# Patient Record
Sex: Female | Born: 1940 | Race: White | Hispanic: No | Marital: Married | Smoking: Never smoker
Health system: Southern US, Community
[De-identification: ages and names within clinical notes are randomized; demographics above are authoritative.]

## PROBLEM LIST (undated history)

## (undated) DIAGNOSIS — R519 Headache, unspecified: Secondary | ICD-10-CM

## (undated) DIAGNOSIS — R51 Headache: Secondary | ICD-10-CM

## (undated) DIAGNOSIS — Z801 Family history of malignant neoplasm of trachea, bronchus and lung: Secondary | ICD-10-CM

## (undated) DIAGNOSIS — F32A Depression, unspecified: Secondary | ICD-10-CM

## (undated) DIAGNOSIS — Z9221 Personal history of antineoplastic chemotherapy: Secondary | ICD-10-CM

## (undated) DIAGNOSIS — F329 Major depressive disorder, single episode, unspecified: Secondary | ICD-10-CM

## (undated) DIAGNOSIS — C50919 Malignant neoplasm of unspecified site of unspecified female breast: Secondary | ICD-10-CM

## (undated) DIAGNOSIS — I4891 Unspecified atrial fibrillation: Secondary | ICD-10-CM

## (undated) DIAGNOSIS — R32 Unspecified urinary incontinence: Secondary | ICD-10-CM

## (undated) DIAGNOSIS — I82409 Acute embolism and thrombosis of unspecified deep veins of unspecified lower extremity: Secondary | ICD-10-CM

## (undated) DIAGNOSIS — K219 Gastro-esophageal reflux disease without esophagitis: Secondary | ICD-10-CM

## (undated) DIAGNOSIS — K5790 Diverticulosis of intestine, part unspecified, without perforation or abscess without bleeding: Secondary | ICD-10-CM

## (undated) DIAGNOSIS — M5030 Other cervical disc degeneration, unspecified cervical region: Secondary | ICD-10-CM

## (undated) DIAGNOSIS — E079 Disorder of thyroid, unspecified: Secondary | ICD-10-CM

## (undated) DIAGNOSIS — F419 Anxiety disorder, unspecified: Secondary | ICD-10-CM

## (undated) DIAGNOSIS — Z800 Family history of malignant neoplasm of digestive organs: Secondary | ICD-10-CM

## (undated) DIAGNOSIS — E049 Nontoxic goiter, unspecified: Secondary | ICD-10-CM

## (undated) DIAGNOSIS — R0600 Dyspnea, unspecified: Secondary | ICD-10-CM

## (undated) DIAGNOSIS — M810 Age-related osteoporosis without current pathological fracture: Secondary | ICD-10-CM

## (undated) DIAGNOSIS — G4733 Obstructive sleep apnea (adult) (pediatric): Secondary | ICD-10-CM

## (undated) DIAGNOSIS — R42 Dizziness and giddiness: Secondary | ICD-10-CM

## (undated) DIAGNOSIS — N390 Urinary tract infection, site not specified: Secondary | ICD-10-CM

## (undated) DIAGNOSIS — I10 Essential (primary) hypertension: Secondary | ICD-10-CM

## (undated) DIAGNOSIS — C50911 Malignant neoplasm of unspecified site of right female breast: Secondary | ICD-10-CM

## (undated) DIAGNOSIS — G709 Myoneural disorder, unspecified: Secondary | ICD-10-CM

## (undated) HISTORY — DX: Dizziness and giddiness: R42

## (undated) HISTORY — DX: Age-related osteoporosis without current pathological fracture: M81.0

## (undated) HISTORY — PX: BUNIONECTOMY: SHX129

## (undated) HISTORY — PX: CHOLECYSTECTOMY: SHX55

## (undated) HISTORY — DX: Disorder of thyroid, unspecified: E07.9

## (undated) HISTORY — PX: THYROIDECTOMY: SHX17

## (undated) HISTORY — PX: HAND SURGERY: SHX662

## (undated) HISTORY — PX: COCHLEAR IMPLANT: SUR684

## (undated) HISTORY — DX: Diverticulosis of intestine, part unspecified, without perforation or abscess without bleeding: K57.90

## (undated) HISTORY — PX: PARTIAL COLECTOMY: SHX5273

## (undated) HISTORY — DX: Myoneural disorder, unspecified: G70.9

## (undated) HISTORY — DX: Family history of malignant neoplasm of digestive organs: Z80.0

## (undated) HISTORY — DX: Urinary tract infection, site not specified: N39.0

## (undated) HISTORY — DX: Gastro-esophageal reflux disease without esophagitis: K21.9

## (undated) HISTORY — PX: ABDOMINAL HYSTERECTOMY: SHX81

## (undated) HISTORY — DX: Nontoxic goiter, unspecified: E04.9

## (undated) HISTORY — DX: Unspecified atrial fibrillation: I48.91

## (undated) HISTORY — PX: EYE SURGERY: SHX253

## (undated) HISTORY — PX: FOOT SURGERY: SHX648

## (undated) HISTORY — DX: Family history of malignant neoplasm of trachea, bronchus and lung: Z80.1

## (undated) HISTORY — DX: Obstructive sleep apnea (adult) (pediatric): G47.33

## (undated) MED FILL — Zoledronic Acid IV Soln 5 MG/100ML: INTRAVENOUS | Qty: 100 | Status: AC

---
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
## 1997-03-13 DIAGNOSIS — Z9221 Personal history of antineoplastic chemotherapy: Secondary | ICD-10-CM

## 1997-03-13 DIAGNOSIS — C50911 Malignant neoplasm of unspecified site of right female breast: Secondary | ICD-10-CM

## 1997-03-13 DIAGNOSIS — C50919 Malignant neoplasm of unspecified site of unspecified female breast: Secondary | ICD-10-CM

## 1997-03-13 HISTORY — DX: Malignant neoplasm of unspecified site of unspecified female breast: C50.919

## 1997-03-13 HISTORY — PX: MASTECTOMY: SHX3

## 1997-03-13 HISTORY — DX: Personal history of antineoplastic chemotherapy: Z92.21

## 1997-03-13 HISTORY — DX: Malignant neoplasm of unspecified site of right female breast: C50.911

## 1998-03-13 DIAGNOSIS — Z9221 Personal history of antineoplastic chemotherapy: Secondary | ICD-10-CM

## 1998-03-13 HISTORY — DX: Personal history of antineoplastic chemotherapy: Z92.21

## 2003-12-12 ENCOUNTER — Ambulatory Visit: Payer: Self-pay | Admitting: Oncology

## 2004-03-13 HISTORY — PX: BREAST BIOPSY: SHX20

## 2004-04-10 ENCOUNTER — Emergency Department: Payer: Self-pay | Admitting: Unknown Physician Specialty

## 2004-04-22 ENCOUNTER — Ambulatory Visit: Payer: Self-pay | Admitting: General Surgery

## 2004-04-29 ENCOUNTER — Ambulatory Visit: Payer: Self-pay | Admitting: Psychiatry

## 2004-05-02 ENCOUNTER — Ambulatory Visit: Payer: Self-pay | Admitting: General Surgery

## 2004-07-08 ENCOUNTER — Ambulatory Visit: Payer: Self-pay | Admitting: Oncology

## 2004-07-11 ENCOUNTER — Ambulatory Visit: Payer: Self-pay | Admitting: Oncology

## 2004-12-05 ENCOUNTER — Ambulatory Visit: Payer: Self-pay | Admitting: General Surgery

## 2005-01-12 ENCOUNTER — Ambulatory Visit: Payer: Self-pay | Admitting: Oncology

## 2005-05-22 ENCOUNTER — Ambulatory Visit: Payer: Self-pay | Admitting: Internal Medicine

## 2005-06-12 ENCOUNTER — Ambulatory Visit: Payer: Self-pay | Admitting: General Surgery

## 2005-07-08 ENCOUNTER — Ambulatory Visit: Payer: Self-pay | Admitting: Internal Medicine

## 2005-08-18 ENCOUNTER — Ambulatory Visit: Payer: Self-pay | Admitting: Oncology

## 2006-02-16 ENCOUNTER — Ambulatory Visit: Payer: Self-pay | Admitting: Oncology

## 2006-04-16 ENCOUNTER — Inpatient Hospital Stay: Payer: Self-pay | Admitting: Internal Medicine

## 2006-06-13 ENCOUNTER — Ambulatory Visit: Payer: Self-pay | Admitting: General Surgery

## 2006-07-09 ENCOUNTER — Ambulatory Visit: Payer: Self-pay | Admitting: General Surgery

## 2006-07-25 ENCOUNTER — Encounter: Payer: Self-pay | Admitting: General Surgery

## 2007-03-14 ENCOUNTER — Ambulatory Visit: Payer: Self-pay | Admitting: Oncology

## 2007-03-28 ENCOUNTER — Ambulatory Visit: Payer: Self-pay | Admitting: Oncology

## 2007-04-14 ENCOUNTER — Ambulatory Visit: Payer: Self-pay | Admitting: Oncology

## 2007-06-14 ENCOUNTER — Ambulatory Visit: Payer: Self-pay | Admitting: Internal Medicine

## 2008-04-01 ENCOUNTER — Ambulatory Visit: Payer: Self-pay | Admitting: Ophthalmology

## 2008-04-14 ENCOUNTER — Ambulatory Visit: Payer: Self-pay | Admitting: Ophthalmology

## 2008-05-26 ENCOUNTER — Ambulatory Visit: Payer: Self-pay | Admitting: Ophthalmology

## 2008-06-18 ENCOUNTER — Ambulatory Visit: Payer: Self-pay | Admitting: Internal Medicine

## 2008-06-25 ENCOUNTER — Ambulatory Visit: Payer: Self-pay

## 2008-06-25 ENCOUNTER — Inpatient Hospital Stay: Payer: Self-pay | Admitting: Internal Medicine

## 2008-09-22 ENCOUNTER — Emergency Department: Payer: Self-pay | Admitting: Unknown Physician Specialty

## 2008-10-02 ENCOUNTER — Inpatient Hospital Stay: Payer: Self-pay | Admitting: Internal Medicine

## 2008-10-10 ENCOUNTER — Ambulatory Visit: Payer: Self-pay | Admitting: Unknown Physician Specialty

## 2008-10-31 ENCOUNTER — Emergency Department: Payer: Self-pay | Admitting: Emergency Medicine

## 2008-11-10 ENCOUNTER — Ambulatory Visit: Payer: Self-pay | Admitting: Internal Medicine

## 2009-01-22 ENCOUNTER — Ambulatory Visit: Payer: Self-pay | Admitting: Orthopedic Surgery

## 2009-01-25 ENCOUNTER — Ambulatory Visit: Payer: Self-pay | Admitting: Orthopedic Surgery

## 2009-06-08 ENCOUNTER — Ambulatory Visit: Payer: Self-pay | Admitting: Urology

## 2009-07-06 ENCOUNTER — Ambulatory Visit: Payer: Self-pay | Admitting: Internal Medicine

## 2009-09-27 ENCOUNTER — Inpatient Hospital Stay: Payer: Self-pay | Admitting: Internal Medicine

## 2009-09-29 ENCOUNTER — Inpatient Hospital Stay: Payer: Self-pay | Admitting: Psychiatry

## 2010-02-01 ENCOUNTER — Ambulatory Visit: Payer: Self-pay | Admitting: Urology

## 2010-05-22 ENCOUNTER — Emergency Department: Payer: Self-pay | Admitting: Emergency Medicine

## 2010-07-12 ENCOUNTER — Ambulatory Visit: Payer: Self-pay | Admitting: Internal Medicine

## 2010-09-12 ENCOUNTER — Emergency Department: Payer: Self-pay | Admitting: Emergency Medicine

## 2011-02-03 ENCOUNTER — Ambulatory Visit: Payer: Self-pay | Admitting: Internal Medicine

## 2011-03-23 ENCOUNTER — Ambulatory Visit: Payer: Self-pay | Admitting: Unknown Physician Specialty

## 2011-03-27 LAB — PATHOLOGY REPORT

## 2011-04-02 ENCOUNTER — Emergency Department: Payer: Self-pay | Admitting: Unknown Physician Specialty

## 2011-04-02 LAB — CBC WITH DIFFERENTIAL/PLATELET
Basophil #: 0 10*3/uL (ref 0.0–0.1)
Basophil %: 0.5 %
Eosinophil #: 0.1 10*3/uL (ref 0.0–0.7)
HCT: 39.4 % (ref 35.0–47.0)
HGB: 13.3 g/dL (ref 12.0–16.0)
Lymphocyte #: 2.3 10*3/uL (ref 1.0–3.6)
MCH: 28.6 pg (ref 26.0–34.0)
MCHC: 33.7 g/dL (ref 32.0–36.0)
Monocyte #: 0.7 10*3/uL (ref 0.0–0.7)
Neutrophil #: 4.4 10*3/uL (ref 1.4–6.5)

## 2011-04-02 LAB — COMPREHENSIVE METABOLIC PANEL
Anion Gap: 10 (ref 7–16)
BUN: 13 mg/dL (ref 7–18)
Bilirubin,Total: 0.4 mg/dL (ref 0.2–1.0)
Calcium, Total: 8.9 mg/dL (ref 8.5–10.1)
Chloride: 106 mmol/L (ref 98–107)
Co2: 24 mmol/L (ref 21–32)
Creatinine: 0.74 mg/dL (ref 0.60–1.30)
EGFR (African American): >60
Potassium: 4.3 mmol/L (ref 3.5–5.1)
SGOT(AST): 24 U/L (ref 15–37)
SGPT (ALT): 24 U/L
Sodium: 140 mmol/L (ref 136–145)
Total Protein: 7 g/dL (ref 6.4–8.2)

## 2011-04-02 LAB — URINALYSIS, COMPLETE
Bacteria: NONE SEEN
Blood: NEGATIVE
Glucose,UR: NEGATIVE mg/dL (ref 0–75)
Ketone: NEGATIVE
Protein: NEGATIVE
Specific Gravity: 1.016 (ref 1.003–1.030)
WBC UR: 1 /HPF (ref 0–5)

## 2011-04-02 LAB — MAGNESIUM: Magnesium: 1.8 mg/dL

## 2011-04-02 LAB — LIPASE, BLOOD: Lipase: 76 U/L (ref 73–393)

## 2011-07-13 ENCOUNTER — Ambulatory Visit: Payer: Self-pay | Admitting: Internal Medicine

## 2011-08-24 ENCOUNTER — Ambulatory Visit: Payer: Self-pay | Admitting: Physical Medicine and Rehabilitation

## 2011-11-16 DIAGNOSIS — R351 Nocturia: Secondary | ICD-10-CM | POA: Insufficient documentation

## 2011-11-16 DIAGNOSIS — M5430 Sciatica, unspecified side: Secondary | ICD-10-CM | POA: Insufficient documentation

## 2011-11-16 DIAGNOSIS — N8111 Cystocele, midline: Secondary | ICD-10-CM | POA: Insufficient documentation

## 2011-11-16 DIAGNOSIS — N393 Stress incontinence (female) (male): Secondary | ICD-10-CM | POA: Insufficient documentation

## 2011-11-16 DIAGNOSIS — N3642 Intrinsic sphincter deficiency (ISD): Secondary | ICD-10-CM | POA: Insufficient documentation

## 2011-11-16 DIAGNOSIS — R339 Retention of urine, unspecified: Secondary | ICD-10-CM | POA: Insufficient documentation

## 2011-11-16 DIAGNOSIS — N3941 Urge incontinence: Secondary | ICD-10-CM | POA: Insufficient documentation

## 2012-03-19 ENCOUNTER — Ambulatory Visit: Payer: Self-pay | Admitting: Internal Medicine

## 2012-07-15 ENCOUNTER — Ambulatory Visit: Payer: Self-pay | Admitting: Internal Medicine

## 2012-10-12 ENCOUNTER — Emergency Department: Payer: Self-pay | Admitting: Unknown Physician Specialty

## 2012-10-12 LAB — URINALYSIS, COMPLETE
Blood: NEGATIVE
Glucose,UR: NEGATIVE mg/dL (ref 0–75)
Ketone: NEGATIVE
Leukocyte Esterase: NEGATIVE
Ph: 9 (ref 4.5–8.0)
RBC,UR: <1 /HPF (ref 0–5)

## 2012-10-12 LAB — CBC
HCT: 43 % (ref 35.0–47.0)
MCH: 28.5 pg (ref 26.0–34.0)
MCHC: 34.7 g/dL (ref 32.0–36.0)
Platelet: 263 10*3/uL (ref 150–440)
RDW: 13.3 % (ref 11.5–14.5)

## 2012-10-12 LAB — COMPREHENSIVE METABOLIC PANEL
Albumin: 3.9 g/dL (ref 3.4–5.0)
Alkaline Phosphatase: 89 U/L (ref 50–136)
Anion Gap: 8 (ref 7–16)
BUN: 9 mg/dL (ref 7–18)
Bilirubin,Total: 0.8 mg/dL (ref 0.2–1.0)
Calcium, Total: 9.2 mg/dL (ref 8.5–10.1)
Chloride: 108 mmol/L — ABNORMAL HIGH (ref 98–107)
Co2: 25 mmol/L (ref 21–32)
Creatinine: 0.79 mg/dL (ref 0.60–1.30)
EGFR (African American): >60
EGFR (Non-African Amer.): >60
Glucose: 111 mg/dL — ABNORMAL HIGH (ref 65–99)
Osmolality: 281 (ref 275–301)
Sodium: 141 mmol/L (ref 136–145)

## 2012-10-12 LAB — MAGNESIUM: Magnesium: 1.8 mg/dL

## 2012-11-09 ENCOUNTER — Observation Stay: Payer: Self-pay | Admitting: Internal Medicine

## 2012-11-09 LAB — URINALYSIS, COMPLETE
Bilirubin,UR: NEGATIVE
Blood: NEGATIVE
Glucose,UR: NEGATIVE mg/dL (ref 0–75)
Ketone: NEGATIVE
Leukocyte Esterase: NEGATIVE
Protein: NEGATIVE
RBC,UR: NONE SEEN /HPF (ref 0–5)
Specific Gravity: 1.012 (ref 1.003–1.030)
Squamous Epithelial: NONE SEEN
WBC UR: <1 /HPF (ref 0–5)

## 2012-11-09 LAB — HEPATIC FUNCTION PANEL A (ARMC)
Albumin: 3.4 g/dL (ref 3.4–5.0)
Alkaline Phosphatase: 78 U/L (ref 50–136)
Bilirubin, Direct: <0.1 mg/dL (ref 0.00–0.20)
SGOT(AST): 27 U/L (ref 15–37)
SGPT (ALT): 21 U/L (ref 12–78)
Total Protein: 6.9 g/dL (ref 6.4–8.2)

## 2012-11-09 LAB — BASIC METABOLIC PANEL
BUN: 13 mg/dL (ref 7–18)
Co2: 24 mmol/L (ref 21–32)
EGFR (Non-African Amer.): >60
Glucose: 108 mg/dL — ABNORMAL HIGH (ref 65–99)
Sodium: 140 mmol/L (ref 136–145)

## 2012-11-09 LAB — TSH: Thyroid Stimulating Horm: 0.275 u[IU]/mL — ABNORMAL LOW

## 2012-11-09 LAB — DIFFERENTIAL
Basophil #: 0.1 10*3/uL (ref 0.0–0.1)
Basophil %: 0.9 %
Eosinophil %: 1.9 %
Lymphocyte #: 2.8 10*3/uL (ref 1.0–3.6)
Monocyte #: 0.7 x10 3/mm (ref 0.2–0.9)
Neutrophil #: 4.1 10*3/uL (ref 1.4–6.5)
Neutrophil %: 51.8 %

## 2012-11-09 LAB — CBC
HCT: 38.4 % (ref 35.0–47.0)
HGB: 13.3 g/dL (ref 12.0–16.0)
MCHC: 34.6 g/dL (ref 32.0–36.0)
MCV: 82 fL (ref 80–100)
Platelet: 247 10*3/uL (ref 150–440)
RDW: 13.5 % (ref 11.5–14.5)
WBC: 7.8 10*3/uL (ref 3.6–11.0)

## 2012-11-10 ENCOUNTER — Ambulatory Visit: Payer: Self-pay | Admitting: Neurology

## 2012-11-12 LAB — BASIC METABOLIC PANEL
Chloride: 106 mmol/L (ref 98–107)
Creatinine: 0.78 mg/dL (ref 0.60–1.30)
Glucose: 106 mg/dL — ABNORMAL HIGH (ref 65–99)
Potassium: 3.4 mmol/L — ABNORMAL LOW (ref 3.5–5.1)
Sodium: 140 mmol/L (ref 136–145)

## 2012-11-12 LAB — PLATELET COUNT: Platelet: 233 10*3/uL (ref 150–440)

## 2012-11-14 LAB — STOOL CULTURE

## 2012-11-16 LAB — PLATELET COUNT: Platelet: 238 10*3/uL (ref 150–440)

## 2013-08-04 ENCOUNTER — Emergency Department: Payer: Self-pay | Admitting: Emergency Medicine

## 2013-08-04 LAB — URINALYSIS, COMPLETE
BACTERIA: NONE SEEN
Bilirubin,UR: NEGATIVE
Blood: NEGATIVE
GLUCOSE, UR: NEGATIVE mg/dL (ref 0–75)
Ketone: NEGATIVE
LEUKOCYTE ESTERASE: NEGATIVE
Nitrite: NEGATIVE
PH: 6 (ref 4.5–8.0)
Protein: NEGATIVE
Specific Gravity: 1.005 (ref 1.003–1.030)
Squamous Epithelial: <1
WBC UR: 2 /HPF (ref 0–5)

## 2013-08-04 LAB — COMPREHENSIVE METABOLIC PANEL
ALBUMIN: 3.6 g/dL (ref 3.4–5.0)
ALT: 21 U/L (ref 12–78)
ANION GAP: 13 (ref 7–16)
Alkaline Phosphatase: 76 U/L
BILIRUBIN TOTAL: 0.6 mg/dL (ref 0.2–1.0)
BUN: 13 mg/dL (ref 7–18)
CALCIUM: 8.8 mg/dL (ref 8.5–10.1)
CREATININE: 0.84 mg/dL (ref 0.60–1.30)
Chloride: 108 mmol/L — ABNORMAL HIGH (ref 98–107)
Co2: 19 mmol/L — ABNORMAL LOW (ref 21–32)
EGFR (Non-African Amer.): >60
Glucose: 131 mg/dL — ABNORMAL HIGH (ref 65–99)
Osmolality: 281 (ref 275–301)
Potassium: 3.7 mmol/L (ref 3.5–5.1)
SGOT(AST): 23 U/L (ref 15–37)
Sodium: 140 mmol/L (ref 136–145)
Total Protein: 7.1 g/dL (ref 6.4–8.2)

## 2013-08-04 LAB — CBC
HCT: 42.3 % (ref 35.0–47.0)
HGB: 14 g/dL (ref 12.0–16.0)
MCH: 29 pg (ref 26.0–34.0)
MCHC: 33.2 g/dL (ref 32.0–36.0)
MCV: 87 fL (ref 80–100)
Platelet: 233 10*3/uL (ref 150–440)
RBC: 4.84 10*6/uL (ref 3.80–5.20)
RDW: 13.5 % (ref 11.5–14.5)
WBC: 8.3 10*3/uL (ref 3.6–11.0)

## 2013-08-04 LAB — TROPONIN I: Troponin-I: <0.02 ng/mL

## 2013-08-04 LAB — PRO B NATRIURETIC PEPTIDE: B-Type Natriuretic Peptide: 53 pg/mL (ref 0–125)

## 2013-09-23 ENCOUNTER — Ambulatory Visit: Payer: Self-pay | Admitting: Internal Medicine

## 2013-10-06 DIAGNOSIS — M179 Osteoarthritis of knee, unspecified: Secondary | ICD-10-CM | POA: Insufficient documentation

## 2013-10-06 DIAGNOSIS — M5416 Radiculopathy, lumbar region: Secondary | ICD-10-CM | POA: Insufficient documentation

## 2013-10-06 DIAGNOSIS — M5136 Other intervertebral disc degeneration, lumbar region: Secondary | ICD-10-CM | POA: Insufficient documentation

## 2013-10-06 DIAGNOSIS — M5116 Intervertebral disc disorders with radiculopathy, lumbar region: Secondary | ICD-10-CM | POA: Insufficient documentation

## 2013-10-06 DIAGNOSIS — M1710 Unilateral primary osteoarthritis, unspecified knee: Secondary | ICD-10-CM | POA: Insufficient documentation

## 2014-04-16 DIAGNOSIS — R42 Dizziness and giddiness: Secondary | ICD-10-CM | POA: Insufficient documentation

## 2014-04-22 ENCOUNTER — Ambulatory Visit: Payer: Self-pay | Admitting: Internal Medicine

## 2014-06-22 DIAGNOSIS — N390 Urinary tract infection, site not specified: Secondary | ICD-10-CM | POA: Insufficient documentation

## 2014-06-22 DIAGNOSIS — R42 Dizziness and giddiness: Secondary | ICD-10-CM | POA: Insufficient documentation

## 2014-06-22 DIAGNOSIS — F32A Depression, unspecified: Secondary | ICD-10-CM | POA: Insufficient documentation

## 2014-06-22 DIAGNOSIS — F329 Major depressive disorder, single episode, unspecified: Secondary | ICD-10-CM | POA: Insufficient documentation

## 2014-06-22 DIAGNOSIS — C50919 Malignant neoplasm of unspecified site of unspecified female breast: Secondary | ICD-10-CM | POA: Insufficient documentation

## 2014-06-22 DIAGNOSIS — I10 Essential (primary) hypertension: Secondary | ICD-10-CM | POA: Insufficient documentation

## 2014-06-22 DIAGNOSIS — G4730 Sleep apnea, unspecified: Secondary | ICD-10-CM | POA: Insufficient documentation

## 2014-06-22 DIAGNOSIS — K219 Gastro-esophageal reflux disease without esophagitis: Secondary | ICD-10-CM | POA: Insufficient documentation

## 2014-07-03 NOTE — Consult Note (Signed)
PATIENT NAME:  Breanna Zhang, HAUBNER MR#:  [id number] DATE OF BIRTH:  1940-08-19  DATE OF CONSULTATION:  11/10/2012  CONSULTING PHYSICIAN:  Leotis Pain, MD  REASON FOR NEUROLOGICAL CONSULTATION: Status post syncopal episode and tremors, rule out seizure activity.   HISTORY OF PRESENT ILLNESS: Obtained from patient's chart, patient herself, and patient's husband, who is at bedside. This is a 74 year old female with past medical history of diverticulitis by history of colonic resection, recurrent episodes of dizziness, occasional chest pain, suspected to be anxiety-provoking, came in with what is suspected to be syncopal episode. For the past 4 days or so, patient has been having increased amount of liquidy stools. Yesterday morning after a bowel movement, patient stood up and, as per husband, her legs gave out and  patient was on the floor. Had difficulty getting up. It was unclear if there was any loss of consciousness, but patient's husband does state that he thinks she was awake, but just was too weak to stand up. There was some minimal jerking motion of both of her hands and legs, but it was not consistent. There was no postictal state. No tongue biting. No urinary incontinence. No further suspicion of seizure activity. Upon admission to the Emergency Department, patient received IV hydration and she appears to be currently back to her baseline.   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   HOME MEDICATIONS: Include alprazolam, amlodipine, atorvastatin, Synthroid.   PAST SURGICAL HISTORY: Resection of the colonic mass, cochlear implants, pubovaginal sling, history of bladder cancer, thyroidectomy, cholecystectomy.   ALLERGIES:  Include PENICILLIN.  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  FAMILY HISTORY:  Contributive for cardiac disease, colon cancer, lung cancer and diabetes.   SOCIAL HISTORY: She is married, lives with her husband.  REVIEW OF SYSTEMS: Generalized weakness. Positive for diffuse pain. No history of glaucoma, cataracts. No shortness of breath. No chest pain. Mild diffuse abdominal pain. No weakness on one side of the body compared to the other. Positive history for anxiety and depression. Positive history for panic attacks. No heat or cold intolerance   LABS:  The patient's laboratory workup includes a UA that was negative for urinary tract infection. Her LFTs are unremarkable. White blood cell count is 7.8, hemoglobin 13.3, hematocrit 38.4, platelet count is 247. Glucose 108, BUN 13, creatinine is 0.84, sodium is 140, potassium is 3.8, chloride 109. Troponin is 0.2.   PHYSICAL EXAMINATION: VITAL SIGNS: Temperature 98.7, pulse 79, respirations 18, blood pressure 109/68, pulse ox  is 96%.   NEUROLOGIC: The patient was able to tell me the place, time, and her husband's name. Cranial nerve examination: Extraocular movements intact. Pupils 3 to 2, reactive bilaterally. Facial sensation intact, even though there were some anxiety-provoking facial movements similar to tardive dyskinesia. Tongue is midline. Uvula elevates symmetrically. Shoulder shrug intact. Motor appears to be 5 out of 5 bilateral upper and lower extremities. Sensation is intact to light touch and temperature. Reflexes intact. Coordination: Finger-to-nose intact. Gait not assessed.   IMPRESSION:  A 74 year old female with chronic history of anxiety, depression, panic attacks, presenting with a 4-day history of increased liquidy bowel movements, status post what appears to be a syncopal event and some tremor. As per husband, patient has frequent tremor of her upper extremities, specifically  when she appears to be more anxious and  around the times of her panic attacks. There is no focal neurological weakness.   PLAN:  Continue hydration, p.r.n. meclizine if needed for dizziness. Her diarrhea is improving. Continue hydration. Do not believe this is seizure activity. Would not do an EEG. No further neurological testing. No antiepileptic drugs at this time. I believe the tremor that patient sustains on and off is anxiety-related, related to her panic attacks. She should follow up with Psychiatry as an outpatient.   Thank you, it was a pleasure seeing this patient.    ____________________________ Leotis Pain, MD yz:mr D: 11/10/2012 12:43:38 ET T: 11/10/2012 19:38:21 ET JOB#: [id number]  cc: Leotis Pain, MD, <Dictator> Leotis Pain MD ELECTRONICALLY SIGNED 11/25/2012 12:12

## 2014-07-03 NOTE — H&P (Signed)
PATIENT NAME:  Breanna Zhang, Breanna Zhang MR#:  [id number] DATE OF BIRTH:  [birth year]  DATE OF ADMISSION:  11/09/2012  The patient is being admitted under observation status.   CHIEF COMPLAINT: Dizziness with syncope and diarrhea.   HISTORY OF PRESENT ILLNESS: Ms. Teters is a 74 year old white female with a history of diverticulitis and prior colonic resection, recurrent episodes of dizziness and chest pain with prior work-up, who presented today with a several week history of vague left upper quadrant pain and increased bowel movements of an explosive liquid nature. The patient states that for the last 3 to 4 days, she has been having 4 to 5 liquid stools per day, which have left her very weak. She states her last stool was this morning and afterwards, she felt too weak to walk without assistance. She had her husband assist her in coming back into the house after being outside for a few minutes and had a witnessed syncopal event. The patient's husband is present and stated her legs went out from under her and she fell to the carpet and was unresponsive for about 10 minutes, until EMS arrived and put some oxygen on her and then she woke up very readily and was not confused. He did see some jerking motion of both her hands and legs but nothing that suggested seizure activity, specifically, no loss of bowel or bladder function. The patient was given a liter of IV fluids in the ED and found to be too weak to sit up without assistance. This was attempted during an attempted trial of orthostatics but again, the patient was too weak to sit up without maximal assistance.   PRIMARY CARE PHYSICIAN: Dr. Caryl Comes.   PAST MEDICAL HISTORY: 1.  History of recurrent chest pain and dizziness, with prior cardiology work-up negative.  2.  History of DVT secondary to lower extremity injury.  3.  History of GERD.  4.  Hypothyroidism.  5.  History of generalized anxiety and depression, with admission to behavioral health in 2011 for  suicidal ideation.  6.  Hearing loss, status post cochlear implant.   CURRENT MEDICATIONS: Alprazolam 0.25 mg 1 to 2 tablets once a day, amlodipine 5 mg daily, atorvastatin 40 mg in the evening, B12 at 500 mcg sublingual daily, duloxetine 60 mg daily, pantoprazole 40 mg twice daily, Synthroid 100 mcg once daily.   PAST SURGICAL HISTORY: Includes: 1.  Resection of a colonic mass, which turned out to be a diverticulum.  2.  Cochlear implant.  3.  Pubovaginal sling for stress urinary incontinence; this was in 2011.  4.  History of breast cancer, status post right mastectomy in 1999.  5.  Thyroidectomy.  6.  Cholecystectomy.   ALLERGIES: PENICILLIN WHICH CAUSES A RASH, LATEX AND TAPE WHICH CAUSE BLISTERS AND RASH, AND IMIPRAMINE WHICH CAUSES SORES IN HER MOUTH AND TINNITUS.   LAST HOSPITALIZATION: July 2011 for behavioral health transfer for suicidal ideation with psychosis.   FAMILY HISTORY: She has a brother who died early of heart disease. There is a family history of colon cancer as well lung cancer and diabetes.   SOCIAL HISTORY: She is married. She has no history of alcohol or tobacco abuse.   REVIEW OF SYSTEMS: Positive for pain, fatigue and weakness lasting several weeks. No visual changes. No history of glaucoma or cataracts. No history of seasonal rhinitis, snoring, postnasal drip or sinus pain. No history of cough, wheezing, hemoptysis or dyspnea. No painful respirations. She reports palpable substernal chest pain and left upper  quadrant pain. No dyspnea on exertion. No palpitations. Positive syncope per HPI. She has had diarrhea and abdominal pain without nausea or vomiting. She has had a change in bowel habits per HPI. No dysuria, hematuria or incontinence. She is status post pubovaginal sling for incontinence. She does have a history of thyroid problems with prior thyroidectomy. She has no heat or cold intolerance. She has no chronic neck, back or knee pain, but does report left hip  pain which limits her ability to flex hip. She has a history of vertigo and headache along with weakness. She has a history of anxiety and insomnia. Recent events may have been triggered by the recent hospitalization of her daughter.   PHYSICAL EXAMINATION:  GENERAL: This is a well-nourished elderly female in no apparent distress.  VITAL SIGNS: Initial blood pressure 163/77, repeat 123/68. Pulse is 70 and regular. Respirations are 20. She is satting 100% on room air and temperature is 98.8.  HEENT: Pupils are equal, round and reactive to light. Sclerae are anicteric. Oropharynx is benign.  NECK: Supple without lymphadenopathy, JVD, thyromegaly or carotid bruits.  LUNGS: Clear to auscultation bilaterally with no wheezing or rhonchi.  CARDIOVASCULAR: Regular rate and rhythm. She is tender in her chest wall with no radiation. Pedal pulses are palpable.  ABDOMEN: Soft. She is tender in both lower quadrants with minimal palpation. There is some guarding but no rebound. Bowel sounds are normal.  MUSCULOSKELETAL: She has 4+/5 strength in the left lower leg flexors due to hip pain. She has 4+/5 strength in the left ankle with regard to dorsiflexion. She has limited range of motion in that leg.  SKIN: Skin is warm and dry without rashes or lesions.  LYMPH: No cervical, axillary, inguinal or supraclavicular lymphadenopathy.  NEUROLOGICAL: She has intact sensation, intact deep tendon reflexes. There is no dysarthria or aphasia.  PSYCHIATRIC: She is alert and oriented to person, place and time. She is cooperative but does seem anxious.   ADMISSION LABORATORY AND RADIOLOGICAL DATA: Sodium is 140, potassium 3.8, chloride 109, bicarbonate 24, BUN 13, creatinine 0.84, glucose 108. Troponin I is less than 0.02. Liver function tests are normal. TSH is suppressed at 0.275. CT of the head shows metallic beam hardening artifact from her cochlear implant over the right ear. Ventricles are normal in size. No evidence of  acute intracranial hemorrhage. PA and lateral chest x-ray shows no evidence of pneumonia or CHF.   ASSESSMENT AND PLAN: 1.  Syncope: In the setting of increased volume of loose stools, I suspect mild dehydration is the cause. Secondary to diverticulitis. Will admit for continued IV fluids and evaluation of abdominal pain with CT of the abdomen and pelvis with contrast.  2.  Diarrhea: Presumed secondary to diverticulitis. She has not been on antibiotics recently. Stool studies have been ordered. Empiric antibiotics are held at this point pending evaluation with CT scan.  3.  Suppressed TSH: We will reduce her Synthroid from 100 mcg to 88 mcg. She will need a repeat TSH in 6 weeks. The patient is being again admitted to Dr. Olin Pia practice under observation status.   CODE STATUS: She is full code.   ESTIMATED TIME OF CARE: 60 minutes.   ____________________________ Deborra Medina, MD tt:jm D: 11/09/2012 15:51:34 ET T: 11/09/2012 16:40:15 ET JOB#: [id number]  cc: Deborra Medina, MD, <Dictator> Deborra Medina MD ELECTRONICALLY SIGNED 11/12/2012 9:02

## 2014-07-03 NOTE — Discharge Summary (Signed)
PATIENT NAME:  Breanna Zhang, Breanna Zhang MR#:  [id number] DATE OF BIRTH:  05-21-1940  DATE OF ADMISSION:  11/09/2012 DATE OF DISCHARGE: 11/15/2012   FINAL DIAGNOSES:  1.  Dizziness and syncope secondary to orthostatic hypotension from dehydration and medication.  2.  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  3.  Severe anxiety and depression with prior Behavioral Medicine hospitalization.  4.  History of remote deep vein thrombosis secondary to lower extremity injury.  5.  Gastroesophageal reflux disease.  6  Hypothyroidism.  7.  Hearing loss with cochlear implant.  8.  History of resection of colonic mass.  9.  Pubovaginal sling for urinary incontinence in 2011.  10. History of breast cancer with right mastectomy in 1999.  11. Status post cholecystectomy.  12. Status post thyroidectomy.   HISTORY AND PHYSICAL: Please see dictated admission history and physical.    [place]: The patient was admitted with episodes of dizziness and syncope versus near syncope. Evaluation of syncope was unremarkable other than finding orthostasis and some mild dehydration that was corrected with IV fluids and holding her amlodipine.   She was seen by neurology and psychiatry. She also was exhibiting extreme tremor when she was sat forward or when she tried to stand up. Neurology felt that the tremor was likely psychogenic in nature. Psychiatry made some recommendations for medication changes and these were performed, including adding Ambien for sleep, continuing alprazolam for anxiety and increasing Cymbalta. With this regimen, there was improvement.   Physical therapy ambulated the patient. Initially, she would only go about 8 feet with significant balance issues. She did improve in her ambulation, but the distance was well less than 50 feet with multiple episodes of stumbling and it became clear that she would be unsafe to return home given her current ambulation  difficulties. After discussion with the patient, she was willing to go to short-term rehabilitation and a bed search began. A bed became available and the patient transferred to that facility in stable condition with physical activity to be up as tolerated with assistance. Her diet will be regular. She should follow up with the nursing home physician. Occupational therapy and physical therapy should evaluate and treat the patient. Orthostatic blood pressure should be checked each morning before starting exercise.  DISCHARGE MEDICATIONS:  1.  Synthroid 0.1 mg p.o. daily.  2.  Atorvastatin 40 mg p.o. at bedtime.  3.  Pantoprazole 40 mg p.o. b.i.d.  4.  Vitamin B12 500 mcg sublingual once a day.  5.  Duloxetine 60 mg p.o. daily.  6.  Meclizine 12.5 mg p.o. q.6 hours as needed for vertigo.  7.  Xanax 0.25 mg p.o. q.6 hours as needed for anxiety.  8.  Ambien 5 mg p.o. at bedtime as needed for insomnia.  9.  Enteric-coated aspirin 81 mg p.o. daily.  ____________________________ Adin Hector, MD bjk:aw D: 11/15/2012 08:07:37 ET T: 11/15/2012 08:30:23 ET JOB#: [id number]  cc: Tama High III, MD, <Dictator> Ramonita Lab MD ELECTRONICALLY SIGNED 11/19/2012 13:04

## 2014-07-03 NOTE — Consult Note (Signed)
PATIENT NAME:  Breanna Zhang, Breanna Zhang MR#:  [id number] DATE OF BIRTH:  Nov 27, 1940  AGE:  74 years  SEX:  Female  RACE:  White  DATE OF DICTATION:  11/10/2012  PLACE OF DICTATION:  [place], room # 107, [place], [place]:  11/09/2012  DATE OF CONSULTATION:  11/10/2012  CONSULTING PHYSICIAN:  Jenell Dobransky K. Oliver Heitzenrater, MD  SUBJECTIVE:  The patient was seen in consultation in room #107. The patient is a 74 year old white female who is retired after working in a [employer]. The patient is married for the third time for 22 years, and lives with her husband. The patient has multiple physical problems, as stated above, and came for weakness and syncope, and evaluation of the same. The patient was asked to be seen for depression and anxiety. Past psychiatric history:  No previous history of inpatient psychiatry. No history of suicide attempts. Is on Cymbalta 60 mg a day for depression, which seems to be helping, according to the nursing staff.  OBJECTIVE:  The patient is seen lying comfortably in bed. Alert and oriented, competent and cooperative. Affect is appropriate with her mood, which is low and down and depressed and anxious because of stressors of life, which include her being physically sick and needs help, and her daughter is physically sick and needs help with surgery, and son-in-law has terminal illness and needs help. All the stressors have added up for the patient to be overwhelmed and feeling low and down, and medications are helping. No psychosis. Memory is intact. Cognition is intact. The patient does report appetite and sleep disturbance because of being stressed out. Denies suicidal or homicidal plans. Insight and judgment fair and adequate.   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  RECOMMEND: Continue Cymbalta 60 mg p.o. daily for her depression. Can consider increasing it to 90 mg p.o. daily slowly. Consider adding Ambien 5 mg p.o. at bedtime p.r.n.  for sleep and rest. Counseling on outpatient basis to get help with coping skills and dealing with stressors of life.   ____________________________ Wallace Cullens. Franchot Mimes, MD skc:mr D: 11/10/2012 18:50:00 ET T: 11/10/2012 21:45:32 ET JOB#: [id number]  cc: Arlyn Leak K. Franchot Mimes, MD, <Dictator> Dewain Penning MD ELECTRONICALLY SIGNED 11/12/2012 7:51

## 2014-07-20 DIAGNOSIS — H905 Unspecified sensorineural hearing loss: Secondary | ICD-10-CM | POA: Insufficient documentation

## 2014-09-07 ENCOUNTER — Other Ambulatory Visit: Payer: Self-pay | Admitting: Internal Medicine

## 2014-09-07 DIAGNOSIS — R739 Hyperglycemia, unspecified: Secondary | ICD-10-CM | POA: Insufficient documentation

## 2014-09-07 DIAGNOSIS — Z1231 Encounter for screening mammogram for malignant neoplasm of breast: Secondary | ICD-10-CM

## 2014-09-07 DIAGNOSIS — R7303 Prediabetes: Secondary | ICD-10-CM | POA: Insufficient documentation

## 2014-09-25 ENCOUNTER — Ambulatory Visit
Admission: RE | Admit: 2014-09-25 | Discharge: 2014-09-25 | Disposition: A | Discharge status: Home / Self Care | Payer: PPO | Source: Ambulatory Visit | Attending: Internal Medicine | Admitting: Internal Medicine

## 2014-09-25 DIAGNOSIS — Z1231 Encounter for screening mammogram for malignant neoplasm of breast: Secondary | ICD-10-CM | POA: Diagnosis present

## 2014-09-25 HISTORY — DX: Personal history of antineoplastic chemotherapy: Z92.21

## 2014-10-01 ENCOUNTER — Other Ambulatory Visit: Payer: Self-pay | Admitting: Physician Assistant

## 2014-10-01 ENCOUNTER — Ambulatory Visit
Admission: RE | Admit: 2014-10-01 | Discharge: 2014-10-01 | Disposition: A | Discharge status: Home / Self Care | Payer: PPO | Source: Ambulatory Visit | Attending: Physician Assistant | Admitting: Physician Assistant

## 2014-10-01 DIAGNOSIS — M7989 Other specified soft tissue disorders: Secondary | ICD-10-CM | POA: Diagnosis present

## 2014-10-01 DIAGNOSIS — M79605 Pain in left leg: Secondary | ICD-10-CM

## 2014-10-01 DIAGNOSIS — R600 Localized edema: Secondary | ICD-10-CM

## 2015-02-25 ENCOUNTER — Ambulatory Visit: Payer: PPO | Admitting: Psychiatry

## 2015-03-18 DIAGNOSIS — G4733 Obstructive sleep apnea (adult) (pediatric): Secondary | ICD-10-CM | POA: Diagnosis not present

## 2015-04-06 ENCOUNTER — Encounter: Payer: Self-pay | Admitting: Psychiatry

## 2015-04-06 ENCOUNTER — Ambulatory Visit (INDEPENDENT_AMBULATORY_CARE_PROVIDER_SITE_OTHER): Payer: PPO | Admitting: Psychiatry

## 2015-04-06 VITALS — BP 120/78 | HR 76 | Temp 97.9°F | Ht 65.5 in | Wt 160.2 lb

## 2015-04-06 DIAGNOSIS — F331 Major depressive disorder, recurrent, moderate: Secondary | ICD-10-CM | POA: Diagnosis not present

## 2015-04-06 DIAGNOSIS — E039 Hypothyroidism, unspecified: Secondary | ICD-10-CM | POA: Insufficient documentation

## 2015-04-06 MED ORDER — ALPRAZOLAM ER 0.5 MG PO TB24: 0.5000 mg | ORAL_TABLET | Freq: Two times a day (BID) | ORAL | Status: DC

## 2015-04-06 NOTE — Progress Notes (Signed)
Psychiatric Initial Adult Assessment   Patient Identification: Breanna Zhang MRN:  [id number] Date of Evaluation:  04/06/2015 Referral Source: Dr Bridgett Larsson  Chief Complaint:   Chief Complaint    Establish Care; Depression     Visit Diagnosis:    ICD-9-CM ICD-10-CM   1. MDD (major depressive disorder), recurrent episode, moderate (HCC) 296.32 F33.1    Diagnosis:   Patient Active Problem List   Diagnosis Date Noted  . Adult hypothyroidism [E03.9] 04/06/2015  . Blood glucose elevated [R73.9] 09/07/2014  . 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 Clinical depression [F32.9] 06/22/2014  . Acid reflux [K21.9] 06/22/2014  . BP (high blood pressure) [I10] 06/22/2014  . Malignant neoplasm of breast ([place]) [C50.919] 06/22/2014  . Frequent UTI [N39.0] 06/22/2014  . Apnea, sleep [G47.30] 06/22/2014  . Head revolving around [R42] 06/22/2014  . Dizziness [R42] 04/16/2014  . 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 Neuritis or radiculitis due to rupture of lumbar intervertebral disc [M51.16] 10/06/2013  . Arthritis of knee, degenerative [M17.9] 10/06/2013  . Cystocele, midline [N81.11] 11/16/2011  . Female genuine stress incontinence [N39.3] 11/16/2011  . Incomplete bladder emptying [R33.9] 11/16/2011  . 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 Excessive urination at night [R35.1] 11/16/2011  . Neuralgia neuritis, sciatic nerve [M54.30] 11/16/2011  . Urge incontinence of urine [N39.41] 11/16/2011   History of Present Illness:  Patient is a 75 year old married female who presented for initial assessment. She is a patient of Dr. Bridgett Larsson who has recently closed his practice after retiring. Patient reported that she has been following Dr. Bridgett Larsson for many years. She is hard of hearing and is pleasant and cooperative during the interview. She reported that she has long history of depression.  She stated that she keeps herself busy during the daytime as  she takes care of her 3 grandchildren ages 12 and 43 and 8 years old. She is concerned about her drinking habits of her husband as he drinks steadily on a daily basis. She reported that is the only stress in her life at this time. 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 Patient reported that she is currently prescribed  combination of Cymbalta and alprazolam. She reported that she is recovering from the effects of alprazolam this morning. She feels that her memory is impaired due to the effects of alprazolam. She feels that it takes her many hours to recover from the effects of alprazolam  She is interested in having her medications adjusted. She currently denied having any perceptual disturbances. She denied having any suicidal ideations or plans.   Elements:  Severity:  moderate. Associated Signs/Symptoms: Depression Symptoms:  depressed mood, psychomotor retardation, fatigue, difficulty concentrating, loss of energy/fatigue, (Hypo) Manic Symptoms:  none Anxiety Symptoms:  Excessive Worry, Psychotic Symptoms:  none PTSD Symptoms: Negative NA  Past Medical History:  Past Medical History  Diagnosis Date  . Cancer ([place]) right    1999  . Status post chemotherapy     2000 right breast cancer  . Thyroid disease   . GERD (gastroesophageal reflux disease)   . Atrial fibrillation ([place])   . Goiter   . Diverticulosis   . UTI (urinary tract infection)   . Osteoporosis   . OSA (obstructive sleep apnea)   . Vertigo     Past Surgical History  Procedure Laterality Date  . Breast biopsy Left     negative 2006  . Mastectomy Right  1999  . Thyroidectomy    . Cochlear implant Right   . Abdominal hysterectomy    . Foot surgery Bilateral   . Hand surgery Right   . Eye surgery    . Cholecystectomy    . Partial colectomy     Family History:  Family History  Problem Relation Age of Onset  . Dementia Mother   . Dementia Sister   . Diabetes  Sister   . Heart attack Brother   . COPD Brother   . Colon cancer Brother   . Liver cancer Brother   . COPD Brother    Social History:   Social History   Social History  . Marital Status: Married    Spouse Name: N/A  . Number of Children: N/A  . Years of Education: N/A   Social History Main Topics  . Smoking status: Never Smoker   . Smokeless tobacco: Never Used  . Alcohol Use: No  . Drug Use: No  . Sexual Activity: No   Other Topics Concern  . None   Social History Narrative   Additional Social History: Married x 4.  Husband is alcoholic. She is concerned about is behavoir. 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  Has 4 children.   Musculoskeletal: Strength & Muscle Tone: within normal limits Gait & Station: normal Patient leans: N/A  Psychiatric Specialty Exam: HPI  ROS  Blood pressure 120/78, pulse 76, temperature 97.9 F (36.6 C), temperature source Tympanic, height 5' 5.5" (1.664 m), weight 160 lb 3.2 oz (72.666 kg), SpO2 98 %.Body mass index is 26.24 kg/(m^2).  General Appearance: Casual  Eye Contact:  Fair  Speech:  Clear and Coherent  Volume:  Decreased  Mood:  Depressed  Affect:  Congruent  Thought Process:  Coherent and Goal Directed  Orientation:  Full (Time, Place, and Person)  Thought Content:  WDL  Suicidal Thoughts:  No  Homicidal Thoughts:  No  Memory:  Immediate;   Fair  Judgement:  Fair  Insight:  Fair  Psychomotor Activity:  Normal  Concentration:  Fair  Recall:  [employer] [place]  Language: Fair  Akathisia:  No  Handed:  Right  AIMS (if indicated):    Assets:  Communication Skills Desire for Improvement Physical Health Social Support Transportation  ADL's:  Intact  Cognition: WNL  Sleep:  7-8   Is the patient at risk to self?  No. Has the patient been a risk to self in the past 6 months?  No. Has the patient been a risk to self within the distant past?  No. Is the patient a risk to  others?  No. Has the patient been a risk to others in the past 6 months?  No. Has the patient been a risk to others within the distant past?  No.  Allergies:   Allergies  Allergen Reactions  . Imipramine Tinitus  . Latex Rash  . Penicillins Rash  . Tape Rash   Current Medications: Current Outpatient Prescriptions  Medication Sig Dispense Refill  . ALPRAZolam (ALPRAZOLAM XR) 1 MG 24 hr tablet Take 1 mg by mouth at bedtime.    . ALPRAZolam (XANAX XR) 0.5 MG 24 hr tablet Take 0.5 mg by mouth every morning.  2  . atorvastatin (LIPITOR) 40 MG tablet Take 40 mg by mouth at bedtime.    . cyanocobalamin (V-R VITAMIN B-12) 500 MCG tablet Take 500 mg by mouth daily.    . DULoxetine (CYMBALTA)  60 MG capsule Take 60 mg by mouth every morning.  2  . meclizine (ANTIVERT) 25 MG tablet Take 25 mg by mouth.    . pantoprazole (PROTONIX) 40 MG tablet Take 40 mg by mouth daily.  11  . SYNTHROID 88 MCG tablet Take 88 mcg by mouth daily.  11   No current facility-administered medications for this visit.    Previous Psychotropic Medications: as reported  Substance Abuse History in the last 12 months:  No.  Consequences of Substance Abuse: Negative NA  Medical Decision Making:  Review of Psycho-Social Stressors (1) and Review and summation of old records (2)  Treatment Plan Summary: Medication management   Discussed with patient about the medications treatment risk benefits and alternatives. Also reviewed her records from Dr. Lucious Groves office. I will start her on alprazolam XR 0.5 mg by mouth twice a day  She will continue on Cymbalta 60 mg in the morning Discussed with patient about the medications in detail and she was given written instructions about the medication and she demonstrated understanding Follow-up in 1 month   Rainey Pines, MD   1/24/20179:16 AM

## 2015-04-09 DIAGNOSIS — H903 Sensorineural hearing loss, bilateral: Secondary | ICD-10-CM | POA: Diagnosis not present

## 2015-04-19 DIAGNOSIS — H353222 Exudative age-related macular degeneration, left eye, with inactive choroidal neovascularization: Secondary | ICD-10-CM | POA: Diagnosis not present

## 2015-04-22 DIAGNOSIS — H905 Unspecified sensorineural hearing loss: Secondary | ICD-10-CM | POA: Diagnosis not present

## 2015-05-04 ENCOUNTER — Encounter: Payer: Self-pay | Admitting: Psychiatry

## 2015-05-04 ENCOUNTER — Ambulatory Visit (INDEPENDENT_AMBULATORY_CARE_PROVIDER_SITE_OTHER): Payer: PPO | Admitting: Psychiatry

## 2015-05-04 VITALS — BP 138/80 | HR 82 | Temp 97.7°F | Ht 65.5 in | Wt 158.6 lb

## 2015-05-04 DIAGNOSIS — F331 Major depressive disorder, recurrent, moderate: Secondary | ICD-10-CM

## 2015-05-04 MED ORDER — ALPRAZOLAM ER 0.5 MG PO TB24: 0.5000 mg | ORAL_TABLET | Freq: Two times a day (BID) | ORAL | Status: DC

## 2015-05-04 MED ORDER — DULOXETINE HCL 60 MG PO CPEP: 60.0000 mg | ORAL_CAPSULE | Freq: Every morning | ORAL | Status: DC
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
## 2015-06-08 DIAGNOSIS — M1712 Unilateral primary osteoarthritis, left knee: Secondary | ICD-10-CM | POA: Diagnosis not present

## 2015-06-08 DIAGNOSIS — M5136 Other intervertebral disc degeneration, lumbar region: Secondary | ICD-10-CM | POA: Diagnosis not present

## 2015-06-08 DIAGNOSIS — M5416 Radiculopathy, lumbar region: Secondary | ICD-10-CM | POA: Diagnosis not present

## 2015-06-29 ENCOUNTER — Ambulatory Visit (INDEPENDENT_AMBULATORY_CARE_PROVIDER_SITE_OTHER): Payer: PPO | Admitting: Psychiatry

## 2015-06-29 ENCOUNTER — Encounter: Payer: Self-pay | Admitting: Psychiatry

## 2015-06-29 VITALS — BP 138/76 | HR 81 | Temp 97.7°F | Ht 65.5 in | Wt 151.8 lb

## 2015-06-29 DIAGNOSIS — F331 Major depressive disorder, recurrent, moderate: Secondary | ICD-10-CM

## 2015-06-29 MED ORDER — DULOXETINE HCL 60 MG PO CPEP: 60.0000 mg | ORAL_CAPSULE | Freq: Every morning | ORAL | Status: DC

## 2015-06-29 MED ORDER — ALPRAZOLAM 0.25 MG PO TABS: 0.2500 mg | ORAL_TABLET | Freq: Two times a day (BID) | ORAL | Status: DC | PRN

## 2015-06-29 NOTE — Progress Notes (Signed)
Psychiatric Follow Up MD  Note  Patient Identification: Breanna Zhang MRN:  [id number] Date of Evaluation:  06/29/2015 Referral Source: Dr Bridgett Larsson  Chief Complaint:   Chief Complaint    Follow-up; Medication Refill     Visit Diagnosis:    ICD-9-CM ICD-10-CM   1. MDD (major depressive disorder), recurrent episode, moderate (HCC) 296.32 F33.1    Diagnosis:   Patient Active Problem List   Diagnosis Date Noted  . Adult hypothyroidism [E03.9] 04/06/2015  . Blood glucose elevated [R73.9] 09/07/2014  . 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 Clinical depression [F32.9] 06/22/2014  . Acid reflux [K21.9] 06/22/2014  . BP (high blood pressure) [I10] 06/22/2014  . Malignant neoplasm of breast ([place]) [C50.919] 06/22/2014  . Frequent UTI [N39.0] 06/22/2014  . Apnea, sleep [G47.30] 06/22/2014  . Head revolving around [R42] 06/22/2014  . Dizziness [R42] 04/16/2014  . 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 Neuritis or radiculitis due to rupture of lumbar intervertebral disc [M51.16] 10/06/2013  . Arthritis of knee, degenerative [M17.9] 10/06/2013  . Cystocele, midline [N81.11] 11/16/2011  . Female genuine stress incontinence [N39.3] 11/16/2011  . Incomplete bladder emptying [R33.9] 11/16/2011  . 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 Excessive urination at night [R35.1] 11/16/2011  . Neuralgia neuritis, sciatic nerve [M54.30] 11/16/2011  . Urge incontinence of urine [N39.41] 11/16/2011   History of Present Illness:  Patient is a 75 year old married female who presented for follow-up. She reported that she has started improving since the medications were adjusted at her last appointment. She reported That she has been experiencing back problems as well as the issues. She has recently received injection in her knee and is stabilizing. She reported that she went to her primary care physician who is planning to give her steroid injection in  her back as well. 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 She also cleans her house on a weekly basis. She does a lot of turning interesting. She was not aware that it is causing a toll on her back. Patient reported that she is feeling much better since she has decreased the dose of her alprazolam and is willing to stop it completely. She wants to decrease the dose especially at night. She reported that she is not taking the alprazolam in the morning and is trying to relax on her own. She is very excited that she is feeling much better and her memory is improving. She currently denied having any suicidal ideations or plans. Her mood is improved.  Patient appeared calm and alert during the interview.She reported that her husband is also very happy with the changes in her medications. She currently denied use of drugs or alcohol. She reported that she is eating and sleeping well. She denied having any suicidal homicidal ideations or plans. She denied having any perceptual disturbances at this time.  She is interested in having her medications adjusted. She currently denied having any perceptual disturbances. She denied having any suicidal ideations or plans.   Elements:  Severity:  moderate. Associated Signs/Symptoms: Depression Symptoms:  depressed mood, psychomotor retardation, fatigue, difficulty concentrating, loss of energy/fatigue, (Hypo) Manic Symptoms:  none Anxiety Symptoms:  Excessive Worry, Psychotic Symptoms:  none PTSD Symptoms: Negative NA  Past Medical History:  Past Medical History  Diagnosis Date  . Cancer ([place]) right    1999  . Status post chemotherapy     2000 right breast cancer  .  Thyroid disease   . GERD (gastroesophageal reflux disease)   . Atrial fibrillation ([place])   . Goiter   . Diverticulosis   . UTI (urinary tract infection)   . Osteoporosis   . OSA (obstructive sleep apnea)   . Vertigo      Past Surgical History  Procedure Laterality Date  . Breast biopsy Left     negative 2006  . Mastectomy Right     1999  . Thyroidectomy    . Cochlear implant Right   . Abdominal hysterectomy    . Foot surgery Bilateral   . Hand surgery Right   . Eye surgery    . Cholecystectomy    . Partial colectomy     Family History:  Family History  Problem Relation Age of Onset  . Dementia Mother   . Dementia Sister   . Diabetes Sister   . Heart attack Brother   . COPD Brother   . Colon cancer Brother   . Liver cancer Brother   . COPD Brother    Social History:   Social History   Social History  . Marital Status: Married    Spouse Name: N/A  . Number of Children: N/A  . Years of Education: N/A   Social History Main Topics  . Smoking status: Never Smoker   . Smokeless tobacco: Never Used  . Alcohol Use: No  . Drug Use: No  . Sexual Activity: No   Other Topics Concern  . None   Social History Narrative   Additional Social History: Married x 4.  Husband is alcoholic. She is concerned about is behavoir. 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  Has 4 children.   Musculoskeletal: Strength & Muscle Tone: within normal limits Gait & Station: normal Patient leans: N/A  Psychiatric Specialty Exam: HPI  ROS  Blood pressure 138/76, pulse 81, temperature 97.7 F (36.5 C), temperature source Tympanic, height 5' 5.5" (1.664 m), weight 151 lb 12.8 oz (68.856 kg), SpO2 99 %.Body mass index is 24.87 kg/(m^2).  General Appearance: Casual  Eye Contact:  Fair  Speech:  Clear and Coherent  Volume:  Normal  Mood:  Anxious  Affect:  Congruent  Thought Process:  Coherent and Goal Directed  Orientation:  Full (Time, Place, and Person)  Thought Content:  WDL  Suicidal Thoughts:  No  Homicidal Thoughts:  No  Memory:  Immediate;   Fair  Judgement:  Fair  Insight:  Fair  Psychomotor Activity:  Normal  Concentration:  Fair  Recall:  [employer]  [place]  Language: Fair  Akathisia:  No  Handed:  Right  AIMS (if indicated):    Assets:  Communication Skills Desire for Improvement Physical Health Social Support Transportation  ADL's:  Intact  Cognition: WNL  Sleep:  7-8   Is the patient at risk to self?  No. Has the patient been a risk to self in the past 6 months?  No. Has the patient been a risk to self within the distant past?  No. Is the patient a risk to others?  No. Has the patient been a risk to others in the past 6 months?  No. Has the patient been a risk to others within the distant past?  No.  Allergies:   Allergies  Allergen Reactions  . Imipramine Tinitus  . Latex Rash  . Penicillins Rash  . Tape Rash   Current Medications: Current Outpatient Prescriptions  Medication Sig Dispense Refill  .  ALPRAZolam (XANAX XR) 0.5 MG 24 hr tablet Take 1 tablet (0.5 mg total) by mouth 2 (two) times daily. 60 tablet 2  . atorvastatin (LIPITOR) 40 MG tablet Take 40 mg by mouth at bedtime.    . cyanocobalamin (V-R VITAMIN B-12) 500 MCG tablet Take 500 mg by mouth daily.    . DULoxetine (CYMBALTA) 60 MG capsule Take 1 capsule (60 mg total) by mouth every morning. 30 capsule 2  . HYDROcodone-acetaminophen (NORCO) 7.5-325 MG tablet Take by mouth.    . pantoprazole (PROTONIX) 40 MG tablet Take 40 mg by mouth daily.  11  . SYNTHROID 88 MCG tablet Take 88 mcg by mouth daily.  11   No current facility-administered medications for this visit.    Previous Psychotropic Medications: as reported  Substance Abuse History in the last 12 months:  No.  Consequences of Substance Abuse: Negative NA  Medical Decision Making:  Review of Psycho-Social Stressors (1) and Review and summation of old records (2)  Treatment Plan Summary: Medication management   Discussed with patient about the medications treatment risk benefits and alternatives. I will start her on alprazolam  0.25 mg by mouth twice a day when necessary for  anxiety. She will continue on Cymbalta 60 mg in the morning. Medication refill for the next 2  months Discussed with patient about the medications in detail and she was given written instructions about the medication and she demonstrated understanding Follow-up in 1 months   Rainey Pines, MD   4/18/201710:21 AM

## 2015-07-02 DIAGNOSIS — M5416 Radiculopathy, lumbar region: Secondary | ICD-10-CM | POA: Diagnosis not present

## 2015-07-02 DIAGNOSIS — M5136 Other intervertebral disc degeneration, lumbar region: Secondary | ICD-10-CM | POA: Diagnosis not present

## 2015-07-19 DIAGNOSIS — H35372 Puckering of macula, left eye: Secondary | ICD-10-CM | POA: Diagnosis not present

## 2015-07-19 DIAGNOSIS — H353221 Exudative age-related macular degeneration, left eye, with active choroidal neovascularization: Secondary | ICD-10-CM | POA: Diagnosis not present

## 2015-07-29 ENCOUNTER — Encounter: Payer: Self-pay | Admitting: Psychiatry

## 2015-07-29 ENCOUNTER — Ambulatory Visit (INDEPENDENT_AMBULATORY_CARE_PROVIDER_SITE_OTHER): Payer: PPO | Admitting: Psychiatry

## 2015-07-29 VITALS — BP 110/82 | HR 82 | Temp 97.0°F | Ht 65.5 in | Wt 150.8 lb

## 2015-07-29 DIAGNOSIS — F331 Major depressive disorder, recurrent, moderate: Secondary | ICD-10-CM

## 2015-07-29 MED ORDER — ALPRAZOLAM 0.25 MG PO TABS: 0.2500 mg | ORAL_TABLET | Freq: Every evening | ORAL | Status: DC | PRN

## 2015-07-29 MED ORDER — DULOXETINE HCL 60 MG PO CPEP: 60.0000 mg | ORAL_CAPSULE | Freq: Every morning | ORAL | Status: DC

## 2015-07-29 NOTE — Progress Notes (Signed)
Psychiatric Follow Up MD  Note  Patient Identification: Breanna Zhang MRN:  [id number] Date of Evaluation:  07/29/2015 Referral Source: Dr Bridgett Larsson  Chief Complaint:   Chief Complaint    Follow-up; Medication Refill     Visit Diagnosis:    ICD-9-CM ICD-10-CM   1. MDD (major depressive disorder), recurrent episode, moderate (HCC) 296.32 F33.1    Diagnosis:   Patient Active Problem List   Diagnosis Date Noted  . Adult hypothyroidism [E03.9] 04/06/2015  . Blood glucose elevated [R73.9] 09/07/2014  . 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 Clinical depression [F32.9] 06/22/2014  . Acid reflux [K21.9] 06/22/2014  . BP (high blood pressure) [I10] 06/22/2014  . Malignant neoplasm of breast ([place]) [C50.919] 06/22/2014  . Frequent UTI [N39.0] 06/22/2014  . Apnea, sleep [G47.30] 06/22/2014  . Head revolving around [R42] 06/22/2014  . Dizziness [R42] 04/16/2014  . 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 Neuritis or radiculitis due to rupture of lumbar intervertebral disc [M51.16] 10/06/2013  . Arthritis of knee, degenerative [M17.9] 10/06/2013  . Cystocele, midline [N81.11] 11/16/2011  . Female genuine stress incontinence [N39.3] 11/16/2011  . Incomplete bladder emptying [R33.9] 11/16/2011  . 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 Excessive urination at night [R35.1] 11/16/2011  . Neuralgia neuritis, sciatic nerve [M54.30] 11/16/2011  . Urge incontinence of urine [N39.41] 11/16/2011   History of Present Illness:  Patient is a 75 year old married female who presented for follow-up. She reported that she has started improving since the medications were adjusted at her last appointment. She reportedThat she is not having any withdrawal symptoms since she has started cutting down on her alprazolam. Patient reported that she is only taking 0.25 mg at bedtime. She feels well and her memory is improving. She is very excited as she wants to  stop the alprazolam completely. She reported that she was going to the hospital on a weekly basis when she was taking the higher dose of the medication. She is looking forward to stop the medication. Patient reported that she is more alert and her memory is coming back. She has good relationship with her husband and she is able to cook for her self and her family. He appeared calm and alert during the interview. She currently denied having any suicidal ideations or plans. 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 She denied having any perceptual disturbances she denied having any suicidal homicidal ideations or plans.  She is interested in having her medications adjusted. She currently denied having any perceptual disturbances.    Elements:  Severity:  moderate. Associated Signs/Symptoms: Depression Symptoms:  difficulty concentrating, loss of energy/fatigue, (Hypo) Manic Symptoms:  none Anxiety Symptoms:  Excessive Worry, Psychotic Symptoms:  none PTSD Symptoms: Negative NA  Past Medical History:  Past Medical History  Diagnosis Date  . Cancer ([place]) right    1999  . Status post chemotherapy     2000 right breast cancer  . Thyroid disease   . GERD (gastroesophageal reflux disease)   . Atrial fibrillation ([place])   . Goiter   . Diverticulosis   . UTI (urinary tract infection)   . Osteoporosis   . OSA (obstructive sleep apnea)   . Vertigo     Past Surgical History  Procedure Laterality Date  . Breast biopsy Left     negative 2006  . Mastectomy Right     1999  . Thyroidectomy    . Cochlear implant Right   .  Abdominal hysterectomy    . Foot surgery Bilateral   . Hand surgery Right   . Eye surgery    . Cholecystectomy    . Partial colectomy     Family History:  Family History  Problem Relation Age of Onset  . Dementia Mother   . Dementia Sister   . Diabetes Sister   . Heart attack Brother   . COPD Brother   . Colon cancer Brother   .  Liver cancer Brother   . COPD Brother    Social History:   Social History   Social History  . Marital Status: Married    Spouse Name: N/A  . Number of Children: N/A  . Years of Education: N/A   Social History Main Topics  . Smoking status: Never Smoker   . Smokeless tobacco: Never Used  . Alcohol Use: No  . Drug Use: No  . Sexual Activity: No   Other Topics Concern  . None   Social History Narrative   Additional Social History: Married x 4.  Husband is alcoholic. She is concerned about is behavoir. 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  Has 4 children.   Musculoskeletal: Strength & Muscle Tone: within normal limits Gait & Station: normal Patient leans: N/A  Psychiatric Specialty Exam: HPI  ROS  Blood pressure 110/82, pulse 82, temperature 97 F (36.1 C), temperature source Tympanic, height 5' 5.5" (1.664 m), weight 150 lb 12.8 oz (68.402 kg), SpO2 99 %.Body mass index is 24.7 kg/(m^2).  General Appearance: Casual  Eye Contact:  Fair  Speech:  Clear and Coherent  Volume:  Normal  Mood:  Anxious  Affect:  Congruent  Thought Process:  Coherent and Goal Directed  Orientation:  Full (Time, Place, and Person)  Thought Content:  WDL  Suicidal Thoughts:  No  Homicidal Thoughts:  No  Memory:  Immediate;   Fair  Judgement:  Fair  Insight:  Fair  Psychomotor Activity:  Normal  Concentration:  Fair  Recall:  [employer] [place]  Language: Fair  Akathisia:  No  Handed:  Right  AIMS (if indicated):    Assets:  Communication Skills Desire for Improvement Physical Health Social Support Transportation  ADL's:  Intact  Cognition: WNL  Sleep:  7-8   Is the patient at risk to self?  No. Has the patient been a risk to self in the past 6 months?  No. Has the patient been a risk to self within the distant past?  No. Is the patient a risk to others?  No. Has the patient been a risk to others in the past 6 months?  No. Has the  patient been a risk to others within the distant past?  No.  Allergies:   Allergies  Allergen Reactions  . Imipramine Tinitus  . Latex Rash  . Penicillins Rash  . Tape Rash   Current Medications: Current Outpatient Prescriptions  Medication Sig Dispense Refill  . ALPRAZolam (XANAX) 0.25 MG tablet Take 1 tablet (0.25 mg total) by mouth 2 (two) times daily as needed for anxiety. 60 tablet 0  . atorvastatin (LIPITOR) 40 MG tablet Take 40 mg by mouth at bedtime.    . cholecalciferol (VITAMIN D) 1000 units tablet Take 1,000 Units by mouth daily.    . ciprofloxacin (CIPRO) 500 MG tablet Take by mouth.    . cyanocobalamin (V-R VITAMIN B-12) 500 MCG tablet Take 500 mg by mouth daily.    Marland Kitchen  DULoxetine (CYMBALTA) 60 MG capsule Take 1 capsule (60 mg total) by mouth every morning. 30 capsule 1  . pantoprazole (PROTONIX) 40 MG tablet Take 40 mg by mouth daily.  11  . SYNTHROID 88 MCG tablet Take 88 mcg by mouth daily.  11   No current facility-administered medications for this visit.    Previous Psychotropic Medications: as reported  Substance Abuse History in the last 12 months:  No.  Consequences of Substance Abuse: Negative NA  Medical Decision Making:  Review of Psycho-Social Stressors (1) and Review and summation of old records (2)  Treatment Plan Summary: Medication management   Decrease alprazolam 0.25 mg half pill at bedtime and then stopped when she is done with her medication Continue  Cymbalta 60 mg daily She will follow-up in 2 months or earlier depending on her symptoms   More than 50% of the time spent in psychoeducation, counseling and coordination of care.    This note was generated in part or whole with voice recognition software. Voice regonition is usually quite accurate but there are transcription errors that can and very often do occur. I apologize for any typographical errors that were not detected and corrected.   Rainey Pines, MD   5/18/201710:45 AM

## 2015-08-02 DIAGNOSIS — M5416 Radiculopathy, lumbar region: Secondary | ICD-10-CM | POA: Diagnosis not present

## 2015-08-02 DIAGNOSIS — M5136 Other intervertebral disc degeneration, lumbar region: Secondary | ICD-10-CM | POA: Diagnosis not present

## 2015-08-12 DIAGNOSIS — H903 Sensorineural hearing loss, bilateral: Secondary | ICD-10-CM | POA: Diagnosis not present

## 2015-08-24 ENCOUNTER — Other Ambulatory Visit: Payer: Self-pay | Admitting: Internal Medicine

## 2015-08-24 DIAGNOSIS — Z1231 Encounter for screening mammogram for malignant neoplasm of breast: Secondary | ICD-10-CM

## 2015-08-25 DIAGNOSIS — H353221 Exudative age-related macular degeneration, left eye, with active choroidal neovascularization: Secondary | ICD-10-CM | POA: Diagnosis not present

## 2015-09-02 DIAGNOSIS — R739 Hyperglycemia, unspecified: Secondary | ICD-10-CM | POA: Diagnosis not present

## 2015-09-02 DIAGNOSIS — M5416 Radiculopathy, lumbar region: Secondary | ICD-10-CM | POA: Diagnosis not present

## 2015-09-02 DIAGNOSIS — I10 Essential (primary) hypertension: Secondary | ICD-10-CM | POA: Diagnosis not present

## 2015-09-02 DIAGNOSIS — G4733 Obstructive sleep apnea (adult) (pediatric): Secondary | ICD-10-CM | POA: Diagnosis not present

## 2015-09-07 DIAGNOSIS — H903 Sensorineural hearing loss, bilateral: Secondary | ICD-10-CM | POA: Diagnosis not present

## 2015-09-08 DIAGNOSIS — R42 Dizziness and giddiness: Secondary | ICD-10-CM | POA: Diagnosis not present

## 2015-09-08 DIAGNOSIS — N8111 Cystocele, midline: Secondary | ICD-10-CM | POA: Diagnosis not present

## 2015-09-08 DIAGNOSIS — M5136 Other intervertebral disc degeneration, lumbar region: Secondary | ICD-10-CM | POA: Diagnosis not present

## 2015-09-08 DIAGNOSIS — I10 Essential (primary) hypertension: Secondary | ICD-10-CM | POA: Diagnosis not present

## 2015-09-08 DIAGNOSIS — R739 Hyperglycemia, unspecified: Secondary | ICD-10-CM | POA: Diagnosis not present

## 2015-09-08 DIAGNOSIS — G4733 Obstructive sleep apnea (adult) (pediatric): Secondary | ICD-10-CM | POA: Diagnosis not present

## 2015-09-08 DIAGNOSIS — D72828 Other elevated white blood cell count: Secondary | ICD-10-CM | POA: Diagnosis not present

## 2015-09-08 DIAGNOSIS — K219 Gastro-esophageal reflux disease without esophagitis: Secondary | ICD-10-CM | POA: Diagnosis not present

## 2015-09-08 DIAGNOSIS — Z853 Personal history of malignant neoplasm of breast: Secondary | ICD-10-CM | POA: Diagnosis not present

## 2015-09-08 DIAGNOSIS — N390 Urinary tract infection, site not specified: Secondary | ICD-10-CM | POA: Diagnosis not present

## 2015-09-08 DIAGNOSIS — F3342 Major depressive disorder, recurrent, in full remission: Secondary | ICD-10-CM | POA: Diagnosis not present

## 2015-09-08 DIAGNOSIS — E034 Atrophy of thyroid (acquired): Secondary | ICD-10-CM | POA: Diagnosis not present

## 2015-09-27 ENCOUNTER — Ambulatory Visit
Admission: RE | Admit: 2015-09-27 | Discharge: 2015-09-27 | Disposition: A | Discharge status: Home / Self Care | Payer: PPO | Source: Ambulatory Visit | Attending: Internal Medicine | Admitting: Internal Medicine

## 2015-09-27 ENCOUNTER — Other Ambulatory Visit: Payer: Self-pay | Admitting: Internal Medicine

## 2015-09-27 DIAGNOSIS — H353221 Exudative age-related macular degeneration, left eye, with active choroidal neovascularization: Secondary | ICD-10-CM | POA: Diagnosis not present

## 2015-09-27 DIAGNOSIS — Z1231 Encounter for screening mammogram for malignant neoplasm of breast: Secondary | ICD-10-CM | POA: Diagnosis not present

## 2015-09-27 HISTORY — DX: Personal history of antineoplastic chemotherapy: Z92.21

## 2015-09-27 HISTORY — DX: Malignant neoplasm of unspecified site of unspecified female breast: C50.919

## 2015-09-28 ENCOUNTER — Ambulatory Visit (INDEPENDENT_AMBULATORY_CARE_PROVIDER_SITE_OTHER): Payer: PPO | Admitting: Psychiatry

## 2015-09-28 ENCOUNTER — Encounter: Payer: Self-pay | Admitting: Psychiatry

## 2015-09-28 VITALS — BP 128/72 | HR 84 | Temp 97.7°F | Ht 65.5 in | Wt 149.2 lb

## 2015-09-28 DIAGNOSIS — F331 Major depressive disorder, recurrent, moderate: Secondary | ICD-10-CM

## 2015-09-28 MED ORDER — DULOXETINE HCL 60 MG PO CPEP: 60.0000 mg | ORAL_CAPSULE | Freq: Every morning | ORAL | Status: DC

## 2015-09-28 NOTE — Progress Notes (Signed)
Psychiatric Follow Up MD  Note  Patient Identification: Breanna Zhang MRN:  [id number] Date of Evaluation:  09/28/2015 Referral Source: Dr Bridgett Larsson  Chief Complaint:   Chief Complaint    Follow-up; Medication Refill     Visit Diagnosis:    ICD-9-CM ICD-10-CM   1. MDD (major depressive disorder), recurrent episode, moderate (HCC) 296.32 F33.1    Diagnosis:   Patient Active Problem List   Diagnosis Date Noted  . Personal history of malignant neoplasm of breast [Z85.3] 09/08/2015  . Adult hypothyroidism [E03.9] 04/06/2015  . Blood glucose elevated [R73.9] 09/07/2014  . 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 Clinical depression [F32.9] 06/22/2014  . Acid reflux [K21.9] 06/22/2014  . BP (high blood pressure) [I10] 06/22/2014  . Malignant neoplasm of breast ([place]) [C50.919] 06/22/2014  . Frequent UTI [N39.0] 06/22/2014  . Apnea, sleep [G47.30] 06/22/2014  . Head revolving around [R42] 06/22/2014  . Dizziness [R42] 04/16/2014  . 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 Neuritis or radiculitis due to rupture of lumbar intervertebral disc [M51.16] 10/06/2013  . Arthritis of knee, degenerative [M17.9] 10/06/2013  . Cystocele, midline [N81.11] 11/16/2011  . Female genuine stress incontinence [N39.3] 11/16/2011  . Incomplete bladder emptying [R33.9] 11/16/2011  . 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 Excessive urination at night [R35.1] 11/16/2011  . Neuralgia neuritis, sciatic nerve [M54.30] 11/16/2011  . Urge incontinence of urine [N39.41] 11/16/2011   History of Present Illness:  Patient is a 75 year old married female who presented for follow-up. She reported that she has Successfully tapered herself off of the alprazolam. She reported that she has doing well and does not have any withdrawal symptoms. She reported that she met with her primary care physician and discuss with him about the Cymbalta. She wants to taper herself off  of the Cymbalta but he also advised her that she should continue taking the medication. She is currently stressed out about her daughter who has been diagnosed with cirrhosis and liver cancer. We discussed about her medications as well. She is agreeable to take the Cymbalta at this time due to the recent stressors in her life. She stated that she controls her mood symptoms and reading Bible and goes to the church on a regular basis. She currently denied having any mood symptoms anxiety or paranoia. She denied having any suicidal ideations or plans. She appeared calm and alert during the interview.      Elements:  Severity:  mild. Associated Signs/Symptoms: Depression Symptoms:  difficulty concentrating, loss of energy/fatigue, (Hypo) Manic Symptoms:  none Anxiety Symptoms:  Excessive Worry, Psychotic Symptoms:  none PTSD Symptoms: Negative NA  Past Medical History:  Past Medical History  Diagnosis Date  . Status post chemotherapy     2000 right breast cancer  . Thyroid disease   . GERD (gastroesophageal reflux disease)   . Atrial fibrillation ([place])   . Goiter   . Diverticulosis   . UTI (urinary tract infection)   . Osteoporosis   . OSA (obstructive sleep apnea)   . Vertigo   . Cancer ([place]) right    1999  . Breast cancer ([place]) 1999    RT MASTECTOMY  . History of chemotherapy 2000    BREAST CA    Past Surgical History  Procedure Laterality Date  . Thyroidectomy    . Cochlear implant Right   . Abdominal hysterectomy    . Foot surgery Bilateral   . Hand surgery Right   . Eye surgery    .  Cholecystectomy    . Partial colectomy    . Breast biopsy Left 2006    negative  . Mastectomy Right 1999    BREAST CA   Family History:  Family History  Problem Relation Age of Onset  . Dementia Mother   . Dementia Sister   . Diabetes Sister   . Heart attack Brother   . COPD Brother   . Colon cancer Brother   . Liver cancer Brother   . COPD Brother   . Breast cancer Neg Hx     Social History:   Social History   Social History  . Marital Status: Married    Spouse Name: N/A  . Number of Children: N/A  . Years of Education: N/A   Social History Main Topics  . Smoking status: Never Smoker   . Smokeless tobacco: Never Used  . Alcohol Use: No  . Drug Use: No  . Sexual Activity: No   Other Topics Concern  . None   Social History Narrative   Additional Social History: Married x 4.  Husband is alcoholic. She is concerned about is behavoir. 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  Has 4 children.   Musculoskeletal: Strength & Muscle Tone: within normal limits Gait & Station: normal Patient leans: N/A  Psychiatric Specialty Exam: HPI  ROS  Blood pressure 128/72, pulse 84, temperature 97.7 F (36.5 C), temperature source Tympanic, height 5' 5.5" (1.664 m), weight 149 lb 3.2 oz (67.677 kg), SpO2 99 %.Body mass index is 24.44 kg/(m^2).  General Appearance: Casual  Eye Contact:  Fair  Speech:  Clear and Coherent  Volume:  Normal  Mood:  Anxious  Affect:  Congruent  Thought Process:  Coherent and Goal Directed  Orientation:  Full (Time, Place, and Person)  Thought Content:  WDL  Suicidal Thoughts:  No  Homicidal Thoughts:  No  Memory:  Immediate;   Fair  Judgement:  Fair  Insight:  Fair  Psychomotor Activity:  Normal  Concentration:  Fair  Recall:  [employer] [place]  Language: Fair  Akathisia:  No  Handed:  Right  AIMS (if indicated):    Assets:  Communication Skills Desire for Improvement Physical Health Social Support Transportation  ADL's:  Intact  Cognition: WNL  Sleep:  7-8   Is the patient at risk to self?  No. Has the patient been a risk to self in the past 6 months?  No. Has the patient been a risk to self within the distant past?  No. Is the patient a risk to others?  No. Has the patient been a risk to others in the past 6 months?  No. Has the patient been a risk to others within the  distant past?  No.  Allergies:   Allergies  Allergen Reactions  . Imipramine Tinitus  . Latex Rash  . Penicillins Rash  . Tape Rash   Current Medications: Current Outpatient Prescriptions  Medication Sig Dispense Refill  . ALPRAZolam (XANAX) 0.25 MG tablet Take 1 tablet (0.25 mg total) by mouth at bedtime as needed for anxiety. 1/2 pill at bed and then stop- pt has supply 15 tablet 0  . atorvastatin (LIPITOR) 40 MG tablet Take 40 mg by mouth at bedtime.    Marland Kitchen azelastine (ASTELIN) 0.1 % nasal spray PLACE 1 SPRAY INTO BOTH NOSTRILS 2 (TWO) TIMES DAILY FOR 7 DAYS. THEN TWICE A DAY AS NEEDED  5  . cholecalciferol (VITAMIN D) 1000 units  tablet Take 1,000 Units by mouth daily.    . cyanocobalamin (V-R VITAMIN B-12) 500 MCG tablet Take 500 mg by mouth daily.    . DULoxetine (CYMBALTA) 60 MG capsule Take 1 capsule (60 mg total) by mouth every morning. 30 capsule 1  . HYDROcodone-acetaminophen (NORCO) 7.5-325 MG tablet Take 1 tablet by mouth 2 (two) times daily as needed.  0  . pantoprazole (PROTONIX) 40 MG tablet Take 40 mg by mouth daily.  11  . SYNTHROID 88 MCG tablet Take 88 mcg by mouth daily.  11   No current facility-administered medications for this visit.    Previous Psychotropic Medications: as reported  Substance Abuse History in the last 12 months:  No.  Consequences of Substance Abuse: Negative NA  Medical Decision Making:  Review of Psycho-Social Stressors (1) and Review and summation of old records (2)  Treatment Plan Summary: Medication management   D/c  Xanax  Continue  Cymbalta 60 mg daily She will follow-up in 2 months or earlier depending on her symptoms   More than 50% of the time spent in psychoeducation, counseling and coordination of care.    This note was generated in part or whole with voice recognition software. Voice regonition is usually quite accurate but there are transcription errors that can and very often do occur. I apologize for any  typographical errors that were not detected and corrected.   Rainey Pines, MD   7/18/201710:54 AM

## 2015-11-01 DIAGNOSIS — H903 Sensorineural hearing loss, bilateral: Secondary | ICD-10-CM | POA: Diagnosis not present

## 2015-11-08 DIAGNOSIS — H353221 Exudative age-related macular degeneration, left eye, with active choroidal neovascularization: Secondary | ICD-10-CM | POA: Diagnosis not present

## 2015-11-12 DIAGNOSIS — H353211 Exudative age-related macular degeneration, right eye, with active choroidal neovascularization: Secondary | ICD-10-CM | POA: Diagnosis not present

## 2015-11-24 ENCOUNTER — Ambulatory Visit: Payer: Self-pay | Admitting: Psychiatry

## 2015-11-24 ENCOUNTER — Other Ambulatory Visit: Payer: Self-pay

## 2015-11-24 MED ORDER — DULOXETINE HCL 60 MG PO CPEP: 60.0000 mg | ORAL_CAPSULE | Freq: Every morning | ORAL | 0 refills | Status: DC

## 2015-11-24 NOTE — Telephone Encounter (Signed)
received a fax requesting a 90 day supply of duloxetine hcl dr. '60mg'$  cap take 1 capsule by mouth every morning.  pt last seen on  09-28-15 next appt  12-08-15 pt will not have enough medication to last until appt.

## 2015-12-08 ENCOUNTER — Ambulatory Visit (INDEPENDENT_AMBULATORY_CARE_PROVIDER_SITE_OTHER): Payer: PPO | Admitting: Psychiatry

## 2015-12-08 ENCOUNTER — Encounter: Payer: Self-pay | Admitting: Psychiatry

## 2015-12-08 VITALS — BP 135/72 | HR 91 | Temp 98.9°F | Ht 65.5 in | Wt 146.4 lb

## 2015-12-08 DIAGNOSIS — F330 Major depressive disorder, recurrent, mild: Secondary | ICD-10-CM | POA: Diagnosis not present

## 2015-12-08 MED ORDER — DULOXETINE HCL 60 MG PO CPEP: 60.0000 mg | ORAL_CAPSULE | Freq: Every morning | ORAL | 2 refills | Status: DC

## 2015-12-08 NOTE — Progress Notes (Signed)
Psychiatric Follow Up MD  Note  Patient Identification: Breanna Zhang MRN:  [id number] Date of Evaluation:  12/08/2015 Referral Source: Dr Bridgett Larsson  Chief Complaint:   Chief Complaint    Follow-up; Medication Refill     Visit Diagnosis:    ICD-9-CM ICD-10-CM   1. MDD (major depressive disorder), recurrent episode, mild (HCC) 296.31 F33.0    Diagnosis:   Patient Active Problem List   Diagnosis Date Noted  . Personal history of malignant neoplasm of breast [Z85.3] 09/08/2015  . Adult hypothyroidism [E03.9] 04/06/2015  . Blood glucose elevated [R73.9] 09/07/2014  . 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 Clinical depression [F32.9] 06/22/2014  . Acid reflux [K21.9] 06/22/2014  . BP (high blood pressure) [I10] 06/22/2014  . Malignant neoplasm of breast ([place]) [C50.919] 06/22/2014  . Frequent UTI [N39.0] 06/22/2014  . Apnea, sleep [G47.30] 06/22/2014  . Head revolving around [R42] 06/22/2014  . Dizziness [R42] 04/16/2014  . 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 Neuritis or radiculitis due to rupture of lumbar intervertebral disc [M51.16] 10/06/2013  . Arthritis of knee, degenerative [M17.9] 10/06/2013  . Cystocele, midline [N81.11] 11/16/2011  . Female genuine stress incontinence [N39.3] 11/16/2011  . Incomplete bladder emptying [R33.9] 11/16/2011  . 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 Excessive urination at night [R35.1] 11/16/2011  . Neuralgia neuritis, sciatic nerve [M54.30] 11/16/2011  . Urge incontinence of urine [N39.41] 11/16/2011   History of Present Illness:  Patient is a 75 year old married female who presented for follow-up. She reported that she has Been doing very well and has been compliant with her medications. She reported that she keeps herself busy and happy. She is cleaning a  house every 3 weeks. She reported that she only takes Cymbalta at this time and has been helping with her symptoms. She does not  have any acute psychiatric symptoms. She takes care of herself and has been sleeping well at night. She reported that she has been following with her primary care physician on a regular basis and has already received a flu shot. She does not want to change  her medications at this time.  She appeared calm and alert during the interview. We discussed about the medications that patient reported that she does not want to have anything changed at this time. She denied having any suicidal homicidal ideations or plans. She denied having any perceptual disturbances. She appeared calm and alert during the interview.      Elements:  Severity:  mild. Associated Signs/Symptoms: Depression Symptoms:  loss of energy/fatigue, (Hypo) Manic Symptoms:  none Anxiety Symptoms:  Excessive Worry, Psychotic Symptoms:  none PTSD Symptoms: Negative NA  Past Medical History:  Past Medical History:  Diagnosis Date  . Atrial fibrillation ([place])   . Breast cancer ([place]) 1999   RT MASTECTOMY  . Cancer ([place]) right   1999  . Diverticulosis   . GERD (gastroesophageal reflux disease)   . Goiter   . History of chemotherapy 2000   BREAST CA  . OSA (obstructive sleep apnea)   . Osteoporosis   . Status post chemotherapy    2000 right breast cancer  . Thyroid disease   . UTI (urinary tract infection)   . Vertigo     Past Surgical History:  Procedure Laterality Date  . ABDOMINAL HYSTERECTOMY    . BREAST BIOPSY Left 2006   negative  . CHOLECYSTECTOMY    . COCHLEAR IMPLANT Right   . EYE SURGERY    . FOOT  SURGERY Bilateral   . HAND SURGERY Right   . MASTECTOMY Right 1999   BREAST CA  . PARTIAL COLECTOMY    . THYROIDECTOMY     Family History:  Family History  Problem Relation Age of Onset  . Dementia Mother   . Dementia Sister   . Diabetes Sister   . Heart attack Brother   . COPD Brother   . Colon cancer Brother   . Liver cancer Brother   . COPD Brother   . Breast cancer Neg Hx    Social History:    Social History   Social History  . Marital status: Married    Spouse name: N/A  . Number of children: N/A  . Years of education: N/A   Social History Main Topics  . Smoking status: Never Smoker  . Smokeless tobacco: Never Used  . Alcohol use No  . Drug use: No  . Sexual activity: No   Other Topics Concern  . None   Social History Narrative  . None   Additional Social History: Married x 4.  Husband is alcoholic. She is concerned about is behavoir. 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  Has 4 children.   Musculoskeletal: Strength & Muscle Tone: within normal limits Gait & Station: normal Patient leans: N/A  Psychiatric Specialty Exam: HPI  ROS  Blood pressure 135/72, pulse 91, temperature 98.9 F (37.2 C), temperature source Oral, height 5' 5.5" (1.664 m), weight 146 lb 6.4 oz (66.4 kg).Body mass index is 23.99 kg/m.  General Appearance: Casual  Eye Contact:  Fair  Speech:  Clear and Coherent  Volume:  Normal  Mood:  Anxious  Affect:  Congruent  Thought Process:  Coherent and Goal Directed  Orientation:  Full (Time, Place, and Person)  Thought Content:  WDL  Suicidal Thoughts:  No  Homicidal Thoughts:  No  Memory:  Immediate;   Fair  Judgement:  Fair  Insight:  Fair  Psychomotor Activity:  Normal  Concentration:  Fair  Recall:  [employer] [place]  Language: Fair  Akathisia:  No  Handed:  Right  AIMS (if indicated):    Assets:  Communication Skills Desire for Improvement Physical Health Social Support Transportation  ADL's:  Intact  Cognition: WNL  Sleep:  7-8   Is the patient at risk to self?  No. Has the patient been a risk to self in the past 6 months?  No. Has the patient been a risk to self within the distant past?  No. Is the patient a risk to others?  No. Has the patient been a risk to others in the past 6 months?  No. Has the patient been a risk to others within the distant past?  No.  Allergies:    Allergies  Allergen Reactions  . Imipramine Tinitus  . Latex Rash  . Penicillins Rash  . Tape Rash   Current Medications: Current Outpatient Prescriptions  Medication Sig Dispense Refill  . atorvastatin (LIPITOR) 40 MG tablet Take 40 mg by mouth at bedtime.    Marland Kitchen azelastine (ASTELIN) 0.1 % nasal spray PLACE 1 SPRAY INTO BOTH NOSTRILS 2 (TWO) TIMES DAILY FOR 7 DAYS. THEN TWICE A DAY AS NEEDED  5  . cholecalciferol (VITAMIN D) 1000 units tablet Take 1,000 Units by mouth daily.    . cyanocobalamin (V-R VITAMIN B-12) 500 MCG tablet Take 500 mg by mouth daily.    . DULoxetine (CYMBALTA) 60 MG capsule Take 1  capsule (60 mg total) by mouth every morning. 90 capsule 2  . HYDROcodone-acetaminophen (NORCO) 7.5-325 MG tablet Take 1 tablet by mouth 2 (two) times daily as needed.  0  . pantoprazole (PROTONIX) 40 MG tablet Take 40 mg by mouth daily.  11  . SYNTHROID 88 MCG tablet Take 88 mcg by mouth daily.  11   No current facility-administered medications for this visit.     Previous Psychotropic Medications: as reported  Substance Abuse History in the last 12 months:  No.  Consequences of Substance Abuse: Negative NA  Medical Decision Making:  Review of Psycho-Social Stressors (1) and Review and summation of old records (2)  Treatment Plan Summary: Medication management    Continue  Cymbalta 60 mg daily She will follow-up in 6 months or earlier depending on her symptoms   More than 50% of the time spent in psychoeducation, counseling and coordination of care.    This note was generated in part or whole with voice recognition software. Voice regonition is usually quite accurate but there are transcription errors that can and very often do occur. I apologize for any typographical errors that were not detected and corrected.   Rainey Pines, MD   9/27/20179:22 AM

## 2015-12-09 DIAGNOSIS — H353211 Exudative age-related macular degeneration, right eye, with active choroidal neovascularization: Secondary | ICD-10-CM | POA: Diagnosis not present

## 2015-12-20 DIAGNOSIS — H353221 Exudative age-related macular degeneration, left eye, with active choroidal neovascularization: Secondary | ICD-10-CM | POA: Diagnosis not present

## 2016-01-07 DIAGNOSIS — H353211 Exudative age-related macular degeneration, right eye, with active choroidal neovascularization: Secondary | ICD-10-CM | POA: Diagnosis not present

## 2016-01-24 DIAGNOSIS — H903 Sensorineural hearing loss, bilateral: Secondary | ICD-10-CM | POA: Diagnosis not present

## 2016-02-07 DIAGNOSIS — H353221 Exudative age-related macular degeneration, left eye, with active choroidal neovascularization: Secondary | ICD-10-CM | POA: Diagnosis not present

## 2016-02-07 DIAGNOSIS — H353211 Exudative age-related macular degeneration, right eye, with active choroidal neovascularization: Secondary | ICD-10-CM | POA: Diagnosis not present

## 2016-02-14 DIAGNOSIS — H353221 Exudative age-related macular degeneration, left eye, with active choroidal neovascularization: Secondary | ICD-10-CM | POA: Diagnosis not present

## 2016-02-23 DIAGNOSIS — D72828 Other elevated white blood cell count: Secondary | ICD-10-CM | POA: Diagnosis not present

## 2016-02-23 DIAGNOSIS — I10 Essential (primary) hypertension: Secondary | ICD-10-CM | POA: Diagnosis not present

## 2016-02-23 DIAGNOSIS — E034 Atrophy of thyroid (acquired): Secondary | ICD-10-CM | POA: Diagnosis not present

## 2016-02-24 DIAGNOSIS — H903 Sensorineural hearing loss, bilateral: Secondary | ICD-10-CM | POA: Diagnosis not present

## 2016-03-01 DIAGNOSIS — G4733 Obstructive sleep apnea (adult) (pediatric): Secondary | ICD-10-CM | POA: Diagnosis not present

## 2016-03-01 DIAGNOSIS — R040 Epistaxis: Secondary | ICD-10-CM | POA: Diagnosis not present

## 2016-03-01 DIAGNOSIS — N390 Urinary tract infection, site not specified: Secondary | ICD-10-CM | POA: Diagnosis not present

## 2016-03-01 DIAGNOSIS — E034 Atrophy of thyroid (acquired): Secondary | ICD-10-CM | POA: Diagnosis not present

## 2016-03-01 DIAGNOSIS — Z853 Personal history of malignant neoplasm of breast: Secondary | ICD-10-CM | POA: Diagnosis not present

## 2016-03-01 DIAGNOSIS — F3342 Major depressive disorder, recurrent, in full remission: Secondary | ICD-10-CM | POA: Diagnosis not present

## 2016-03-01 DIAGNOSIS — R739 Hyperglycemia, unspecified: Secondary | ICD-10-CM | POA: Diagnosis not present

## 2016-03-01 DIAGNOSIS — K219 Gastro-esophageal reflux disease without esophagitis: Secondary | ICD-10-CM | POA: Diagnosis not present

## 2016-03-01 DIAGNOSIS — M5136 Other intervertebral disc degeneration, lumbar region: Secondary | ICD-10-CM | POA: Diagnosis not present

## 2016-03-01 DIAGNOSIS — I10 Essential (primary) hypertension: Secondary | ICD-10-CM | POA: Diagnosis not present

## 2016-03-15 DIAGNOSIS — H353211 Exudative age-related macular degeneration, right eye, with active choroidal neovascularization: Secondary | ICD-10-CM | POA: Diagnosis not present

## 2016-03-23 DIAGNOSIS — R040 Epistaxis: Secondary | ICD-10-CM | POA: Diagnosis not present

## 2016-03-23 DIAGNOSIS — G4733 Obstructive sleep apnea (adult) (pediatric): Secondary | ICD-10-CM | POA: Diagnosis not present

## 2016-04-05 ENCOUNTER — Other Ambulatory Visit: Payer: Self-pay | Admitting: Student

## 2016-04-05 DIAGNOSIS — K219 Gastro-esophageal reflux disease without esophagitis: Secondary | ICD-10-CM | POA: Diagnosis not present

## 2016-04-05 DIAGNOSIS — Z800 Family history of malignant neoplasm of digestive organs: Secondary | ICD-10-CM | POA: Diagnosis not present

## 2016-04-05 DIAGNOSIS — Z86010 Personal history of colonic polyps: Secondary | ICD-10-CM | POA: Diagnosis not present

## 2016-04-05 DIAGNOSIS — R1310 Dysphagia, unspecified: Secondary | ICD-10-CM | POA: Diagnosis not present

## 2016-04-05 DIAGNOSIS — R143 Flatulence: Secondary | ICD-10-CM | POA: Diagnosis not present

## 2016-04-05 DIAGNOSIS — R109 Unspecified abdominal pain: Secondary | ICD-10-CM | POA: Diagnosis not present

## 2016-04-10 ENCOUNTER — Ambulatory Visit
Admission: RE | Admit: 2016-04-10 | Discharge: 2016-04-10 | Disposition: A | Discharge status: Home / Self Care | Payer: PPO | Source: Ambulatory Visit | Attending: Student | Admitting: Student

## 2016-04-10 DIAGNOSIS — K449 Diaphragmatic hernia without obstruction or gangrene: Secondary | ICD-10-CM | POA: Insufficient documentation

## 2016-04-10 DIAGNOSIS — R1310 Dysphagia, unspecified: Secondary | ICD-10-CM | POA: Diagnosis not present

## 2016-04-10 DIAGNOSIS — K228 Other specified diseases of esophagus: Secondary | ICD-10-CM | POA: Diagnosis not present

## 2016-04-11 ENCOUNTER — Encounter: Payer: Self-pay | Admitting: *Deleted

## 2016-04-12 ENCOUNTER — Ambulatory Visit: Payer: PPO | Admitting: Anesthesiology

## 2016-04-12 ENCOUNTER — Encounter
Admission: RE | Disposition: A | Discharge status: Home / Self Care | Payer: Self-pay | Source: Ambulatory Visit | Attending: Unknown Physician Specialty

## 2016-04-12 ENCOUNTER — Ambulatory Visit
Admission: RE | Admit: 2016-04-12 | Discharge: 2016-04-12 | Disposition: A | Discharge status: Home / Self Care | Payer: PPO | Source: Ambulatory Visit | Attending: Unknown Physician Specialty | Admitting: Unknown Physician Specialty

## 2016-04-12 DIAGNOSIS — R1310 Dysphagia, unspecified: Secondary | ICD-10-CM | POA: Insufficient documentation

## 2016-04-12 DIAGNOSIS — F419 Anxiety disorder, unspecified: Secondary | ICD-10-CM | POA: Diagnosis not present

## 2016-04-12 DIAGNOSIS — K5730 Diverticulosis of large intestine without perforation or abscess without bleeding: Secondary | ICD-10-CM | POA: Diagnosis not present

## 2016-04-12 DIAGNOSIS — E079 Disorder of thyroid, unspecified: Secondary | ICD-10-CM | POA: Insufficient documentation

## 2016-04-12 DIAGNOSIS — F329 Major depressive disorder, single episode, unspecified: Secondary | ICD-10-CM | POA: Insufficient documentation

## 2016-04-12 DIAGNOSIS — K222 Esophageal obstruction: Secondary | ICD-10-CM | POA: Diagnosis not present

## 2016-04-12 DIAGNOSIS — Z79899 Other long term (current) drug therapy: Secondary | ICD-10-CM | POA: Insufficient documentation

## 2016-04-12 DIAGNOSIS — Z86718 Personal history of other venous thrombosis and embolism: Secondary | ICD-10-CM | POA: Insufficient documentation

## 2016-04-12 DIAGNOSIS — I4891 Unspecified atrial fibrillation: Secondary | ICD-10-CM | POA: Insufficient documentation

## 2016-04-12 DIAGNOSIS — K219 Gastro-esophageal reflux disease without esophagitis: Secondary | ICD-10-CM | POA: Diagnosis not present

## 2016-04-12 DIAGNOSIS — G4733 Obstructive sleep apnea (adult) (pediatric): Secondary | ICD-10-CM | POA: Diagnosis not present

## 2016-04-12 DIAGNOSIS — M810 Age-related osteoporosis without current pathological fracture: Secondary | ICD-10-CM | POA: Insufficient documentation

## 2016-04-12 DIAGNOSIS — Z853 Personal history of malignant neoplasm of breast: Secondary | ICD-10-CM | POA: Insufficient documentation

## 2016-04-12 DIAGNOSIS — K449 Diaphragmatic hernia without obstruction or gangrene: Secondary | ICD-10-CM | POA: Insufficient documentation

## 2016-04-12 DIAGNOSIS — M5030 Other cervical disc degeneration, unspecified cervical region: Secondary | ICD-10-CM | POA: Diagnosis not present

## 2016-04-12 HISTORY — DX: Major depressive disorder, single episode, unspecified: F32.9

## 2016-04-12 HISTORY — DX: Anxiety disorder, unspecified: F41.9

## 2016-04-12 HISTORY — DX: Other cervical disc degeneration, unspecified cervical region: M50.30

## 2016-04-12 HISTORY — DX: Depression, unspecified: F32.A

## 2016-04-12 HISTORY — DX: Acute embolism and thrombosis of unspecified deep veins of unspecified lower extremity: I82.409

## 2016-04-12 HISTORY — PX: ESOPHAGOGASTRODUODENOSCOPY (EGD) WITH PROPOFOL: SHX5813

## 2016-04-12 SURGERY — ESOPHAGOGASTRODUODENOSCOPY (EGD) WITH PROPOFOL
Anesthesia: General

## 2016-04-12 MED ORDER — SODIUM CHLORIDE 0.9 % IV SOLN
INTRAVENOUS | Status: DC
  Administered 2016-04-12: 11:00:00 via INTRAVENOUS

## 2016-04-12 MED ORDER — PROPOFOL 10 MG/ML IV BOLUS
INTRAVENOUS | Status: DC | PRN
  Administered 2016-04-12: 20 mg via INTRAVENOUS
  Administered 2016-04-12: 30 mg via INTRAVENOUS

## 2016-04-12 MED ORDER — PROPOFOL 500 MG/50ML IV EMUL
INTRAVENOUS | Status: AC
Start: 2016-04-12 — End: 2016-04-12
  Filled 2016-04-12: qty 50

## 2016-04-12 MED ORDER — FENTANYL CITRATE (PF) 100 MCG/2ML IJ SOLN
INTRAMUSCULAR | Status: AC
  Filled 2016-04-12: qty 2

## 2016-04-12 MED ORDER — LIDOCAINE HCL (PF) 2 % IJ SOLN
INTRAMUSCULAR | Status: DC | PRN
Start: 2016-04-12 — End: 2016-04-12
  Administered 2016-04-12: 50 mg

## 2016-04-12 MED ORDER — FENTANYL CITRATE (PF) 100 MCG/2ML IJ SOLN
INTRAMUSCULAR | Status: DC | PRN
  Administered 2016-04-12: 50 ug via INTRAVENOUS

## 2016-04-12 MED ORDER — MIDAZOLAM HCL 2 MG/2ML IJ SOLN
INTRAMUSCULAR | Status: AC
  Filled 2016-04-12: qty 2

## 2016-04-12 MED ORDER — PROPOFOL 500 MG/50ML IV EMUL
INTRAVENOUS | Status: DC | PRN
  Administered 2016-04-12: 50 ug/kg/min via INTRAVENOUS

## 2016-04-12 MED ORDER — MIDAZOLAM HCL 5 MG/5ML IJ SOLN
INTRAMUSCULAR | Status: DC | PRN
  Administered 2016-04-12: 1 mg via INTRAVENOUS

## 2016-04-12 NOTE — Transfer of Care (Signed)
Immediate Anesthesia Transfer of Care Note  Patient: Breanna Zhang  Procedure(s) Performed: Procedure(s): ESOPHAGOGASTRODUODENOSCOPY (EGD) WITH PROPOFOL (N/A)  Patient Location: PACU  Anesthesia Type:General  Level of Consciousness: sedated  Airway & Oxygen Therapy: Patient Spontanous Breathing and Patient connected to nasal cannula oxygen  Post-op Assessment: Report given to RN and Post -op Vital signs reviewed and stable  Post vital signs: Reviewed and stable  Last Vitals:  Vitals:   04/12/16 1029  BP: 120/76  Pulse: 66  Resp: 17  Temp: (!) 35.7 C    Last Pain:  Vitals:   04/12/16 1029  TempSrc: Tympanic         Complications: No apparent anesthesia complications

## 2016-04-12 NOTE — H&P (Signed)
Primary Care Physician:  Adin Hector, MD Primary Gastroenterologist:  Dr. Vira Agar  Pre-Procedure History & Physical: HPI:  Breanna Zhang is a 76 y.o. female is here for an endoscopy.   Past Medical History:  Diagnosis Date  . Anxiety   . Atrial fibrillation ([place])   . Breast cancer ([place]) 1999   RT MASTECTOMY  . Cancer ([place]) right   1999  . DDD (degenerative disc disease), cervical   . Depression   . Diverticulosis   . Diverticulosis   . DVT of lower extremity (deep venous thrombosis) ([place])   . GERD (gastroesophageal reflux disease)   . Goiter   . History of chemotherapy 2000   BREAST CA  . OSA (obstructive sleep apnea)   . Osteoporosis   . Status post chemotherapy    2000 right breast cancer  . Thyroid disease   . UTI (urinary tract infection)   . Vertigo     Past Surgical History:  Procedure Laterality Date  . ABDOMINAL HYSTERECTOMY    . BREAST BIOPSY Left 2006   negative  . CHOLECYSTECTOMY    . COCHLEAR IMPLANT Right   . EYE SURGERY    . FOOT SURGERY Bilateral   . HAND SURGERY Right   . MASTECTOMY Right 1999   BREAST CA  . PARTIAL COLECTOMY    . THYROIDECTOMY      Prior to Admission medications   Medication Sig Start Date End Date Taking? Authorizing Provider  atorvastatin (LIPITOR) 40 MG tablet Take 40 mg by mouth at bedtime. 09/07/14   Historical Provider, MD  azelastine (ASTELIN) 0.1 % nasal spray PLACE 1 SPRAY INTO BOTH NOSTRILS 2 (TWO) TIMES DAILY FOR 7 DAYS. THEN TWICE A DAY AS NEEDED 09/01/15   Historical Provider, MD  cholecalciferol (VITAMIN D) 1000 units tablet Take 1,000 Units by mouth daily.    Historical Provider, MD  cyanocobalamin (V-R VITAMIN B-12) 500 MCG tablet Take 500 mg by mouth daily.    Historical Provider, MD  DULoxetine (CYMBALTA) 60 MG capsule Take 1 capsule (60 mg total) by mouth every morning. 12/08/15   Rainey Pines, MD  HYDROcodone-acetaminophen (NORCO) 7.5-325 MG tablet Take 1 tablet by mouth 2 (two) times daily as needed.  07/31/15   Historical Provider, MD  pantoprazole (PROTONIX) 40 MG tablet Take 40 mg by mouth daily. 03/04/15   Historical Provider, MD  SYNTHROID 88 MCG tablet Take 88 mcg by mouth daily. 03/04/15   Historical Provider, MD    Allergies as of 04/11/2016 - Review Complete 04/11/2016  Allergen Reaction Noted  . Imipramine Tinitus 04/06/2015  . Latex Rash 04/06/2015  . Penicillins Rash 04/06/2015  . Tape Rash 04/06/2015    Family History  Problem Relation Age of Onset  . Dementia Mother   . Dementia Sister   . Diabetes Sister   . Heart attack Brother   . COPD Brother   . Colon cancer Brother   . Liver cancer Brother   . COPD Brother   . Breast cancer Neg Hx     Social History   Social History  . Marital status: Married    Spouse name: N/A  . Number of children: N/A  . Years of education: N/A   Occupational History  . Not on file.   Social History Main Topics  . Smoking status: Never Smoker  . Smokeless tobacco: Never Used  . Alcohol use No  . Drug use: No  . Sexual activity: No   Other Topics Concern  .  Not on file   Social History Narrative  . No narrative on file    Review of Systems: See HPI, otherwise negative ROS  Physical Exam: BP 120/76   Pulse 66   Temp (!) 96.3 F (35.7 C) (Tympanic)   Resp 17   Ht '5\' 6"'$  (1.676 m)   Wt 64.4 kg (142 lb)   SpO2 100%   BMI 22.92 kg/m  General:   Alert,  pleasant and cooperative in NAD Head:  Normocephalic and atraumatic. Neck:  Supple; no masses or thyromegaly. Lungs:  Clear throughout to auscultation.    Heart:  Regular rate and rhythm. Abdomen:  Soft, nontender and nondistended. Normal bowel sounds, without guarding, and without rebound.   Neurologic:  Alert and  oriented x4;  grossly normal neurologically.  Impression/Plan: Breanna Zhang is here for an endoscopy to be performed for dysphagia and abnormal barium swallow  Risks, benefits, limitations, and alternatives regarding  endoscopy have been  reviewed with the patient.  Questions have been answered.  All parties agreeable.   Gaylyn Cheers, MD  04/12/2016, 11:19 AM

## 2016-04-12 NOTE — Op Note (Signed)
[facility] Gastroenterology Patient Name: Breanna Zhang Procedure Date: 04/12/2016 11:04 AM MRN: [id number] Account #: [id number] Date of Birth: 01-Aug-1940 Admit Type: Outpatient Age: 76 Room: [facility] ENDO ROOM 4 Gender: Female Note Status: Finalized Procedure:            Upper GI endoscopy Indications:          Dysphagia Providers:            Manya Silvas, MD Referring MD:         Ramonita Lab, MD (Referring MD) Medicines:            Propofol per Anesthesia Complications:        No immediate complications. Procedure:            Pre-Anesthesia Assessment:                       - After reviewing the risks and benefits, the patient                        was deemed in satisfactory condition to undergo the                        procedure.                       After obtaining informed consent, the endoscope was                        passed under direct vision. Throughout the procedure,                        the patient's blood pressure, pulse, and oxygen                        saturations were monitored continuously. The                        Colonoscope was introduced through the mouth, and                        advanced to the second part of duodenum. The upper GI                        endoscopy was accomplished without difficulty. The                        patient tolerated the procedure well. Findings:      A mild Schatzki ring (acquired) was found at the gastroesophageal       junction. At the end of the procedure A guidewire was placed and the       scope was withdrawn. Dilation was performed with a Savary dilator with       mild resistance at 16 mm.      A small hiatal hernia was present.      The examined duodenum was normal. Impression:           - Mild Schatzki ring. Dilated.                       - Small hiatal hernia.                       -  Normal examined duodenum.                       - No specimens collected. Recommendation:       - Soft  diet for 3 days.                       - soft food for 3 days, eat slowly, chew well, take                        small bites Manya Silvas, MD 04/12/2016 11:35:47 AM This report has been signed electronically. Number of Addenda: 0 Note Initiated On: 04/12/2016 11:04 AM      [facility]

## 2016-04-12 NOTE — Anesthesia Post-op Follow-up Note (Cosign Needed)
Anesthesia QCDR form completed.        

## 2016-04-12 NOTE — Anesthesia Postprocedure Evaluation (Signed)
Anesthesia Post Note  Patient: Breanna Zhang  Procedure(s) Performed: Procedure(s) (LRB): ESOPHAGOGASTRODUODENOSCOPY (EGD) WITH PROPOFOL (N/A)  Patient location during evaluation: Endoscopy Anesthesia Type: General Level of consciousness: awake and alert and oriented Pain management: pain level controlled Vital Signs Assessment: post-procedure vital signs reviewed and stable Respiratory status: spontaneous breathing, nonlabored ventilation and respiratory function stable Cardiovascular status: blood pressure returned to baseline and stable Postop Assessment: no signs of nausea or vomiting Anesthetic complications: no     Last Vitals:  Vitals:   04/12/16 1130 04/12/16 1150  BP: (!) 162/67 (!) 175/70  Pulse: 75 70  Resp: 19 (!) 21  Temp: 36.1 C     Last Pain:  Vitals:   04/12/16 1130  TempSrc: Tympanic                 Alejandra Hunt

## 2016-04-12 NOTE — Anesthesia Preprocedure Evaluation (Signed)
Anesthesia Evaluation  Patient identified by MRN, date of birth, ID band Patient awake    Reviewed: Allergy & Precautions, NPO status , Patient's Chart, lab work & pertinent test results  History of Anesthesia Complications Negative for: history of anesthetic complications  Airway Mallampati: II  TM Distance: >3 FB Neck ROM: Full    Dental  (+) Upper Dentures, Lower Dentures   Pulmonary sleep apnea , neg COPD,    breath sounds clear to auscultation- rhonchi (-) wheezing      Cardiovascular Exercise Tolerance: Good hypertension, Pt. on medications (-) CAD and (-) Past MI  Rhythm:Regular Rate:Normal - Systolic murmurs and - Diastolic murmurs    Neuro/Psych Anxiety Depression negative neurological ROS     GI/Hepatic Neg liver ROS, GERD  ,  Endo/Other  neg diabetesHypothyroidism   Renal/GU negative Renal ROS     Musculoskeletal  (+) Arthritis ,   Abdominal (+) - obese,   Peds  Hematology negative hematology ROS (+)   Anesthesia Other Findings Past Medical History: No date: Anxiety No date: Atrial fibrillation ([place]) 1999: Breast cancer ([place])     Comment: RT MASTECTOMY right: Cancer ([place])     Comment: 1999 No date: DDD (degenerative disc disease), cervical No date: Depression No date: Diverticulosis No date: Diverticulosis No date: DVT of lower extremity (deep venous thrombosis* No date: GERD (gastroesophageal reflux disease) No date: Goiter 2000: History of chemotherapy     Comment: BREAST CA No date: OSA (obstructive sleep apnea) No date: Osteoporosis No date: Status post chemotherapy     Comment: 2000 right breast cancer No date: Thyroid disease No date: UTI (urinary tract infection) No date: Vertigo   Reproductive/Obstetrics                             Anesthesia Physical Anesthesia Plan  ASA: III  Anesthesia Plan: General   Post-op Pain Management:    Induction:  Intravenous  Airway Management Planned: Natural Airway  Additional Equipment:   Intra-op Plan:   Post-operative Plan:   Informed Consent: I have reviewed the patients History and Physical, chart, labs and discussed the procedure including the risks, benefits and alternatives for the proposed anesthesia with the patient or authorized representative who has indicated his/her understanding and acceptance.   Dental advisory given  Plan Discussed with: CRNA and Anesthesiologist  Anesthesia Plan Comments:         Anesthesia Quick Evaluation

## 2016-04-13 ENCOUNTER — Encounter: Payer: Self-pay | Admitting: Unknown Physician Specialty

## 2016-04-19 DIAGNOSIS — H353211 Exudative age-related macular degeneration, right eye, with active choroidal neovascularization: Secondary | ICD-10-CM | POA: Diagnosis not present

## 2016-04-24 DIAGNOSIS — H353221 Exudative age-related macular degeneration, left eye, with active choroidal neovascularization: Secondary | ICD-10-CM | POA: Diagnosis not present

## 2016-05-23 ENCOUNTER — Emergency Department: Payer: PPO

## 2016-05-23 ENCOUNTER — Encounter: Payer: Self-pay | Admitting: Intensive Care

## 2016-05-23 ENCOUNTER — Emergency Department
Admission: EM | Admit: 2016-05-23 | Discharge: 2016-05-23 | Disposition: A | Discharge status: Home / Self Care | Payer: PPO | Attending: Emergency Medicine | Admitting: Emergency Medicine

## 2016-05-23 DIAGNOSIS — M25531 Pain in right wrist: Secondary | ICD-10-CM | POA: Diagnosis not present

## 2016-05-23 DIAGNOSIS — E039 Hypothyroidism, unspecified: Secondary | ICD-10-CM | POA: Diagnosis not present

## 2016-05-23 DIAGNOSIS — S51851A Open bite of right forearm, initial encounter: Secondary | ICD-10-CM | POA: Insufficient documentation

## 2016-05-23 DIAGNOSIS — Y999 Unspecified external cause status: Secondary | ICD-10-CM | POA: Diagnosis not present

## 2016-05-23 DIAGNOSIS — S61531A Puncture wound without foreign body of right wrist, initial encounter: Secondary | ICD-10-CM | POA: Insufficient documentation

## 2016-05-23 DIAGNOSIS — M79601 Pain in right arm: Secondary | ICD-10-CM | POA: Diagnosis not present

## 2016-05-23 DIAGNOSIS — Y92009 Unspecified place in unspecified non-institutional (private) residence as the place of occurrence of the external cause: Secondary | ICD-10-CM | POA: Diagnosis not present

## 2016-05-23 DIAGNOSIS — Z23 Encounter for immunization: Secondary | ICD-10-CM | POA: Diagnosis not present

## 2016-05-23 DIAGNOSIS — M79641 Pain in right hand: Secondary | ICD-10-CM | POA: Diagnosis not present

## 2016-05-23 DIAGNOSIS — Y939 Activity, unspecified: Secondary | ICD-10-CM | POA: Diagnosis not present

## 2016-05-23 DIAGNOSIS — S51811A Laceration without foreign body of right forearm, initial encounter: Secondary | ICD-10-CM | POA: Diagnosis not present

## 2016-05-23 DIAGNOSIS — S59911A Unspecified injury of right forearm, initial encounter: Secondary | ICD-10-CM | POA: Diagnosis not present

## 2016-05-23 DIAGNOSIS — Z853 Personal history of malignant neoplasm of breast: Secondary | ICD-10-CM | POA: Insufficient documentation

## 2016-05-23 DIAGNOSIS — W540XXA Bitten by dog, initial encounter: Secondary | ICD-10-CM

## 2016-05-23 DIAGNOSIS — M79661 Pain in right lower leg: Secondary | ICD-10-CM | POA: Diagnosis not present

## 2016-05-23 LAB — CBC
HEMATOCRIT: 39 % (ref 35.0–47.0)
Hemoglobin: 13.1 g/dL (ref 12.0–16.0)
MCH: 29.2 pg (ref 26.0–34.0)
MCHC: 33.7 g/dL (ref 32.0–36.0)
MCV: 86.5 fL (ref 80.0–100.0)
PLATELETS: 262 10*3/uL (ref 150–440)
RBC: 4.51 MIL/uL (ref 3.80–5.20)
RDW: 12.8 % (ref 11.5–14.5)
WBC: 14.1 10*3/uL — AB (ref 3.6–11.0)

## 2016-05-23 LAB — BASIC METABOLIC PANEL
ANION GAP: 7 (ref 5–15)
BUN: 13 mg/dL (ref 6–20)
CALCIUM: 8.7 mg/dL — AB (ref 8.9–10.3)
CO2: 28 mmol/L (ref 22–32)
CREATININE: 0.82 mg/dL (ref 0.44–1.00)
Chloride: 104 mmol/L (ref 101–111)
Glucose, Bld: 170 mg/dL — ABNORMAL HIGH (ref 65–99)
Potassium: 3.6 mmol/L (ref 3.5–5.1)
SODIUM: 139 mmol/L (ref 135–145)

## 2016-05-23 LAB — CK: CK TOTAL: 107 U/L (ref 38–234)

## 2016-05-23 MED ORDER — OXYCODONE-ACETAMINOPHEN 5-325 MG PO TABS: 1.0000 | ORAL_TABLET | Freq: Four times a day (QID) | ORAL | 0 refills | Status: DC | PRN

## 2016-05-23 MED ORDER — MORPHINE SULFATE (PF) 4 MG/ML IV SOLN
4.0000 mg | Freq: Once | INTRAVENOUS | Status: AC
  Administered 2016-05-23: 4 mg via INTRAVENOUS
  Filled 2016-05-23: qty 1

## 2016-05-23 MED ORDER — DOXYCYCLINE HYCLATE 100 MG PO TABS
100.0000 mg | ORAL_TABLET | Freq: Once | ORAL | Status: AC
  Administered 2016-05-23: 100 mg via ORAL
  Filled 2016-05-23: qty 1

## 2016-05-23 MED ORDER — DOXYCYCLINE HYCLATE 100 MG PO CAPS: 100.0000 mg | ORAL_CAPSULE | Freq: Two times a day (BID) | ORAL | 0 refills | Status: DC

## 2016-05-23 MED ORDER — TETANUS-DIPHTH-ACELL PERTUSSIS 5-2.5-18.5 LF-MCG/0.5 IM SUSP
0.5000 mL | Freq: Once | INTRAMUSCULAR | Status: AC
  Administered 2016-05-23: 0.5 mL via INTRAMUSCULAR
  Filled 2016-05-23: qty 0.5

## 2016-05-23 MED ORDER — ONDANSETRON HCL 4 MG/2ML IJ SOLN
4.0000 mg | Freq: Once | INTRAMUSCULAR | Status: AC
  Administered 2016-05-23: 4 mg via INTRAVENOUS
  Filled 2016-05-23: qty 2

## 2016-05-23 NOTE — ED Notes (Signed)
ED Provider at bedside. 

## 2016-05-23 NOTE — ED Provider Notes (Signed)
[facility] Emergency Department Provider Note  ____________________________________________   First MD Initiated Contact with Patient 05/23/16 1626     (approximate)  I have reviewed the triage vital signs and the nursing notes.   HISTORY  Chief Complaint Animal Bite   HPI Breanna Zhang is a 76 y.o. female with a history of anxiety and atrial fibrillation was present in the emergency department after dog bite. Per EMS, the dog was not familiar with the patient but is the patient's daughter's dog. Report is that the dog is fully immunized. The patient does not know the date of her last tetanus shot. The dog attacked the patient biting her right forearm and wrist as well as the right lower extremity. The patient rates her pain as a 10 of 10 at this time. Says she is able to move her right wrist and fingers but with pain.   Past Medical History:  Diagnosis Date  . Anxiety   . Atrial fibrillation ([place])   . Breast cancer ([place]) 1999   RT MASTECTOMY  . Cancer ([place]) right   1999  . DDD (degenerative disc disease), cervical   . Depression   . Diverticulosis   . Diverticulosis   . DVT of lower extremity (deep venous thrombosis) ([place])   . GERD (gastroesophageal reflux disease)   . Goiter   . History of chemotherapy 2000   BREAST CA  . OSA (obstructive sleep apnea)   . Osteoporosis   . Status post chemotherapy    2000 right breast cancer  . Thyroid disease   . UTI (urinary tract infection)   . Vertigo     Patient Active Problem List   Diagnosis Date Noted  . Personal history of malignant neoplasm of breast 09/08/2015  . Adult hypothyroidism 04/06/2015  . Blood glucose elevated 09/07/2014  . Deafness, sensorineural 07/20/2014  . Clinical depression 06/22/2014  . Acid reflux 06/22/2014  . BP (high blood pressure) 06/22/2014  . Malignant neoplasm of breast ([place]) 06/22/2014  . Frequent UTI 06/22/2014  . Apnea, sleep 06/22/2014  . Head revolving  around 06/22/2014  . Dizziness 04/16/2014  . Degeneration of intervertebral disc of lumbar region 10/06/2013  . Neuritis or radiculitis due to rupture of lumbar intervertebral disc 10/06/2013  . Arthritis of knee, degenerative 10/06/2013  . Cystocele, midline 11/16/2011  . Female genuine stress incontinence 11/16/2011  . Incomplete bladder emptying 11/16/2011  . 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 Excessive urination at night 11/16/2011  . Neuralgia neuritis, sciatic nerve 11/16/2011  . Urge incontinence of urine 11/16/2011    Past Surgical History:  Procedure Laterality Date  . ABDOMINAL HYSTERECTOMY    . BREAST BIOPSY Left 2006   negative  . CHOLECYSTECTOMY    . COCHLEAR IMPLANT Right   . ESOPHAGOGASTRODUODENOSCOPY (EGD) WITH PROPOFOL N/A 04/12/2016   Procedure: ESOPHAGOGASTRODUODENOSCOPY (EGD) WITH PROPOFOL;  Surgeon: Manya Silvas, MD;  Location: [facility] ENDOSCOPY;  Service: Endoscopy;  Laterality: N/A;  . EYE SURGERY    . FOOT SURGERY Bilateral   . HAND SURGERY Right   . MASTECTOMY Right 1999   BREAST CA  . PARTIAL COLECTOMY    . THYROIDECTOMY      Prior to Admission medications   Medication Sig Start Date End Date Taking? Authorizing Provider  atorvastatin (LIPITOR) 40 MG tablet Take 40 mg by mouth at bedtime. 09/07/14   Historical Provider, MD  azelastine (ASTELIN) 0.1 % nasal spray PLACE 1 SPRAY INTO BOTH NOSTRILS 2 (TWO) TIMES DAILY  FOR 7 DAYS. THEN TWICE A DAY AS NEEDED 09/01/15   Historical Provider, MD  cholecalciferol (VITAMIN D) 1000 units tablet Take 1,000 Units by mouth daily.    Historical Provider, MD  cyanocobalamin (V-R VITAMIN B-12) 500 MCG tablet Take 500 mg by mouth daily.    Historical Provider, MD  DULoxetine (CYMBALTA) 60 MG capsule Take 1 capsule (60 mg total) by mouth every morning. 12/08/15   Rainey Pines, MD  HYDROcodone-acetaminophen (NORCO) 7.5-325 MG tablet Take 1 tablet by mouth 2 (two) times daily as needed. 07/31/15   Historical Provider,  MD  pantoprazole (PROTONIX) 40 MG tablet Take 40 mg by mouth daily. 03/04/15   Historical Provider, MD  SYNTHROID 88 MCG tablet Take 88 mcg by mouth daily. 03/04/15   Historical Provider, MD    Allergies Imipramine; Latex; Penicillins; and Tape  Family History  Problem Relation Age of Onset  . Dementia Mother   . Dementia Sister   . Diabetes Sister   . Heart attack Brother   . COPD Brother   . Colon cancer Brother   . Liver cancer Brother   . COPD Brother   . Breast cancer Neg Hx     Social History Social History  Substance Use Topics  . Smoking status: Never Smoker  . Smokeless tobacco: Never Used  . Alcohol use No    Review of Systems Constitutional: No fever/chills Eyes: No visual changes. ENT: No sore throat. Cardiovascular: Denies chest pain. Respiratory: Denies shortness of breath. Gastrointestinal: No abdominal pain.  No nausea, no vomiting.  No diarrhea.  No constipation. Genitourinary: Negative for dysuria. Musculoskeletal: Negative for back pain. Skin: Negative for rash. Neurological: Negative for headaches, focal weakness or numbness.  10-point ROS otherwise negative.  ____________________________________________   PHYSICAL EXAM:  VITAL SIGNS: ED Triage Vitals  Enc Vitals Group     BP --      Pulse Rate 05/23/16 1620 85     Resp 05/23/16 1620 (!) 22     Temp 05/23/16 1620 97.8 F (36.6 C)     Temp Source 05/23/16 1620 Oral     SpO2 05/23/16 1620 100 %     Weight 05/23/16 1623 142 lb (64.4 kg)     Height 05/23/16 1623 '5\' 7"'$  (1.702 m)     Head Circumference --      Peak Flow --      Pain Score 05/23/16 1623 10     Pain Loc --      Pain Edu? --      Excl. in [place]? --     Constitutional: Alert and oriented. Patient in mild to moderate distress. Eyes: Conjunctivae are normal. PERRL. EOMI. Head: Atraumatic. Nose: No congestion/rhinnorhea. Mouth/Throat: Mucous membranes are moist.   Neck: No stridor.   Cardiovascular: Normal rate, regular  rhythm. Grossly normal heart sounds.  Good peripheral circulation with equal and intact radial pulses. Brisk capillary refill to the right nailbeds. Respiratory: Normal respiratory effort.  No retractions. Lungs CTAB. Gastrointestinal: Soft and nontender. No distention.  Musculoskeletal: Mild swelling to the dorsum of the right forearm at the midpoint of the right forearm. There are several puncture wounds without active bleeding at this time. On the volar surface there is a 3 cm skin tear/laceration without any active bleeding at this time. Also a small puncture wound at the right medial wrist. Patient able to range the wrist as well as the fingers and hand but is unable to do this with pain. Right ankle Neurologic: ,  proximally there is another puncture wound that is to the lateral aspect of the right ankle. No deformity. Patient is neurovascularly intact distal to the site of this puncture wound. Normal speech and language. No gross focal neurologic deficits are appreciated. No gait instability. Skin:  Skin is warm, dry and intact. No rash noted. Psychiatric: Speech and behavior are normal.  ____________________________________________   LABS (all labs ordered are listed, but only abnormal results are displayed)  Labs Reviewed  CBC - Abnormal; Notable for the following:       Result Value   WBC 14.1 (*)    All other components within normal limits  BASIC METABOLIC PANEL - Abnormal; Notable for the following:    Glucose, Bld 170 (*)    Calcium 8.7 (*)    All other components within normal limits  CK   ____________________________________________  EKG   ____________________________________________  RADIOLOGY      DG Wrist Complete Right (Final result)  Result time 05/23/16 17:10:02  Final result by Inez Catalina, MD (05/23/16 17:10:02)           Narrative:   CLINICAL DATA: Recent dog attack with wrist pain, initial encounter  EXAM: RIGHT WRIST - COMPLETE 3+  VIEW  COMPARISON: None.  FINDINGS: No acute fracture or dislocation is noted. Significant degenerative changes of the first [facility] joint are noted with remodeling of the trapezium. No acute fracture or dislocation is noted. No soft tissue changes are seen.  IMPRESSION: Chronic changes at the first [facility] joint. No acute abnormality noted.   Electronically Signed By: Inez Catalina M.D. On: 05/23/2016 17:10            DG Hand Complete Right (Final result)  Result time 05/23/16 17:10:36  Final result by Inez Catalina, MD (05/23/16 17:10:36)           Narrative:   CLINICAL DATA: Recent dog attack with right hand pain, initial encounter  EXAM: RIGHT HAND - COMPLETE 3+ VIEW  COMPARISON: None.  FINDINGS: Significant degenerative changes are again noted at the first [facility] joint with remodeling of the trapezium. No acute fracture or dislocation is noted. No gross soft tissue abnormality is seen.  IMPRESSION: No acute abnormality noted.   Electronically Signed By: Inez Catalina M.D. On: 05/23/2016 17:10            DG Tibia/Fibula Right (Final result)  Result time 05/23/16 17:11:08  Final result by Inez Catalina, MD (05/23/16 17:11:08)           Narrative:   CLINICAL DATA: Recent dog attack with right leg pain, initial encounter  EXAM: RIGHT TIBIA AND FIBULA - 2 VIEW  COMPARISON: None.  FINDINGS: There is no evidence of fracture or other focal bone lesions. Soft tissues are unremarkable.  IMPRESSION: No acute abnormality noted.   Electronically Signed By: Inez Catalina M.D. On: 05/23/2016 17:11          ____________________________________________   PROCEDURES  Procedure(s) performed:   Procedures  Critical Care performed:   ____________________________________________   INITIAL IMPRESSION / ASSESSMENT AND PLAN / ED COURSE  Pertinent labs & imaging results that were available during my care of the patient were reviewed  by me and considered in my medical decision making (see chart for details).  ----------------------------------------- 6:06 PM on 05/23/2016 -----------------------------------------  Patient without acute fracture. Wounds rinsed thoroughly and dressed. Will be discharged with doxycycline. Also with several Steri-Strips placed over the volar laceration of the right forearm. The patient as well as her family  the reason for not suturing this as there is a high risk of infection because of the patient having sustained the dog bite. The patient is understanding the plan and willing to comply. Will follow up with her primary care doctor. Continues to be neurovascularly intact with the bleeding controlled.      ____________________________________________   FINAL CLINICAL IMPRESSION(S) / ED DIAGNOSES  Animal bite. Laceration.    NEW MEDICATIONS STARTED DURING THIS VISIT:  New Prescriptions   No medications on file     Note:  This document was prepared using Dragon voice recognition software and may include unintentional dictation errors.    Orbie Pyo, MD 05/23/16 [id number]

## 2016-05-23 NOTE — ED Notes (Signed)
XR at bedside

## 2016-05-23 NOTE — ED Triage Notes (Addendum)
Patient arrived by EMS from her daughters home. PAtient went to her daughters house to meet the children getting off the bus and while she was folding clothes their sheppard dog came in through the doggy door and attacked the patient. A&O x4. Patient reports the dog did not knock her to the ground. Patient has multiple wounds/dog bites on R leg and multiple bites noted with swelling to R arm. Laceration noted to R arm also. HX R sided masectomy

## 2016-05-24 DIAGNOSIS — M25542 Pain in joints of left hand: Secondary | ICD-10-CM

## 2016-05-24 DIAGNOSIS — M25541 Pain in joints of right hand: Secondary | ICD-10-CM | POA: Insufficient documentation

## 2016-05-25 DIAGNOSIS — M25541 Pain in joints of right hand: Secondary | ICD-10-CM | POA: Diagnosis not present

## 2016-05-25 DIAGNOSIS — W540XXA Bitten by dog, initial encounter: Secondary | ICD-10-CM | POA: Diagnosis not present

## 2016-05-25 DIAGNOSIS — M25542 Pain in joints of left hand: Secondary | ICD-10-CM | POA: Diagnosis not present

## 2016-05-25 DIAGNOSIS — F3342 Major depressive disorder, recurrent, in full remission: Secondary | ICD-10-CM | POA: Diagnosis not present

## 2016-05-25 DIAGNOSIS — S41151A Open bite of right upper arm, initial encounter: Secondary | ICD-10-CM | POA: Diagnosis not present

## 2016-05-25 DIAGNOSIS — I10 Essential (primary) hypertension: Secondary | ICD-10-CM | POA: Diagnosis not present

## 2016-05-29 DIAGNOSIS — S71151A Open bite, right thigh, initial encounter: Secondary | ICD-10-CM | POA: Diagnosis not present

## 2016-05-29 DIAGNOSIS — S51859A Open bite of unspecified forearm, initial encounter: Secondary | ICD-10-CM | POA: Diagnosis not present

## 2016-05-29 DIAGNOSIS — W540XXA Bitten by dog, initial encounter: Secondary | ICD-10-CM | POA: Diagnosis not present

## 2016-06-02 DIAGNOSIS — H353211 Exudative age-related macular degeneration, right eye, with active choroidal neovascularization: Secondary | ICD-10-CM | POA: Diagnosis not present

## 2016-06-07 ENCOUNTER — Ambulatory Visit: Payer: PPO | Admitting: Psychiatry

## 2016-06-08 ENCOUNTER — Ambulatory Visit: Payer: PPO | Admitting: Psychiatry

## 2016-06-08 DIAGNOSIS — W540XXD Bitten by dog, subsequent encounter: Secondary | ICD-10-CM | POA: Diagnosis not present

## 2016-06-08 DIAGNOSIS — H903 Sensorineural hearing loss, bilateral: Secondary | ICD-10-CM | POA: Diagnosis not present

## 2016-06-08 DIAGNOSIS — S51851D Open bite of right forearm, subsequent encounter: Secondary | ICD-10-CM | POA: Diagnosis not present

## 2016-06-08 DIAGNOSIS — S71151D Open bite, right thigh, subsequent encounter: Secondary | ICD-10-CM | POA: Diagnosis not present

## 2016-06-14 ENCOUNTER — Ambulatory Visit: Admission: RE | Admit: 2016-06-14 | Payer: PPO | Source: Ambulatory Visit | Admitting: Unknown Physician Specialty

## 2016-06-14 ENCOUNTER — Encounter: Admission: RE | Payer: Self-pay | Source: Ambulatory Visit

## 2016-06-14 SURGERY — COLONOSCOPY WITH PROPOFOL
Anesthesia: General

## 2016-07-10 ENCOUNTER — Ambulatory Visit (INDEPENDENT_AMBULATORY_CARE_PROVIDER_SITE_OTHER): Payer: PPO | Admitting: Psychiatry

## 2016-07-10 ENCOUNTER — Encounter: Payer: Self-pay | Admitting: Psychiatry

## 2016-07-10 VITALS — BP 134/74 | HR 88 | Temp 98.5°F | Wt 148.2 lb

## 2016-07-10 DIAGNOSIS — F330 Major depressive disorder, recurrent, mild: Secondary | ICD-10-CM

## 2016-07-10 DIAGNOSIS — F411 Generalized anxiety disorder: Secondary | ICD-10-CM | POA: Diagnosis not present

## 2016-07-10 MED ORDER — DULOXETINE HCL 60 MG PO CPEP: 60.0000 mg | ORAL_CAPSULE | Freq: Every morning | ORAL | 1 refills | Status: DC

## 2016-07-10 NOTE — Progress Notes (Signed)
Psychiatric Follow Up MD  Note  Patient Identification: Breanna Zhang MRN:  [id number] Date of Evaluation:  07/10/2016 Referral Source: Dr Bridgett Larsson  Chief Complaint:   Chief Complaint    Follow-up; Medication Refill     Visit Diagnosis:    ICD-9-CM ICD-10-CM   1. MDD (major depressive disorder), recurrent episode, mild (HCC) 296.31 F33.0   2. Generalized anxiety disorder 300.02 F41.1    Diagnosis:   Patient Active Problem List   Diagnosis Date Noted  . Personal history of malignant neoplasm of breast [Z85.3] 09/08/2015  . Adult hypothyroidism [E03.9] 04/06/2015  . Blood glucose elevated [R73.9] 09/07/2014  . 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 Clinical depression [F32.9] 06/22/2014  . Acid reflux [K21.9] 06/22/2014  . BP (high blood pressure) [I10] 06/22/2014  . Malignant neoplasm of breast ([place]) [C50.919] 06/22/2014  . Frequent UTI [N39.0] 06/22/2014  . Apnea, sleep [G47.30] 06/22/2014  . Head revolving around [R42] 06/22/2014  . Dizziness [R42] 04/16/2014  . 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 Neuritis or radiculitis due to rupture of lumbar intervertebral disc [M51.16] 10/06/2013  . 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 Cystocele, midline [N81.11] 11/16/2011  . Female genuine stress incontinence [N39.3] 11/16/2011  . Incomplete bladder emptying [R33.9] 11/16/2011  . 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 Excessive urination at night [R35.1] 11/16/2011  . Neuralgia neuritis, sciatic nerve [M54.30] 11/16/2011  . Urge incontinence of urine [N39.41] 11/16/2011   History of Present Illness:  Patient is a 76 year old married female who presented for follow-up. She Was last seen in September. Patient reported that she was bit by her daughter's dog in March. Patient reported that she usually sits her grandchildren after they come back from school. She was at her daughter's house when the bull dog   charged at her and she was unable to protect her. She was bit on her right leg and her arm. She was finally able to run away from the house. Patient reported that she became very apprehensive and anxious after that. Patient reported that she is lucky that he was not able to bite on her face. Patient reported that now she is doing well and she did not call for any benzodiazepine and she continues to take her medications and has been able to get support from her family. Patient reported that she is sleeping well at this time. She denied having any perceptual disturbances.   She appeared calm and alert during the interview. We discussed about the medications that patient reported that she does not want to have anything changed at this time. She denied having any suicidal homicidal ideations or plans. She denied having any perceptual disturbances.     Elements:  Severity:  mild. Associated Signs/Symptoms: Depression Symptoms:  loss of energy/fatigue, (Hypo) Manic Symptoms:  none Anxiety Symptoms:  Excessive Worry, Psychotic Symptoms:  none PTSD Symptoms: Negative NA  Past Medical History:  Past Medical History:  Diagnosis Date  . Anxiety   . Atrial fibrillation ([place])   . Breast cancer ([place]) 1999   RT MASTECTOMY  . Cancer ([place]) right   1999  . DDD (degenerative disc disease), cervical   . Depression   . Diverticulosis   . Diverticulosis   . DVT of lower extremity (deep venous thrombosis) ([place])   . GERD (gastroesophageal reflux disease)   . Goiter   . History of chemotherapy 2000   BREAST CA  . OSA (obstructive sleep apnea)   . Osteoporosis   .  Status post chemotherapy    2000 right breast cancer  . Thyroid disease   . UTI (urinary tract infection)   . Vertigo     Past Surgical History:  Procedure Laterality Date  . ABDOMINAL HYSTERECTOMY    . BREAST BIOPSY Left 2006   negative  . CHOLECYSTECTOMY    . COCHLEAR IMPLANT Right   . ESOPHAGOGASTRODUODENOSCOPY (EGD) WITH PROPOFOL  N/A 04/12/2016   Procedure: ESOPHAGOGASTRODUODENOSCOPY (EGD) WITH PROPOFOL;  Surgeon: Manya Silvas, MD;  Location[facility] ENDOSCOPY;  Service: Endoscopy;  Laterality: N/A;  . EYE SURGERY    . FOOT SURGERY Bilateral   . HAND SURGERY Right   . MASTECTOMY Right 1999   BREAST CA  . PARTIAL COLECTOMY    . THYROIDECTOMY     Family History:  Family History  Problem Relation Age of Onset  . Dementia Mother   . Dementia Sister   . Diabetes Sister   . Heart attack Brother   . COPD Brother   . Colon cancer Brother   . Liver cancer Brother   . COPD Brother   . Breast cancer Neg Hx    Social History:   Social History   Social History  . Marital status: Married    Spouse name: N/A  . Number of children: N/A  . Years of education: N/A   Social History Main Topics  . Smoking status: Never Smoker  . Smokeless tobacco: Never Used  . Alcohol use No  . Drug use: No  . Sexual activity: No   Other Topics Concern  . None   Social History Narrative  . None   Additional Social History: Married x 4.  Husband is alcoholic. She is concerned about is behavoir. 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  Has 4 children.   Musculoskeletal: Strength & Muscle Tone: within normal limits Gait & Station: normal Patient leans: N/A  Psychiatric Specialty Exam: Medication Refill     ROS  Blood pressure 134/74, pulse 88, temperature 98.5 F (36.9 C), temperature source Oral, weight 148 lb 3.2 oz (67.2 kg).Body mass index is 23.21 kg/m.  General Appearance: Casual  Eye Contact:  Fair  Speech:  Clear and Coherent  Volume:  Normal  Mood:  Anxious  Affect:  Congruent  Thought Process:  Coherent and Goal Directed  Orientation:  Full (Time, Place, and Person)  Thought Content:  WDL  Suicidal Thoughts:  No  Homicidal Thoughts:  No  Memory:  Immediate;   Fair  Judgement:  Fair  Insight:  Fair  Psychomotor Activity:  Normal  Concentration:  Fair  Recall:  [employer] [place]  Language: Fair  Akathisia:  No  Handed:  Right  AIMS (if indicated):    Assets:  Communication Skills Desire for Improvement Physical Health Social Support Transportation  ADL's:  Intact  Cognition: WNL  Sleep:  7-8   Is the patient at risk to self?  No. Has the patient been a risk to self in the past 6 months?  No. Has the patient been a risk to self within the distant past?  No. Is the patient a risk to others?  No. Has the patient been a risk to others in the past 6 months?  No. Has the patient been a risk to others within the distant past?  No.  Allergies:   Allergies  Allergen Reactions  . Imipramine Tinitus  . Latex Rash  . Penicillins Rash  .  Tape Rash   Current Medications: Current Outpatient Prescriptions  Medication Sig Dispense Refill  . atorvastatin (LIPITOR) 40 MG tablet Take 40 mg by mouth at bedtime.    Marland Kitchen azelastine (ASTELIN) 0.1 % nasal spray PLACE 1 SPRAY INTO BOTH NOSTRILS 2 (TWO) TIMES DAILY FOR 7 DAYS. THEN TWICE A DAY AS NEEDED  5  . cholecalciferol (VITAMIN D) 1000 units tablet Take 1,000 Units by mouth daily.    . cyanocobalamin (V-R VITAMIN B-12) 500 MCG tablet Take 500 mg by mouth daily.    Marland Kitchen doxycycline (VIBRAMYCIN) 100 MG capsule Take 1 capsule (100 mg total) by mouth 2 (two) times daily. 20 capsule 0  . DULoxetine (CYMBALTA) 60 MG capsule Take 1 capsule (60 mg total) by mouth every morning. 90 capsule 1  . HYDROcodone-acetaminophen (NORCO) 7.5-325 MG tablet Take 1 tablet by mouth 2 (two) times daily as needed.  0  . oxyCODONE-acetaminophen (ROXICET) 5-325 MG tablet Take 1-2 tablets by mouth every 6 (six) hours as needed. 12 tablet 0  . pantoprazole (PROTONIX) 40 MG tablet Take 40 mg by mouth daily.  11  . SYNTHROID 88 MCG tablet Take 88 mcg by mouth daily.  11   No current facility-administered medications for this visit.     Previous Psychotropic Medications: as reported  Substance Abuse History in the last 12  months:  No.  Consequences of Substance Abuse: Negative NA  Medical Decision Making:  Review of Psycho-Social Stressors (1) and Review and summation of old records (2)  Treatment Plan Summary: Medication management    Continue  Cymbalta 60 mg daily She will follow-up in 3 months or earlier depending on her symptoms   More than 50% of the time spent in psychoeducation, counseling and coordination of care.    This note was generated in part or whole with voice recognition software. Voice regonition is usually quite accurate but there are transcription errors that can and very often do occur. I apologize for any typographical errors that were not detected and corrected.   Rainey Pines, MD   4/30/20182:39 PM

## 2016-07-12 DIAGNOSIS — W540XXD Bitten by dog, subsequent encounter: Secondary | ICD-10-CM | POA: Diagnosis not present

## 2016-07-12 DIAGNOSIS — S51851D Open bite of right forearm, subsequent encounter: Secondary | ICD-10-CM | POA: Diagnosis not present

## 2016-07-12 DIAGNOSIS — S71151D Open bite, right thigh, subsequent encounter: Secondary | ICD-10-CM | POA: Diagnosis not present

## 2016-07-19 DIAGNOSIS — M65311 Trigger thumb, right thumb: Secondary | ICD-10-CM | POA: Diagnosis not present

## 2016-07-19 DIAGNOSIS — M65312 Trigger thumb, left thumb: Secondary | ICD-10-CM | POA: Diagnosis not present

## 2016-07-19 DIAGNOSIS — M79641 Pain in right hand: Secondary | ICD-10-CM | POA: Diagnosis not present

## 2016-07-19 DIAGNOSIS — M79642 Pain in left hand: Secondary | ICD-10-CM | POA: Diagnosis not present

## 2016-07-21 DIAGNOSIS — H353221 Exudative age-related macular degeneration, left eye, with active choroidal neovascularization: Secondary | ICD-10-CM | POA: Diagnosis not present

## 2016-07-24 ENCOUNTER — Encounter: Payer: Self-pay | Admitting: *Deleted

## 2016-07-24 ENCOUNTER — Ambulatory Visit
Admission: RE | Admit: 2016-07-24 | Discharge: 2016-07-24 | Disposition: A | Discharge status: Home / Self Care | Payer: PPO | Source: Ambulatory Visit | Attending: Unknown Physician Specialty | Admitting: Unknown Physician Specialty

## 2016-07-24 ENCOUNTER — Ambulatory Visit: Payer: PPO | Admitting: Anesthesiology

## 2016-07-24 ENCOUNTER — Encounter
Admission: RE | Disposition: A | Discharge status: Home / Self Care | Payer: Self-pay | Source: Ambulatory Visit | Attending: Unknown Physician Specialty

## 2016-07-24 DIAGNOSIS — D123 Benign neoplasm of transverse colon: Secondary | ICD-10-CM | POA: Insufficient documentation

## 2016-07-24 DIAGNOSIS — Z91040 Latex allergy status: Secondary | ICD-10-CM | POA: Diagnosis not present

## 2016-07-24 DIAGNOSIS — Z79899 Other long term (current) drug therapy: Secondary | ICD-10-CM | POA: Insufficient documentation

## 2016-07-24 DIAGNOSIS — Z9049 Acquired absence of other specified parts of digestive tract: Secondary | ICD-10-CM | POA: Diagnosis not present

## 2016-07-24 DIAGNOSIS — Z880 Allergy status to penicillin: Secondary | ICD-10-CM | POA: Insufficient documentation

## 2016-07-24 DIAGNOSIS — I4891 Unspecified atrial fibrillation: Secondary | ICD-10-CM | POA: Diagnosis not present

## 2016-07-24 DIAGNOSIS — I739 Peripheral vascular disease, unspecified: Secondary | ICD-10-CM | POA: Insufficient documentation

## 2016-07-24 DIAGNOSIS — K5730 Diverticulosis of large intestine without perforation or abscess without bleeding: Secondary | ICD-10-CM | POA: Diagnosis not present

## 2016-07-24 DIAGNOSIS — Z9221 Personal history of antineoplastic chemotherapy: Secondary | ICD-10-CM | POA: Insufficient documentation

## 2016-07-24 DIAGNOSIS — Z800 Family history of malignant neoplasm of digestive organs: Secondary | ICD-10-CM | POA: Insufficient documentation

## 2016-07-24 DIAGNOSIS — Z1211 Encounter for screening for malignant neoplasm of colon: Secondary | ICD-10-CM | POA: Insufficient documentation

## 2016-07-24 DIAGNOSIS — K621 Rectal polyp: Secondary | ICD-10-CM | POA: Insufficient documentation

## 2016-07-24 DIAGNOSIS — R42 Dizziness and giddiness: Secondary | ICD-10-CM | POA: Insufficient documentation

## 2016-07-24 DIAGNOSIS — Z86010 Personal history of colonic polyps: Secondary | ICD-10-CM | POA: Insufficient documentation

## 2016-07-24 DIAGNOSIS — Z888 Allergy status to other drugs, medicaments and biological substances status: Secondary | ICD-10-CM | POA: Insufficient documentation

## 2016-07-24 DIAGNOSIS — Z90710 Acquired absence of both cervix and uterus: Secondary | ICD-10-CM | POA: Diagnosis not present

## 2016-07-24 DIAGNOSIS — K648 Other hemorrhoids: Secondary | ICD-10-CM | POA: Diagnosis not present

## 2016-07-24 DIAGNOSIS — Z820 Family history of epilepsy and other diseases of the nervous system: Secondary | ICD-10-CM | POA: Insufficient documentation

## 2016-07-24 DIAGNOSIS — E039 Hypothyroidism, unspecified: Secondary | ICD-10-CM | POA: Insufficient documentation

## 2016-07-24 DIAGNOSIS — Z825 Family history of asthma and other chronic lower respiratory diseases: Secondary | ICD-10-CM | POA: Insufficient documentation

## 2016-07-24 DIAGNOSIS — G4733 Obstructive sleep apnea (adult) (pediatric): Secondary | ICD-10-CM | POA: Insufficient documentation

## 2016-07-24 DIAGNOSIS — G4730 Sleep apnea, unspecified: Secondary | ICD-10-CM | POA: Insufficient documentation

## 2016-07-24 DIAGNOSIS — Z853 Personal history of malignant neoplasm of breast: Secondary | ICD-10-CM | POA: Insufficient documentation

## 2016-07-24 DIAGNOSIS — M5030 Other cervical disc degeneration, unspecified cervical region: Secondary | ICD-10-CM | POA: Insufficient documentation

## 2016-07-24 DIAGNOSIS — Z8249 Family history of ischemic heart disease and other diseases of the circulatory system: Secondary | ICD-10-CM | POA: Insufficient documentation

## 2016-07-24 DIAGNOSIS — F329 Major depressive disorder, single episode, unspecified: Secondary | ICD-10-CM | POA: Insufficient documentation

## 2016-07-24 DIAGNOSIS — Z9621 Cochlear implant status: Secondary | ICD-10-CM | POA: Insufficient documentation

## 2016-07-24 DIAGNOSIS — Z9011 Acquired absence of right breast and nipple: Secondary | ICD-10-CM | POA: Diagnosis not present

## 2016-07-24 DIAGNOSIS — K640 First degree hemorrhoids: Secondary | ICD-10-CM | POA: Insufficient documentation

## 2016-07-24 DIAGNOSIS — Z86718 Personal history of other venous thrombosis and embolism: Secondary | ICD-10-CM | POA: Insufficient documentation

## 2016-07-24 DIAGNOSIS — K635 Polyp of colon: Secondary | ICD-10-CM | POA: Diagnosis not present

## 2016-07-24 DIAGNOSIS — K5790 Diverticulosis of intestine, part unspecified, without perforation or abscess without bleeding: Secondary | ICD-10-CM | POA: Diagnosis not present

## 2016-07-24 DIAGNOSIS — K219 Gastro-esophageal reflux disease without esophagitis: Secondary | ICD-10-CM | POA: Diagnosis not present

## 2016-07-24 DIAGNOSIS — Z980 Intestinal bypass and anastomosis status: Secondary | ICD-10-CM | POA: Diagnosis not present

## 2016-07-24 DIAGNOSIS — F419 Anxiety disorder, unspecified: Secondary | ICD-10-CM | POA: Insufficient documentation

## 2016-07-24 HISTORY — PX: COLONOSCOPY WITH PROPOFOL: SHX5780

## 2016-07-24 SURGERY — COLONOSCOPY WITH PROPOFOL
Anesthesia: General

## 2016-07-24 MED ORDER — PROPOFOL 500 MG/50ML IV EMUL
INTRAVENOUS | Status: AC
  Filled 2016-07-24: qty 50

## 2016-07-24 MED ORDER — PROPOFOL 10 MG/ML IV BOLUS
INTRAVENOUS | Status: DC | PRN
  Administered 2016-07-24: 10 mg via INTRAVENOUS
  Administered 2016-07-24: 20 mg via INTRAVENOUS

## 2016-07-24 MED ORDER — SODIUM CHLORIDE 0.9 % IV SOLN
INTRAVENOUS | Status: DC
  Administered 2016-07-24 (×2): via INTRAVENOUS

## 2016-07-24 MED ORDER — SODIUM CHLORIDE 0.9 % IV SOLN: INTRAVENOUS | Status: DC

## 2016-07-24 MED ORDER — PROPOFOL 500 MG/50ML IV EMUL
INTRAVENOUS | Status: DC | PRN
  Administered 2016-07-24: 75 ug/kg/min via INTRAVENOUS

## 2016-07-24 NOTE — H&P (Signed)
Primary Care Physician:  Adin Hector, MD Primary Gastroenterologist:  Dr. Vira Agar  Pre-Procedure History & Physical: HPI:  Breanna Zhang is a 76 y.o. female is here for an colonoscopy.   Past Medical History:  Diagnosis Date  . Anxiety   . Atrial fibrillation ([place])   . Breast cancer ([place]) 1999   RT MASTECTOMY  . Cancer ([place]) right   1999  . DDD (degenerative disc disease), cervical   . Depression   . Diverticulosis   . Diverticulosis   . DVT of lower extremity (deep venous thrombosis) ([place])   . GERD (gastroesophageal reflux disease)   . Goiter   . History of chemotherapy 2000   BREAST CA  . OSA (obstructive sleep apnea)   . Osteoporosis   . Status post chemotherapy    2000 right breast cancer  . Thyroid disease   . UTI (urinary tract infection)   . Vertigo     Past Surgical History:  Procedure Laterality Date  . ABDOMINAL HYSTERECTOMY    . BREAST BIOPSY Left 2006   negative  . CHOLECYSTECTOMY    . COCHLEAR IMPLANT Right   . ESOPHAGOGASTRODUODENOSCOPY (EGD) WITH PROPOFOL N/A 04/12/2016   Procedure: ESOPHAGOGASTRODUODENOSCOPY (EGD) WITH PROPOFOL;  Surgeon: Manya Silvas, MD;  Location: [facility] ENDOSCOPY;  Service: Endoscopy;  Laterality: N/A;  . EYE SURGERY    . FOOT SURGERY Bilateral   . HAND SURGERY Right   . MASTECTOMY Right 1999   BREAST CA  . PARTIAL COLECTOMY    . THYROIDECTOMY      Prior to Admission medications   Medication Sig Start Date End Date Taking? Authorizing Provider  atorvastatin (LIPITOR) 40 MG tablet Take 40 mg by mouth at bedtime. 09/07/14  Yes [provider]  cyanocobalamin (V-R VITAMIN B-12) 500 MCG tablet Take 500 mg by mouth daily.   Yes [provider]  DULoxetine (CYMBALTA) 60 MG capsule Take 1 capsule (60 mg total) by mouth every morning. 07/10/16  Yes Rainey Pines, MD  pantoprazole (PROTONIX) 40 MG tablet Take 40 mg by mouth daily. 03/04/15  Yes [provider]  SYNTHROID 88 MCG tablet Take 88 mcg  by mouth daily. 03/04/15  Yes [provider]  azelastine (ASTELIN) 0.1 % nasal spray PLACE 1 SPRAY INTO BOTH NOSTRILS 2 (TWO) TIMES DAILY FOR 7 DAYS. THEN TWICE A DAY AS NEEDED 09/01/15   [provider]  cholecalciferol (VITAMIN D) 1000 units tablet Take 1,000 Units by mouth daily.    [provider]  doxycycline (VIBRAMYCIN) 100 MG capsule Take 1 capsule (100 mg total) by mouth 2 (two) times daily. Patient not taking: Reported on 07/24/2016 05/23/16   Orbie Pyo, MD  HYDROcodone-acetaminophen (NORCO) 7.5-325 MG tablet Take 1 tablet by mouth 2 (two) times daily as needed. 07/31/15   [provider]  oxyCODONE-acetaminophen (ROXICET) 5-325 MG tablet Take 1-2 tablets by mouth every 6 (six) hours as needed. 05/23/16   Orbie Pyo, MD    Allergies as of 07/14/2016 - Review Complete 07/10/2016  Allergen Reaction Noted  . Imipramine Tinitus 04/06/2015  . Latex Rash 04/06/2015  . Penicillins Rash 04/06/2015  . Tape Rash 04/06/2015    Family History  Problem Relation Age of Onset  . Dementia Mother   . Dementia Sister   . Diabetes Sister   . Heart attack Brother   . COPD Brother   . Colon cancer Brother   . Liver cancer Brother   . COPD Brother   .  Breast cancer Neg Hx     Social History   Social History  . Marital status: Married    Spouse name: N/A  . Number of children: N/A  . Years of education: N/A   Occupational History  . Not on file.   Social History Main Topics  . Smoking status: Never Smoker  . Smokeless tobacco: Never Used  . Alcohol use No  . Drug use: No  . Sexual activity: No   Other Topics Concern  . Not on file   Social History Narrative  . No narrative on file    Review of Systems: See HPI, otherwise negative ROS  Physical Exam: BP 137/63   Pulse 76   Temp (!) 96.9 F (36.1 C) (Tympanic)   Resp 18   Ht '5\' 6"'$  (1.676 m)   Wt 64.9 kg (143 lb)   SpO2 100%   BMI 23.08 kg/m  General:    Alert,  pleasant and cooperative in NAD Head:  Normocephalic and atraumatic. Neck:  Supple; no masses or thyromegaly. Lungs:  Clear throughout to auscultation.    Heart:  Regular rate and rhythm. Abdomen:  Soft, nontender and nondistended. Normal bowel sounds, without guarding, and without rebound.   Neurologic:  Alert and  oriented x4;  grossly normal neurologically.  Impression/Plan: Breanna Zhang is here for an colonoscopy to be performed for [facility] colon polyps  Risks, benefits, limitations, and alternatives regarding  colonoscopy have been reviewed with the patient.  Questions have been answered.  All parties agreeable.   Gaylyn Cheers, MD  07/24/2016, 7:30 AM

## 2016-07-24 NOTE — Transfer of Care (Signed)
Immediate Anesthesia Transfer of Care Note  Patient: Breanna Zhang  Procedure(s) Performed: Procedure(s): COLONOSCOPY WITH PROPOFOL (N/A)  Patient Location: PACU and Endoscopy Unit  Anesthesia Type:General  Level of Consciousness: awake, alert  and oriented  Airway & Oxygen Therapy: Patient Spontanous Breathing  Post-op Assessment: Report given to RN and Post -op Vital signs reviewed and stable  Post vital signs: Reviewed and stable  Last Vitals:  Vitals:   07/24/16 0716 07/24/16 0810  BP: 137/63 (!) 141/59  Pulse: 76 73  Resp: 18 16  Temp: (!) 36.1 C (!) 35.6 C    Last Pain:  Vitals:   07/24/16 0810  TempSrc: Tympanic         Complications: No apparent anesthesia complications

## 2016-07-24 NOTE — Op Note (Signed)
[facility] Gastroenterology Patient Name: Breanna Zhang Procedure Date: 07/24/2016 7:17 AM MRN: [id number] Account #: [id number] Date of Birth: 31-Aug-1940 Admit Type: Outpatient Age: 76 Room: [facility] ENDO ROOM 3 Gender: Female Note Status: Finalized Procedure:            Colonoscopy Indications:          High risk colon cancer surveillance: Personal history                        of colonic polyps Providers:            Manya Silvas, MD Referring MD:         Ramonita Lab, MD (Referring MD) Medicines:            Propofol per Anesthesia Complications:        No immediate complications. Procedure:            Pre-Anesthesia Assessment:                       - After reviewing the risks and benefits, the patient                        was deemed in satisfactory condition to undergo the                        procedure.                       After obtaining informed consent, the colonoscope was                        passed under direct vision. Throughout the procedure,                        the patient's blood pressure, pulse, and oxygen                        saturations were monitored continuously. The                        Colonoscope was introduced through the anus and                        advanced to the the ileocolonic anastomosis. The                        colonoscopy was performed without difficulty. The                        patient tolerated the procedure well. The quality of                        the bowel preparation was excellent. Findings:      The colon was rather long, even with previous surgery.      A diminutive polyp was found in the proximal transverse colon. The polyp       was sessile. The polyp was removed with a jumbo cold forceps. Resection       and retrieval were complete.      Multiple small-mouthed diverticula were found in the sigmoid colon,       descending colon and transverse  colon.      Internal hemorrhoids were found during  endoscopy. The hemorrhoids were       small and Grade I (internal hemorrhoids that do not prolapse).      A diminutive questionable polyp was found in the rectum. The polyp was       sessile. The polyp was removed with a jumbo cold forceps. Resection and       retrieval were complete. Impression:           - One diminutive polyp in the proximal transverse                        colon, removed with a jumbo cold forceps. Resected and                        retrieved.                       - Diverticulosis in the sigmoid colon, in the                        descending colon and in the transverse colon.                       - Internal hemorrhoids.                       - One diminutive polyp in the rectum, removed with a                        jumbo cold forceps. Resected and retrieved. Recommendation:       - Await pathology results. Manya Silvas, MD 07/24/2016 8:12:01 AM This report has been signed electronically. Number of Addenda: 0 Note Initiated On: 07/24/2016 7:17 AM Scope Withdrawal Time: 0 hours 12 minutes 31 seconds  Total Procedure Duration: 0 hours 26 minutes 31 seconds       [facility]

## 2016-07-24 NOTE — Anesthesia Preprocedure Evaluation (Signed)
Anesthesia Evaluation  Patient identified by MRN, date of birth, ID band Patient awake    Reviewed: Allergy & Precautions, NPO status , Patient's Chart, lab work & pertinent test results  History of Anesthesia Complications Negative for: history of anesthetic complications  Airway Mallampati: II       Dental   Pulmonary sleep apnea ,           Cardiovascular hypertension (not for > 20 years), + Peripheral Vascular Disease       Neuro/Psych Anxiety Depression negative neurological ROS     GI/Hepatic GERD  Medicated and Controlled,  Endo/Other  Hypothyroidism   Renal/GU      Musculoskeletal   Abdominal   Peds  Hematology   Anesthesia Other Findings   Reproductive/Obstetrics                             Anesthesia Physical Anesthesia Plan  ASA: II  Anesthesia Plan: General   Post-op Pain Management:    Induction: Intravenous  Airway Management Planned: Nasal Cannula  Additional Equipment:   Intra-op Plan:   Post-operative Plan:   Informed Consent: I have reviewed the patients History and Physical, chart, labs and discussed the procedure including the risks, benefits and alternatives for the proposed anesthesia with the patient or authorized representative who has indicated his/her understanding and acceptance.     Plan Discussed with:   Anesthesia Plan Comments:         Anesthesia Quick Evaluation

## 2016-07-24 NOTE — Anesthesia Post-op Follow-up Note (Cosign Needed)
Anesthesia QCDR form completed.        

## 2016-07-24 NOTE — Anesthesia Postprocedure Evaluation (Signed)
Anesthesia Post Note  Patient: Breanna Zhang  Procedure(s) Performed: Procedure(s) (LRB): COLONOSCOPY WITH PROPOFOL (N/A)  Patient location during evaluation: Endoscopy Anesthesia Type: General Level of consciousness: awake and alert Pain management: pain level controlled Vital Signs Assessment: post-procedure vital signs reviewed and stable Respiratory status: spontaneous breathing and respiratory function stable Cardiovascular status: stable Anesthetic complications: no     Last Vitals:  Vitals:   07/24/16 0810 07/24/16 0820  BP: (!) 141/59 (!) 142/78  Pulse: 73 66  Resp: 16 (!) 23  Temp: (!) 35.6 C     Last Pain:  Vitals:   07/24/16 0810  TempSrc: Tympanic                 KEPHART,WILLIAM K

## 2016-07-25 ENCOUNTER — Encounter: Payer: Self-pay | Admitting: Unknown Physician Specialty

## 2016-07-25 LAB — SURGICAL PATHOLOGY

## 2016-07-27 DIAGNOSIS — H353211 Exudative age-related macular degeneration, right eye, with active choroidal neovascularization: Secondary | ICD-10-CM | POA: Diagnosis not present

## 2016-08-17 DIAGNOSIS — H903 Sensorineural hearing loss, bilateral: Secondary | ICD-10-CM | POA: Diagnosis not present

## 2016-08-21 ENCOUNTER — Other Ambulatory Visit: Payer: Self-pay | Admitting: Internal Medicine

## 2016-08-21 DIAGNOSIS — Z1231 Encounter for screening mammogram for malignant neoplasm of breast: Secondary | ICD-10-CM

## 2016-08-29 ENCOUNTER — Other Ambulatory Visit: Payer: Self-pay | Admitting: Psychiatry

## 2016-09-04 DIAGNOSIS — M5136 Other intervertebral disc degeneration, lumbar region: Secondary | ICD-10-CM | POA: Diagnosis not present

## 2016-09-04 DIAGNOSIS — R739 Hyperglycemia, unspecified: Secondary | ICD-10-CM | POA: Diagnosis not present

## 2016-09-04 DIAGNOSIS — I10 Essential (primary) hypertension: Secondary | ICD-10-CM | POA: Diagnosis not present

## 2016-09-04 DIAGNOSIS — E034 Atrophy of thyroid (acquired): Secondary | ICD-10-CM | POA: Diagnosis not present

## 2016-09-04 DIAGNOSIS — K219 Gastro-esophageal reflux disease without esophagitis: Secondary | ICD-10-CM | POA: Diagnosis not present

## 2016-09-11 DIAGNOSIS — Z0000 Encounter for general adult medical examination without abnormal findings: Secondary | ICD-10-CM | POA: Diagnosis not present

## 2016-09-11 DIAGNOSIS — M65312 Trigger thumb, left thumb: Secondary | ICD-10-CM | POA: Diagnosis not present

## 2016-09-11 DIAGNOSIS — Z853 Personal history of malignant neoplasm of breast: Secondary | ICD-10-CM | POA: Diagnosis not present

## 2016-09-11 DIAGNOSIS — K219 Gastro-esophageal reflux disease without esophagitis: Secondary | ICD-10-CM | POA: Diagnosis not present

## 2016-09-11 DIAGNOSIS — F3342 Major depressive disorder, recurrent, in full remission: Secondary | ICD-10-CM | POA: Diagnosis not present

## 2016-09-11 DIAGNOSIS — R42 Dizziness and giddiness: Secondary | ICD-10-CM | POA: Diagnosis not present

## 2016-09-11 DIAGNOSIS — M5136 Other intervertebral disc degeneration, lumbar region: Secondary | ICD-10-CM | POA: Diagnosis not present

## 2016-09-11 DIAGNOSIS — R739 Hyperglycemia, unspecified: Secondary | ICD-10-CM | POA: Diagnosis not present

## 2016-09-11 DIAGNOSIS — E034 Atrophy of thyroid (acquired): Secondary | ICD-10-CM | POA: Diagnosis not present

## 2016-09-11 DIAGNOSIS — G4733 Obstructive sleep apnea (adult) (pediatric): Secondary | ICD-10-CM | POA: Diagnosis not present

## 2016-09-11 DIAGNOSIS — I10 Essential (primary) hypertension: Secondary | ICD-10-CM | POA: Diagnosis not present

## 2016-09-14 DIAGNOSIS — H903 Sensorineural hearing loss, bilateral: Secondary | ICD-10-CM | POA: Diagnosis not present

## 2016-09-14 DIAGNOSIS — Z9621 Cochlear implant status: Secondary | ICD-10-CM | POA: Diagnosis not present

## 2016-09-14 DIAGNOSIS — Y722 Prosthetic and other implants, materials and accessory otorhinolaryngological devices associated with adverse incidents: Secondary | ICD-10-CM | POA: Diagnosis not present

## 2016-09-14 DIAGNOSIS — T85698A Other mechanical complication of other specified internal prosthetic devices, implants and grafts, initial encounter: Secondary | ICD-10-CM | POA: Diagnosis not present

## 2016-09-14 DIAGNOSIS — Z09 Encounter for follow-up examination after completed treatment for conditions other than malignant neoplasm: Secondary | ICD-10-CM | POA: Diagnosis not present

## 2016-09-21 DIAGNOSIS — H353211 Exudative age-related macular degeneration, right eye, with active choroidal neovascularization: Secondary | ICD-10-CM | POA: Diagnosis not present

## 2016-09-25 DIAGNOSIS — M6530 Trigger finger, unspecified finger: Secondary | ICD-10-CM | POA: Diagnosis not present

## 2016-09-27 ENCOUNTER — Ambulatory Visit
Admission: RE | Admit: 2016-09-27 | Discharge: 2016-09-27 | Disposition: A | Discharge status: Home / Self Care | Payer: PPO | Source: Ambulatory Visit | Attending: Internal Medicine | Admitting: Internal Medicine

## 2016-09-27 DIAGNOSIS — Z1231 Encounter for screening mammogram for malignant neoplasm of breast: Secondary | ICD-10-CM | POA: Insufficient documentation

## 2016-09-27 HISTORY — DX: Personal history of antineoplastic chemotherapy: Z92.21

## 2016-09-29 DIAGNOSIS — H903 Sensorineural hearing loss, bilateral: Secondary | ICD-10-CM | POA: Diagnosis not present

## 2016-10-09 ENCOUNTER — Encounter: Payer: Self-pay | Admitting: Psychiatry

## 2016-10-09 ENCOUNTER — Ambulatory Visit (INDEPENDENT_AMBULATORY_CARE_PROVIDER_SITE_OTHER): Payer: PPO | Admitting: Psychiatry

## 2016-10-09 VITALS — BP 139/72 | HR 79 | Temp 98.7°F | Wt 148.2 lb

## 2016-10-09 DIAGNOSIS — F4310 Post-traumatic stress disorder, unspecified: Secondary | ICD-10-CM | POA: Diagnosis not present

## 2016-10-09 DIAGNOSIS — F330 Major depressive disorder, recurrent, mild: Secondary | ICD-10-CM

## 2016-10-09 MED ORDER — DULOXETINE HCL 60 MG PO CPEP: 60.0000 mg | ORAL_CAPSULE | Freq: Every morning | ORAL | 1 refills | Status: DC

## 2016-10-09 MED ORDER — TRAZODONE HCL 50 MG PO TABS
50.0000 mg | ORAL_TABLET | Freq: Every evening | ORAL | 2 refills | Status: DC | PRN
Start: 2016-10-09 — End: 2017-01-08

## 2016-10-09 NOTE — Progress Notes (Signed)
Psychiatric Follow Up MD  Note  Patient Identification: Breanna Zhang MRN:  [id number] Date of Evaluation:  10/09/2016 Referral Source: Dr Bridgett Larsson  Chief Complaint:   Chief Complaint    Follow-up; Medication Refill     Visit Diagnosis:    ICD-10-CM   1. MDD (major depressive disorder), recurrent episode, mild ([place]) F33.0   2. Post traumatic stress disorder (PTSD) F43.10    Diagnosis:   Patient Active Problem List   Diagnosis Date Noted  . Personal history of malignant neoplasm of breast [Z85.3] 09/08/2015  . Adult hypothyroidism [E03.9] 04/06/2015  . Blood glucose elevated [R73.9] 09/07/2014  . 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 Clinical depression [F32.9] 06/22/2014  . Acid reflux [K21.9] 06/22/2014  . BP (high blood pressure) [I10] 06/22/2014  . Malignant neoplasm of breast ([place]) [C50.919] 06/22/2014  . Frequent UTI [N39.0] 06/22/2014  . Apnea, sleep [G47.30] 06/22/2014  . Head revolving around [R42] 06/22/2014  . Dizziness [R42] 04/16/2014  . 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 Neuritis or radiculitis due to rupture of lumbar intervertebral disc [M51.16] 10/06/2013  . 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 Cystocele, midline [N81.11] 11/16/2011  . Female genuine stress incontinence [N39.3] 11/16/2011  . Incomplete bladder emptying [R33.9] 11/16/2011  . 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 Excessive urination at night [R35.1] 11/16/2011  . Neuralgia neuritis, sciatic nerve [M54.30] 11/16/2011  . Urge incontinence of urine [N39.41] 11/16/2011   History of Present Illness:  Patient is a 76 year old married female who presented for follow-up. She Reported that she has been doing well on her medications. She reported that she recently had her physical exam done and it went well. She does not have any acute issues. She reported that she continues to have some anxiety with her previous interaction  at her daughter's house. She reported that she has discussed with her daughter who has also been feeling guilty. She reported that she goes to her daughter house and has been playing with her grandchildren. She does not want to take any medications. She is also having some problems with sleep due to recent death at her church. She is interested in taking medications to help her sleep at night. She currently denied having any suicidal homicidal ideations or plans.  She has supportive family and they have been helping her. She reported that she will call if she notices worsening of her anxiety symptoms at this time.   She appeared calm and alert during the interview.    Elements:  Severity:  mild. Associated Signs/Symptoms: Depression Symptoms:  loss of energy/fatigue, (Hypo) Manic Symptoms:  none Anxiety Symptoms:  Excessive Worry, Psychotic Symptoms:  none PTSD Symptoms: Negative NA  Past Medical History:  Past Medical History:  Diagnosis Date  . Anxiety   . Atrial fibrillation ([place])   . Breast cancer ([place]) 1999   RT MASTECTOMY  . DDD (degenerative disc disease), cervical   . Depression   . Diverticulosis   . Diverticulosis   . DVT of lower extremity (deep venous thrombosis) ([place])   . GERD (gastroesophageal reflux disease)   . Goiter   . History of chemotherapy 2000   BREAST CA  . OSA (obstructive sleep apnea)   . Osteoporosis   . Personal history of chemotherapy 1999   BREAST CA  . Status post chemotherapy    2000 right breast cancer  . Thyroid disease   . UTI (urinary tract infection)   . Vertigo  Past Surgical History:  Procedure Laterality Date  . ABDOMINAL HYSTERECTOMY    . BREAST BIOPSY Left 2006   negative  . CHOLECYSTECTOMY    . COCHLEAR IMPLANT Right   . COLONOSCOPY WITH PROPOFOL N/A 07/24/2016   Procedure: COLONOSCOPY WITH PROPOFOL;  Surgeon: Manya Silvas, MD;  Location[facility] ENDOSCOPY;  Service: Endoscopy;  Laterality: N/A;  .  ESOPHAGOGASTRODUODENOSCOPY (EGD) WITH PROPOFOL N/A 04/12/2016   Procedure: ESOPHAGOGASTRODUODENOSCOPY (EGD) WITH PROPOFOL;  Surgeon: Manya Silvas, MD;  Location: [facility] ENDOSCOPY;  Service: Endoscopy;  Laterality: N/A;  . EYE SURGERY    . FOOT SURGERY Bilateral   . HAND SURGERY Right   . MASTECTOMY Right 1999   BREAST CA  . PARTIAL COLECTOMY    . THYROIDECTOMY     Family History:  Family History  Problem Relation Age of Onset  . Dementia Mother   . Dementia Sister   . Diabetes Sister   . Heart attack Brother   . COPD Brother   . Colon cancer Brother   . Liver cancer Brother   . COPD Brother   . Breast cancer Neg Hx    Social History:   Social History   Social History  . Marital status: Married    Spouse name: N/A  . Number of children: N/A  . Years of education: N/A   Social History Main Topics  . Smoking status: Never Smoker  . Smokeless tobacco: Never Used  . Alcohol use No  . Drug use: No  . Sexual activity: No   Other Topics Concern  . None   Social History Narrative  . None   Additional Social History: Married x 4.  Husband is alcoholic. She is concerned about is behavoir. 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  Has 4 children.   Musculoskeletal: Strength & Muscle Tone: within normal limits Gait & Station: normal Patient leans: N/A  Psychiatric Specialty Exam: Medication Refill     ROS  Blood pressure 139/72, pulse 79, temperature 98.7 F (37.1 C), temperature source Oral, weight 148 lb 3.2 oz (67.2 kg).Body mass index is 23.92 kg/m.  General Appearance: Casual  Eye Contact:  Fair  Speech:  Clear and Coherent  Volume:  Normal  Mood:  Anxious  Affect:  Congruent  Thought Process:  Coherent and Goal Directed  Orientation:  Full (Time, Place, and Person)  Thought Content:  WDL  Suicidal Thoughts:  No  Homicidal Thoughts:  No  Memory:  Immediate;   Fair  Judgement:  Fair  Insight:  Fair  Psychomotor Activity:   Normal  Concentration:  Fair  Recall:  [employer] [place]  Language: Fair  Akathisia:  No  Handed:  Right  AIMS (if indicated):    Assets:  Communication Skills Desire for Improvement Physical Health Social Support Transportation  ADL's:  Intact  Cognition: WNL  Sleep:  7-8   Is the patient at risk to self?  No. Has the patient been a risk to self in the past 6 months?  No. Has the patient been a risk to self within the distant past?  No. Is the patient a risk to others?  No. Has the patient been a risk to others in the past 6 months?  No. Has the patient been a risk to others within the distant past?  No.  Allergies:   Allergies  Allergen Reactions  . Imipramine Tinitus  . Latex Rash  . Penicillins Rash  .  Tape Rash   Current Medications: Current Outpatient Prescriptions  Medication Sig Dispense Refill  . atorvastatin (LIPITOR) 40 MG tablet Take 40 mg by mouth at bedtime.    Marland Kitchen azelastine (ASTELIN) 0.1 % nasal spray PLACE 1 SPRAY INTO BOTH NOSTRILS 2 (TWO) TIMES DAILY FOR 7 DAYS. THEN TWICE A DAY AS NEEDED  5  . cholecalciferol (VITAMIN D) 1000 units tablet Take 1,000 Units by mouth daily.    . cyanocobalamin (V-R VITAMIN B-12) 500 MCG tablet Take 500 mg by mouth daily.    . DULoxetine (CYMBALTA) 60 MG capsule Take 1 capsule (60 mg total) by mouth every morning. 90 capsule 1  . pantoprazole (PROTONIX) 40 MG tablet Take 40 mg by mouth daily.  11  . SYNTHROID 88 MCG tablet Take 88 mcg by mouth daily.  11  . traZODone (DESYREL) 50 MG tablet Take 1 tablet (50 mg total) by mouth at bedtime as needed for sleep. 30 tablet 2   No current facility-administered medications for this visit.     Previous Psychotropic Medications: as reported  Substance Abuse History in the last 12 months:  No.  Consequences of Substance Abuse: Negative NA  Medical Decision Making:  Review of Psycho-Social Stressors (1) and Review and summation of old records (2)  Treatment Plan  Summary: Medication management    Continue  Cymbalta 60 mg daily I will start her on trazodone 25-50 mg by mouth daily at bedtime when necessary for insomnia. She will follow-up in 3 months or earlier depending on her symptoms   More than 50% of the time spent in psychoeducation, counseling and coordination of care.    This note was generated in part or whole with voice recognition software. Voice regonition is usually quite accurate but there are transcription errors that can and very often do occur. I apologize for any typographical errors that were not detected and corrected.   Rainey Pines, MD   7/30/201811:13 AM

## 2016-10-13 DIAGNOSIS — H35373 Puckering of macula, bilateral: Secondary | ICD-10-CM | POA: Diagnosis not present

## 2016-10-13 DIAGNOSIS — H353221 Exudative age-related macular degeneration, left eye, with active choroidal neovascularization: Secondary | ICD-10-CM | POA: Diagnosis not present

## 2016-10-18 DIAGNOSIS — M6530 Trigger finger, unspecified finger: Secondary | ICD-10-CM | POA: Diagnosis not present

## 2016-11-20 DIAGNOSIS — H353211 Exudative age-related macular degeneration, right eye, with active choroidal neovascularization: Secondary | ICD-10-CM | POA: Diagnosis not present

## 2016-11-27 ENCOUNTER — Ambulatory Visit: Payer: PPO | Admitting: Psychiatry

## 2016-12-18 ENCOUNTER — Ambulatory Visit: Payer: PPO | Admitting: Psychiatry

## 2017-01-02 ENCOUNTER — Telehealth: Payer: Self-pay | Admitting: Psychiatry

## 2017-01-08 ENCOUNTER — Ambulatory Visit (INDEPENDENT_AMBULATORY_CARE_PROVIDER_SITE_OTHER): Payer: PPO | Admitting: Psychiatry

## 2017-01-08 ENCOUNTER — Encounter: Payer: Self-pay | Admitting: Psychiatry

## 2017-01-08 ENCOUNTER — Other Ambulatory Visit
Admission: RE | Admit: 2017-01-08 | Discharge: 2017-01-08 | Disposition: A | Discharge status: Home / Self Care | Payer: PPO | Source: Ambulatory Visit | Attending: Physician Assistant | Admitting: Physician Assistant

## 2017-01-08 VITALS — BP 177/79 | HR 88 | Temp 98.7°F | Wt 152.4 lb

## 2017-01-08 DIAGNOSIS — F330 Major depressive disorder, recurrent, mild: Secondary | ICD-10-CM

## 2017-01-08 DIAGNOSIS — R42 Dizziness and giddiness: Secondary | ICD-10-CM | POA: Diagnosis not present

## 2017-01-08 DIAGNOSIS — R0789 Other chest pain: Secondary | ICD-10-CM | POA: Diagnosis not present

## 2017-01-08 DIAGNOSIS — I10 Essential (primary) hypertension: Secondary | ICD-10-CM | POA: Diagnosis not present

## 2017-01-08 DIAGNOSIS — H903 Sensorineural hearing loss, bilateral: Secondary | ICD-10-CM | POA: Diagnosis not present

## 2017-01-08 DIAGNOSIS — F411 Generalized anxiety disorder: Secondary | ICD-10-CM

## 2017-01-08 LAB — TROPONIN I: Troponin I: <0.03 ng/mL (ref <0.03)

## 2017-01-08 MED ORDER — DULOXETINE HCL 60 MG PO CPEP: 60.0000 mg | ORAL_CAPSULE | Freq: Every morning | ORAL | 1 refills | Status: DC

## 2017-01-08 MED ORDER — TRAZODONE HCL 50 MG PO TABS: 50.0000 mg | ORAL_TABLET | Freq: Every evening | ORAL | 2 refills | Status: DC | PRN

## 2017-01-08 NOTE — Progress Notes (Signed)
Psychiatric Follow Up MD  Note  Patient Identification: Breanna Zhang MRN:  [id number] Date of Evaluation:  01/08/2017 Referral Source: Dr Bridgett Larsson  Chief Complaint:   Chief Complaint    Follow-up; Medication Refill     Visit Diagnosis:    ICD-10-CM   1. MDD (major depressive disorder), recurrent episode, mild ([place]) F33.0   2. Generalized anxiety disorder F41.1    Diagnosis:   Patient Active Problem List   Diagnosis Date Noted  . Personal history of malignant neoplasm of breast [Z85.3] 09/08/2015  . Adult hypothyroidism [E03.9] 04/06/2015  . Blood glucose elevated [R73.9] 09/07/2014  . 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 Clinical depression [F32.9] 06/22/2014  . Acid reflux [K21.9] 06/22/2014  . BP (high blood pressure) [I10] 06/22/2014  . Malignant neoplasm of breast ([place]) [C50.919] 06/22/2014  . Frequent UTI [N39.0] 06/22/2014  . Apnea, sleep [G47.30] 06/22/2014  . Head revolving around [R42] 06/22/2014  . Dizziness [R42] 04/16/2014  . 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 Neuritis or radiculitis due to rupture of lumbar intervertebral disc [M51.16] 10/06/2013  . 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 Cystocele, midline [N81.11] 11/16/2011  . Female genuine stress incontinence [N39.3] 11/16/2011  . Incomplete bladder emptying [R33.9] 11/16/2011  . 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 Excessive urination at night [R35.1] 11/16/2011  . Neuralgia neuritis, sciatic nerve [M54.30] 11/16/2011  . Urge incontinence of urine [N39.41] 11/16/2011   History of Present Illness:  Patient is a 76 year old married female who presented for follow-up. She appeared tired this morning. She reported that she was having a difficult time waking up as she was busy last evening attended the preacher dinner at the church. She reported that she was also noted to have elevated blood pressure. She reported that most of the  time her blood pressure is within normal limits. She is concerned about the same. She is planning to follow up with her primary care physician. Patient reported that she has been compliant with her medications. She does not take trazodone on a regular basis. She reported that she takes Cymbalta in the morning. She was talking in detail about her holidays as she has been trying to spend time with her family. She usually takes care of her best friend who has been sick and she visits her on a daily basis. Patient reported that she also takes care of her grandchildren in the afternoon. She reported that she has been trying to lose weight and has been watching her diet. She has gained a few pounds in the past few months as she has been cooking meals for her grandchildren. Patient currently denied having any suicidal homicidal ideations or plans. She denied having any perceptual disturbances.       Elements:  Severity:  mild. Associated Signs/Symptoms: Depression Symptoms:  loss of energy/fatigue, (Hypo) Manic Symptoms:  none Anxiety Symptoms:  Excessive Worry, Psychotic Symptoms:  none PTSD Symptoms: Negative NA  Past Medical History:  Past Medical History:  Diagnosis Date  . Anxiety   . Atrial fibrillation ([place])   . Breast cancer ([place]) 1999   RT MASTECTOMY  . DDD (degenerative disc disease), cervical   . Depression   . Diverticulosis   . Diverticulosis   . DVT of lower extremity (deep venous thrombosis) ([place])   . GERD (gastroesophageal reflux disease)   . Goiter   . History of chemotherapy 2000   BREAST CA  . OSA (obstructive sleep apnea)   .  Osteoporosis   . Personal history of chemotherapy 1999   BREAST CA  . Status post chemotherapy    2000 right breast cancer  . Thyroid disease   . UTI (urinary tract infection)   . Vertigo     Past Surgical History:  Procedure Laterality Date  . ABDOMINAL HYSTERECTOMY    . BREAST BIOPSY Left 2006   negative  . CHOLECYSTECTOMY    .  COCHLEAR IMPLANT Right   . COLONOSCOPY WITH PROPOFOL N/A 07/24/2016   Procedure: COLONOSCOPY WITH PROPOFOL;  Surgeon: Manya Silvas, MD;  Location[facility] ENDOSCOPY;  Service: Endoscopy;  Laterality: N/A;  . ESOPHAGOGASTRODUODENOSCOPY (EGD) WITH PROPOFOL N/A 04/12/2016   Procedure: ESOPHAGOGASTRODUODENOSCOPY (EGD) WITH PROPOFOL;  Surgeon: Manya Silvas, MD;  Location: [facility] ENDOSCOPY;  Service: Endoscopy;  Laterality: N/A;  . EYE SURGERY    . FOOT SURGERY Bilateral   . HAND SURGERY Right   . MASTECTOMY Right 1999   BREAST CA  . PARTIAL COLECTOMY    . THYROIDECTOMY     Family History:  Family History  Problem Relation Age of Onset  . Dementia Mother   . Dementia Sister   . Diabetes Sister   . Heart attack Brother   . COPD Brother   . Colon cancer Brother   . Liver cancer Brother   . COPD Brother   . Breast cancer Neg Hx    Social History:   Social History   Social History  . Marital status: Married    Spouse name: N/A  . Number of children: N/A  . Years of education: N/A   Social History Main Topics  . Smoking status: Never Smoker  . Smokeless tobacco: Never Used  . Alcohol use No  . Drug use: No  . Sexual activity: No   Other Topics Concern  . None   Social History Narrative  . None   Additional Social History: Married x 4.  Husband is alcoholic. She is concerned about is behavoir. 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  Has 4 children.   Musculoskeletal: Strength & Muscle Tone: within normal limits Gait & Station: normal Patient leans: N/A  Psychiatric Specialty Exam: Medication Refill     ROS  Blood pressure (!) 177/79, pulse 88, temperature 98.7 F (37.1 C), temperature source Oral, weight 152 lb 6.4 oz (69.1 kg).Body mass index is 24.6 kg/m.  General Appearance: Casual  Eye Contact:  Fair  Speech:  Clear and Coherent  Volume:  Normal  Mood:  Anxious  Affect:  Congruent  Thought Process:  Coherent and Goal  Directed  Orientation:  Full (Time, Place, and Person)  Thought Content:  WDL  Suicidal Thoughts:  No  Homicidal Thoughts:  No  Memory:  Immediate;   Fair  Judgement:  Fair  Insight:  Fair  Psychomotor Activity:  Normal  Concentration:  Fair  Recall:  [employer] [place]  Language: Fair  Akathisia:  No  Handed:  Right  AIMS (if indicated):    Assets:  Communication Skills Desire for Improvement Physical Health Social Support Transportation  ADL's:  Intact  Cognition: WNL  Sleep:  7-8   Is the patient at risk to self?  No. Has the patient been a risk to self in the past 6 months?  No. Has the patient been a risk to self within the distant past?  No. Is the patient a risk to others?  No. Has the patient been a risk  to others in the past 6 months?  No. Has the patient been a risk to others within the distant past?  No.  Allergies:   Allergies  Allergen Reactions  . Imipramine Tinitus  . Latex Rash  . Penicillins Rash  . Tape Rash   Current Medications: Current Outpatient Prescriptions  Medication Sig Dispense Refill  . atorvastatin (LIPITOR) 40 MG tablet Take 40 mg by mouth at bedtime.    Marland Kitchen azelastine (ASTELIN) 0.1 % nasal spray PLACE 1 SPRAY INTO BOTH NOSTRILS 2 (TWO) TIMES DAILY FOR 7 DAYS. THEN TWICE A DAY AS NEEDED  5  . cholecalciferol (VITAMIN D) 1000 units tablet Take 1,000 Units by mouth daily.    . cyanocobalamin (V-R VITAMIN B-12) 500 MCG tablet Take 500 mg by mouth daily.    . DULoxetine (CYMBALTA) 60 MG capsule Take 1 capsule (60 mg total) by mouth every morning. 90 capsule 1  . pantoprazole (PROTONIX) 40 MG tablet Take 40 mg by mouth daily.  11  . SYNTHROID 88 MCG tablet Take 88 mcg by mouth daily.  11  . traZODone (DESYREL) 50 MG tablet Take 1 tablet (50 mg total) by mouth at bedtime as needed for sleep. 30 tablet 2   No current facility-administered medications for this visit.     Previous Psychotropic Medications: as reported  Substance  Abuse History in the last 12 months:  No.  Consequences of Substance Abuse: Negative NA  Medical Decision Making:  Review of Psycho-Social Stressors (1) and Review and summation of old records (2)  Treatment Plan Summary: Medication management    Continue  Cymbalta 60 mg daily Continue  trazodone 25-50 mg by mouth daily at bedtime when necessary for insomnia. She will follow-up in 3 months or earlier depending on her symptoms   More than 50% of the time spent in psychoeducation, counseling and coordination of care.    This note was generated in part or whole with voice recognition software. Voice regonition is usually quite accurate but there are transcription errors that can and very often do occur. I apologize for any typographical errors that were not detected and corrected.   Rainey Pines, MD   10/29/20189:39 AM

## 2017-01-15 DIAGNOSIS — H353221 Exudative age-related macular degeneration, left eye, with active choroidal neovascularization: Secondary | ICD-10-CM | POA: Diagnosis not present

## 2017-01-29 DIAGNOSIS — H353211 Exudative age-related macular degeneration, right eye, with active choroidal neovascularization: Secondary | ICD-10-CM | POA: Diagnosis not present

## 2017-01-30 ENCOUNTER — Other Ambulatory Visit: Payer: Self-pay | Admitting: Psychiatry

## 2017-01-30 DIAGNOSIS — F5105 Insomnia due to other mental disorder: Secondary | ICD-10-CM

## 2017-01-30 MED ORDER — TRAZODONE HCL 50 MG PO TABS: 50.0000 mg | ORAL_TABLET | Freq: Every evening | ORAL | 1 refills | Status: DC | PRN

## 2017-01-30 NOTE — Telephone Encounter (Signed)
Received a fax requesting a 90 day supply of trazodone.   traZODone (DESYREL) 50 MG tablet 30 tablet 2 01/08/2017    Sig - Route: Take 1 tablet (50 mg total) by mouth at bedtime as needed for sleep. - Oral   Sent to pharmacy as: traZODone (DESYREL) 50 MG tablet   E-Prescribing Status: Receipt confirmed by pharmacy (01/08/2017 9:25 AM EDT)

## 2017-01-30 NOTE — Telephone Encounter (Signed)
Trazodone 50 mg sent to pharmacy. Reviewed notes in EHR.

## 2017-01-31 ENCOUNTER — Other Ambulatory Visit: Payer: Self-pay | Admitting: Psychiatry

## 2017-01-31 DIAGNOSIS — F5105 Insomnia due to other mental disorder: Secondary | ICD-10-CM

## 2017-01-31 MED ORDER — TRAZODONE HCL 50 MG PO TABS: 50.0000 mg | ORAL_TABLET | Freq: Every evening | ORAL | 1 refills | Status: DC | PRN

## 2017-01-31 NOTE — Telephone Encounter (Signed)
Sent 90 days.

## 2017-01-31 NOTE — Telephone Encounter (Signed)
received a fax requesting a 90 day supply of trazodone. pt was given a 30 day supply with 0ne refill      Disp Refills Start End   traZODone (DESYREL) 50 MG tablet 30 tablet 1 01/30/2017    Sig - Route: Take 1 tablet (50 mg total) by mouth at bedtime as needed for sleep. - Oral   Sent to pharmacy as: traZODone (DESYREL) 50 MG tablet   E-Prescribing Status: Receipt confirmed by pharmacy (01/30/2017 3:13 PM EST)

## 2017-01-31 NOTE — Telephone Encounter (Signed)
sent 

## 2017-02-07 DIAGNOSIS — H903 Sensorineural hearing loss, bilateral: Secondary | ICD-10-CM | POA: Diagnosis not present

## 2017-02-26 DIAGNOSIS — R519 Headache, unspecified: Secondary | ICD-10-CM | POA: Insufficient documentation

## 2017-02-26 DIAGNOSIS — R51 Headache: Secondary | ICD-10-CM

## 2017-02-27 ENCOUNTER — Other Ambulatory Visit: Payer: Self-pay | Admitting: Internal Medicine

## 2017-02-27 ENCOUNTER — Ambulatory Visit
Admission: RE | Admit: 2017-02-27 | Discharge: 2017-02-27 | Disposition: A | Discharge status: Home / Self Care | Payer: PPO | Source: Ambulatory Visit | Attending: Internal Medicine | Admitting: Internal Medicine

## 2017-02-27 DIAGNOSIS — I10 Essential (primary) hypertension: Secondary | ICD-10-CM | POA: Diagnosis not present

## 2017-02-27 DIAGNOSIS — R519 Headache, unspecified: Secondary | ICD-10-CM

## 2017-02-27 DIAGNOSIS — F3342 Major depressive disorder, recurrent, in full remission: Secondary | ICD-10-CM | POA: Diagnosis not present

## 2017-02-27 DIAGNOSIS — R51 Headache: Secondary | ICD-10-CM | POA: Diagnosis not present

## 2017-02-27 DIAGNOSIS — Z978 Presence of other specified devices: Secondary | ICD-10-CM | POA: Insufficient documentation

## 2017-03-12 DIAGNOSIS — H903 Sensorineural hearing loss, bilateral: Secondary | ICD-10-CM | POA: Diagnosis not present

## 2017-03-15 DIAGNOSIS — G4733 Obstructive sleep apnea (adult) (pediatric): Secondary | ICD-10-CM | POA: Diagnosis not present

## 2017-03-15 DIAGNOSIS — R739 Hyperglycemia, unspecified: Secondary | ICD-10-CM | POA: Diagnosis not present

## 2017-03-15 DIAGNOSIS — M5136 Other intervertebral disc degeneration, lumbar region: Secondary | ICD-10-CM | POA: Diagnosis not present

## 2017-03-15 DIAGNOSIS — I10 Essential (primary) hypertension: Secondary | ICD-10-CM | POA: Diagnosis not present

## 2017-03-15 DIAGNOSIS — E034 Atrophy of thyroid (acquired): Secondary | ICD-10-CM | POA: Diagnosis not present

## 2017-03-15 DIAGNOSIS — Z853 Personal history of malignant neoplasm of breast: Secondary | ICD-10-CM | POA: Diagnosis not present

## 2017-03-15 DIAGNOSIS — K219 Gastro-esophageal reflux disease without esophagitis: Secondary | ICD-10-CM | POA: Diagnosis not present

## 2017-03-19 DIAGNOSIS — F3342 Major depressive disorder, recurrent, in full remission: Secondary | ICD-10-CM | POA: Diagnosis not present

## 2017-03-19 DIAGNOSIS — I10 Essential (primary) hypertension: Secondary | ICD-10-CM | POA: Diagnosis not present

## 2017-03-19 DIAGNOSIS — R42 Dizziness and giddiness: Secondary | ICD-10-CM | POA: Diagnosis not present

## 2017-03-19 DIAGNOSIS — G4733 Obstructive sleep apnea (adult) (pediatric): Secondary | ICD-10-CM | POA: Diagnosis not present

## 2017-03-19 DIAGNOSIS — K219 Gastro-esophageal reflux disease without esophagitis: Secondary | ICD-10-CM | POA: Diagnosis not present

## 2017-03-19 DIAGNOSIS — R739 Hyperglycemia, unspecified: Secondary | ICD-10-CM | POA: Diagnosis not present

## 2017-03-19 DIAGNOSIS — E034 Atrophy of thyroid (acquired): Secondary | ICD-10-CM | POA: Diagnosis not present

## 2017-03-26 DIAGNOSIS — H353221 Exudative age-related macular degeneration, left eye, with active choroidal neovascularization: Secondary | ICD-10-CM | POA: Diagnosis not present

## 2017-03-29 DIAGNOSIS — G4733 Obstructive sleep apnea (adult) (pediatric): Secondary | ICD-10-CM | POA: Diagnosis not present

## 2017-04-02 DIAGNOSIS — H353211 Exudative age-related macular degeneration, right eye, with active choroidal neovascularization: Secondary | ICD-10-CM | POA: Diagnosis not present

## 2017-04-09 ENCOUNTER — Encounter: Payer: Self-pay | Admitting: Psychiatry

## 2017-04-09 ENCOUNTER — Other Ambulatory Visit: Payer: Self-pay

## 2017-04-09 ENCOUNTER — Ambulatory Visit: Payer: PPO | Admitting: Psychiatry

## 2017-04-09 VITALS — BP 146/78 | HR 82 | Temp 98.6°F | Wt 153.6 lb

## 2017-04-09 DIAGNOSIS — F331 Major depressive disorder, recurrent, moderate: Secondary | ICD-10-CM

## 2017-04-09 DIAGNOSIS — F5105 Insomnia due to other mental disorder: Secondary | ICD-10-CM

## 2017-04-09 DIAGNOSIS — Z7189 Other specified counseling: Secondary | ICD-10-CM

## 2017-04-09 MED ORDER — DULOXETINE HCL 60 MG PO CPEP: 60.0000 mg | ORAL_CAPSULE | Freq: Every morning | ORAL | 1 refills | Status: DC

## 2017-04-09 MED ORDER — TRAZODONE HCL 100 MG PO TABS: 100.0000 mg | ORAL_TABLET | Freq: Every evening | ORAL | 1 refills | Status: DC | PRN

## 2017-04-09 NOTE — Progress Notes (Signed)
Psychiatric Follow Up MD  Note  Patient Identification: Breanna Zhang MRN:  [id number] Date of Evaluation:  04/09/2017 Referral Source: Dr Bridgett Larsson  Chief Complaint:    Visit Diagnosis:    ICD-10-CM   1. MDD (major depressive disorder), recurrent episode, moderate (HCC) F33.1   2. Bereavement counseling Z71.89    Diagnosis:   Patient Active Problem List   Diagnosis Date Noted  . Personal history of malignant neoplasm of breast [Z85.3] 09/08/2015  . Adult hypothyroidism [E03.9] 04/06/2015  . Blood glucose elevated [R73.9] 09/07/2014  . 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 Clinical depression [F32.9] 06/22/2014  . Acid reflux [K21.9] 06/22/2014  . BP (high blood pressure) [I10] 06/22/2014  . Malignant neoplasm of breast ([place]) [C50.919] 06/22/2014  . Frequent UTI [N39.0] 06/22/2014  . Apnea, sleep [G47.30] 06/22/2014  . Head revolving around [R42] 06/22/2014  . Dizziness [R42] 04/16/2014  . 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 Neuritis or radiculitis due to rupture of lumbar intervertebral disc [M51.16] 10/06/2013  . 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 Cystocele, midline [N81.11] 11/16/2011  . Female genuine stress incontinence [N39.3] 11/16/2011  . Incomplete bladder emptying [R33.9] 11/16/2011  . 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 Excessive urination at night [R35.1] 11/16/2011  . Neuralgia neuritis, sciatic nerve [M54.30] 11/16/2011  . Urge incontinence of urine [N39.41] 11/16/2011   History of Present Illness:  Patient is a 77 year old married female who presented for follow-up. She reported that she has been becoming very anxious and depressed since October after her sister-in-law passed away. She reported that she barely made it through the Christmas as she has been having increased anxiety. She reported that several of her friends also died and one of her friend is in hospice. She has been  having extreme anxiety and she cannot even pick up the phone as she is worried about another bad news. Her husband is very supportive. She went to her primary care physician who has done the MRI and it came back negative. Patient reported that she feels apprehensive all the time and wants her medications to be adjusted. She has poor sleep. She reported that she feels numb and does not know what is going to happen next. We had a discussion about death and dying for a long time during the interview and she was receptive about the same. She reported that she is not afraid of dying but is anxious about all her friends have been going through death  recently    She is willing to talk to her family and her friends and her husband about support and remains receptive. She denied having any suicidal homicidal ideations or plans. She denied having any perceptual disturbances.     Elements:  Severity:  mild. Associated Signs/Symptoms: Depression Symptoms:  loss of energy/fatigue, (Hypo) Manic Symptoms:  none Anxiety Symptoms:  Excessive Worry, Psychotic Symptoms:  none PTSD Symptoms: Negative NA  Past Medical History:  Past Medical History:  Diagnosis Date  . Anxiety   . Atrial fibrillation ([place])   . Breast cancer ([place]) 1999   RT MASTECTOMY  . DDD (degenerative disc disease), cervical   . Depression   . Diverticulosis   . Diverticulosis   . DVT of lower extremity (deep venous thrombosis) ([place])   . GERD (gastroesophageal reflux disease)   . Goiter   . History of chemotherapy 2000   BREAST CA  . OSA (obstructive sleep apnea)   . Osteoporosis   .  Personal history of chemotherapy 1999   BREAST CA  . Status post chemotherapy    2000 right breast cancer  . Thyroid disease   . UTI (urinary tract infection)   . Vertigo     Past Surgical History:  Procedure Laterality Date  . ABDOMINAL HYSTERECTOMY    . BREAST BIOPSY Left 2006   negative  . CHOLECYSTECTOMY    . COCHLEAR IMPLANT Right    . COLONOSCOPY WITH PROPOFOL N/A 07/24/2016   Procedure: COLONOSCOPY WITH PROPOFOL;  Surgeon: Manya Silvas, MD;  Location: [facility] ENDOSCOPY;  Service: Endoscopy;  Laterality: N/A;  . ESOPHAGOGASTRODUODENOSCOPY (EGD) WITH PROPOFOL N/A 04/12/2016   Procedure: ESOPHAGOGASTRODUODENOSCOPY (EGD) WITH PROPOFOL;  Surgeon: Manya Silvas, MD;  Location: [facility] ENDOSCOPY;  Service: Endoscopy;  Laterality: N/A;  . EYE SURGERY    . FOOT SURGERY Bilateral   . HAND SURGERY Right   . MASTECTOMY Right 1999   BREAST CA  . PARTIAL COLECTOMY    . THYROIDECTOMY     Family History:  Family History  Problem Relation Age of Onset  . Dementia Mother   . Dementia Sister   . Diabetes Sister   . Heart attack Brother   . COPD Brother   . Colon cancer Brother   . Liver cancer Brother   . COPD Brother   . Breast cancer Neg Hx    Social History:   Social History   Socioeconomic History  . Marital status: Married    Spouse name: Not on file  . Number of children: Not on file  . Years of education: Not on file  . Highest education level: Not on file  Social Needs  . Financial resource strain: Not on file  . Food insecurity - worry: Not on file  . Food insecurity - inability: Not on file  . Transportation needs - medical: Not on file  . Transportation needs - non-medical: Not on file  Occupational History  . Not on file  Tobacco Use  . Smoking status: Never Smoker  . Smokeless tobacco: Never Used  Substance and Sexual Activity  . Alcohol use: No    Alcohol/week: 0.0 oz  . Drug use: No  . Sexual activity: No  Other Topics Concern  . Not on file  Social History Narrative  . Not on file   Additional Social History: Married x 4.  Husband is alcoholic. She is concerned about is behavoir. 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  Has 4 children.   Musculoskeletal: Strength & Muscle Tone: within normal limits Gait & Station: normal Patient leans: N/A  Psychiatric  Specialty Exam: Medication Refill     ROS  There were no vitals taken for this visit.There is no height or weight on file to calculate BMI.  General Appearance: Casual  Eye Contact:  Fair  Speech:  Clear and Coherent  Volume:  Normal  Mood:  Anxious  Affect:  Congruent  Thought Process:  Coherent and Goal Directed  Orientation:  Full (Time, Place, and Person)  Thought Content:  WDL  Suicidal Thoughts:  No  Homicidal Thoughts:  No  Memory:  Immediate;   Fair  Judgement:  Fair  Insight:  Fair  Psychomotor Activity:  Normal  Concentration:  Fair  Recall:  [employer] [place]  Language: Fair  Akathisia:  No  Handed:  Right  AIMS (if indicated):    Assets:  Communication Skills Desire for Improvement Physical Health Social Support  Transportation  ADL's:  Intact  Cognition: WNL  Sleep:  7-8   Is the patient at risk to self?  No. Has the patient been a risk to self in the past 6 months?  No. Has the patient been a risk to self within the distant past?  No. Is the patient a risk to others?  No. Has the patient been a risk to others in the past 6 months?  No. Has the patient been a risk to others within the distant past?  No.  Allergies:   Allergies  Allergen Reactions  . Imipramine Tinitus  . Latex Rash  . Penicillins Rash  . Tape Rash   Current Medications: Current Outpatient Medications  Medication Sig Dispense Refill  . atorvastatin (LIPITOR) 40 MG tablet Take 40 mg by mouth at bedtime.    Marland Kitchen azelastine (ASTELIN) 0.1 % nasal spray PLACE 1 SPRAY INTO BOTH NOSTRILS 2 (TWO) TIMES DAILY FOR 7 DAYS. THEN TWICE A DAY AS NEEDED  5  . cholecalciferol (VITAMIN D) 1000 units tablet Take 1,000 Units by mouth daily.    . cyanocobalamin (V-R VITAMIN B-12) 500 MCG tablet Take 500 mg by mouth daily.    . DULoxetine (CYMBALTA) 60 MG capsule Take 1 capsule (60 mg total) by mouth every morning. 90 capsule 1  . pantoprazole (PROTONIX) 40 MG tablet Take 40 mg by mouth  daily.  11  . SYNTHROID 88 MCG tablet Take 88 mcg by mouth daily.  11  . traZODone (DESYREL) 50 MG tablet Take 1 tablet (50 mg total) by mouth at bedtime as needed for sleep. 90 tablet 1   No current facility-administered medications for this visit.     Previous Psychotropic Medications: as reported  Substance Abuse History in the last 12 months:  No.  Consequences of Substance Abuse: Negative NA  Medical Decision Making:  Review of Psycho-Social Stressors (1) and Review and summation of old records (2)  Treatment Plan Summary: Medication management    Continue  Cymbalta 60 mg daily Continue  trazodone 100 mg by mouth daily at bedtime when necessary for insomnia. She will follow-up in 2 months or earlier depending on her symptoms   More than 50% of the time spent in psychoeducation, counseling and coordination of care.    This note was generated in part or whole with voice recognition software. Voice regonition is usually quite accurate but there are transcription errors that can and very often do occur. I apologize for any typographical errors that were not detected and corrected.   Rainey Pines, MD   1/28/20199:51 AM

## 2017-04-10 DIAGNOSIS — E034 Atrophy of thyroid (acquired): Secondary | ICD-10-CM | POA: Diagnosis not present

## 2017-04-10 DIAGNOSIS — I10 Essential (primary) hypertension: Secondary | ICD-10-CM | POA: Diagnosis not present

## 2017-04-10 DIAGNOSIS — G4733 Obstructive sleep apnea (adult) (pediatric): Secondary | ICD-10-CM | POA: Diagnosis not present

## 2017-04-10 DIAGNOSIS — R51 Headache: Secondary | ICD-10-CM | POA: Diagnosis not present

## 2017-04-10 DIAGNOSIS — F3342 Major depressive disorder, recurrent, in full remission: Secondary | ICD-10-CM | POA: Diagnosis not present

## 2017-04-10 DIAGNOSIS — M5136 Other intervertebral disc degeneration, lumbar region: Secondary | ICD-10-CM | POA: Diagnosis not present

## 2017-04-12 DIAGNOSIS — H903 Sensorineural hearing loss, bilateral: Secondary | ICD-10-CM | POA: Diagnosis not present

## 2017-04-19 ENCOUNTER — Encounter: Payer: Self-pay | Admitting: Emergency Medicine

## 2017-04-19 ENCOUNTER — Emergency Department
Admission: EM | Admit: 2017-04-19 | Discharge: 2017-04-19 | Disposition: A | Discharge status: Home / Self Care | Payer: PPO | Attending: Emergency Medicine | Admitting: Emergency Medicine

## 2017-04-19 ENCOUNTER — Emergency Department: Payer: PPO

## 2017-04-19 DIAGNOSIS — I4891 Unspecified atrial fibrillation: Secondary | ICD-10-CM | POA: Insufficient documentation

## 2017-04-19 DIAGNOSIS — E039 Hypothyroidism, unspecified: Secondary | ICD-10-CM | POA: Insufficient documentation

## 2017-04-19 DIAGNOSIS — M5481 Occipital neuralgia: Secondary | ICD-10-CM

## 2017-04-19 DIAGNOSIS — Z79899 Other long term (current) drug therapy: Secondary | ICD-10-CM | POA: Diagnosis not present

## 2017-04-19 DIAGNOSIS — R51 Headache: Secondary | ICD-10-CM | POA: Diagnosis not present

## 2017-04-19 DIAGNOSIS — Z9621 Cochlear implant status: Secondary | ICD-10-CM | POA: Diagnosis not present

## 2017-04-19 DIAGNOSIS — M542 Cervicalgia: Secondary | ICD-10-CM | POA: Diagnosis not present

## 2017-04-19 MED ORDER — GABAPENTIN 100 MG PO CAPS: 100.0000 mg | ORAL_CAPSULE | Freq: Three times a day (TID) | ORAL | 0 refills | Status: DC | PRN

## 2017-04-19 MED ORDER — GABAPENTIN 100 MG PO CAPS
100.0000 mg | ORAL_CAPSULE | Freq: Once | ORAL | Status: AC
  Administered 2017-04-19: 100 mg via ORAL
  Filled 2017-04-19 (×2): qty 1

## 2017-04-19 NOTE — ED Notes (Signed)
Informed RN that patient has been roomed and is ready for evaluation.  Patient in NAD at this time and call bell placed within reach.   

## 2017-04-19 NOTE — ED Provider Notes (Signed)
[facility] Emergency Department Provider Note  ____________________________________________   First MD Initiated Contact with Patient 04/19/17 1659     (approximate)  I have reviewed the triage vital signs and the nursing notes.   HISTORY  Chief Complaint Headache   HPI JONIAH BEDNARSKI is a 77 y.o. female with a history of atrial fibrillation who is presenting for left-sided neck pain and headache over the past 2 months.  She says the pain is a 5 out of 10 and starts in her left neck and then radiates up the back of her head and around to her temple.  She says the pain is a 5 out of 10 at this time.  Says that she has been nauseous but without vomiting.  The daughter is concerned because of intermittent slurred speech and right facial drooping as well.  None of these episodes today but they have been ongoing over the past several months.  The patient says that she has had a CAT scan which came back normal.  She also has cochlear implants and says the cochlear implants have been malfunctioning, with the sound intermittently cutting out.  Denies any weakness to the bilateral upper or lower extremities.  Says the pain sometimes feels like tingling and electric shocks.  Past Medical History:  Diagnosis Date  . Anxiety   . Atrial fibrillation ([place])   . Breast cancer ([place]) 1999   RT MASTECTOMY  . DDD (degenerative disc disease), cervical   . Depression   . Diverticulosis   . Diverticulosis   . DVT of lower extremity (deep venous thrombosis) ([place])   . GERD (gastroesophageal reflux disease)   . Goiter   . History of chemotherapy 2000   BREAST CA  . OSA (obstructive sleep apnea)   . Osteoporosis   . Personal history of chemotherapy 1999   BREAST CA  . Status post chemotherapy    2000 right breast cancer  . Thyroid disease   . UTI (urinary tract infection)   . Vertigo     Patient Active Problem List   Diagnosis Date Noted  . Personal history of malignant  neoplasm of breast 09/08/2015  . Adult hypothyroidism 04/06/2015  . Blood glucose elevated 09/07/2014  . Deafness, sensorineural 07/20/2014  . Clinical depression 06/22/2014  . Acid reflux 06/22/2014  . BP (high blood pressure) 06/22/2014  . Malignant neoplasm of breast ([place]) 06/22/2014  . Frequent UTI 06/22/2014  . Apnea, sleep 06/22/2014  . Head revolving around 06/22/2014  . Dizziness 04/16/2014  . Degeneration of intervertebral disc of lumbar region 10/06/2013  . Neuritis or radiculitis due to rupture of lumbar intervertebral disc 10/06/2013  . Arthritis of knee, degenerative 10/06/2013  . Cystocele, midline 11/16/2011  . Female genuine stress incontinence 11/16/2011  . Incomplete bladder emptying 11/16/2011  . 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 Excessive urination at night 11/16/2011  . Neuralgia neuritis, sciatic nerve 11/16/2011  . Urge incontinence of urine 11/16/2011    Past Surgical History:  Procedure Laterality Date  . ABDOMINAL HYSTERECTOMY    . BREAST BIOPSY Left 2006   negative  . CHOLECYSTECTOMY    . COCHLEAR IMPLANT Right   . COLONOSCOPY WITH PROPOFOL N/A 07/24/2016   Procedure: COLONOSCOPY WITH PROPOFOL;  Surgeon: Manya Silvas, MD;  Location[facility] ENDOSCOPY;  Service: Endoscopy;  Laterality: N/A;  . ESOPHAGOGASTRODUODENOSCOPY (EGD) WITH PROPOFOL N/A 04/12/2016   Procedure: ESOPHAGOGASTRODUODENOSCOPY (EGD) WITH PROPOFOL;  Surgeon: Manya Silvas, MD;  Location: [facility] ENDOSCOPY;  Service:  Endoscopy;  Laterality: N/A;  . EYE SURGERY    . FOOT SURGERY Bilateral   . HAND SURGERY Right   . MASTECTOMY Right 1999   BREAST CA  . PARTIAL COLECTOMY    . THYROIDECTOMY      Prior to Admission medications   Medication Sig Start Date End Date Taking? Authorizing Provider  atorvastatin (LIPITOR) 40 MG tablet Take 40 mg by mouth at bedtime. 09/07/14   [provider]  azelastine (ASTELIN) 0.1 % nasal spray PLACE 1 SPRAY INTO BOTH NOSTRILS 2 (TWO)  TIMES DAILY FOR 7 DAYS. THEN TWICE A DAY AS NEEDED 09/01/15   [provider]  cholecalciferol (VITAMIN D) 1000 units tablet Take 1,000 Units by mouth daily.    [provider]  cyanocobalamin (V-R VITAMIN B-12) 500 MCG tablet Take 500 mg by mouth daily.    [provider]  DULoxetine (CYMBALTA) 60 MG capsule Take 1 capsule (60 mg total) by mouth every morning. 04/09/17   Rainey Pines, MD  pantoprazole (PROTONIX) 40 MG tablet Take 40 mg by mouth daily. 03/04/15   [provider]  SYNTHROID 88 MCG tablet Take 88 mcg by mouth daily. 03/04/15   [provider]  traZODone (DESYREL) 100 MG tablet Take 1 tablet (100 mg total) by mouth at bedtime as needed for sleep. 04/09/17   Rainey Pines, MD    Allergies Imipramine; Latex; Penicillins; and Tape  Family History  Problem Relation Age of Onset  . Dementia Mother   . Dementia Sister   . Diabetes Sister   . Heart attack Brother   . COPD Brother   . Colon cancer Brother   . Liver cancer Brother   . COPD Brother   . Breast cancer Neg Hx     Social History Social History   Tobacco Use  . Smoking status: Never Smoker  . Smokeless tobacco: Never Used  Substance Use Topics  . Alcohol use: No    Alcohol/week: 0.0 oz  . Drug use: No    Review of Systems  Constitutional: No fever/chills Eyes: No visual changes. ENT: No sore throat. Cardiovascular: Denies chest pain. Respiratory: Denies shortness of breath. Gastrointestinal: No abdominal pain.  No nausea, no vomiting.  No diarrhea.  No constipation. Genitourinary: Negative for dysuria. Musculoskeletal: Negative for back pain. Skin: Negative for rash. Neurological: as above   ____________________________________________   PHYSICAL EXAM:  VITAL SIGNS: ED Triage Vitals  Enc Vitals Group     BP 04/19/17 1557 114/66     Pulse Rate 04/19/17 1557 82     Resp 04/19/17 1557 18     Temp 04/19/17 1557 98.6 F (37 C)     Temp Source 04/19/17  1557 Oral     SpO2 04/19/17 1557 100 %     Weight 04/19/17 1557 148 lb (67.1 kg)     Height 04/19/17 1557 5\' 6"  (1.676 m)     Head Circumference --      Peak Flow --      Pain Score 04/19/17 1611 5     Pain Loc --      Pain Edu? --      Excl. in [place]? --     Constitutional: Alert and oriented. Well appearing and in no acute distress. Eyes: Conjunctivae are normal.  Head: Atraumatic.  No tenderness to palpation or nodularity along the distribution of the temporal arteries. Nose: No congestion/rhinnorhea.  Cochlear implants in place and the sites of implantation without any erythema, swelling, pus  or tenderness. Mouth/Throat: Mucous membranes are moist.  Neck: No stridor.   Mild tenderness to palpation along the distribution of the left-sided trapezius muscle especially as it gets closer to the base of the skull. Cardiovascular: Normal rate, regular rhythm. Grossly normal heart sounds.   Respiratory: Normal respiratory effort.  No retractions. Lungs CTAB. Gastrointestinal: Soft and nontender. No distention.  Musculoskeletal: No lower extremity tenderness nor edema.  No joint effusions. Neurologic:  Normal speech and language. No gross focal neurologic deficits are appreciated. Skin:  Skin is warm, dry and intact. No rash noted. Psychiatric: Mood and affect are normal. Speech and behavior are normal.  ____________________________________________   LABS (all labs ordered are listed, but only abnormal results are displayed)  Labs Reviewed - No data to display ____________________________________________  EKG   ____________________________________________  RADIOLOGY  CT scan of the brain without intracranial pathology. ____________________________________________   PROCEDURES  Procedure(s) performed:   Procedures  Critical Care performed:   ____________________________________________   INITIAL IMPRESSION / ASSESSMENT AND PLAN / ED COURSE  Pertinent labs & imaging  results that were available during my care of the patient were reviewed by me and considered in my medical decision making (see chart for details).  Differential diagnosis includes, but is not limited to, intracranial hemorrhage, meningitis/encephalitis, previous head trauma, cavernous venous thrombosis, tension headache, temporal arteritis, migraine or migraine equivalent, idiopathic intracranial hypertension, and non-specific headache. As part of my medical decision making, I reviewed the following data within the electronic MEDICAL RECORD NUMBER Notes from prior ED visits  ----------------------------------------- 7:47 PM on 04/19/2017 -----------------------------------------  Patient says that she is feeling better with Neurontin.  No acute finding on the CAT scan.  Will DC home with Neurontin as needed.  The patient will follow up with neurology as planned.  She is understanding of the treatment plan willing to comply.  Pain is possible occipital neuralgia because of the location as well as the character.     ____________________________________________   FINAL CLINICAL IMPRESSION(S) / ED DIAGNOSES  Occipital neuralgia.    NEW MEDICATIONS STARTED DURING THIS VISIT:  New Prescriptions   No medications on file     Note:  This document was prepared using Dragon voice recognition software and may include unintentional dictation errors.     Orbie Pyo, MD 04/19/17 [phone]

## 2017-04-19 NOTE — ED Triage Notes (Signed)
Pt arrived with family with complaints of headache. Pt states the pain starts in the left side of her neck and radiates up her head. Pt states the pain is persistent and has been present since December. Pt followed up with her primary doctor who thought pain was from a pulled muscle after a clear CT and prescribed her muscle relaxants in January. Pt has had no relief from prescriptions and was given referral to a neurorlogist. However, pt states she cant "get in to see neurlolgist till 2/28."  Pt called primary doctor today to get stronger pain medication and doctor told her to come to ED to be evaluated.

## 2017-05-10 DIAGNOSIS — M5481 Occipital neuralgia: Secondary | ICD-10-CM | POA: Diagnosis not present

## 2017-05-15 DIAGNOSIS — M5481 Occipital neuralgia: Secondary | ICD-10-CM | POA: Diagnosis not present

## 2017-05-24 DIAGNOSIS — R2689 Other abnormalities of gait and mobility: Secondary | ICD-10-CM | POA: Diagnosis not present

## 2017-05-24 DIAGNOSIS — R413 Other amnesia: Secondary | ICD-10-CM | POA: Diagnosis not present

## 2017-05-24 DIAGNOSIS — R42 Dizziness and giddiness: Secondary | ICD-10-CM | POA: Diagnosis not present

## 2017-05-24 DIAGNOSIS — M5481 Occipital neuralgia: Secondary | ICD-10-CM | POA: Diagnosis not present

## 2017-05-25 ENCOUNTER — Other Ambulatory Visit: Payer: Self-pay | Admitting: Nurse Practitioner

## 2017-05-25 DIAGNOSIS — M5481 Occipital neuralgia: Secondary | ICD-10-CM

## 2017-05-25 DIAGNOSIS — R42 Dizziness and giddiness: Secondary | ICD-10-CM

## 2017-05-25 DIAGNOSIS — R2689 Other abnormalities of gait and mobility: Secondary | ICD-10-CM

## 2017-05-31 ENCOUNTER — Ambulatory Visit
Admission: RE | Admit: 2017-05-31 | Discharge: 2017-05-31 | Disposition: A | Discharge status: Home / Self Care | Payer: PPO | Source: Ambulatory Visit | Attending: Nurse Practitioner | Admitting: Nurse Practitioner

## 2017-05-31 DIAGNOSIS — M47892 Other spondylosis, cervical region: Secondary | ICD-10-CM | POA: Diagnosis not present

## 2017-05-31 DIAGNOSIS — M5481 Occipital neuralgia: Secondary | ICD-10-CM | POA: Insufficient documentation

## 2017-05-31 DIAGNOSIS — R2689 Other abnormalities of gait and mobility: Secondary | ICD-10-CM | POA: Diagnosis not present

## 2017-05-31 DIAGNOSIS — M47812 Spondylosis without myelopathy or radiculopathy, cervical region: Secondary | ICD-10-CM | POA: Diagnosis not present

## 2017-05-31 DIAGNOSIS — R42 Dizziness and giddiness: Secondary | ICD-10-CM | POA: Diagnosis not present

## 2017-05-31 DIAGNOSIS — M542 Cervicalgia: Secondary | ICD-10-CM | POA: Insufficient documentation

## 2017-06-04 ENCOUNTER — Ambulatory Visit: Payer: PPO | Admitting: Psychiatry

## 2017-06-07 ENCOUNTER — Emergency Department
Admission: EM | Admit: 2017-06-07 | Discharge: 2017-06-07 | Disposition: A | Discharge status: Home / Self Care | Payer: PPO | Attending: Emergency Medicine | Admitting: Emergency Medicine

## 2017-06-07 ENCOUNTER — Other Ambulatory Visit: Payer: Self-pay

## 2017-06-07 ENCOUNTER — Emergency Department: Payer: PPO

## 2017-06-07 DIAGNOSIS — M6281 Muscle weakness (generalized): Secondary | ICD-10-CM | POA: Diagnosis not present

## 2017-06-07 DIAGNOSIS — Z91040 Latex allergy status: Secondary | ICD-10-CM | POA: Insufficient documentation

## 2017-06-07 DIAGNOSIS — E039 Hypothyroidism, unspecified: Secondary | ICD-10-CM | POA: Insufficient documentation

## 2017-06-07 DIAGNOSIS — Z853 Personal history of malignant neoplasm of breast: Secondary | ICD-10-CM | POA: Insufficient documentation

## 2017-06-07 DIAGNOSIS — Z7982 Long term (current) use of aspirin: Secondary | ICD-10-CM | POA: Diagnosis not present

## 2017-06-07 DIAGNOSIS — Z79899 Other long term (current) drug therapy: Secondary | ICD-10-CM | POA: Diagnosis not present

## 2017-06-07 DIAGNOSIS — R531 Weakness: Secondary | ICD-10-CM | POA: Insufficient documentation

## 2017-06-07 LAB — COMPREHENSIVE METABOLIC PANEL
ALT: 16 U/L (ref 14–54)
ANION GAP: 9 (ref 5–15)
AST: 23 U/L (ref 15–41)
Albumin: 3.8 g/dL (ref 3.5–5.0)
Alkaline Phosphatase: 53 U/L (ref 38–126)
BUN: 14 mg/dL (ref 6–20)
CO2: 28 mmol/L (ref 22–32)
Calcium: 8.7 mg/dL — ABNORMAL LOW (ref 8.9–10.3)
Chloride: 102 mmol/L (ref 101–111)
Creatinine, Ser: 0.77 mg/dL (ref 0.44–1.00)
Glucose, Bld: 92 mg/dL (ref 65–99)
POTASSIUM: 3.8 mmol/L (ref 3.5–5.1)
Sodium: 139 mmol/L (ref 135–145)
Total Bilirubin: 0.8 mg/dL (ref 0.3–1.2)
Total Protein: 7.2 g/dL (ref 6.5–8.1)

## 2017-06-07 LAB — URINALYSIS, COMPLETE (UACMP) WITH MICROSCOPIC
BILIRUBIN URINE: NEGATIVE
Bacteria, UA: NONE SEEN
GLUCOSE, UA: NEGATIVE mg/dL
Hgb urine dipstick: NEGATIVE
KETONES UR: NEGATIVE mg/dL
Nitrite: NEGATIVE
PH: 7 (ref 5.0–8.0)
Protein, ur: NEGATIVE mg/dL
SPECIFIC GRAVITY, URINE: 1.003 — AB (ref 1.005–1.030)

## 2017-06-07 LAB — CBC
HCT: 38.9 % (ref 35.0–47.0)
Hemoglobin: 13.1 g/dL (ref 12.0–16.0)
MCH: 29.5 pg (ref 26.0–34.0)
MCHC: 33.7 g/dL (ref 32.0–36.0)
MCV: 87.6 fL (ref 80.0–100.0)
PLATELETS: 211 10*3/uL (ref 150–440)
RBC: 4.44 MIL/uL (ref 3.80–5.20)
RDW: 13.8 % (ref 11.5–14.5)
WBC: 7.3 10*3/uL (ref 3.6–11.0)

## 2017-06-07 LAB — TROPONIN I

## 2017-06-07 MED ORDER — TRAMADOL HCL 50 MG PO TABS: 50.0000 mg | ORAL_TABLET | Freq: Four times a day (QID) | ORAL | 0 refills | Status: DC | PRN

## 2017-06-07 NOTE — ED Provider Notes (Signed)
[facility] Emergency Department Provider Note   ____________________________________________    I have reviewed the triage vital signs and the nursing notes.   HISTORY  Chief Complaint Weakness     HPI Breanna Zhang is a 77 y.o. female who presents with complaints of weakness.  Patient reports over the last several months she has had gradually worsening weakness in her legs bilaterally.  When she gets up in the morning if she does not use her cane she feels as though her legs will give out on her.  She denies trauma.  Denies fevers chills.  No abdominal pain.  No dysuria although does report foul-smelling urine.  No cough or shortness of breath.  No rash.  Very gradual onset of leg weakness bilaterally.   Past Medical History:  Diagnosis Date  . Anxiety   . Atrial fibrillation ([place])   . Breast cancer ([place]) 1999   RT MASTECTOMY  . DDD (degenerative disc disease), cervical   . Depression   . Diverticulosis   . Diverticulosis   . DVT of lower extremity (deep venous thrombosis) ([place])   . GERD (gastroesophageal reflux disease)   . Goiter   . History of chemotherapy 2000   BREAST CA  . OSA (obstructive sleep apnea)   . Osteoporosis   . Personal history of chemotherapy 1999   BREAST CA  . Status post chemotherapy    2000 right breast cancer  . Thyroid disease   . UTI (urinary tract infection)   . Vertigo     Patient Active Problem List   Diagnosis Date Noted  . Personal history of malignant neoplasm of breast 09/08/2015  . Adult hypothyroidism 04/06/2015  . Blood glucose elevated 09/07/2014  . Deafness, sensorineural 07/20/2014  . Clinical depression 06/22/2014  . Acid reflux 06/22/2014  . BP (high blood pressure) 06/22/2014  . Malignant neoplasm of breast ([place]) 06/22/2014  . Frequent UTI 06/22/2014  . Apnea, sleep 06/22/2014  . Head revolving around 06/22/2014  . Dizziness 04/16/2014  . Degeneration of intervertebral disc of lumbar  region 10/06/2013  . Neuritis or radiculitis due to rupture of lumbar intervertebral disc 10/06/2013  . Arthritis of knee, degenerative 10/06/2013  . Cystocele, midline 11/16/2011  . Female genuine stress incontinence 11/16/2011  . Incomplete bladder emptying 11/16/2011  . 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 Excessive urination at night 11/16/2011  . Neuralgia neuritis, sciatic nerve 11/16/2011  . Urge incontinence of urine 11/16/2011    Past Surgical History:  Procedure Laterality Date  . ABDOMINAL HYSTERECTOMY    . BREAST BIOPSY Left 2006   negative  . CHOLECYSTECTOMY    . COCHLEAR IMPLANT Right   . COLONOSCOPY WITH PROPOFOL N/A 07/24/2016   Procedure: COLONOSCOPY WITH PROPOFOL;  Surgeon: Manya Silvas, MD;  Location: [facility] ENDOSCOPY;  Service: Endoscopy;  Laterality: N/A;  . ESOPHAGOGASTRODUODENOSCOPY (EGD) WITH PROPOFOL N/A 04/12/2016   Procedure: ESOPHAGOGASTRODUODENOSCOPY (EGD) WITH PROPOFOL;  Surgeon: Manya Silvas, MD;  Location: [facility] ENDOSCOPY;  Service: Endoscopy;  Laterality: N/A;  . EYE SURGERY    . FOOT SURGERY Bilateral   . HAND SURGERY Right   . MASTECTOMY Right 1999   BREAST CA  . PARTIAL COLECTOMY    . THYROIDECTOMY      Prior to Admission medications   Medication Sig Start Date End Date Taking? Authorizing Provider  aspirin EC 81 MG tablet Take 81 mg by mouth daily.   Yes [provider]  atorvastatin (LIPITOR) 40 MG tablet  Take 40 mg by mouth at bedtime. 09/07/14  Yes [provider]  cholecalciferol (VITAMIN D) 1000 units tablet Take 1,000 Units by mouth daily.   Yes [provider]  cyanocobalamin (V-R VITAMIN B-12) 500 MCG tablet Take 500 mg by mouth daily.   Yes [provider]  dicyclomine (BENTYL) 10 MG capsule Take 10 mg by mouth 4 (four) times daily as needed for spasms.   Yes [provider]  DULoxetine (CYMBALTA) 60 MG capsule Take 1 capsule (60 mg total) by mouth every morning. 04/09/17  Yes  Rainey Pines, MD  hydrochlorothiazide (HYDRODIURIL) 12.5 MG tablet Take 12.5 mg by mouth daily.   Yes [provider]  pantoprazole (PROTONIX) 40 MG tablet Take 40 mg by mouth 2 (two) times daily.    Yes [provider]  SYNTHROID 88 MCG tablet Take 88 mcg by mouth daily. 03/04/15  Yes [provider]  traZODone (DESYREL) 100 MG tablet Take 1 tablet (100 mg total) by mouth at bedtime as needed for sleep. 04/09/17  Yes Rainey Pines, MD  gabapentin (NEURONTIN) 100 MG capsule Take 1 capsule (100 mg total) by mouth 3 (three) times daily as needed (pain). Patient not taking: Reported on 06/07/2017 04/19/17 04/19/18  Schaevitz, Randall An, MD  traMADol (ULTRAM) 50 MG tablet Take 1 tablet (50 mg total) by mouth every 6 (six) hours as needed. 06/07/17 06/07/18  Lavonia Drafts, MD     Allergies Imipramine; Latex; Penicillins; and Tape  Family History  Problem Relation Age of Onset  . Dementia Mother   . Dementia Sister   . Diabetes Sister   . Heart attack Brother   . COPD Brother   . Colon cancer Brother   . Liver cancer Brother   . COPD Brother   . Breast cancer Neg Hx     Social History Social History   Tobacco Use  . Smoking status: Never Smoker  . Smokeless tobacco: Never Used  Substance Use Topics  . Alcohol use: No    Alcohol/week: 0.0 oz  . Drug use: No    Review of Systems  Constitutional: No fever/chills Eyes: No visual changes.  ENT: Chronic neck pain Cardiovascular: Denies chest pain. Respiratory: Denies shortness of breath. Gastrointestinal: No abdominal pain.  No nausea, no vomiting.   Genitourinary: Negative for dysuria.  Foul-smelling urine Musculoskeletal: Negative for back pain.  No joint swelling Skin: Negative for rash. Neurological: Negative for headaches or focal weakness   ____________________________________________   PHYSICAL EXAM:  VITAL SIGNS: ED Triage Vitals  Enc Vitals Group     BP 06/07/17 0834 (!) 144/73      Pulse Rate 06/07/17 0834 72     Resp 06/07/17 0834 16     Temp 06/07/17 0834 99.4 F (37.4 C)     Temp Source 06/07/17 0834 Oral     SpO2 06/07/17 0834 100 %     Weight 06/07/17 0834 65.8 kg (145 lb)     Height 06/07/17 0834 1.676 m (5\' 6" )     Head Circumference --      Peak Flow --      Pain Score 06/07/17 0902 6     Pain Loc --      Pain Edu? --      Excl. in [place]? --     Constitutional: Alert and oriented. No acute distress. Pleasant and interactive Eyes: Conjunctivae are normal.   Nose: No congestion/rhinnorhea. Mouth/Throat: Mucous membranes are moist.   Neck:  Painless ROM Cardiovascular:  Normal rate, regular rhythm . Good peripheral circulation. Respiratory: Normal respiratory effort.  No retractions. Lungs CTAB. Gastrointestinal: Soft and nontender. No distention.   Genitourinary: deferred Musculoskeletal: No lower extremity tenderness nor edema.  Warm and well perfused, able to lift both legs off of the bed individually for 10 seconds Neurologic:  Normal speech and language. No gross focal neurologic deficits are appreciated.  Cranial nerves II through XII appear normal Skin:  Skin is warm, dry and intact. No rash noted. Psychiatric: Mood and affect are normal. Speech and behavior are normal.  ____________________________________________   LABS (all labs ordered are listed, but only abnormal results are displayed)  Labs Reviewed  COMPREHENSIVE METABOLIC PANEL - Abnormal; Notable for the following components:      Result Value   Calcium 8.7 (*)    All other components within normal limits  URINALYSIS, COMPLETE (UACMP) WITH MICROSCOPIC - Abnormal; Notable for the following components:   Color, Urine STRAW (*)    APPearance CLEAR (*)    Specific Gravity, Urine 1.003 (*)    Leukocytes, UA TRACE (*)    Squamous Epithelial / LPF 0-5 (*)    All other components within normal limits  CBC  TROPONIN I   ____________________________________________  EKG  ED ECG  REPORT I, Lavonia Drafts, the attending physician, personally viewed and interpreted this ECG.  Date: 06/07/2017 EKG Time: 9:11 AM Rate: 69 Rhythm: Atrial paced rhythm QRS Axis: normal Intervals: normal ST/T Wave abnormalities: normal Narrative Interpretation: no evidence of acute ischemia  ____________________________________________  RADIOLOGY  Chest x-ray unremarkable ____________________________________________   PROCEDURES  Procedure(s) performed: No  Procedures   Critical Care performed: No ____________________________________________   INITIAL IMPRESSION / ASSESSMENT AND PLAN / ED COURSE  Pertinent labs & imaging results that were available during my care of the patient were reviewed by me and considered in my medical decision making (see chart for details).  Patient presents with weakness, primarily manifested by difficulty ambulating.  This appears to have been gradually worsening over many months.  We will check labs, chest x-ray, urine to evaluate for infection or electrolyte  Abnormalities  Patient's workup is unremarkable, had long discussion with patient and daughter it appears that stress and possibly depression has had an impact on the patient recently.  Apparently she has had difficulty walking in the past related to depression and stress.  This may be a somatic response to death of family member recently as well as a friend going into hospice  Patient is reassured that no lab abnormalities, recommended continued outpatient workup with close follow-up with psychiatry    ____________________________________________   FINAL CLINICAL IMPRESSION(S) / ED DIAGNOSES  Final diagnoses:  Generalized weakness        Note:  This document was prepared using Dragon voice recognition software and may include unintentional dictation errors.    Lavonia Drafts, MD 06/07/17 1309

## 2017-06-07 NOTE — ED Triage Notes (Signed)
Pt c/o increased weakness over the past week. States she is getting so weak she is scared she is going to fall.

## 2017-06-11 DIAGNOSIS — M4302 Spondylolysis, cervical region: Secondary | ICD-10-CM | POA: Diagnosis not present

## 2017-06-11 DIAGNOSIS — M5030 Other cervical disc degeneration, unspecified cervical region: Secondary | ICD-10-CM | POA: Diagnosis not present

## 2017-06-11 DIAGNOSIS — M5412 Radiculopathy, cervical region: Secondary | ICD-10-CM | POA: Diagnosis not present

## 2017-06-14 DIAGNOSIS — M5416 Radiculopathy, lumbar region: Secondary | ICD-10-CM | POA: Diagnosis not present

## 2017-06-14 DIAGNOSIS — Z853 Personal history of malignant neoplasm of breast: Secondary | ICD-10-CM | POA: Diagnosis not present

## 2017-06-14 DIAGNOSIS — R739 Hyperglycemia, unspecified: Secondary | ICD-10-CM | POA: Diagnosis not present

## 2017-06-14 DIAGNOSIS — R29898 Other symptoms and signs involving the musculoskeletal system: Secondary | ICD-10-CM | POA: Diagnosis not present

## 2017-06-14 DIAGNOSIS — N390 Urinary tract infection, site not specified: Secondary | ICD-10-CM | POA: Diagnosis not present

## 2017-06-14 DIAGNOSIS — K219 Gastro-esophageal reflux disease without esophagitis: Secondary | ICD-10-CM | POA: Diagnosis not present

## 2017-06-14 DIAGNOSIS — E034 Atrophy of thyroid (acquired): Secondary | ICD-10-CM | POA: Diagnosis not present

## 2017-06-14 DIAGNOSIS — G4733 Obstructive sleep apnea (adult) (pediatric): Secondary | ICD-10-CM | POA: Diagnosis not present

## 2017-06-14 DIAGNOSIS — I10 Essential (primary) hypertension: Secondary | ICD-10-CM | POA: Diagnosis not present

## 2017-06-14 DIAGNOSIS — M5136 Other intervertebral disc degeneration, lumbar region: Secondary | ICD-10-CM | POA: Diagnosis not present

## 2017-06-14 DIAGNOSIS — F3342 Major depressive disorder, recurrent, in full remission: Secondary | ICD-10-CM | POA: Diagnosis not present

## 2017-06-18 ENCOUNTER — Ambulatory Visit: Payer: PPO | Admitting: Psychiatry

## 2017-06-18 ENCOUNTER — Encounter: Payer: Self-pay | Admitting: Psychiatry

## 2017-06-18 DIAGNOSIS — F5105 Insomnia due to other mental disorder: Secondary | ICD-10-CM | POA: Diagnosis not present

## 2017-06-18 DIAGNOSIS — F4310 Post-traumatic stress disorder, unspecified: Secondary | ICD-10-CM | POA: Diagnosis not present

## 2017-06-18 DIAGNOSIS — F331 Major depressive disorder, recurrent, moderate: Secondary | ICD-10-CM | POA: Diagnosis not present

## 2017-06-18 MED ORDER — BUSPIRONE HCL 10 MG PO TABS: 10.0000 mg | ORAL_TABLET | Freq: Every day | ORAL | 2 refills | Status: DC

## 2017-06-18 MED ORDER — TRAZODONE HCL 100 MG PO TABS: 100.0000 mg | ORAL_TABLET | Freq: Every evening | ORAL | 1 refills | Status: DC | PRN

## 2017-06-18 MED ORDER — DULOXETINE HCL 60 MG PO CPEP: 60.0000 mg | ORAL_CAPSULE | Freq: Every morning | ORAL | 1 refills | Status: DC

## 2017-06-18 NOTE — Progress Notes (Signed)
Psychiatric Follow Up MD  Note  Patient Identification: Breanna Zhang MRN:  [id number] Date of Evaluation:  06/18/2017 Referral Source: Dr Bridgett Larsson  Chief Complaint:    Visit Diagnosis:    ICD-10-CM   1. MDD (major depressive disorder), recurrent episode, moderate (HCC) F33.1   2. Post traumatic stress disorder (PTSD) F43.10    Diagnosis:   Patient Active Problem List   Diagnosis Date Noted  . Personal history of malignant neoplasm of breast [Z85.3] 09/08/2015  . Adult hypothyroidism [E03.9] 04/06/2015  . Blood glucose elevated [R73.9] 09/07/2014  . 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 Clinical depression [F32.9] 06/22/2014  . Acid reflux [K21.9] 06/22/2014  . BP (high blood pressure) [I10] 06/22/2014  . Malignant neoplasm of breast ([place]) [C50.919] 06/22/2014  . Frequent UTI [N39.0] 06/22/2014  . Apnea, sleep [G47.30] 06/22/2014  . Head revolving around [R42] 06/22/2014  . Dizziness [R42] 04/16/2014  . 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 Neuritis or radiculitis due to rupture of lumbar intervertebral disc [M51.16] 10/06/2013  . 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 Cystocele, midline [N81.11] 11/16/2011  . Female genuine stress incontinence [N39.3] 11/16/2011  . Incomplete bladder emptying [R33.9] 11/16/2011  . 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 Excessive urination at night [R35.1] 11/16/2011  . Neuralgia neuritis, sciatic nerve [M54.30] 11/16/2011  . Urge incontinence of urine [N39.41] 11/16/2011   History of Present Illness:  Patient is a 77 year old married female who presented for follow-up accompanied by her daughter.  She was walking with a cane and had difficulty in walking.  Her daughter reported that she was having headaches and was seen by the pain management who was giving injections in her back.  Patient was also seen in the emergency room who cleared her medically.  Patient has been  having arthritis in her back  Patient reported that she was diagnosed with arthritis in her spine as well as numbness in her left leg.  She was sad and tearful during the interview.  Her daughter reported that she has been staying in the house and is becoming more depressed.  Her family is supporting her.  She has been taking her medications.  She is running out of the tramadol which was given to her at the emergency room.  They will call her primary care physician at all.    Patient reported that she feels that she has difficulty in walking and has to use cane.  We discussed about her medications.  She wants to take BuSpar to help with her anxiety at this time.  She appeared apprehensive.  She currently denied having any suicidal or homicidal ideations or plans.         Elements:  Severity:  mild. Associated Signs/Symptoms: Depression Symptoms:  loss of energy/fatigue, (Hypo) Manic Symptoms:  none Anxiety Symptoms:  Excessive Worry, Psychotic Symptoms:  none PTSD Symptoms: Negative NA  Past Medical History:  Past Medical History:  Diagnosis Date  . Anxiety   . Atrial fibrillation ([place])   . Breast cancer ([place]) 1999   RT MASTECTOMY  . DDD (degenerative disc disease), cervical   . Depression   . Diverticulosis   . Diverticulosis   . DVT of lower extremity (deep venous thrombosis) ([place])   . GERD (gastroesophageal reflux disease)   . Goiter   . History of chemotherapy 2000   BREAST CA  . OSA (obstructive sleep apnea)   . Osteoporosis   . Personal history of chemotherapy 1999  BREAST CA  . Status post chemotherapy    2000 right breast cancer  . Thyroid disease   . UTI (urinary tract infection)   . Vertigo     Past Surgical History:  Procedure Laterality Date  . ABDOMINAL HYSTERECTOMY    . BREAST BIOPSY Left 2006   negative  . CHOLECYSTECTOMY    . COCHLEAR IMPLANT Right   . COLONOSCOPY WITH PROPOFOL N/A 07/24/2016   Procedure: COLONOSCOPY WITH PROPOFOL;  Surgeon:  Manya Silvas, MD;  Location[facility] ENDOSCOPY;  Service: Endoscopy;  Laterality: N/A;  . ESOPHAGOGASTRODUODENOSCOPY (EGD) WITH PROPOFOL N/A 04/12/2016   Procedure: ESOPHAGOGASTRODUODENOSCOPY (EGD) WITH PROPOFOL;  Surgeon: Manya Silvas, MD;  Location: [facility] ENDOSCOPY;  Service: Endoscopy;  Laterality: N/A;  . EYE SURGERY    . FOOT SURGERY Bilateral   . HAND SURGERY Right   . MASTECTOMY Right 1999   BREAST CA  . PARTIAL COLECTOMY    . THYROIDECTOMY     Family History:  Family History  Problem Relation Age of Onset  . Dementia Mother   . Dementia Sister   . Diabetes Sister   . Heart attack Brother   . COPD Brother   . Colon cancer Brother   . Liver cancer Brother   . COPD Brother   . Breast cancer Neg Hx    Social History:   Social History   Socioeconomic History  . Marital status: Married    Spouse name: Not on file  . Number of children: Not on file  . Years of education: Not on file  . Highest education level: Not on file  Occupational History  . Not on file  Social Needs  . Financial resource strain: Not on file  . Food insecurity:    Worry: Not on file    Inability: Not on file  . Transportation needs:    Medical: Not on file    Non-medical: Not on file  Tobacco Use  . Smoking status: Never Smoker  . Smokeless tobacco: Never Used  Substance and Sexual Activity  . Alcohol use: No    Alcohol/week: 0.0 oz  . Drug use: No  . Sexual activity: Never  Lifestyle  . Physical activity:    Days per week: Not on file    Minutes per session: Not on file  . Stress: Not on file  Relationships  . Social connections:    Talks on phone: Not on file    Gets together: Not on file    Attends religious service: Not on file    Active member of club or organization: Not on file    Attends meetings of clubs or organizations: Not on file    Relationship status: Not on file  Other Topics Concern  . Not on file  Social History Narrative  . Not on file   Additional  Social History: Married x 4.  Husband is alcoholic. She is concerned about is behavoir. 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  Has 4 children.   Musculoskeletal: Strength & Muscle Tone: within normal limits Gait & Station: normal Patient leans: N/A  Psychiatric Specialty Exam: Medication Refill     ROS  There were no vitals taken for this visit.There is no height or weight on file to calculate BMI.  General Appearance: Casual  Eye Contact:  Fair  Speech:  Clear and Coherent  Volume:  Normal  Mood:  Anxious  Affect:  Congruent  Thought Process:  Coherent  and Goal Directed  Orientation:  Full (Time, Place, and Person)  Thought Content:  WDL  Suicidal Thoughts:  No  Homicidal Thoughts:  No  Memory:  Immediate;   Fair  Judgement:  Fair  Insight:  Fair  Psychomotor Activity:  Normal  Concentration:  Fair  Recall:  [employer] [place]  Language: Fair  Akathisia:  No  Handed:  Right  AIMS (if indicated):    Assets:  Communication Skills Desire for Improvement Physical Health Social Support Transportation  ADL's:  Intact  Cognition: WNL  Sleep:  7-8   Is the patient at risk to self?  No. Has the patient been a risk to self in the past 6 months?  No. Has the patient been a risk to self within the distant past?  No. Is the patient a risk to others?  No. Has the patient been a risk to others in the past 6 months?  No. Has the patient been a risk to others within the distant past?  No.  Allergies:   Allergies  Allergen Reactions  . Imipramine Tinitus  . Latex Rash  . Penicillins Rash  . Tape Rash   Current Medications: Current Outpatient Medications  Medication Sig Dispense Refill  . aspirin EC 81 MG tablet Take 81 mg by mouth daily.    Marland Kitchen atorvastatin (LIPITOR) 40 MG tablet Take 40 mg by mouth at bedtime.    . cholecalciferol (VITAMIN D) 1000 units tablet Take 1,000 Units by mouth daily.    . cyanocobalamin (V-R VITAMIN  B-12) 500 MCG tablet Take 500 mg by mouth daily.    Marland Kitchen dicyclomine (BENTYL) 10 MG capsule Take 10 mg by mouth 4 (four) times daily as needed for spasms.    . DULoxetine (CYMBALTA) 60 MG capsule Take 1 capsule (60 mg total) by mouth every morning. 90 capsule 1  . gabapentin (NEURONTIN) 100 MG capsule Take 1 capsule (100 mg total) by mouth 3 (three) times daily as needed (pain). (Patient not taking: Reported on 06/07/2017) 30 capsule 0  . hydrochlorothiazide (HYDRODIURIL) 12.5 MG tablet Take 12.5 mg by mouth daily.    . pantoprazole (PROTONIX) 40 MG tablet Take 40 mg by mouth 2 (two) times daily.   11  . SYNTHROID 88 MCG tablet Take 88 mcg by mouth daily.  11  . traMADol (ULTRAM) 50 MG tablet Take 1 tablet (50 mg total) by mouth every 6 (six) hours as needed. 20 tablet 0  . traZODone (DESYREL) 100 MG tablet Take 1 tablet (100 mg total) by mouth at bedtime as needed for sleep. 90 tablet 1   No current facility-administered medications for this visit.     Previous Psychotropic Medications: as reported  Substance Abuse History in the last 12 months:  No.  Consequences of Substance Abuse: Negative NA  Medical Decision Making:  Review of Psycho-Social Stressors (1) and Review and summation of old records (2)  Treatment Plan Summary: Medication management    Continue  Cymbalta 60 mg daily I will start her on BuSpar 10 mg in the morning.   Continue  trazodone 100 mg by mouth daily at bedtime when necessary for insomnia. She will follow-up in 2 months or earlier depending on her symptoms   More than 50% of the time spent in psychoeducation, counseling and coordination of care.    This note was generated in part or whole with voice recognition software. Voice regonition is usually quite accurate but there are transcription errors that can and  very often do occur. I apologize for any typographical errors that were not detected and corrected.   Rainey Pines, MD   4/8/20191:16 PM

## 2017-06-22 DIAGNOSIS — H353221 Exudative age-related macular degeneration, left eye, with active choroidal neovascularization: Secondary | ICD-10-CM | POA: Diagnosis not present

## 2017-06-25 DIAGNOSIS — H353211 Exudative age-related macular degeneration, right eye, with active choroidal neovascularization: Secondary | ICD-10-CM | POA: Diagnosis not present

## 2017-07-03 DIAGNOSIS — M5136 Other intervertebral disc degeneration, lumbar region: Secondary | ICD-10-CM | POA: Diagnosis not present

## 2017-07-04 ENCOUNTER — Ambulatory Visit
Admission: RE | Admit: 2017-07-04 | Discharge: 2017-07-04 | Disposition: A | Discharge status: Home / Self Care | Payer: PPO | Source: Ambulatory Visit | Attending: Physician Assistant | Admitting: Physician Assistant

## 2017-07-04 ENCOUNTER — Other Ambulatory Visit: Payer: Self-pay | Admitting: Physician Assistant

## 2017-07-04 DIAGNOSIS — M5090 Cervical disc disorder, unspecified, unspecified cervical region: Secondary | ICD-10-CM | POA: Diagnosis not present

## 2017-07-04 DIAGNOSIS — R51 Headache: Secondary | ICD-10-CM | POA: Diagnosis not present

## 2017-07-04 DIAGNOSIS — M5416 Radiculopathy, lumbar region: Secondary | ICD-10-CM | POA: Diagnosis not present

## 2017-07-04 DIAGNOSIS — M79605 Pain in left leg: Secondary | ICD-10-CM

## 2017-07-05 DIAGNOSIS — H903 Sensorineural hearing loss, bilateral: Secondary | ICD-10-CM | POA: Diagnosis not present

## 2017-07-10 DIAGNOSIS — M4302 Spondylolysis, cervical region: Secondary | ICD-10-CM | POA: Diagnosis not present

## 2017-07-11 ENCOUNTER — Other Ambulatory Visit: Payer: Self-pay | Admitting: Physician Assistant

## 2017-07-11 ENCOUNTER — Other Ambulatory Visit: Payer: Self-pay | Admitting: Psychiatry

## 2017-07-11 ENCOUNTER — Ambulatory Visit
Admission: RE | Admit: 2017-07-11 | Discharge: 2017-07-11 | Disposition: A | Discharge status: Home / Self Care | Payer: PPO | Source: Ambulatory Visit | Attending: Physician Assistant | Admitting: Physician Assistant

## 2017-07-11 DIAGNOSIS — R51 Headache: Secondary | ICD-10-CM | POA: Diagnosis not present

## 2017-07-11 DIAGNOSIS — R29810 Facial weakness: Secondary | ICD-10-CM | POA: Diagnosis not present

## 2017-07-11 DIAGNOSIS — R9082 White matter disease, unspecified: Secondary | ICD-10-CM | POA: Insufficient documentation

## 2017-07-11 DIAGNOSIS — R200 Anesthesia of skin: Secondary | ICD-10-CM | POA: Diagnosis not present

## 2017-07-11 DIAGNOSIS — R310 Gross hematuria: Secondary | ICD-10-CM | POA: Diagnosis not present

## 2017-07-19 NOTE — Telephone Encounter (Signed)
Received a fax requesting a 90 day supply . Pt last seen on  06-18-17 next appt 08-13-17   busPIRone (BUSPAR) 10 MG tablet 30 tablet 2 06/18/2017    Sig - Route: Take 1 tablet (10 mg total) by mouth daily. - Oral   Sent to pharmacy as: busPIRone (BUSPAR) 10 MG tablet   E-Prescribing Status: Receipt confirmed by pharmacy (06/18/2017 1:28 PM EDT)

## 2017-08-07 ENCOUNTER — Other Ambulatory Visit: Payer: Self-pay | Admitting: Physical Medicine and Rehabilitation

## 2017-08-07 DIAGNOSIS — M5442 Lumbago with sciatica, left side: Secondary | ICD-10-CM | POA: Diagnosis not present

## 2017-08-07 DIAGNOSIS — M5412 Radiculopathy, cervical region: Secondary | ICD-10-CM | POA: Diagnosis not present

## 2017-08-07 DIAGNOSIS — R739 Hyperglycemia, unspecified: Secondary | ICD-10-CM | POA: Diagnosis not present

## 2017-08-07 DIAGNOSIS — G8929 Other chronic pain: Secondary | ICD-10-CM | POA: Diagnosis not present

## 2017-08-07 DIAGNOSIS — M5416 Radiculopathy, lumbar region: Secondary | ICD-10-CM

## 2017-08-07 DIAGNOSIS — M5030 Other cervical disc degeneration, unspecified cervical region: Secondary | ICD-10-CM | POA: Diagnosis not present

## 2017-08-07 DIAGNOSIS — G4733 Obstructive sleep apnea (adult) (pediatric): Secondary | ICD-10-CM | POA: Diagnosis not present

## 2017-08-07 DIAGNOSIS — R300 Dysuria: Secondary | ICD-10-CM | POA: Diagnosis not present

## 2017-08-07 DIAGNOSIS — E034 Atrophy of thyroid (acquired): Secondary | ICD-10-CM | POA: Diagnosis not present

## 2017-08-07 DIAGNOSIS — F3342 Major depressive disorder, recurrent, in full remission: Secondary | ICD-10-CM | POA: Diagnosis not present

## 2017-08-07 DIAGNOSIS — M5136 Other intervertebral disc degeneration, lumbar region: Secondary | ICD-10-CM | POA: Diagnosis not present

## 2017-08-07 DIAGNOSIS — I10 Essential (primary) hypertension: Secondary | ICD-10-CM | POA: Diagnosis not present

## 2017-08-07 DIAGNOSIS — K219 Gastro-esophageal reflux disease without esophagitis: Secondary | ICD-10-CM | POA: Diagnosis not present

## 2017-08-07 DIAGNOSIS — M5441 Lumbago with sciatica, right side: Secondary | ICD-10-CM | POA: Diagnosis not present

## 2017-08-07 DIAGNOSIS — Z853 Personal history of malignant neoplasm of breast: Secondary | ICD-10-CM | POA: Diagnosis not present

## 2017-08-07 DIAGNOSIS — R42 Dizziness and giddiness: Secondary | ICD-10-CM | POA: Diagnosis not present

## 2017-08-10 ENCOUNTER — Other Ambulatory Visit: Payer: Self-pay | Admitting: Physical Medicine and Rehabilitation

## 2017-08-10 ENCOUNTER — Ambulatory Visit
Admission: RE | Admit: 2017-08-10 | Discharge: 2017-08-10 | Disposition: A | Discharge status: Home / Self Care | Payer: PPO | Source: Ambulatory Visit | Attending: Physical Medicine and Rehabilitation | Admitting: Physical Medicine and Rehabilitation

## 2017-08-10 DIAGNOSIS — M5116 Intervertebral disc disorders with radiculopathy, lumbar region: Secondary | ICD-10-CM | POA: Insufficient documentation

## 2017-08-10 DIAGNOSIS — M5416 Radiculopathy, lumbar region: Secondary | ICD-10-CM | POA: Diagnosis present

## 2017-08-10 DIAGNOSIS — J986 Disorders of diaphragm: Secondary | ICD-10-CM | POA: Insufficient documentation

## 2017-08-10 DIAGNOSIS — G8929 Other chronic pain: Secondary | ICD-10-CM | POA: Insufficient documentation

## 2017-08-10 DIAGNOSIS — M4807 Spinal stenosis, lumbosacral region: Secondary | ICD-10-CM | POA: Diagnosis not present

## 2017-08-10 DIAGNOSIS — R9389 Abnormal findings on diagnostic imaging of other specified body structures: Secondary | ICD-10-CM

## 2017-08-13 ENCOUNTER — Ambulatory Visit: Payer: PPO | Admitting: Psychiatry

## 2017-08-14 ENCOUNTER — Ambulatory Visit
Admission: RE | Admit: 2017-08-14 | Discharge: 2017-08-14 | Disposition: A | Discharge status: Home / Self Care | Payer: PPO | Source: Ambulatory Visit | Attending: Physical Medicine and Rehabilitation | Admitting: Physical Medicine and Rehabilitation

## 2017-08-14 ENCOUNTER — Other Ambulatory Visit
Admission: RE | Admit: 2017-08-14 | Discharge: 2017-08-14 | Disposition: A | Discharge status: Home / Self Care | Payer: PPO | Source: Ambulatory Visit | Attending: Physical Medicine and Rehabilitation | Admitting: Physical Medicine and Rehabilitation

## 2017-08-14 DIAGNOSIS — R9389 Abnormal findings on diagnostic imaging of other specified body structures: Secondary | ICD-10-CM | POA: Diagnosis present

## 2017-08-14 DIAGNOSIS — R079 Chest pain, unspecified: Secondary | ICD-10-CM | POA: Diagnosis not present

## 2017-08-14 DIAGNOSIS — K449 Diaphragmatic hernia without obstruction or gangrene: Secondary | ICD-10-CM | POA: Insufficient documentation

## 2017-08-14 DIAGNOSIS — J90 Pleural effusion, not elsewhere classified: Secondary | ICD-10-CM | POA: Diagnosis not present

## 2017-08-14 DIAGNOSIS — I700 Atherosclerosis of aorta: Secondary | ICD-10-CM | POA: Insufficient documentation

## 2017-08-14 HISTORY — DX: Essential (primary) hypertension: I10

## 2017-08-14 LAB — CREATININE, SERUM
CREATININE: 0.96 mg/dL (ref 0.44–1.00)
GFR, EST NON AFRICAN AMERICAN: 56 mL/min — AB (ref >60)

## 2017-08-14 MED ORDER — IOPAMIDOL (ISOVUE-300) INJECTION 61%
100.0000 mL | Freq: Once | INTRAVENOUS | Status: AC | PRN
  Administered 2017-08-14: 100 mL via INTRAVENOUS

## 2017-08-15 ENCOUNTER — Ambulatory Visit: Payer: PPO

## 2017-08-15 DIAGNOSIS — M5136 Other intervertebral disc degeneration, lumbar region: Secondary | ICD-10-CM | POA: Diagnosis not present

## 2017-08-15 DIAGNOSIS — M5416 Radiculopathy, lumbar region: Secondary | ICD-10-CM | POA: Diagnosis not present

## 2017-08-16 ENCOUNTER — Other Ambulatory Visit: Payer: Self-pay | Admitting: Internal Medicine

## 2017-08-16 DIAGNOSIS — R590 Localized enlarged lymph nodes: Secondary | ICD-10-CM

## 2017-08-16 DIAGNOSIS — Z853 Personal history of malignant neoplasm of breast: Secondary | ICD-10-CM

## 2017-08-22 ENCOUNTER — Ambulatory Visit
Admission: RE | Admit: 2017-08-22 | Discharge: 2017-08-22 | Disposition: A | Discharge status: Home / Self Care | Payer: PPO | Source: Ambulatory Visit | Attending: Internal Medicine | Admitting: Internal Medicine

## 2017-08-22 DIAGNOSIS — C786 Secondary malignant neoplasm of retroperitoneum and peritoneum: Secondary | ICD-10-CM | POA: Insufficient documentation

## 2017-08-22 DIAGNOSIS — R590 Localized enlarged lymph nodes: Secondary | ICD-10-CM

## 2017-08-22 DIAGNOSIS — Z853 Personal history of malignant neoplasm of breast: Secondary | ICD-10-CM | POA: Diagnosis not present

## 2017-08-22 DIAGNOSIS — R918 Other nonspecific abnormal finding of lung field: Secondary | ICD-10-CM | POA: Insufficient documentation

## 2017-08-22 LAB — GLUCOSE, CAPILLARY: Glucose-Capillary: 89 mg/dL (ref 65–99)

## 2017-08-22 MED ORDER — FLUDEOXYGLUCOSE F - 18 (FDG) INJECTION
7.7400 | Freq: Once | INTRAVENOUS | Status: AC | PRN
  Administered 2017-08-22: 7.74 via INTRAVENOUS

## 2017-08-28 ENCOUNTER — Other Ambulatory Visit: Payer: Self-pay | Admitting: *Deleted

## 2017-08-28 ENCOUNTER — Encounter: Payer: Self-pay | Admitting: Oncology

## 2017-08-28 ENCOUNTER — Inpatient Hospital Stay: Payer: PPO | Attending: Oncology | Admitting: Oncology

## 2017-08-28 VITALS — BP 128/67 | HR 81 | Temp 97.4°F | Resp 18 | Ht 66.0 in | Wt 160.1 lb

## 2017-08-28 DIAGNOSIS — R590 Localized enlarged lymph nodes: Secondary | ICD-10-CM | POA: Insufficient documentation

## 2017-08-28 DIAGNOSIS — R918 Other nonspecific abnormal finding of lung field: Secondary | ICD-10-CM | POA: Insufficient documentation

## 2017-08-28 DIAGNOSIS — K219 Gastro-esophageal reflux disease without esophagitis: Secondary | ICD-10-CM | POA: Diagnosis not present

## 2017-08-28 DIAGNOSIS — R948 Abnormal results of function studies of other organs and systems: Secondary | ICD-10-CM | POA: Insufficient documentation

## 2017-08-28 DIAGNOSIS — F329 Major depressive disorder, single episode, unspecified: Secondary | ICD-10-CM | POA: Insufficient documentation

## 2017-08-28 DIAGNOSIS — Z9221 Personal history of antineoplastic chemotherapy: Secondary | ICD-10-CM | POA: Diagnosis not present

## 2017-08-28 DIAGNOSIS — K449 Diaphragmatic hernia without obstruction or gangrene: Secondary | ICD-10-CM | POA: Diagnosis not present

## 2017-08-28 DIAGNOSIS — Z79899 Other long term (current) drug therapy: Secondary | ICD-10-CM | POA: Insufficient documentation

## 2017-08-28 DIAGNOSIS — E039 Hypothyroidism, unspecified: Secondary | ICD-10-CM | POA: Diagnosis not present

## 2017-08-28 DIAGNOSIS — I4891 Unspecified atrial fibrillation: Secondary | ICD-10-CM | POA: Diagnosis not present

## 2017-08-28 DIAGNOSIS — Z853 Personal history of malignant neoplasm of breast: Secondary | ICD-10-CM | POA: Diagnosis not present

## 2017-08-28 DIAGNOSIS — Z9011 Acquired absence of right breast and nipple: Secondary | ICD-10-CM | POA: Diagnosis not present

## 2017-08-28 DIAGNOSIS — G4733 Obstructive sleep apnea (adult) (pediatric): Secondary | ICD-10-CM | POA: Insufficient documentation

## 2017-08-28 DIAGNOSIS — I700 Atherosclerosis of aorta: Secondary | ICD-10-CM | POA: Insufficient documentation

## 2017-08-28 DIAGNOSIS — Z7982 Long term (current) use of aspirin: Secondary | ICD-10-CM | POA: Diagnosis not present

## 2017-08-28 DIAGNOSIS — F419 Anxiety disorder, unspecified: Secondary | ICD-10-CM | POA: Diagnosis not present

## 2017-08-28 DIAGNOSIS — I10 Essential (primary) hypertension: Secondary | ICD-10-CM | POA: Insufficient documentation

## 2017-08-28 DIAGNOSIS — R9389 Abnormal findings on diagnostic imaging of other specified body structures: Secondary | ICD-10-CM

## 2017-08-28 DIAGNOSIS — J90 Pleural effusion, not elsewhere classified: Secondary | ICD-10-CM

## 2017-08-28 NOTE — Progress Notes (Signed)
Hematology/Oncology Consult note [facility] [phone] [phone]  Patient Care Team: Adin Hector, MD as PCP - General (Internal Medicine)   Name of the patient: Breanna Zhang  [id number]  [birth year]    Reason for referral- h/o right breast cancer. Abnormal PET/CT scan   Referring physician- Dr. Caryl Comes  Date of visit: 08/28/17   History of presenting illness-patient is a 77 year old female with a past medical history significant for right breast cancer in 1995.  She underwent mastectomy followed by adjuvant chemotherapy and 5 years of tamoxifen.  Patient has been having some low back pain and underwent a CT lumbar spine without contrast on 08/10/2017 which was done by the pain clinic.  CT mainly showed age-related degenerative disease but also showed incidental nodular thickening of the posterior left diaphragm concerning for tumor and a CT chest was recommended.  CT chest on 08/14/2017 showed scattered left pleural nodularity with tiny left pleural effusion and prominent left internal mammary and juxtadiaphragmatic lymph nodes which are nonspecific but metastatic disease could not be ruled out.  This was followed by a PET/CT scan on 08/22/2017 which showed numerous small left-sided pleural nodules which were mildly hypermetabolic along with hypermetabolic mediastinal lymph nodes concerning for metastatic disease.  Small metastatic nodule in the left upper quadrant partly in the retrocrural space.  No findings of pulmonary metastatic or osseous metastatic disease.  Patient has been referred to [place] for further management.  She is anxious to know about the next steps following her PET scan.  Her appetite is good and she denies any unintentional weight loss.  Her main concern is her chronic back pain   ECOG PS- 1  Pain scale- 5   Review of systems- Review of Systems  Constitutional: Negative for chills, fever, malaise/fatigue and weight loss.   HENT: Negative for congestion, ear discharge and nosebleeds.   Eyes: Negative for blurred vision.  Respiratory: Negative for cough, hemoptysis, sputum production, shortness of breath and wheezing.   Cardiovascular: Negative for chest pain, palpitations, orthopnea and claudication.  Gastrointestinal: Positive for abdominal pain. Negative for blood in stool, constipation, diarrhea, heartburn, melena, nausea and vomiting.  Genitourinary: Negative for dysuria, flank pain, frequency, hematuria and urgency.  Musculoskeletal: Positive for back pain. Negative for joint pain and myalgias.  Skin: Negative for rash.  Neurological: Negative for dizziness, tingling, focal weakness, seizures, weakness and headaches.  Endo/Heme/Allergies: Does not bruise/bleed easily.  Psychiatric/Behavioral: Negative for depression and suicidal ideas. The patient does not have insomnia.     Allergies  Allergen Reactions  . Imipramine Tinitus  . Latex Rash  . Penicillins Rash  . Tape Rash    Patient Active Problem List   Diagnosis Date Noted  . Personal history of malignant neoplasm of breast 09/08/2015  . Adult hypothyroidism 04/06/2015  . Blood glucose elevated 09/07/2014  . Deafness, sensorineural 07/20/2014  . Clinical depression 06/22/2014  . Acid reflux 06/22/2014  . BP (high blood pressure) 06/22/2014  . Malignant neoplasm of breast ([place]) 06/22/2014  . Frequent UTI 06/22/2014  . Apnea, sleep 06/22/2014  . Head revolving around 06/22/2014  . Dizziness 04/16/2014  . Degeneration of intervertebral disc of lumbar region 10/06/2013  . Neuritis or radiculitis due to rupture of lumbar intervertebral disc 10/06/2013  . Arthritis of knee, degenerative 10/06/2013  . Cystocele, midline 11/16/2011  . Female genuine stress incontinence 11/16/2011  . Incomplete bladder emptying 11/16/2011  . 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 Excessive urination at night 11/16/2011  .  Neuralgia neuritis, sciatic  nerve 11/16/2011  . Urge incontinence of urine 11/16/2011     Past Medical History:  Diagnosis Date  . Anxiety   . Atrial fibrillation ([place])   . Breast cancer ([place]) 1999   RT MASTECTOMY  . DDD (degenerative disc disease), cervical   . Depression   . Diverticulosis   . Diverticulosis   . DVT of lower extremity (deep venous thrombosis) ([place])   . GERD (gastroesophageal reflux disease)   . Goiter   . History of chemotherapy 2000   BREAST CA  . Hypertension   . OSA (obstructive sleep apnea)   . Osteoporosis   . Personal history of chemotherapy 1999   BREAST CA  . Status post chemotherapy    2000 right breast cancer  . Thyroid disease   . UTI (urinary tract infection)   . Vertigo      Past Surgical History:  Procedure Laterality Date  . ABDOMINAL HYSTERECTOMY    . BREAST BIOPSY Left 2006   negative  . CHOLECYSTECTOMY    . COCHLEAR IMPLANT Right   . COLONOSCOPY WITH PROPOFOL N/A 07/24/2016   Procedure: COLONOSCOPY WITH PROPOFOL;  Surgeon: Manya Silvas, MD;  Location: [facility] ENDOSCOPY;  Service: Endoscopy;  Laterality: N/A;  . ESOPHAGOGASTRODUODENOSCOPY (EGD) WITH PROPOFOL N/A 04/12/2016   Procedure: ESOPHAGOGASTRODUODENOSCOPY (EGD) WITH PROPOFOL;  Surgeon: Manya Silvas, MD;  Location: [facility] ENDOSCOPY;  Service: Endoscopy;  Laterality: N/A;  . EYE SURGERY    . FOOT SURGERY Bilateral   . HAND SURGERY Right   . MASTECTOMY Right 1999   BREAST CA  . PARTIAL COLECTOMY    . THYROIDECTOMY      Social History   Socioeconomic History  . Marital status: Married    Spouse name: Not on file  . Number of children: Not on file  . Years of education: Not on file  . Highest education level: Not on file  Occupational History  . Not on file  Social Needs  . Financial resource strain: Not on file  . Food insecurity:    Worry: Not on file    Inability: Not on file  . Transportation needs:    Medical: Not on file    Non-medical: Not on file  Tobacco Use  . Smoking  status: Never Smoker  . Smokeless tobacco: Never Used  Substance and Sexual Activity  . Alcohol use: No    Alcohol/week: 0.0 oz  . Drug use: No  . Sexual activity: Never  Lifestyle  . Physical activity:    Days per week: Not on file    Minutes per session: Not on file  . Stress: Not on file  Relationships  . Social connections:    Talks on phone: Not on file    Gets together: Not on file    Attends religious service: Not on file    Active member of club or organization: Not on file    Attends meetings of clubs or organizations: Not on file    Relationship status: Not on file  . Intimate partner violence:    Fear of current or ex partner: Not on file    Emotionally abused: Not on file    Physically abused: Not on file    Forced sexual activity: Not on file  Other Topics Concern  . Not on file  Social History Narrative  . Not on file     Family History  Problem Relation Age of Onset  . Dementia Mother   .  Dementia Sister   . Diabetes Sister   . Heart attack Brother   . COPD Brother   . Colon cancer Brother   . Liver cancer Brother   . COPD Brother   . Breast cancer Neg Hx      Current Outpatient Medications:  .  aspirin EC 81 MG tablet, Take 81 mg by mouth daily., Disp: , Rfl:  .  atorvastatin (LIPITOR) 40 MG tablet, Take 40 mg by mouth at bedtime., Disp: , Rfl:  .  cholecalciferol (VITAMIN D) 1000 units tablet, Take 1,000 Units by mouth daily., Disp: , Rfl:  .  cyanocobalamin (V-R VITAMIN B-12) 500 MCG tablet, Take 500 mg by mouth daily., Disp: , Rfl:  .  DULoxetine (CYMBALTA) 60 MG capsule, Take 1 capsule (60 mg total) by mouth every morning., Disp: 90 capsule, Rfl: 1 .  hydrochlorothiazide (HYDRODIURIL) 12.5 MG tablet, Take 12.5 mg by mouth daily., Disp: , Rfl:  .  pantoprazole (PROTONIX) 40 MG tablet, Take 40 mg by mouth 2 (two) times daily. , Disp: , Rfl: 11 .  SYNTHROID 88 MCG tablet, Take 88 mcg by mouth daily., Disp: , Rfl: 11 .  busPIRone (BUSPAR) 10 MG  tablet, Take 1 tablet (10 mg total) by mouth daily. (Patient not taking: Reported on 08/28/2017), Disp: 30 tablet, Rfl: 2 .  dicyclomine (BENTYL) 10 MG capsule, Take 10 mg by mouth 4 (four) times daily as needed for spasms., Disp: , Rfl:  .  traMADol (ULTRAM) 50 MG tablet, Take 1 tablet (50 mg total) by mouth every 6 (six) hours as needed. (Patient not taking: Reported on 08/28/2017), Disp: 20 tablet, Rfl: 0 .  traZODone (DESYREL) 100 MG tablet, Take 1 tablet (100 mg total) by mouth at bedtime as needed for sleep. (Patient not taking: Reported on 08/28/2017), Disp: 90 tablet, Rfl: 1   Physical exam:  Vitals:   08/28/17 1122  BP: 128/67  Pulse: 81  Resp: 18  Temp: (!) 97.4 F (36.3 C)  TempSrc: Tympanic  SpO2: 98%  Weight: 160 lb 1.6 oz (72.6 kg)  Height: 5\' 6"  (1.676 m)   Physical Exam  Constitutional: She is oriented to person, place, and time.  Elderly female who has a hearing aid in place.  She does not appear to be in any acute distress  HENT:  Head: Normocephalic and atraumatic.  Eyes: Pupils are equal, round, and reactive to light. EOM are normal.  Neck: Normal range of motion.  Cardiovascular: Normal rate, regular rhythm and normal heart sounds.  Pulmonary/Chest: Effort normal and breath sounds normal.  Abdominal: Soft. Bowel sounds are normal. She exhibits no distension. There is no tenderness.  Neurological: She is alert and oriented to person, place, and time.  Skin: Skin is warm and dry.  Patient is status post right mastectomy without reconstruction.  No evidence of chest wall recurrence.  No evidence of palpable masses in the left breast.  No palpable bilateral axillary adenopathy.    CMP Latest Ref Rng & Units 08/14/2017  Glucose 65 - 99 mg/dL -  BUN 6 - 20 mg/dL -  Creatinine 0.44 - 1.00 mg/dL 0.96  Sodium 135 - 145 mmol/L -  Potassium 3.5 - 5.1 mmol/L -  Chloride 101 - 111 mmol/L -  CO2 22 - 32 mmol/L -  Calcium 8.9 - 10.3 mg/dL -  Total Protein 6.5 - 8.1 g/dL -   Total Bilirubin 0.3 - 1.2 mg/dL -  Alkaline Phos 38 - 126 U/L -  AST 15 -  41 U/L -  ALT 14 - 54 U/L -   CBC Latest Ref Rng & Units 06/07/2017  WBC 3.6 - 11.0 K/uL 7.3  Hemoglobin 12.0 - 16.0 g/dL 13.1  Hematocrit 35.0 - 47.0 % 38.9  Platelets 150 - 440 K/uL 211    No images are attached to the encounter.  Ct Chest W Contrast  Result Date: 08/14/2017 CLINICAL DATA:  77 year old female with chest discomfort. LEFT diaphragm thickening identified on recent lumbar spine CT. History of remote RIGHT breast cancer, chemotherapy and mastectomy. EXAM: CT CHEST WITH CONTRAST TECHNIQUE: Multidetector CT imaging of the chest was performed during intravenous contrast administration. CONTRAST:  121mL ISOVUE-300 IOPAMIDOL (ISOVUE-300) INJECTION 61% COMPARISON:  08/10/2017 lumbar spine CT, 09/27/2009 chest CT and other studies. FINDINGS: Cardiovascular: UPPER limits normal heart size noted. Aortic atherosclerotic calcifications noted without aneurysm. Stenosis of the proximal LEFT subclavian artery noted. No pericardial effusion. Mediastinum/Nodes: A new 1 cm LEFT juxta diaphragmatic lymph node (series 2: Image 125) is identified. 4 mm LEFT internal mammary lymph nodes (2:69 and 2:57 are identified.) No other enlarged lymph nodes identified. A small hiatal hernia is noted. Lungs/Pleura: There is scattered areas of mild LEFT pleural nodularity identified including along the anterior mediastinum (2:72, 2:81, 2:87) and along the LOWER posteromedial pleura (2:91, 2:103, 2:113 and 2: 125). A tiny LEFT pleural effusion is noted. No pulmonary airspace disease, consolidation, mass or pneumothorax. Upper Abdomen: No acute abnormality. Musculoskeletal: No acute or suspicious bony abnormalities. IMPRESSION: 1. Scattered mild LEFT pleural nodularity with tiny LEFT pleural effusion and prominent LEFT internal mammary and LEFT juxta diaphragmatic lymph nodes, nonspecific but may represent malignancy/metastatic disease. Consider  tissue sampling and/or PET-CT. 2. Small hiatal hernia 3.  Aortic Atherosclerosis (ICD10-I70.0). Electronically Signed   By: Margarette Canada M.D.   On: 08/14/2017 13:55   Ct Lumbar Spine Wo Contrast  Result Date: 08/10/2017 CLINICAL DATA:  Lumbar radiculopathy.  Chronic low back pain. EXAM: CT LUMBAR SPINE WITHOUT CONTRAST TECHNIQUE: Multidetector CT imaging of the lumbar spine was performed without intravenous contrast administration. Multiplanar CT image reconstructions were also generated. COMPARISON:  08/24/2011 FINDINGS: Segmentation: 5 lumbar type vertebral bodies Alignment: Normal Vertebrae: No acute fracture or focal pathologic process. Paraspinal and other soft tissues: Lobulated diaphragmatic and pleural thickening at the left posterior costophrenic sulcus. This appearance is new from 2014 abdominal CT and not typical of atelectasis or inflammation. Disc levels: T12- L1: Unremarkable. L1-L2: Mild facet spurring and disc narrowing.  No impingement L2-L3: Mild facet spurring and disc narrowing. No visible impingement L3-L4: Mild facet spurring.  No impingement L4-L5: Mild disc bulging.  No impingement L5-S1:Narrowing of the disc with mild left far-lateral endplate ridging. Mild facet spurring. No evidence of impingement. These results will be called to the ordering clinician or representative by the Radiologist Assistant, and communication documented in the PACS or zVision Dashboard. IMPRESSION: 1. Mild for age degenerative disease as described. The canal and foramina appear diffusely patent. No noted progression when compared to 2013. 2. Nodular thickening of the posterior left diaphragm primarily concerning for tumor. Recommend chest CT with contrast. Electronically Signed   By: Monte Fantasia M.D.   On: 08/10/2017 13:02   Nm Pet Image Initial (pi) Skull Base To Thigh  Result Date: 08/22/2017 CLINICAL DATA:  Initial treatment strategy for left pleural nodules. EXAM: NUCLEAR MEDICINE PET SKULL BASE TO  THIGH TECHNIQUE: 7.74 mCi F-18 FDG was injected intravenously. Full-ring PET imaging was performed from the skull base to thigh after the radiotracer.  CT data was obtained and used for attenuation correction and anatomic localization. Fasting blood glucose: 89 mg/dl COMPARISON:  CT chest 08/14/2017 FINDINGS: Mediastinal blood pool activity: SUV max 1.75 NECK: No hypermetabolic lymph nodes in the neck. Incidental CT findings: none CHEST: 9 mm precarinal lymph node on image number 84 is mildly hypermetabolic with SUV max of 7.49. 13 mm subcarinal lymph node is hypermetabolic with SUV max of 4.49 Small left internal mammary lymph node on image number 84 is weakly hypermetabolic with SUV max of 2.2. Pleural nodularity at the left lung base is hypermetabolic with SUV max of 6.75. Small pleural nodules along the left anterior mediastinum are weakly hypermetabolic. 8 mm epicardial lymph node on the left on image number 127 is weakly hypermetabolic with SUV max of 9.16. No enlarged or hypermetabolic supraclavicular or axillary nodes. Incidental CT findings: No worrisome pulmonary nodules. Moderate-sized hiatal hernia. ABDOMEN/PELVIS: In the left upper quadrant there is a small soft tissue lesion which may be partly in the retrocrural space. It measures 15 mm on image number 144 and demonstrates hypermetabolism with SUV max of 4.08. No mesenteric or retroperitoneal lymphadenopathy. No abnormalities involving the solid abdominal organs. No pelvic adenopathy or mass. Incidental CT findings: Moderate diffuse bladder wall thickening and bladder calculi noted. Small cystocele. SKELETON: No focal hypermetabolic activity to suggest skeletal metastasis. Incidental CT findings: none IMPRESSION: 1. Numerous small left-sided pleural nodules and small but hypermetabolic mediastinal lymph nodes, worrisome for metastatic breast cancer. 2. Small metastatic nodule in the left upper quadrant partly in the retrocrural space. 3. No findings  for pulmonary metastatic disease or osseous metastatic disease. Electronically Signed   By: Marijo Sanes M.D.   On: 08/22/2017 11:02    Assessment and plan- Patient is a 77 y.o. female found to have pleural nodularity along with mediastinal adenopathy on recent scans  I have reviewed CT chest and PET/CT images independently and I discussed the findings with the patient and her daughters.  Patient has been found to have left pleural nodularity along with small subcentimeter hypermetabolic lymph nodes in the mediastinum as well as a lymph node seen in the retrocrural left upper quadrant.  This is concerning for malignancy.  However we do need tissue diagnosis at this time.  I will review her scans at our tumor board on 08/30/2017.  Possible sites for biopsy would be EVBU guided biopsy by pulmonary or left pleural biopsy by Dr. Genevive Bi.  I will also try to get prior chemotherapy notes related to her breast cancer treatment  I will call the patient after the tumor board after consensus is reached.  I will tentatively see her in 3 weeks time to discuss the results of the pathology and further management  She is experiencing some pain in her left upper quadrant which could be due to pleural nodularity/retroperitoneal lymph nodes seen around the diaphragm in that region.  Thank you for this kind referral and the opportunity to participate in the care of this patient   Visit Diagnosis 1. Abnormal CT of the chest   2. Abnormal PET scan, mediastinum     Dr. Randa Evens, MD, MPH [facility] [phone] 08/28/2017 3:45 PM

## 2017-08-28 NOTE — Progress Notes (Unsigned)
MDT

## 2017-08-30 ENCOUNTER — Telehealth: Payer: Self-pay | Admitting: *Deleted

## 2017-08-30 DIAGNOSIS — R948 Abnormal results of function studies of other organs and systems: Secondary | ICD-10-CM

## 2017-08-30 DIAGNOSIS — R9389 Abnormal findings on diagnostic imaging of other specified body structures: Secondary | ICD-10-CM

## 2017-08-30 NOTE — Telephone Encounter (Signed)
Dr. Janese Banks discussed case in tumor conference.  The decision was #1 that she would like to do is have IR due a retrocrural lymph node bx. Least invasive method.  The bronch is next thing but the md that does bronch it is not a good idea because it is not easily reached with bronch. The 3 rd option is having Dr. Genevive Bi do a pleural bx.  They  like to try the least invasive method which is bx of lymph node but if it is negative and do not get dx they would like to do the pleural bx which is more invasive.  They are ok with first bx. And would like it as soon as possible. I will sent paper over to get it scheduled and will call daughter tom with update.

## 2017-09-02 ENCOUNTER — Telehealth: Payer: Self-pay | Admitting: *Deleted

## 2017-09-02 NOTE — Telephone Encounter (Signed)
Called daughter to let her know about the bx time and date and it was 6/25 arrive at 103 am.  The daughter states that she was appreciable of my call but the IR dept nurse called and went over the instructions and where to go next week. I did tell her that we will keep 7/8 appt but if the bx comes back with results we will call and see pt sooner and if it comes back negative then we will need to schedule the other option of Dr. Genevive Bi doing a peural bx. She understands.

## 2017-09-03 ENCOUNTER — Other Ambulatory Visit: Payer: Self-pay | Admitting: Radiology

## 2017-09-04 ENCOUNTER — Ambulatory Visit
Admission: RE | Admit: 2017-09-04 | Discharge: 2017-09-04 | Disposition: A | Discharge status: Home / Self Care | Payer: PPO | Source: Ambulatory Visit | Attending: Oncology | Admitting: Oncology

## 2017-09-04 DIAGNOSIS — R591 Generalized enlarged lymph nodes: Secondary | ICD-10-CM | POA: Diagnosis not present

## 2017-09-04 DIAGNOSIS — Z7982 Long term (current) use of aspirin: Secondary | ICD-10-CM | POA: Diagnosis not present

## 2017-09-04 DIAGNOSIS — I10 Essential (primary) hypertension: Secondary | ICD-10-CM | POA: Diagnosis not present

## 2017-09-04 DIAGNOSIS — Z880 Allergy status to penicillin: Secondary | ICD-10-CM | POA: Insufficient documentation

## 2017-09-04 DIAGNOSIS — F329 Major depressive disorder, single episode, unspecified: Secondary | ICD-10-CM | POA: Insufficient documentation

## 2017-09-04 DIAGNOSIS — Z9011 Acquired absence of right breast and nipple: Secondary | ICD-10-CM | POA: Insufficient documentation

## 2017-09-04 DIAGNOSIS — R9389 Abnormal findings on diagnostic imaging of other specified body structures: Secondary | ICD-10-CM | POA: Diagnosis present

## 2017-09-04 DIAGNOSIS — R948 Abnormal results of function studies of other organs and systems: Secondary | ICD-10-CM

## 2017-09-04 DIAGNOSIS — R590 Localized enlarged lymph nodes: Secondary | ICD-10-CM | POA: Diagnosis not present

## 2017-09-04 DIAGNOSIS — Z9221 Personal history of antineoplastic chemotherapy: Secondary | ICD-10-CM | POA: Diagnosis not present

## 2017-09-04 DIAGNOSIS — E079 Disorder of thyroid, unspecified: Secondary | ICD-10-CM | POA: Insufficient documentation

## 2017-09-04 DIAGNOSIS — K219 Gastro-esophageal reflux disease without esophagitis: Secondary | ICD-10-CM | POA: Insufficient documentation

## 2017-09-04 DIAGNOSIS — Z79899 Other long term (current) drug therapy: Secondary | ICD-10-CM | POA: Insufficient documentation

## 2017-09-04 DIAGNOSIS — G4733 Obstructive sleep apnea (adult) (pediatric): Secondary | ICD-10-CM | POA: Diagnosis not present

## 2017-09-04 DIAGNOSIS — I4891 Unspecified atrial fibrillation: Secondary | ICD-10-CM | POA: Diagnosis not present

## 2017-09-04 DIAGNOSIS — Z86718 Personal history of other venous thrombosis and embolism: Secondary | ICD-10-CM | POA: Diagnosis not present

## 2017-09-04 DIAGNOSIS — Z853 Personal history of malignant neoplasm of breast: Secondary | ICD-10-CM | POA: Diagnosis not present

## 2017-09-04 DIAGNOSIS — Z888 Allergy status to other drugs, medicaments and biological substances status: Secondary | ICD-10-CM | POA: Diagnosis not present

## 2017-09-04 DIAGNOSIS — C786 Secondary malignant neoplasm of retroperitoneum and peritoneum: Secondary | ICD-10-CM | POA: Diagnosis not present

## 2017-09-04 DIAGNOSIS — F419 Anxiety disorder, unspecified: Secondary | ICD-10-CM | POA: Insufficient documentation

## 2017-09-04 LAB — CBC
HCT: 37.6 % (ref 35.0–47.0)
HEMOGLOBIN: 12.8 g/dL (ref 12.0–16.0)
MCH: 29.4 pg (ref 26.0–34.0)
MCHC: 34 g/dL (ref 32.0–36.0)
MCV: 86.6 fL (ref 80.0–100.0)
Platelets: 267 10*3/uL (ref 150–440)
RBC: 4.34 MIL/uL (ref 3.80–5.20)
RDW: 13.4 % (ref 11.5–14.5)
WBC: 5.6 10*3/uL (ref 3.6–11.0)

## 2017-09-04 LAB — CREATININE, SERUM
Creatinine, Ser: 0.88 mg/dL (ref 0.44–1.00)
GFR calc non Af Amer: >60 mL/min (ref >60)

## 2017-09-04 LAB — PROTIME-INR
INR: 1.25
PROTHROMBIN TIME: 15.6 s — AB (ref 11.4–15.2)

## 2017-09-04 MED ORDER — IOPAMIDOL (ISOVUE-300) INJECTION 61%
75.0000 mL | Freq: Once | INTRAVENOUS | Status: AC | PRN
  Administered 2017-09-04: 75 mL via INTRAVENOUS

## 2017-09-04 MED ORDER — HYDROCODONE-ACETAMINOPHEN 5-325 MG PO TABS
ORAL_TABLET | ORAL | Status: AC
  Administered 2017-09-04: 1 via ORAL
  Filled 2017-09-04: qty 1

## 2017-09-04 MED ORDER — FENTANYL CITRATE (PF) 100 MCG/2ML IJ SOLN
INTRAMUSCULAR | Status: AC
  Filled 2017-09-04: qty 2

## 2017-09-04 MED ORDER — SODIUM CHLORIDE 0.9 % IV SOLN
INTRAVENOUS | Status: DC
  Administered 2017-09-04: 12:00:00 via INTRAVENOUS

## 2017-09-04 MED ORDER — FENTANYL CITRATE (PF) 100 MCG/2ML IJ SOLN
INTRAMUSCULAR | Status: AC | PRN
  Administered 2017-09-04 (×3): 50 ug via INTRAVENOUS

## 2017-09-04 MED ORDER — MIDAZOLAM HCL 5 MG/5ML IJ SOLN
INTRAMUSCULAR | Status: AC
  Filled 2017-09-04: qty 5

## 2017-09-04 MED ORDER — HYDROCODONE-ACETAMINOPHEN 5-325 MG PO TABS
ORAL_TABLET | ORAL | Status: AC
  Filled 2017-09-04: qty 1

## 2017-09-04 MED ORDER — HYDROCODONE-ACETAMINOPHEN 5-325 MG PO TABS
1.0000 | ORAL_TABLET | ORAL | Status: DC | PRN
  Administered 2017-09-04 (×2): 1 via ORAL

## 2017-09-04 MED ORDER — MIDAZOLAM HCL 2 MG/2ML IJ SOLN
INTRAMUSCULAR | Status: AC | PRN
  Administered 2017-09-04 (×3): 1 mg via INTRAVENOUS

## 2017-09-04 NOTE — H&P (Signed)
Chief Complaint: Patient was seen in consultation today for CT-guided lymph node biopsy at the request of Rao,Archana C  Referring Physician(s): Rao,Archana C  Patient Status: [facility] - Out-pt  History of Present Illness: Breanna Zhang is a 77 y.o. female with remote history of right breast cancer in 1995.  Patient has been having low back pain and had a CT lumbar spine that demonstrated nodular thickening along the posterior left hemidiaphragm.  Follow-up imaging has demonstrated mild left pleural nodularity and small lymph nodes.  Patient has a suspicious lymph node in the left retrocrural space and patient needs a tissue diagnosis.  Patient main complaint is her back and left leg symptoms.  She complains of some difficulty breathing at night but feels like this is related to abdominal distention.  No problems with her urinary tract or bowels.  Past Medical History:  Diagnosis Date  . Anxiety   . Atrial fibrillation ([place])   . Breast cancer ([place]) 1999   RT MASTECTOMY  . DDD (degenerative disc disease), cervical   . Depression   . Diverticulosis   . Diverticulosis   . DVT of lower extremity (deep venous thrombosis) ([place])   . GERD (gastroesophageal reflux disease)   . Goiter   . History of chemotherapy 2000   BREAST CA  . Hypertension   . OSA (obstructive sleep apnea)   . Osteoporosis   . Personal history of chemotherapy 1999   BREAST CA  . Status post chemotherapy    2000 right breast cancer  . Thyroid disease   . UTI (urinary tract infection)   . Vertigo     Past Surgical History:  Procedure Laterality Date  . ABDOMINAL HYSTERECTOMY    . BREAST BIOPSY Left 2006   negative  . CHOLECYSTECTOMY    . COCHLEAR IMPLANT Right   . COLONOSCOPY WITH PROPOFOL N/A 07/24/2016   Procedure: COLONOSCOPY WITH PROPOFOL;  Surgeon: Manya Silvas, MD;  Location: [facility] ENDOSCOPY;  Service: Endoscopy;  Laterality: N/A;  . ESOPHAGOGASTRODUODENOSCOPY (EGD) WITH PROPOFOL N/A 04/12/2016   Procedure: ESOPHAGOGASTRODUODENOSCOPY (EGD) WITH PROPOFOL;  Surgeon: Manya Silvas, MD;  Location: [facility] ENDOSCOPY;  Service: Endoscopy;  Laterality: N/A;  . EYE SURGERY    . FOOT SURGERY Bilateral   . HAND SURGERY Right   . MASTECTOMY Right 1999   BREAST CA  . PARTIAL COLECTOMY    . THYROIDECTOMY      Allergies: Imipramine; Latex; Penicillins; and Tape  Medications: Prior to Admission medications   Medication Sig Start Date End Date Taking? Authorizing Provider  aspirin EC 81 MG tablet Take 81 mg by mouth daily.   Yes [provider]  atorvastatin (LIPITOR) 40 MG tablet Take 40 mg by mouth at bedtime. 09/07/14  Yes [provider]  cholecalciferol (VITAMIN D) 1000 units tablet Take 1,000 Units by mouth daily.   Yes [provider]  cyanocobalamin (V-R VITAMIN B-12) 500 MCG tablet Take 500 mg by mouth daily.   Yes [provider]  dicyclomine (BENTYL) 10 MG capsule Take 10 mg by mouth 4 (four) times daily as needed for spasms.   Yes [provider]  DULoxetine (CYMBALTA) 60 MG capsule Take 1 capsule (60 mg total) by mouth every morning. 06/18/17  Yes Rainey Pines, MD  hydrochlorothiazide (HYDRODIURIL) 12.5 MG tablet Take 12.5 mg by mouth daily.   Yes [provider]  pantoprazole (PROTONIX) 40 MG tablet Take 40 mg by mouth 2 (two) times daily.    Yes [provider]  SYNTHROID 88 MCG tablet Take 88 mcg by mouth daily. 03/04/15  Yes [provider]  traMADol (ULTRAM) 50 MG tablet Take 1 tablet (50 mg total) by mouth every 6 (six) hours as needed. 06/07/17 06/07/18 Yes Lavonia Drafts, MD  busPIRone (BUSPAR) 10 MG tablet Take 1 tablet (10 mg total) by mouth daily. Patient not taking: Reported on 08/28/2017 06/18/17   Rainey Pines, MD  traZODone (DESYREL) 100 MG tablet Take 1 tablet (100 mg total) by mouth at bedtime as needed for sleep. Patient not taking: Reported on 08/28/2017 06/18/17   Rainey Pines, MD     Family History    Problem Relation Age of Onset  . Dementia Mother   . Dementia Sister   . Diabetes Sister   . Heart attack Brother   . COPD Brother   . Colon cancer Brother   . Liver cancer Brother   . COPD Brother   . Breast cancer Neg Hx     Social History   Socioeconomic History  . Marital status: Married    Spouse name: Not on file  . Number of children: Not on file  . Years of education: Not on file  . Highest education level: Not on file  Occupational History  . Not on file  Social Needs  . Financial resource strain: Not on file  . Food insecurity:    Worry: Not on file    Inability: Not on file  . Transportation needs:    Medical: Not on file    Non-medical: Not on file  Tobacco Use  . Smoking status: Never Smoker  . Smokeless tobacco: Never Used  Substance and Sexual Activity  . Alcohol use: No    Alcohol/week: 0.0 oz  . Drug use: No  . Sexual activity: Never  Lifestyle  . Physical activity:    Days per week: Not on file    Minutes per session: Not on file  . Stress: Not on file  Relationships  . Social connections:    Talks on phone: Not on file    Gets together: Not on file    Attends religious service: Not on file    Active member of club or organization: Not on file    Attends meetings of clubs or organizations: Not on file    Relationship status: Not on file  Other Topics Concern  . Not on file  Social History Narrative  . Not on file     Review of Systems: A 12 point ROS discussed and pertinent positives are indicated in the HPI above.  All other systems are negative.  Review of Systems  Constitutional: Negative.   Respiratory: Positive for shortness of breath.   Cardiovascular: Negative for chest pain.  Gastrointestinal: Negative.   Genitourinary: Negative.   Musculoskeletal: Positive for back pain.    Vital Signs: BP 138/65   Pulse 69   Temp 97.9 F (36.6 C) (Oral)   Resp 20   Ht 5\' 6"  (1.676 m)   Wt 160 lb (72.6 kg)   SpO2 100%   BMI  25.82 kg/m   Physical Exam  Constitutional: No distress.  HENT:  Mouth/Throat: Oropharynx is clear and moist.  Cardiovascular: Normal rate, regular rhythm and normal heart sounds.  Pulmonary/Chest: Effort normal and breath sounds normal.  Abdominal: Soft. Bowel sounds are normal. She exhibits no distension. There is tenderness.    Imaging: Ct Chest W Contrast  Result Date: 08/14/2017 CLINICAL DATA:  77 year old female with chest discomfort.  LEFT diaphragm thickening identified on recent lumbar spine CT. History of remote RIGHT breast cancer, chemotherapy and mastectomy. EXAM: CT CHEST WITH CONTRAST TECHNIQUE: Multidetector CT imaging of the chest was performed during intravenous contrast administration. CONTRAST:  129mL ISOVUE-300 IOPAMIDOL (ISOVUE-300) INJECTION 61% COMPARISON:  08/10/2017 lumbar spine CT, 09/27/2009 chest CT and other studies. FINDINGS: Cardiovascular: UPPER limits normal heart size noted. Aortic atherosclerotic calcifications noted without aneurysm. Stenosis of the proximal LEFT subclavian artery noted. No pericardial effusion. Mediastinum/Nodes: A new 1 cm LEFT juxta diaphragmatic lymph node (series 2: Image 125) is identified. 4 mm LEFT internal mammary lymph nodes (2:69 and 2:57 are identified.) No other enlarged lymph nodes identified. A small hiatal hernia is noted. Lungs/Pleura: There is scattered areas of mild LEFT pleural nodularity identified including along the anterior mediastinum (2:72, 2:81, 2:87) and along the LOWER posteromedial pleura (2:91, 2:103, 2:113 and 2: 125). A tiny LEFT pleural effusion is noted. No pulmonary airspace disease, consolidation, mass or pneumothorax. Upper Abdomen: No acute abnormality. Musculoskeletal: No acute or suspicious bony abnormalities. IMPRESSION: 1. Scattered mild LEFT pleural nodularity with tiny LEFT pleural effusion and prominent LEFT internal mammary and LEFT juxta diaphragmatic lymph nodes, nonspecific but may represent  malignancy/metastatic disease. Consider tissue sampling and/or PET-CT. 2. Small hiatal hernia 3.  Aortic Atherosclerosis (ICD10-I70.0). Electronically Signed   By: Margarette Canada M.D.   On: 08/14/2017 13:55   Ct Lumbar Spine Wo Contrast  Result Date: 08/10/2017 CLINICAL DATA:  Lumbar radiculopathy.  Chronic low back pain. EXAM: CT LUMBAR SPINE WITHOUT CONTRAST TECHNIQUE: Multidetector CT imaging of the lumbar spine was performed without intravenous contrast administration. Multiplanar CT image reconstructions were also generated. COMPARISON:  08/24/2011 FINDINGS: Segmentation: 5 lumbar type vertebral bodies Alignment: Normal Vertebrae: No acute fracture or focal pathologic process. Paraspinal and other soft tissues: Lobulated diaphragmatic and pleural thickening at the left posterior costophrenic sulcus. This appearance is new from 2014 abdominal CT and not typical of atelectasis or inflammation. Disc levels: T12- L1: Unremarkable. L1-L2: Mild facet spurring and disc narrowing.  No impingement L2-L3: Mild facet spurring and disc narrowing. No visible impingement L3-L4: Mild facet spurring.  No impingement L4-L5: Mild disc bulging.  No impingement L5-S1:Narrowing of the disc with mild left far-lateral endplate ridging. Mild facet spurring. No evidence of impingement. These results will be called to the ordering clinician or representative by the Radiologist Assistant, and communication documented in the PACS or zVision Dashboard. IMPRESSION: 1. Mild for age degenerative disease as described. The canal and foramina appear diffusely patent. No noted progression when compared to 2013. 2. Nodular thickening of the posterior left diaphragm primarily concerning for tumor. Recommend chest CT with contrast. Electronically Signed   By: Monte Fantasia M.D.   On: 08/10/2017 13:02   Nm Pet Image Initial (pi) Skull Base To Thigh  Result Date: 08/22/2017 CLINICAL DATA:  Initial treatment strategy for left pleural nodules.  EXAM: NUCLEAR MEDICINE PET SKULL BASE TO THIGH TECHNIQUE: 7.74 mCi F-18 FDG was injected intravenously. Full-ring PET imaging was performed from the skull base to thigh after the radiotracer. CT data was obtained and used for attenuation correction and anatomic localization. Fasting blood glucose: 89 mg/dl COMPARISON:  CT chest 08/14/2017 FINDINGS: Mediastinal blood pool activity: SUV max 1.75 NECK: No hypermetabolic lymph nodes in the neck. Incidental CT findings: none CHEST: 9 mm precarinal lymph node on image number 84 is mildly hypermetabolic with SUV max of 7.56. 13 mm subcarinal lymph node is hypermetabolic with SUV max of 4.33 Small left  internal mammary lymph node on image number 84 is weakly hypermetabolic with SUV max of 2.2. Pleural nodularity at the left lung base is hypermetabolic with SUV max of 1.61. Small pleural nodules along the left anterior mediastinum are weakly hypermetabolic. 8 mm epicardial lymph node on the left on image number 127 is weakly hypermetabolic with SUV max of 0.96. No enlarged or hypermetabolic supraclavicular or axillary nodes. Incidental CT findings: No worrisome pulmonary nodules. Moderate-sized hiatal hernia. ABDOMEN/PELVIS: In the left upper quadrant there is a small soft tissue lesion which may be partly in the retrocrural space. It measures 15 mm on image number 144 and demonstrates hypermetabolism with SUV max of 4.08. No mesenteric or retroperitoneal lymphadenopathy. No abnormalities involving the solid abdominal organs. No pelvic adenopathy or mass. Incidental CT findings: Moderate diffuse bladder wall thickening and bladder calculi noted. Small cystocele. SKELETON: No focal hypermetabolic activity to suggest skeletal metastasis. Incidental CT findings: none IMPRESSION: 1. Numerous small left-sided pleural nodules and small but hypermetabolic mediastinal lymph nodes, worrisome for metastatic breast cancer. 2. Small metastatic nodule in the left upper quadrant partly in  the retrocrural space. 3. No findings for pulmonary metastatic disease or osseous metastatic disease. Electronically Signed   By: Marijo Sanes M.D.   On: 08/22/2017 11:02    Labs:  CBC: Recent Labs    06/07/17 0921 09/04/17 1121  WBC 7.3 5.6  HGB 13.1 12.8  HCT 38.9 37.6  PLT 211 267    COAGS: Recent Labs    09/04/17 1121  INR 1.25    BMP: Recent Labs    06/07/17 0921 08/14/17 1043 09/04/17 1121  NA 139  --   --   K 3.8  --   --   CL 102  --   --   CO2 28  --   --   GLUCOSE 92  --   --   BUN 14  --   --   CALCIUM 8.7*  --   --   CREATININE 0.77 0.96 0.88  GFRNONAA >60 56* >60  GFRAA >60 >60 >60    LIVER FUNCTION TESTS: Recent Labs    06/07/17 0921  BILITOT 0.8  AST 23  ALT 16  ALKPHOS 53  PROT 7.2  ALBUMIN 3.8    TUMOR MARKERS: No results for input(s): AFPTM, CEA, CA199, CHROMGRNA in the last 8760 hours.  Assessment and Plan:  77 year old with left pleural nodularity and small nonspecific hypermetabolic lymph nodes in the chest and upper abdomen based on recent PET/CT.  Tissue diagnosis is needed.  We discussed CT-guided biopsy of the left retrocrural lymph node.  I explained to the patient that this area is very small and poorly seen on the previous PET/CT.  I explained that I would like to perform a diagnostic CT through this area with IV contrast to better delineate the anatomy.  We discussed the CT-guided procedure with moderate sedation.  We discussed the risks which include bleeding.  Patient and family have a very good understanding of the procedure risks and benefits.  I did explain that the biopsy could be nondiagnostic due to the small size of the lymph node.  Plan for CT-guided biopsy of left retrocrural lymph node with moderate sedation.   Thank you for this interesting consult.  I greatly enjoyed meeting Breanna Zhang and look forward to participating in their care.  A copy of this report was sent to the requesting provider on this  date.  Electronically Signed: Burman Riis,  MD 09/04/2017, 12:34 PM   I spent a total of  15 Minutes   in face to face in clinical consultation, greater than 50% of which was counseling/coordinating care for CT-guided lymph node biopsy.

## 2017-09-04 NOTE — Procedures (Signed)
CT guided aspiration of left retrocrural node.  4 FNAs performed.  Could not safely get core samples.  Minimal bleeding at biopsy site.  No immediate complication.

## 2017-09-04 NOTE — Progress Notes (Signed)
Pt with pain in mid back about 4 inches below incision site. MD Dr. Anselm Pancoast notified at 1445 and MD at bedside 1530 to assess. Per Dr. Anselm Pancoast, to get pt OOB prior to d/c and void. Pt assisted up to BR at 1550, pain improving 3/10. VS obtained once back in bed stable. MD notified and ok to d/c Patient home. Pt site intact, improving pain below site.

## 2017-09-05 LAB — CYTOLOGY - NON PAP

## 2017-09-07 ENCOUNTER — Encounter: Payer: Self-pay | Admitting: Cardiothoracic Surgery

## 2017-09-07 ENCOUNTER — Ambulatory Visit (INDEPENDENT_AMBULATORY_CARE_PROVIDER_SITE_OTHER): Payer: PPO | Admitting: Cardiothoracic Surgery

## 2017-09-07 ENCOUNTER — Other Ambulatory Visit: Payer: Self-pay

## 2017-09-07 ENCOUNTER — Encounter
Admission: RE | Admit: 2017-09-07 | Discharge: 2017-09-07 | Disposition: A | Discharge status: Home / Self Care | Payer: PPO | Source: Ambulatory Visit | Attending: Cardiothoracic Surgery | Admitting: Cardiothoracic Surgery

## 2017-09-07 VITALS — BP 127/78 | HR 89 | Temp 97.7°F | Ht 66.0 in | Wt 162.0 lb

## 2017-09-07 DIAGNOSIS — E876 Hypokalemia: Secondary | ICD-10-CM | POA: Diagnosis not present

## 2017-09-07 DIAGNOSIS — Z9011 Acquired absence of right breast and nipple: Secondary | ICD-10-CM | POA: Diagnosis not present

## 2017-09-07 DIAGNOSIS — Z90710 Acquired absence of both cervix and uterus: Secondary | ICD-10-CM | POA: Diagnosis not present

## 2017-09-07 DIAGNOSIS — I739 Peripheral vascular disease, unspecified: Secondary | ICD-10-CM | POA: Diagnosis not present

## 2017-09-07 DIAGNOSIS — C782 Secondary malignant neoplasm of pleura: Secondary | ICD-10-CM | POA: Diagnosis not present

## 2017-09-07 DIAGNOSIS — Z833 Family history of diabetes mellitus: Secondary | ICD-10-CM | POA: Diagnosis not present

## 2017-09-07 DIAGNOSIS — Z9049 Acquired absence of other specified parts of digestive tract: Secondary | ICD-10-CM | POA: Diagnosis not present

## 2017-09-07 DIAGNOSIS — R918 Other nonspecific abnormal finding of lung field: Secondary | ICD-10-CM | POA: Diagnosis present

## 2017-09-07 DIAGNOSIS — M810 Age-related osteoporosis without current pathological fracture: Secondary | ICD-10-CM | POA: Diagnosis present

## 2017-09-07 DIAGNOSIS — E890 Postprocedural hypothyroidism: Secondary | ICD-10-CM | POA: Diagnosis present

## 2017-09-07 DIAGNOSIS — Z800 Family history of malignant neoplasm of digestive organs: Secondary | ICD-10-CM | POA: Diagnosis not present

## 2017-09-07 DIAGNOSIS — G4733 Obstructive sleep apnea (adult) (pediatric): Secondary | ICD-10-CM | POA: Diagnosis present

## 2017-09-07 DIAGNOSIS — Z9221 Personal history of antineoplastic chemotherapy: Secondary | ICD-10-CM | POA: Diagnosis not present

## 2017-09-07 DIAGNOSIS — F329 Major depressive disorder, single episode, unspecified: Secondary | ICD-10-CM | POA: Diagnosis present

## 2017-09-07 DIAGNOSIS — I4891 Unspecified atrial fibrillation: Secondary | ICD-10-CM | POA: Diagnosis present

## 2017-09-07 DIAGNOSIS — Z01810 Encounter for preprocedural cardiovascular examination: Secondary | ICD-10-CM | POA: Diagnosis not present

## 2017-09-07 DIAGNOSIS — Z4682 Encounter for fitting and adjustment of non-vascular catheter: Secondary | ICD-10-CM | POA: Diagnosis not present

## 2017-09-07 DIAGNOSIS — K219 Gastro-esophageal reflux disease without esophagitis: Secondary | ICD-10-CM | POA: Diagnosis present

## 2017-09-07 DIAGNOSIS — C3492 Malignant neoplasm of unspecified part of left bronchus or lung: Secondary | ICD-10-CM | POA: Diagnosis not present

## 2017-09-07 DIAGNOSIS — R222 Localized swelling, mass and lump, trunk: Secondary | ICD-10-CM | POA: Diagnosis not present

## 2017-09-07 DIAGNOSIS — Z9621 Cochlear implant status: Secondary | ICD-10-CM | POA: Diagnosis present

## 2017-09-07 DIAGNOSIS — Z9089 Acquired absence of other organs: Secondary | ICD-10-CM | POA: Diagnosis not present

## 2017-09-07 DIAGNOSIS — E039 Hypothyroidism, unspecified: Secondary | ICD-10-CM | POA: Diagnosis not present

## 2017-09-07 DIAGNOSIS — Z853 Personal history of malignant neoplasm of breast: Secondary | ICD-10-CM | POA: Diagnosis not present

## 2017-09-07 DIAGNOSIS — F419 Anxiety disorder, unspecified: Secondary | ICD-10-CM | POA: Diagnosis present

## 2017-09-07 DIAGNOSIS — K449 Diaphragmatic hernia without obstruction or gangrene: Secondary | ICD-10-CM | POA: Diagnosis present

## 2017-09-07 DIAGNOSIS — Z8719 Personal history of other diseases of the digestive system: Secondary | ICD-10-CM | POA: Diagnosis not present

## 2017-09-07 DIAGNOSIS — Z8249 Family history of ischemic heart disease and other diseases of the circulatory system: Secondary | ICD-10-CM | POA: Diagnosis not present

## 2017-09-07 DIAGNOSIS — J9811 Atelectasis: Secondary | ICD-10-CM | POA: Diagnosis not present

## 2017-09-07 DIAGNOSIS — J939 Pneumothorax, unspecified: Secondary | ICD-10-CM | POA: Diagnosis not present

## 2017-09-07 DIAGNOSIS — Z86718 Personal history of other venous thrombosis and embolism: Secondary | ICD-10-CM | POA: Diagnosis not present

## 2017-09-07 DIAGNOSIS — Z825 Family history of asthma and other chronic lower respiratory diseases: Secondary | ICD-10-CM | POA: Diagnosis not present

## 2017-09-07 DIAGNOSIS — I10 Essential (primary) hypertension: Secondary | ICD-10-CM | POA: Diagnosis present

## 2017-09-07 DIAGNOSIS — C7802 Secondary malignant neoplasm of left lung: Secondary | ICD-10-CM | POA: Diagnosis present

## 2017-09-07 HISTORY — DX: Headache: R51

## 2017-09-07 HISTORY — DX: Dyspnea, unspecified: R06.00

## 2017-09-07 HISTORY — DX: Headache, unspecified: R51.9

## 2017-09-07 HISTORY — DX: Unspecified urinary incontinence: R32

## 2017-09-07 LAB — CBC WITH DIFFERENTIAL/PLATELET
Basophils Absolute: 0 10*3/uL (ref 0–0.1)
Basophils Relative: 1 %
Eosinophils Absolute: 0.1 10*3/uL (ref 0–0.7)
Eosinophils Relative: 2 %
HCT: 36.9 % (ref 35.0–47.0)
Hemoglobin: 12.7 g/dL (ref 12.0–16.0)
LYMPHS ABS: 2.4 10*3/uL (ref 1.0–3.6)
LYMPHS PCT: 39 %
MCH: 29.8 pg (ref 26.0–34.0)
MCHC: 34.3 g/dL (ref 32.0–36.0)
MCV: 87 fL (ref 80.0–100.0)
MONO ABS: 0.6 10*3/uL (ref 0.2–0.9)
Monocytes Relative: 9 %
Neutro Abs: 3.2 10*3/uL (ref 1.4–6.5)
Neutrophils Relative %: 49 %
Platelets: 256 10*3/uL (ref 150–440)
RBC: 4.24 MIL/uL (ref 3.80–5.20)
RDW: 13 % (ref 11.5–14.5)
WBC: 6.3 10*3/uL (ref 3.6–11.0)

## 2017-09-07 LAB — COMPREHENSIVE METABOLIC PANEL
ALBUMIN: 3.9 g/dL (ref 3.5–5.0)
ALT: 14 U/L (ref 0–44)
ANION GAP: 8 (ref 5–15)
AST: 27 U/L (ref 15–41)
Alkaline Phosphatase: 59 U/L (ref 38–126)
BILIRUBIN TOTAL: 0.7 mg/dL (ref 0.3–1.2)
BUN: 9 mg/dL (ref 8–23)
CO2: 28 mmol/L (ref 22–32)
Calcium: 8.3 mg/dL — ABNORMAL LOW (ref 8.9–10.3)
Chloride: 102 mmol/L (ref 98–111)
Creatinine, Ser: 0.79 mg/dL (ref 0.44–1.00)
GFR calc Af Amer: >60 mL/min (ref >60)
Glucose, Bld: 93 mg/dL (ref 70–99)
POTASSIUM: 3.3 mmol/L — AB (ref 3.5–5.1)
Sodium: 138 mmol/L (ref 135–145)
Total Protein: 7.4 g/dL (ref 6.5–8.1)

## 2017-09-07 LAB — APTT: APTT: 28 s (ref 24–36)

## 2017-09-07 LAB — SURGICAL PCR SCREEN
MRSA, PCR: NEGATIVE
Staphylococcus aureus: NEGATIVE

## 2017-09-07 LAB — PROTIME-INR
INR: 1.24
PROTHROMBIN TIME: 15.5 s — AB (ref 11.4–15.2)

## 2017-09-07 NOTE — H&P (View-Only) (Signed)
Patient ID: Breanna Zhang, female   DOB: [birth year], 77 y.o.   MRN: [id number]  No chief complaint on file.   Referred By Dr. Janese Banks Reason for Referral left pleural-based mass  HPI Location, Quality, Duration, Severity, Timing, Context, Modifying Factors, Associated Signs and Symptoms.  Breanna Zhang is a 77 y.o. female.  Her problems began several months ago when she began experiencing severe headaches as well as some neck pain.  A complete work-up for that failed to reveal any specific etiology but she did undergo injections into her spine which helped considerably.  Then she developed some pain in her back and down her left leg.  She was then seen where a CT scan was performed.  This was of the thoracic and lumbar spine area.  This revealed a left retrocrural lymph node and ultimately a biopsy of that was consistent with carcinoma but insufficient material was available for complete evaluation.  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  A PET scan showed some low-level uptake in these areas again consistent with metastatic disease.  I have been asked to see the patient for consideration of biopsy of 1 of these lesions.  The patient does relate to me that she has had some chest pain which she describes is on her side and over the last several weeks.  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  She gets short of breath with moderate exertion.  She also has a hiatal hernia.  She has had prior history of mastectomy many years ago.  She also has a history of diverticulitis having undergone colon resection for that.   Past Medical History:  Diagnosis Date  . Anxiety   . Atrial fibrillation ([place])   . Breast cancer ([place]) 1999   RT MASTECTOMY  . DDD (degenerative disc disease), cervical   . Depression   . Diverticulosis   . Diverticulosis   . DVT of lower extremity (deep venous thrombosis) ([place])   . GERD  (gastroesophageal reflux disease)   . Goiter   . History of chemotherapy 2000   BREAST CA  . Hypertension   . OSA (obstructive sleep apnea)   . Osteoporosis   . Personal history of chemotherapy 1999   BREAST CA  . Status post chemotherapy    2000 right breast cancer  . Thyroid disease   . UTI (urinary tract infection)   . Vertigo     Past Surgical History:  Procedure Laterality Date  . ABDOMINAL HYSTERECTOMY    . BREAST BIOPSY Left 2006   negative  . CHOLECYSTECTOMY    . COCHLEAR IMPLANT Right   . COLONOSCOPY WITH PROPOFOL N/A 07/24/2016   Procedure: COLONOSCOPY WITH PROPOFOL;  Surgeon: Manya Silvas, MD;  Location: [facility] ENDOSCOPY;  Service: Endoscopy;  Laterality: N/A;  . ESOPHAGOGASTRODUODENOSCOPY (EGD) WITH PROPOFOL N/A 04/12/2016   Procedure: ESOPHAGOGASTRODUODENOSCOPY (EGD) WITH PROPOFOL;  Surgeon: Manya Silvas, MD;  Location: [facility] ENDOSCOPY;  Service: Endoscopy;  Laterality: N/A;  . EYE SURGERY    . FOOT SURGERY Bilateral   . HAND SURGERY Right   . MASTECTOMY Right 1999   BREAST CA  . PARTIAL COLECTOMY    . THYROIDECTOMY      Family History  Problem Relation Age of Onset  . Dementia Mother   . Dementia Sister   . Diabetes Sister   . Heart attack Brother   . COPD Brother   . Colon cancer  Brother   . Liver cancer Brother   . COPD Brother   . Breast cancer Neg Hx     Social History Social History   Tobacco Use  . Smoking status: Never Smoker  . Smokeless tobacco: Never Used  Substance Use Topics  . Alcohol use: No    Alcohol/week: 0.0 oz  . Drug use: No    Allergies  Allergen Reactions  . Imipramine Tinitus  . Latex Rash  . Penicillins Rash  . Tape Rash    Current Outpatient Medications  Medication Sig Dispense Refill  . atorvastatin (LIPITOR) 40 MG tablet Take 40 mg by mouth at bedtime.    . busPIRone (BUSPAR) 10 MG tablet Take 1 tablet (10 mg total) by mouth daily. 30 tablet 2  . cholecalciferol (VITAMIN D) 1000 units tablet Take  1,000 Units by mouth daily.    . cyanocobalamin (V-R VITAMIN B-12) 500 MCG tablet Take 500 mcg by mouth daily.     . DULoxetine (CYMBALTA) 60 MG capsule Take 1 capsule (60 mg total) by mouth every morning. 90 capsule 1  . hydrochlorothiazide (HYDRODIURIL) 12.5 MG tablet Take 12.5 mg by mouth daily.    . pantoprazole (PROTONIX) 40 MG tablet Take 40 mg by mouth 2 (two) times daily.   11  . SYNTHROID 88 MCG tablet Take 88 mcg by mouth daily.  11  . traZODone (DESYREL) 100 MG tablet Take 1 tablet (100 mg total) by mouth at bedtime as needed for sleep. 90 tablet 1   No current facility-administered medications for this visit.       Review of Systems A complete review of systems was asked and was negative except for the following positive findings heartburn, joint pain, depression  Blood pressure 127/78, pulse 89, temperature 97.7 F (36.5 C), temperature source Oral, height 5\' 6"  (1.676 m), weight 162 lb (73.5 kg), SpO2 99 %.  Physical Exam CONSTITUTIONAL:  Pleasant, well-developed, well-nourished, and in no acute distress. EYES: Pupils equal and reactive to light, Sclera non-icteric EARS, NOSE, MOUTH AND THROAT:  The oropharynx was clear.  Dentition is good repair.  Oral mucosa pink and moist. LYMPH NODES:  Lymph nodes in the neck and axillae were normal RESPIRATORY:  Lungs were clear.  Normal respiratory effort without pathologic use of accessory muscles of respiration CARDIOVASCULAR: Heart was regular without murmurs.  There were no carotid bruits. GI: The abdomen was soft, nontender, and nondistended. There were no palpable masses. There was no hepatosplenomegaly. There were normal bowel sounds in all quadrants. GU:  Rectal deferred.   MUSCULOSKELETAL:  Normal muscle strength and tone.  No clubbing or cyanosis.   SKIN:  There were no pathologic skin lesions.  There were no nodules on palpation. NEUROLOGIC:  Sensation is normal.  Cranial nerves are grossly intact. PSYCH:  Oriented to  person, place and time.  Mood and affect are normal.  Data Reviewed CT scans  I have personally reviewed the patient's imaging, laboratory findings and medical records.    Assessment    I have independently reviewed the patient's history and her CT scans.  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  I explained to the patient and her daughter and granddaughter the indications and risks of thoracoscopy possible thoracotomy with biopsy.  They understand that this is only for diagnostic purposes only.     Plan    After extensive discussion with the patient and her family they would like  to proceed as soon as possible.  We will go ahead and obtain her preoperative studies today.  If possible we will try to get this scheduled as soon as we can.  The indications and risks of surgery were discussed.  They include bleeding, infection, air leak and death.  I also discussed with her the possibility of performing a thoracotomy if we are unable to obtain the diagnosis thoracoscopically.

## 2017-09-07 NOTE — Patient Instructions (Addendum)
Your procedure is scheduled on: Monday 09/10/17 Report to [place]. At   [phone]  Remember: Instructions that are not followed completely may result in serious medical risk, up to and including death, or upon the discretion of your surgeon and anesthesiologist your surgery may need to be rescheduled.     _X__ 1. Do not eat food after midnight the night before your procedure.                 No gum chewing or hard candies. You may drink clear liquids up to 2 hours                 before you are scheduled to arrive for your surgery- DO not drink clear                 liquids within 2 hours of the start of your surgery.                 Clear Liquids include:  water, apple juice without pulp, clear carbohydrate                 drink such as Clearfast or Gatorade, Black Coffee or Tea (Do not add                 anything to coffee or tea).  __X__2.  On the morning of surgery brush your teeth with toothpaste and water, you                 may rinse your mouth with mouthwash if you wish.  Do not swallow any              toothpaste of mouthwash.     _X__ 3.  No Alcohol for 24 hours before or after surgery.   _X__ 4.  Do Not Smoke or use e-cigarettes For 24 Hours Prior to Your Surgery.                 Do not use any chewable tobacco products for at least 6 hours prior to                 surgery.  ____  5.  Bring all medications with you on the day of surgery if instructed.   __X__  6.  Notify your doctor if there is any change in your medical condition      (cold, fever, infections).     Do not wear jewelry, make-up, hairpins, clips or nail polish. Do not wear lotions, powders, or perfumes.  Do not shave 48 hours prior to surgery. Men may shave face and neck. Do not bring valuables to the hospital.    [facility] is not responsible for any belongings or valuables.  Contacts, dentures/partials or body piercings may not be worn into  surgery. Bring a case for your contacts, glasses or hearing aids, a denture cup will be supplied. Leave your suitcase in the car. After surgery it may be brought to your room. For patients admitted to the hospital, discharge time is determined by your treatment team.   Patients discharged the day of surgery will not be allowed to drive home.   Please read over the following fact sheets that you were given:   MRSA Information  __X__ Take these medicines the morning of surgery with A SIP OF WATER:    1. DULOXETINE-CYMBALTA  2. PANTOPRAZOLE-PROTONIX  3. SYNTHROID  4. MAY TAKE  VALIUM IF NEEDED  5.  6.  ____ Fleet Enema (as directed)   __X__ Use CHG Soap/SAGE wipes as directed  ____ Use inhalers on the day of surgery  ____ Stop metformin/Janumet/Farxiga 2 days prior to surgery    ____ Take 1/2 of usual insulin dose the night before surgery. No insulin the morning          of surgery.   ____ Stop Blood Thinners Coumadin/Plavix/Xarelto/Pleta/Pradaxa/Eliquis/Effient/Aspirin  on   Or contact your Surgeon, Cardiologist or Medical Doctor regarding  ability to stop your blood thinners  __X__ Stop Anti-inflammatories 7 days before surgery such as Advil, Ibuprofen, Motrin,  BC or Goodies Powder, Naprosyn, Naproxen, Aleve, Aspirin OK TO TAKE TYLENOL   __X__ Stop all herbal supplements, fish oil or vitamin E until after surgery.    __X__ Bring C-Pap to the hospital.

## 2017-09-07 NOTE — Progress Notes (Signed)
Patient ID: Breanna Zhang, female   DOB: Jul 28, 1940, 77 y.o.   MRN: [id number]  No chief complaint on file.   Referred By Dr. Janese Banks Reason for Referral left pleural-based mass  HPI Location, Quality, Duration, Severity, Timing, Context, Modifying Factors, Associated Signs and Symptoms.  Breanna Zhang is a 77 y.o. female.  Her problems began several months ago when she began experiencing severe headaches as well as some neck pain.  A complete work-up for that failed to reveal any specific etiology but she did undergo injections into her spine which helped considerably.  Then she developed some pain in her back and down her left leg.  She was then seen where a CT scan was performed.  This was of the thoracic and lumbar spine area.  This revealed a left retrocrural lymph node and ultimately a biopsy of that was consistent with carcinoma but insufficient material was available for complete evaluation.  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  A PET scan showed some low-level uptake in these areas again consistent with metastatic disease.  I have been asked to see the patient for consideration of biopsy of 1 of these lesions.  The patient does relate to me that she has had some chest pain which she describes is on her side and over the last several weeks.  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  She gets short of breath with moderate exertion.  She also has a hiatal hernia.  She has had prior history of mastectomy many years ago.  She also has a history of diverticulitis having undergone colon resection for that.   Past Medical History:  Diagnosis Date  . Anxiety   . Atrial fibrillation ([place])   . Breast cancer ([place]) 1999   RT MASTECTOMY  . DDD (degenerative disc disease), cervical   . Depression   . Diverticulosis   . Diverticulosis   . DVT of lower extremity (deep venous thrombosis) ([place])   . GERD  (gastroesophageal reflux disease)   . Goiter   . History of chemotherapy 2000   BREAST CA  . Hypertension   . OSA (obstructive sleep apnea)   . Osteoporosis   . Personal history of chemotherapy 1999   BREAST CA  . Status post chemotherapy    2000 right breast cancer  . Thyroid disease   . UTI (urinary tract infection)   . Vertigo     Past Surgical History:  Procedure Laterality Date  . ABDOMINAL HYSTERECTOMY    . BREAST BIOPSY Left 2006   negative  . CHOLECYSTECTOMY    . COCHLEAR IMPLANT Right   . COLONOSCOPY WITH PROPOFOL N/A 07/24/2016   Procedure: COLONOSCOPY WITH PROPOFOL;  Surgeon: Manya Silvas, MD;  Location: [facility] ENDOSCOPY;  Service: Endoscopy;  Laterality: N/A;  . ESOPHAGOGASTRODUODENOSCOPY (EGD) WITH PROPOFOL N/A 04/12/2016   Procedure: ESOPHAGOGASTRODUODENOSCOPY (EGD) WITH PROPOFOL;  Surgeon: Manya Silvas, MD;  Location: [facility] ENDOSCOPY;  Service: Endoscopy;  Laterality: N/A;  . EYE SURGERY    . FOOT SURGERY Bilateral   . HAND SURGERY Right   . MASTECTOMY Right 1999   BREAST CA  . PARTIAL COLECTOMY    . THYROIDECTOMY      Family History  Problem Relation Age of Onset  . Dementia Mother   . Dementia Sister   . Diabetes Sister   . Heart attack Brother   . COPD Brother   . Colon cancer  Brother   . Liver cancer Brother   . COPD Brother   . Breast cancer Neg Hx     Social History Social History   Tobacco Use  . Smoking status: Never Smoker  . Smokeless tobacco: Never Used  Substance Use Topics  . Alcohol use: No    Alcohol/week: 0.0 oz  . Drug use: No    Allergies  Allergen Reactions  . Imipramine Tinitus  . Latex Rash  . Penicillins Rash  . Tape Rash    Current Outpatient Medications  Medication Sig Dispense Refill  . atorvastatin (LIPITOR) 40 MG tablet Take 40 mg by mouth at bedtime.    . busPIRone (BUSPAR) 10 MG tablet Take 1 tablet (10 mg total) by mouth daily. 30 tablet 2  . cholecalciferol (VITAMIN D) 1000 units tablet Take  1,000 Units by mouth daily.    . cyanocobalamin (V-R VITAMIN B-12) 500 MCG tablet Take 500 mcg by mouth daily.     . DULoxetine (CYMBALTA) 60 MG capsule Take 1 capsule (60 mg total) by mouth every morning. 90 capsule 1  . hydrochlorothiazide (HYDRODIURIL) 12.5 MG tablet Take 12.5 mg by mouth daily.    . pantoprazole (PROTONIX) 40 MG tablet Take 40 mg by mouth 2 (two) times daily.   11  . SYNTHROID 88 MCG tablet Take 88 mcg by mouth daily.  11  . traZODone (DESYREL) 100 MG tablet Take 1 tablet (100 mg total) by mouth at bedtime as needed for sleep. 90 tablet 1   No current facility-administered medications for this visit.       Review of Systems A complete review of systems was asked and was negative except for the following positive findings heartburn, joint pain, depression  Blood pressure 127/78, pulse 89, temperature 97.7 F (36.5 C), temperature source Oral, height 5\' 6"  (1.676 m), weight 162 lb (73.5 kg), SpO2 99 %.  Physical Exam CONSTITUTIONAL:  Pleasant, well-developed, well-nourished, and in no acute distress. EYES: Pupils equal and reactive to light, Sclera non-icteric EARS, NOSE, MOUTH AND THROAT:  The oropharynx was clear.  Dentition is good repair.  Oral mucosa pink and moist. LYMPH NODES:  Lymph nodes in the neck and axillae were normal RESPIRATORY:  Lungs were clear.  Normal respiratory effort without pathologic use of accessory muscles of respiration CARDIOVASCULAR: Heart was regular without murmurs.  There were no carotid bruits. GI: The abdomen was soft, nontender, and nondistended. There were no palpable masses. There was no hepatosplenomegaly. There were normal bowel sounds in all quadrants. GU:  Rectal deferred.   MUSCULOSKELETAL:  Normal muscle strength and tone.  No clubbing or cyanosis.   SKIN:  There were no pathologic skin lesions.  There were no nodules on palpation. NEUROLOGIC:  Sensation is normal.  Cranial nerves are grossly intact. PSYCH:  Oriented to  person, place and time.  Mood and affect are normal.  Data Reviewed CT scans  I have personally reviewed the patient's imaging, laboratory findings and medical records.    Assessment    I have independently reviewed the patient's history and her CT scans.  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  I explained to the patient and her daughter and granddaughter the indications and risks of thoracoscopy possible thoracotomy with biopsy.  They understand that this is only for diagnostic purposes only.     Plan    After extensive discussion with the patient and her family they would like  to proceed as soon as possible.  We will go ahead and obtain her preoperative studies today.  If possible we will try to get this scheduled as soon as we can.  The indications and risks of surgery were discussed.  They include bleeding, infection, air leak and death.  I also discussed with her the possibility of performing a thoracotomy if we are unable to obtain the diagnosis thoracoscopically.

## 2017-09-09 ENCOUNTER — Encounter: Payer: Self-pay | Admitting: Anesthesiology

## 2017-09-10 ENCOUNTER — Inpatient Hospital Stay
Admission: RE | Admit: 2017-09-10 | Discharge: 2017-09-12 | DRG: 168 | Disposition: A | Discharge status: Home / Self Care | Payer: PPO | Attending: Cardiothoracic Surgery | Admitting: Cardiothoracic Surgery

## 2017-09-10 ENCOUNTER — Inpatient Hospital Stay: Payer: PPO

## 2017-09-10 ENCOUNTER — Inpatient Hospital Stay: Payer: PPO | Admitting: Anesthesiology

## 2017-09-10 ENCOUNTER — Other Ambulatory Visit: Payer: Self-pay

## 2017-09-10 ENCOUNTER — Encounter: Payer: Self-pay | Admitting: *Deleted

## 2017-09-10 ENCOUNTER — Encounter
Admission: RE | Disposition: A | Discharge status: Home / Self Care | Payer: Self-pay | Source: Home / Self Care | Attending: Cardiothoracic Surgery

## 2017-09-10 DIAGNOSIS — I10 Essential (primary) hypertension: Secondary | ICD-10-CM | POA: Diagnosis present

## 2017-09-10 DIAGNOSIS — Z8249 Family history of ischemic heart disease and other diseases of the circulatory system: Secondary | ICD-10-CM

## 2017-09-10 DIAGNOSIS — Z9221 Personal history of antineoplastic chemotherapy: Secondary | ICD-10-CM | POA: Diagnosis not present

## 2017-09-10 DIAGNOSIS — Z90710 Acquired absence of both cervix and uterus: Secondary | ICD-10-CM

## 2017-09-10 DIAGNOSIS — Z825 Family history of asthma and other chronic lower respiratory diseases: Secondary | ICD-10-CM | POA: Diagnosis not present

## 2017-09-10 DIAGNOSIS — Z91048 Other nonmedicinal substance allergy status: Secondary | ICD-10-CM

## 2017-09-10 DIAGNOSIS — Z8719 Personal history of other diseases of the digestive system: Secondary | ICD-10-CM | POA: Diagnosis not present

## 2017-09-10 DIAGNOSIS — R918 Other nonspecific abnormal finding of lung field: Secondary | ICD-10-CM

## 2017-09-10 DIAGNOSIS — Z86718 Personal history of other venous thrombosis and embolism: Secondary | ICD-10-CM | POA: Diagnosis not present

## 2017-09-10 DIAGNOSIS — C7802 Secondary malignant neoplasm of left lung: Principal | ICD-10-CM | POA: Diagnosis present

## 2017-09-10 DIAGNOSIS — Z880 Allergy status to penicillin: Secondary | ICD-10-CM

## 2017-09-10 DIAGNOSIS — Z833 Family history of diabetes mellitus: Secondary | ICD-10-CM | POA: Diagnosis not present

## 2017-09-10 DIAGNOSIS — C3492 Malignant neoplasm of unspecified part of left bronchus or lung: Secondary | ICD-10-CM

## 2017-09-10 DIAGNOSIS — E876 Hypokalemia: Secondary | ICD-10-CM | POA: Diagnosis not present

## 2017-09-10 DIAGNOSIS — K449 Diaphragmatic hernia without obstruction or gangrene: Secondary | ICD-10-CM | POA: Diagnosis present

## 2017-09-10 DIAGNOSIS — Z9089 Acquired absence of other organs: Secondary | ICD-10-CM

## 2017-09-10 DIAGNOSIS — F329 Major depressive disorder, single episode, unspecified: Secondary | ICD-10-CM | POA: Diagnosis present

## 2017-09-10 DIAGNOSIS — E890 Postprocedural hypothyroidism: Secondary | ICD-10-CM | POA: Diagnosis present

## 2017-09-10 DIAGNOSIS — Z800 Family history of malignant neoplasm of digestive organs: Secondary | ICD-10-CM | POA: Diagnosis not present

## 2017-09-10 DIAGNOSIS — K219 Gastro-esophageal reflux disease without esophagitis: Secondary | ICD-10-CM | POA: Diagnosis present

## 2017-09-10 DIAGNOSIS — Z818 Family history of other mental and behavioral disorders: Secondary | ICD-10-CM

## 2017-09-10 DIAGNOSIS — I4891 Unspecified atrial fibrillation: Secondary | ICD-10-CM | POA: Diagnosis present

## 2017-09-10 DIAGNOSIS — Z79899 Other long term (current) drug therapy: Secondary | ICD-10-CM

## 2017-09-10 DIAGNOSIS — G4733 Obstructive sleep apnea (adult) (pediatric): Secondary | ICD-10-CM | POA: Diagnosis present

## 2017-09-10 DIAGNOSIS — Z853 Personal history of malignant neoplasm of breast: Secondary | ICD-10-CM

## 2017-09-10 DIAGNOSIS — Z9049 Acquired absence of other specified parts of digestive tract: Secondary | ICD-10-CM

## 2017-09-10 DIAGNOSIS — Z09 Encounter for follow-up examination after completed treatment for conditions other than malignant neoplasm: Secondary | ICD-10-CM

## 2017-09-10 DIAGNOSIS — Z9011 Acquired absence of right breast and nipple: Secondary | ICD-10-CM

## 2017-09-10 DIAGNOSIS — M810 Age-related osteoporosis without current pathological fracture: Secondary | ICD-10-CM | POA: Diagnosis present

## 2017-09-10 DIAGNOSIS — Z91040 Latex allergy status: Secondary | ICD-10-CM

## 2017-09-10 DIAGNOSIS — F419 Anxiety disorder, unspecified: Secondary | ICD-10-CM | POA: Diagnosis present

## 2017-09-10 DIAGNOSIS — Z9621 Cochlear implant status: Secondary | ICD-10-CM | POA: Diagnosis present

## 2017-09-10 DIAGNOSIS — Z79890 Hormone replacement therapy (postmenopausal): Secondary | ICD-10-CM

## 2017-09-10 DIAGNOSIS — Z888 Allergy status to other drugs, medicaments and biological substances status: Secondary | ICD-10-CM

## 2017-09-10 DIAGNOSIS — C3490 Malignant neoplasm of unspecified part of unspecified bronchus or lung: Secondary | ICD-10-CM | POA: Diagnosis present

## 2017-09-10 HISTORY — PX: VIDEO ASSISTED THORACOSCOPY: SHX5073

## 2017-09-10 HISTORY — PX: VIDEO BRONCHOSCOPY: SHX5072

## 2017-09-10 SURGERY — VIDEO ASSISTED THORACOSCOPY
Anesthesia: General | Laterality: Left | Wound class: Clean Contaminated

## 2017-09-10 MED ORDER — HYDROMORPHONE HCL 1 MG/ML IJ SOLN
INTRAMUSCULAR | Status: AC
  Administered 2017-09-10: 0.25 mg via INTRAVENOUS
  Filled 2017-09-10: qty 1

## 2017-09-10 MED ORDER — PROPOFOL 10 MG/ML IV BOLUS
INTRAVENOUS | Status: DC | PRN
  Administered 2017-09-10: 150 mg via INTRAVENOUS

## 2017-09-10 MED ORDER — ONDANSETRON HCL 4 MG/2ML IJ SOLN
INTRAMUSCULAR | Status: AC
  Filled 2017-09-10: qty 2

## 2017-09-10 MED ORDER — VANCOMYCIN HCL 500 MG IV SOLR
500.0000 mg | Freq: Two times a day (BID) | INTRAVENOUS | Status: AC
  Administered 2017-09-10: 500 mg via INTRAVENOUS
  Filled 2017-09-10: qty 500

## 2017-09-10 MED ORDER — ALBUTEROL SULFATE (2.5 MG/3ML) 0.083% IN NEBU
2.5000 mg | INHALATION_SOLUTION | RESPIRATORY_TRACT | Status: DC
  Administered 2017-09-10 – 2017-09-12 (×9): 2.5 mg via RESPIRATORY_TRACT
  Filled 2017-09-10 (×9): qty 3

## 2017-09-10 MED ORDER — BISACODYL 5 MG PO TBEC
10.0000 mg | DELAYED_RELEASE_TABLET | Freq: Every day | ORAL | Status: DC
  Administered 2017-09-11 – 2017-09-12 (×2): 10 mg via ORAL
  Filled 2017-09-10 (×2): qty 2

## 2017-09-10 MED ORDER — LIDOCAINE HCL (CARDIAC) PF 100 MG/5ML IV SOSY
PREFILLED_SYRINGE | INTRAVENOUS | Status: DC | PRN
  Administered 2017-09-10: 80 mg via INTRAVENOUS

## 2017-09-10 MED ORDER — ATORVASTATIN CALCIUM 20 MG PO TABS
40.0000 mg | ORAL_TABLET | Freq: Every day | ORAL | Status: DC
  Administered 2017-09-10 – 2017-09-11 (×2): 40 mg via ORAL
  Filled 2017-09-10 (×2): qty 2

## 2017-09-10 MED ORDER — SUCCINYLCHOLINE CHLORIDE 20 MG/ML IJ SOLN
INTRAMUSCULAR | Status: DC | PRN
  Administered 2017-09-10: 140 mg via INTRAVENOUS

## 2017-09-10 MED ORDER — HYDROMORPHONE HCL 1 MG/ML IJ SOLN
0.2500 mg | INTRAMUSCULAR | Status: DC | PRN
  Administered 2017-09-10 (×4): 0.25 mg via INTRAVENOUS

## 2017-09-10 MED ORDER — ROCURONIUM BROMIDE 50 MG/5ML IV SOLN
INTRAVENOUS | Status: AC
  Filled 2017-09-10: qty 1

## 2017-09-10 MED ORDER — TRAZODONE HCL 100 MG PO TABS
100.0000 mg | ORAL_TABLET | Freq: Every evening | ORAL | Status: DC | PRN
  Administered 2017-09-10 – 2017-09-11 (×2): 100 mg via ORAL
  Filled 2017-09-10 (×2): qty 1

## 2017-09-10 MED ORDER — ONDANSETRON HCL 4 MG/2ML IJ SOLN: 4.0000 mg | Freq: Once | INTRAMUSCULAR | Status: DC | PRN

## 2017-09-10 MED ORDER — PHENYLEPHRINE HCL 10 MG/ML IJ SOLN
INTRAMUSCULAR | Status: DC | PRN
  Administered 2017-09-10: 100 ug via INTRAVENOUS

## 2017-09-10 MED ORDER — VANCOMYCIN HCL IN DEXTROSE 1-5 GM/200ML-% IV SOLN
INTRAVENOUS | Status: AC
  Filled 2017-09-10: qty 200

## 2017-09-10 MED ORDER — LEVOTHYROXINE SODIUM 88 MCG PO TABS
88.0000 ug | ORAL_TABLET | ORAL | Status: DC
  Administered 2017-09-11 – 2017-09-12 (×2): 88 ug via ORAL
  Filled 2017-09-10 (×2): qty 1

## 2017-09-10 MED ORDER — BUPIVACAINE HCL 0.25 % IJ SOLN
INTRAMUSCULAR | Status: DC | PRN
  Administered 2017-09-10: 20 mL

## 2017-09-10 MED ORDER — FENTANYL CITRATE (PF) 100 MCG/2ML IJ SOLN
INTRAMUSCULAR | Status: DC | PRN
  Administered 2017-09-10 (×3): 50 ug via INTRAVENOUS

## 2017-09-10 MED ORDER — ROCURONIUM BROMIDE 100 MG/10ML IV SOLN
INTRAVENOUS | Status: DC | PRN
  Administered 2017-09-10: 5 mg via INTRAVENOUS
  Administered 2017-09-10: 20 mg via INTRAVENOUS
  Administered 2017-09-10: 25 mg via INTRAVENOUS

## 2017-09-10 MED ORDER — ACETAMINOPHEN 10 MG/ML IV SOLN
INTRAVENOUS | Status: AC
  Filled 2017-09-10: qty 100

## 2017-09-10 MED ORDER — VANCOMYCIN HCL IN DEXTROSE 1-5 GM/200ML-% IV SOLN
1000.0000 mg | INTRAVENOUS | Status: AC
  Administered 2017-09-10: 1000 mg via INTRAVENOUS

## 2017-09-10 MED ORDER — FENTANYL CITRATE (PF) 100 MCG/2ML IJ SOLN
INTRAMUSCULAR | Status: AC
  Administered 2017-09-10: 25 ug via INTRAVENOUS
  Filled 2017-09-10: qty 2

## 2017-09-10 MED ORDER — FENTANYL CITRATE (PF) 100 MCG/2ML IJ SOLN
INTRAMUSCULAR | Status: AC
  Filled 2017-09-10: qty 2

## 2017-09-10 MED ORDER — DULOXETINE HCL 30 MG PO CPEP
60.0000 mg | ORAL_CAPSULE | Freq: Every morning | ORAL | Status: DC
  Administered 2017-09-10 – 2017-09-12 (×3): 60 mg via ORAL
  Filled 2017-09-10 (×3): qty 2

## 2017-09-10 MED ORDER — CHLORHEXIDINE GLUCONATE CLOTH 2 % EX PADS
6.0000 | MEDICATED_PAD | Freq: Once | CUTANEOUS | Status: AC
  Administered 2017-09-10: 6 via TOPICAL

## 2017-09-10 MED ORDER — SUGAMMADEX SODIUM 200 MG/2ML IV SOLN
INTRAVENOUS | Status: DC | PRN
  Administered 2017-09-10: 500 mg via INTRAVENOUS

## 2017-09-10 MED ORDER — CHLORHEXIDINE GLUCONATE CLOTH 2 % EX PADS: 6.0000 | MEDICATED_PAD | Freq: Once | CUTANEOUS | Status: DC

## 2017-09-10 MED ORDER — FENTANYL CITRATE (PF) 100 MCG/2ML IJ SOLN
25.0000 ug | INTRAMUSCULAR | Status: DC | PRN
  Administered 2017-09-10 (×4): 25 ug via INTRAVENOUS

## 2017-09-10 MED ORDER — LACTATED RINGERS IV SOLN
INTRAVENOUS | Status: DC
  Administered 2017-09-10: 08:00:00 via INTRAVENOUS

## 2017-09-10 MED ORDER — ACETAMINOPHEN 10 MG/ML IV SOLN
INTRAVENOUS | Status: DC | PRN
  Administered 2017-09-10: 1000 mg via INTRAVENOUS

## 2017-09-10 MED ORDER — ONDANSETRON HCL 4 MG/2ML IJ SOLN
4.0000 mg | Freq: Four times a day (QID) | INTRAMUSCULAR | Status: DC | PRN
  Administered 2017-09-10 – 2017-09-12 (×2): 4 mg via INTRAVENOUS
  Filled 2017-09-10 (×2): qty 2

## 2017-09-10 MED ORDER — ONDANSETRON HCL 4 MG/2ML IJ SOLN
INTRAMUSCULAR | Status: DC | PRN
  Administered 2017-09-10: 4 mg via INTRAVENOUS

## 2017-09-10 MED ORDER — DEXAMETHASONE SODIUM PHOSPHATE 10 MG/ML IJ SOLN
INTRAMUSCULAR | Status: DC | PRN
  Administered 2017-09-10: 10 mg via INTRAVENOUS

## 2017-09-10 MED ORDER — BUPIVACAINE HCL (PF) 0.25 % IJ SOLN
INTRAMUSCULAR | Status: AC
  Filled 2017-09-10: qty 30

## 2017-09-10 MED ORDER — OXYCODONE-ACETAMINOPHEN 5-325 MG PO TABS
1.0000 | ORAL_TABLET | ORAL | Status: DC | PRN
  Administered 2017-09-10: 2 via ORAL
  Administered 2017-09-10: 1 via ORAL
  Administered 2017-09-10: 2 via ORAL
  Administered 2017-09-11: 1 via ORAL
  Administered 2017-09-11: 2 via ORAL
  Administered 2017-09-11: 1 via ORAL
  Administered 2017-09-11 – 2017-09-12 (×3): 2 via ORAL
  Filled 2017-09-10 (×2): qty 2
  Filled 2017-09-10: qty 1
  Filled 2017-09-10 (×5): qty 2
  Filled 2017-09-10: qty 1

## 2017-09-10 MED ORDER — PANTOPRAZOLE SODIUM 40 MG PO TBEC
40.0000 mg | DELAYED_RELEASE_TABLET | Freq: Two times a day (BID) | ORAL | Status: DC
  Administered 2017-09-10 – 2017-09-12 (×4): 40 mg via ORAL
  Filled 2017-09-10 (×4): qty 1

## 2017-09-10 MED ORDER — KCL IN DEXTROSE-NACL 20-5-0.45 MEQ/L-%-% IV SOLN
INTRAVENOUS | Status: DC
  Administered 2017-09-10 – 2017-09-11 (×2): via INTRAVENOUS
  Filled 2017-09-10 (×3): qty 1000

## 2017-09-10 MED ORDER — SUGAMMADEX SODIUM 500 MG/5ML IV SOLN
INTRAVENOUS | Status: AC
  Filled 2017-09-10: qty 5

## 2017-09-10 MED ORDER — DEXAMETHASONE SODIUM PHOSPHATE 10 MG/ML IJ SOLN
INTRAMUSCULAR | Status: AC
  Filled 2017-09-10: qty 1

## 2017-09-10 MED ORDER — HYDROCHLOROTHIAZIDE 25 MG PO TABS
12.5000 mg | ORAL_TABLET | Freq: Every day | ORAL | Status: DC
  Administered 2017-09-10: 12.5 mg via ORAL
  Filled 2017-09-10: qty 1

## 2017-09-10 SURGICAL SUPPLY — 71 items
BENZOIN TINCTURE PRP APPL 2/3 (GAUZE/BANDAGES/DRESSINGS) IMPLANT
BNDG COHESIVE 4X5 TAN STRL (GAUZE/BANDAGES/DRESSINGS) IMPLANT
BRONCHOSCOPE PED SLIM DISP (MISCELLANEOUS) ×3 IMPLANT
CANISTER SUCT 1200ML W/VALVE (MISCELLANEOUS) ×3 IMPLANT
CATH URET ROBINSON 16FR STRL (CATHETERS) IMPLANT
CHLORAPREP W/TINT 26ML (MISCELLANEOUS) ×6 IMPLANT
CNTNR SPEC 2.5X3XGRAD LEK (MISCELLANEOUS) ×2
CONT SPEC 4OZ STER OR WHT (MISCELLANEOUS) ×1
CONTAINER SPEC 2.5X3XGRAD LEK (MISCELLANEOUS) ×2 IMPLANT
CUTTER ECHEON FLEX ENDO 45 340 (ENDOMECHANICALS) IMPLANT
DEFOGGER SCOPE WARMER CLEARIFY (MISCELLANEOUS) ×3 IMPLANT
DRAIN CHEST DRY SUCT SGL (MISCELLANEOUS) ×3 IMPLANT
DRAPE C-SECTION (MISCELLANEOUS) ×3 IMPLANT
DRAPE MAG INST 16X20 L/F (DRAPES) IMPLANT
DRAPE POUCH INSTRU U-SHP 10X18 (DRAPES) IMPLANT
DRSG OPSITE POSTOP 3X4 (GAUZE/BANDAGES/DRESSINGS) IMPLANT
DRSG OPSITE POSTOP 4X6 (GAUZE/BANDAGES/DRESSINGS) IMPLANT
DRSG OPSITE POSTOP 4X8 (GAUZE/BANDAGES/DRESSINGS) IMPLANT
DRSG TELFA 3X8 NADH (GAUZE/BANDAGES/DRESSINGS) ×3 IMPLANT
ELECT BLADE 6 FLAT ULTRCLN (ELECTRODE) IMPLANT
ELECT BLADE 6.5 EXT (BLADE) IMPLANT
ELECT CAUTERY BLADE TIP 2.5 (TIP)
ELECT REM PT RETURN 9FT ADLT (ELECTROSURGICAL) ×3
ELECTRODE CAUTERY BLDE TIP 2.5 (TIP) IMPLANT
ELECTRODE REM PT RTRN 9FT ADLT (ELECTROSURGICAL) ×2 IMPLANT
GAUZE SPONGE 4X4 12PLY STRL (GAUZE/BANDAGES/DRESSINGS) ×3 IMPLANT
GLOVE SURG SYN 7.5  E (GLOVE) ×3
GLOVE SURG SYN 7.5 E (GLOVE) ×6 IMPLANT
GOWN STRL REUS W/ TWL LRG LVL3 (GOWN DISPOSABLE) ×6 IMPLANT
GOWN STRL REUS W/TWL LRG LVL3 (GOWN DISPOSABLE) ×3
KIT TURNOVER KIT A (KITS) ×3 IMPLANT
LABEL OR SOLS (LABEL) ×3 IMPLANT
LOOP RED MAXI  1X406MM (MISCELLANEOUS)
LOOP VESSEL MAXI 1X406 RED (MISCELLANEOUS) IMPLANT
MARKER SKIN DUAL TIP RULER LAB (MISCELLANEOUS) ×3 IMPLANT
NEEDLE HYPO 22GX1.5 SAFETY (NEEDLE) ×3 IMPLANT
NEEDLE SPNL 20GX3.5 QUINCKE YW (NEEDLE) IMPLANT
NS IRRIG 500ML POUR BTL (IV SOLUTION) ×3 IMPLANT
PACK BASIN MAJOR ARMC (MISCELLANEOUS) ×3 IMPLANT
RELOAD STAPLER LINE PROX 30 GR (STAPLE) IMPLANT
SCISSORS METZENBAUM CVD 33 (INSTRUMENTS) IMPLANT
SOL ANTI-FOG 6CC FOG-OUT (MISCELLANEOUS) ×2 IMPLANT
SOL FOG-OUT ANTI-FOG 6CC (MISCELLANEOUS) ×1
SPONGE KITTNER 5P (MISCELLANEOUS) ×3 IMPLANT
STAPLER RELOAD LINE PROX 30 GR (STAPLE)
STAPLER SKIN PROX 35W (STAPLE) IMPLANT
STAPLER VASCULAR ECHELON 35 (CUTTER) IMPLANT
STRIP CLOSURE SKIN 1/2X4 (GAUZE/BANDAGES/DRESSINGS) IMPLANT
SUT ETHILON 4-0 (SUTURE) ×2
SUT ETHILON 4-0 FS2 18XMFL BLK (SUTURE) ×4
SUT MNCRL AB 3-0 PS2 27 (SUTURE) IMPLANT
SUT PROLENE 5 0 RB 1 DA (SUTURE) IMPLANT
SUT SILK 0 (SUTURE)
SUT SILK 0 30XBRD TIE 6 (SUTURE) IMPLANT
SUT SILK 1 SH (SUTURE) ×6 IMPLANT
SUT VIC AB 0 CT1 36 (SUTURE) ×6 IMPLANT
SUT VIC AB 2-0 CT1 27 (SUTURE) ×2
SUT VIC AB 2-0 CT1 TAPERPNT 27 (SUTURE) ×4 IMPLANT
SUT VIC AB 2-0 CT2 27 (SUTURE) IMPLANT
SUT VICRYL 2 TP 1 (SUTURE) IMPLANT
SUTURE ETHLN 4-0 FS2 18XMF BLK (SUTURE) ×4 IMPLANT
SYR 10ML SLIP (SYRINGE) ×3 IMPLANT
SYR BULB IRRIG 60ML STRL (SYRINGE) ×3 IMPLANT
TAPE ADH 3 LX (MISCELLANEOUS) ×3 IMPLANT
TAPE TRANSPORE STRL 2 31045 (GAUZE/BANDAGES/DRESSINGS) IMPLANT
TRAY FOLEY MTR SLVR 16FR STAT (SET/KITS/TRAYS/PACK) IMPLANT
TROCAR FLEXIPATH 20X80 (ENDOMECHANICALS) ×3 IMPLANT
TROCAR FLEXIPATH THORACIC 15MM (ENDOMECHANICALS) IMPLANT
TUBING CONNECTING 10 (TUBING) IMPLANT
WATER STERILE IRR 1000ML POUR (IV SOLUTION) ×3 IMPLANT
YANKAUER SUCT BULB TIP FLEX NO (MISCELLANEOUS) ×3 IMPLANT

## 2017-09-10 NOTE — Anesthesia Postprocedure Evaluation (Signed)
Anesthesia Post Note  Patient: Breanna Zhang  Procedure(s) Performed: VIDEO ASSISTED THORACOSCOPY (Left ) VIDEO BRONCHOSCOPY  Patient location during evaluation: PACU Anesthesia Type: General Level of consciousness: awake and alert Pain management: pain level controlled Vital Signs Assessment: post-procedure vital signs reviewed and stable Respiratory status: spontaneous breathing, nonlabored ventilation, respiratory function stable and patient connected to nasal cannula oxygen Cardiovascular status: blood pressure returned to baseline and stable Postop Assessment: no apparent nausea or vomiting Anesthetic complications: no     Last Vitals:  Vitals:   09/10/17 1237 09/10/17 1255  BP: (!) 110/58 (!) 113/53  Pulse: 85 87  Resp: 18 (!) 24  Temp: 37 C 36.5 C  SpO2: 95% 95%    Last Pain:  Vitals:   09/10/17 1255  TempSrc: Oral  PainSc:                  Anila Bojarski S

## 2017-09-10 NOTE — Anesthesia Preprocedure Evaluation (Addendum)
Anesthesia Evaluation  Patient identified by MRN, date of birth, ID band Patient awake    Reviewed: Allergy & Precautions, NPO status , Patient's Chart, lab work & pertinent test results, reviewed documented beta blocker date and time   Airway Mallampati: II  TM Distance: >3 FB     Dental  (+) Upper Dentures, Lower Dentures   Pulmonary shortness of breath, sleep apnea ,           Cardiovascular hypertension, Pt. on medications + Peripheral Vascular Disease       Neuro/Psych  Headaches, PSYCHIATRIC DISORDERS Anxiety Depression  Neuromuscular disease    GI/Hepatic GERD  Controlled,  Endo/Other  Hypothyroidism   Renal/GU      Musculoskeletal  (+) Arthritis ,   Abdominal   Peds  Hematology   Anesthesia Other Findings K 3.3. EKG ok. Deaf. No cardiac complaints.  Reproductive/Obstetrics                           Anesthesia Physical Anesthesia Plan  ASA: III  Anesthesia Plan: General   Post-op Pain Management:    Induction: Intravenous  PONV Risk Score and Plan:   Airway Management Planned: Double Lumen EBT  Additional Equipment:   Intra-op Plan:   Post-operative Plan:   Informed Consent: I have reviewed the patients History and Physical, chart, labs and discussed the procedure including the risks, benefits and alternatives for the proposed anesthesia with the patient or authorized representative who has indicated his/her understanding and acceptance.     Plan Discussed with: CRNA  Anesthesia Plan Comments:         Anesthesia Quick Evaluation

## 2017-09-10 NOTE — Addendum Note (Signed)
Addendum  created 09/10/17 1324 by Gunnar Bulla, MD   Intraprocedure Event edited

## 2017-09-10 NOTE — Anesthesia Procedure Notes (Signed)
Procedure Name: Intubation Date/Time: 09/10/2017 9:09 AM Performed by: Loleta Books, RN Pre-anesthesia Checklist: Patient identified, Patient being monitored, Timeout performed, Emergency Drugs available and Suction available Patient Re-evaluated:Patient Re-evaluated prior to induction Oxygen Delivery Method: Circle system utilized Preoxygenation: Pre-oxygenation with 100% oxygen Induction Type: IV induction Ventilation: Mask ventilation without difficulty Laryngoscope Size: Mac and 3 Grade View: Grade I Tube type: Oral Endobronchial tube: Double lumen EBT and Left and 35 Fr Number of attempts: 1 Airway Equipment and Method: Stylet Placement Confirmation: ETT inserted through vocal cords under direct vision,  positive ETCO2 and breath sounds checked- equal and bilateral Secured at: 21 cm Tube secured with: Tape Dental Injury: Teeth and Oropharynx as per pre-operative assessment  Comments: Bronchoscopy performed post intubation for placement verification

## 2017-09-10 NOTE — Interval H&P Note (Signed)
History and Physical Interval Note:  09/10/2017 8:06 AM  Breanna Zhang  has presented today for surgery, with the diagnosis of lung mass  The various methods of treatment have been discussed with the patient and family. After consideration of risks, benefits and other options for treatment, the patient has consented to  Procedure(s): VIDEO ASSISTED THORACOSCOPY (Left) THORACOTOMY MAJOR WITH BIOPSY OF PLEURAL MASS (Left) as a surgical intervention .  The patient's history has been reviewed, patient examined, no change in status, stable for surgery.  I have reviewed the patient's chart and labs.  Questions were answered to the patient's satisfaction.     [name]

## 2017-09-10 NOTE — Progress Notes (Signed)
Patient tolerated clear liquid diet without pain, nausea or vomiting. Requested diet be advanced. Advanced diet to full liquid diet. Will continue to monitor.

## 2017-09-10 NOTE — Op Note (Signed)
  09/10/2017  11:13 AM  PATIENT:  Breanna Zhang  77 y.o. female  PRE-OPERATIVE DIAGNOSIS: Carcinoma left pleural space  POST-OPERATIVE DIAGNOSIS: Carcinoma left pleural space  PROCEDURE: Left thoracoscopy with biopsy of left pleural mass  SURGEON:  Surgeon(s) and Role:    [name], MD - Primary  ASSISTANTS: Eloy End PA S  ANESTHESIA: General  INDICATIONS FOR PROCEDURE this patient is a 77 year old woman who recently had a biopsy of a left retrocrural mass.  This was consistent with carcinoma.  Further work-up revealed multiple lesions within the left pleural space all of which were consistent with metastatic disease.  Unfortunately there is insufficient material for definitive diagnosis and she was offered the above named procedure for pleural biopsy and diagnosis of her pleural-based mass.  DICTATION: The patient is brought to the operating suite and placed in the supine position.  General endotracheal anesthesia was given.  Preoperative bronchoscopy was carried out and was performed to position the tube appropriately.  There were no gross airway obstructions.  Patient was then turned for left thoracoscopy.  All pressure points were carefully padded.  The patient was prepped and draped in usual sterile fashion.  We began by making a single 15 mm port at approximately the seventh intercostal space in the mid axillary line.  The incision was deepened down through the muscles of the chest until the pleural space was entered.  A 15 mm port was inserted.  Thoracoscopy was carried out.  There were extensive pleural implants.  These were particularly true along the diaphragm and posteriorly.  However there were implants all throughout the surface of the lung and throughout the entire pleural space.  The lung was adherent posteriorly just above the aorta in this area was left alone.  There were multiple nodules that were representative and these were biopsied using up cupped biopsy  forceps.  These were sent to pathology and frozen section returned carcinoma.  Sufficient material was deemed available for definitive diagnosis.  A 19 Blake drain was then placed in the pleural space and brought out through a separate stab wound inferiorly.  The wounds were then closed with multiple layers running absorbable sutures.  The skin was closed with nylon.  Sterile dressings were applied.  The patient was then rolled in the supine position where she was extubated and taken to the recovery room in stable condition.     [name], MD

## 2017-09-10 NOTE — Transfer of Care (Signed)
Immediate Anesthesia Transfer of Care Note  Patient: Breanna Zhang  Procedure(s) Performed: VIDEO ASSISTED THORACOSCOPY (Left ) VIDEO BRONCHOSCOPY  Patient Location: PACU  Anesthesia Type:General  Level of Consciousness: drowsy  Airway & Oxygen Therapy: Patient Spontanous Breathing and Patient connected to face mask oxygen  Post-op Assessment: Report given to RN and Post -op Vital signs reviewed and stable  Post vital signs: Reviewed and stable  Last Vitals:  Vitals Value Taken Time  BP 101/48 09/10/2017 11:07 AM  Temp 36.2 C 09/10/2017 11:06 AM  Pulse 63 09/10/2017 11:07 AM  Resp 10 09/10/2017 11:07 AM  SpO2 100 % 09/10/2017 11:07 AM  Vitals shown include unvalidated device data.  Last Pain:  Vitals:   09/10/17 1106  TempSrc: Temporal  PainSc:          Complications: No apparent anesthesia complications

## 2017-09-10 NOTE — Anesthesia Post-op Follow-up Note (Signed)
Anesthesia QCDR form completed.        

## 2017-09-11 ENCOUNTER — Inpatient Hospital Stay: Payer: PPO

## 2017-09-11 ENCOUNTER — Encounter: Payer: Self-pay | Admitting: Cardiothoracic Surgery

## 2017-09-11 ENCOUNTER — Telehealth: Payer: Self-pay

## 2017-09-11 MED ORDER — TRAMADOL HCL 50 MG PO TABS
50.0000 mg | ORAL_TABLET | Freq: Four times a day (QID) | ORAL | Status: DC | PRN
  Administered 2017-09-11 – 2017-09-12 (×2): 50 mg via ORAL
  Filled 2017-09-11 (×2): qty 1

## 2017-09-11 NOTE — Telephone Encounter (Signed)
Monica from [employer] is you in reference to patient's prior authorization. Please give her a call when you have a chance.

## 2017-09-11 NOTE — Progress Notes (Signed)
cpap checked out  And functioning properly ,will notify the biomedical enginerring

## 2017-09-11 NOTE — Progress Notes (Signed)
Patient ID: Breanna Zhang, female   DOB: 1940/09/30, 77 y.o.   MRN: [id number]  Sitting in chair eating breakfast. Has some pain but controllable.    No air leak  CT drained 210 cc since insertion  Wounds clean and dry  Will place chest tube to water seal DC IV fluids CXRay today at 1400 hours  [employer].

## 2017-09-12 LAB — BASIC METABOLIC PANEL
Anion gap: 7 (ref 5–15)
BUN: 9 mg/dL (ref 8–23)
CHLORIDE: 103 mmol/L (ref 98–111)
CO2: 27 mmol/L (ref 22–32)
Calcium: 7.6 mg/dL — ABNORMAL LOW (ref 8.9–10.3)
Creatinine, Ser: 0.67 mg/dL (ref 0.44–1.00)
GFR calc Af Amer: >60 mL/min (ref >60)
GLUCOSE: 159 mg/dL — AB (ref 70–99)
POTASSIUM: 3 mmol/L — AB (ref 3.5–5.1)
Sodium: 137 mmol/L (ref 135–145)

## 2017-09-12 LAB — CBC
HCT: 33.4 % — ABNORMAL LOW (ref 35.0–47.0)
HEMOGLOBIN: 11.5 g/dL — AB (ref 12.0–16.0)
MCH: 30.2 pg (ref 26.0–34.0)
MCHC: 34.5 g/dL (ref 32.0–36.0)
MCV: 87.5 fL (ref 80.0–100.0)
PLATELETS: 210 10*3/uL (ref 150–440)
RBC: 3.82 MIL/uL (ref 3.80–5.20)
RDW: 13.4 % (ref 11.5–14.5)
WBC: 9.2 10*3/uL (ref 3.6–11.0)

## 2017-09-12 LAB — URINALYSIS, COMPLETE (UACMP) WITH MICROSCOPIC
Bilirubin Urine: NEGATIVE
GLUCOSE, UA: NEGATIVE mg/dL
Hgb urine dipstick: NEGATIVE
KETONES UR: NEGATIVE mg/dL
LEUKOCYTES UA: NEGATIVE
Nitrite: NEGATIVE
PH: 6 (ref 5.0–8.0)
Protein, ur: NEGATIVE mg/dL
Specific Gravity, Urine: 1.008 (ref 1.005–1.030)

## 2017-09-12 MED ORDER — ALBUTEROL SULFATE (2.5 MG/3ML) 0.083% IN NEBU: 2.5000 mg | INHALATION_SOLUTION | Freq: Two times a day (BID) | RESPIRATORY_TRACT | Status: DC

## 2017-09-12 NOTE — Discharge Summary (Signed)
Physician Discharge Summary  Patient ID: Breanna Zhang MRN: [id number] DOB/AGE: June 24, 1940 77 y.o.  Admit date: 09/10/2017 Discharge date: 09/12/2017   Discharge Diagnoses:  Active Problems:   Lung cancer [facility])   Procedures: Left thoracoscopy with pleural biopsy  Hospital Course: Admitted to the hospital after thoracoscopic pleural biopsy.  Path returned metastatic breast cancer.  Chest tube removed and patient discharged.  Low grade fever but normal WBC.  Urinalysis pending.  Potassium low at 3.0 and patient given KCl 20 meq per day and also we held her hydrodiuril as blood pressure was slightly low.  Will repeat potassium next week when she follows up with Dr. Janese Banks.  I will see her in one week as well.  Prescription given for Percocet 1 to 2 tabs every 4 - 6 hours (20 tablets).    Disposition: Discharge disposition: 01-Home or Self Care       Discharge Instructions    Diet - low sodium heart healthy   Complete by:  As directed    Discharge instructions   Complete by:  As directed    Remove dressing in 48 hours and wash over wounds with soap and water.  Please have Dr. Janese Banks check potassium when you see her next week.  I will need to see you in one week for suture removal.  Please call the office for an appointment.   Increase activity slowly   Complete by:  As directed      Allergies as of 09/12/2017      Reactions   Imipramine Tinitus   Latex Rash   Penicillins Rash   Tape Rash      Medication List    STOP taking these medications   hydrochlorothiazide 12.5 MG tablet Commonly known as:  HYDRODIURIL     TAKE these medications   atorvastatin 40 MG tablet Commonly known as:  LIPITOR Take 40 mg by mouth at bedtime.   busPIRone 10 MG tablet Commonly known as:  BUSPAR Take 1 tablet (10 mg total) by mouth daily.   cholecalciferol 1000 units tablet Commonly known as:  VITAMIN D Take 1,000 Units by mouth daily.   DULoxetine 60 MG capsule Commonly known as:   CYMBALTA Take 1 capsule (60 mg total) by mouth every morning.   pantoprazole 40 MG tablet Commonly known as:  PROTONIX Take 40 mg by mouth 2 (two) times daily.   SYNTHROID 88 MCG tablet Generic drug:  levothyroxine Take 88 mcg by mouth daily.   traZODone 100 MG tablet Commonly known as:  DESYREL Take 1 tablet (100 mg total) by mouth at bedtime as needed for sleep.   V-R VITAMIN B-12 500 MCG tablet Generic drug:  vitamin B-12 Take 500 mcg by mouth daily.   VALIUM 2 MG tablet Generic drug:  diazepam Take 2 mg by mouth every 6 (six) hours as needed for anxiety.        [name], MD

## 2017-09-12 NOTE — Progress Notes (Signed)
  Patient ID: Breanna Zhang, female   DOB: [birth year], 77 y.o.   MRN: [id number]  HISTORY: Still having some pain but better.  No air leak seen.  Minimal chest tube drainage.     Vitals:   09/12/17 0544 09/12/17 0644  BP:    Pulse:    Resp:    Temp: 99.9 F (37.7 C) 99.3 F (37.4 C)  SpO2:       EXAM:    Resp: Lungs are clear bilaterally.  No respiratory distress, normal effort. Heart:  Regular without murmurs Abd:  Abdomen is soft, non distended and non tender. No masses are palpable.  There is no rebound and no guarding.  Neurological: Alert and oriented to person, place, and time. Coordination normal.  Skin: Skin is warm and dry. No rash noted. No diaphoretic. No erythema. No pallor.  Psychiatric: Normal mood and affect. Normal behavior. Judgment and thought content normal.   CXRay from yesterday shows very small left apical pneumothorax  ASSESSMENT: Received call from Dr. Reuel Derby - path consistent with metastatic breast cancer.  Chest tube removed without difficulty.  WBC normal.  Urinalysis still pending but asymptomatic.  Potassium 3.0 - will hold diuretics since BP is low normal and will give KCl at 20 meq per day.   PLAN:   Discharge to home.  Followup with Dr. Janese Banks next week.  Repeat labs next week with Dr. Janese Banks in [place].    Breanna Zhang, MDPatient ID: Breanna Zhang, female   DOB: 12/04/1940, 77 y.o.   MRN: [id number]

## 2017-09-12 NOTE — Progress Notes (Signed)
Discharge teaching given to patient, patient verbalized understanding and had no questions. Patient IV removed. Patient will be transported home by family. All patient belongings gathered prior to leaving.  

## 2017-09-13 LAB — URINE CULTURE: Culture: NO GROWTH

## 2017-09-17 ENCOUNTER — Inpatient Hospital Stay: Payer: PPO | Attending: Oncology | Admitting: Oncology

## 2017-09-17 ENCOUNTER — Telehealth: Payer: Self-pay | Admitting: Pharmacy Technician

## 2017-09-17 ENCOUNTER — Inpatient Hospital Stay: Payer: PPO

## 2017-09-17 ENCOUNTER — Telehealth: Payer: Self-pay | Admitting: Pharmacist

## 2017-09-17 ENCOUNTER — Encounter: Payer: Self-pay | Admitting: Oncology

## 2017-09-17 ENCOUNTER — Ambulatory Visit: Payer: PPO | Admitting: Psychiatry

## 2017-09-17 VITALS — BP 121/75 | HR 87 | Temp 96.6°F | Resp 18 | Ht 66.0 in | Wt 162.9 lb

## 2017-09-17 DIAGNOSIS — C50919 Malignant neoplasm of unspecified site of unspecified female breast: Secondary | ICD-10-CM | POA: Diagnosis not present

## 2017-09-17 DIAGNOSIS — Z9011 Acquired absence of right breast and nipple: Secondary | ICD-10-CM | POA: Insufficient documentation

## 2017-09-17 DIAGNOSIS — Z853 Personal history of malignant neoplasm of breast: Secondary | ICD-10-CM | POA: Diagnosis not present

## 2017-09-17 DIAGNOSIS — Z7189 Other specified counseling: Secondary | ICD-10-CM | POA: Diagnosis not present

## 2017-09-17 DIAGNOSIS — Z9221 Personal history of antineoplastic chemotherapy: Secondary | ICD-10-CM | POA: Insufficient documentation

## 2017-09-17 DIAGNOSIS — J90 Pleural effusion, not elsewhere classified: Secondary | ICD-10-CM | POA: Insufficient documentation

## 2017-09-17 DIAGNOSIS — Z9223 Personal history of estrogen therapy: Secondary | ICD-10-CM | POA: Insufficient documentation

## 2017-09-17 DIAGNOSIS — C782 Secondary malignant neoplasm of pleura: Secondary | ICD-10-CM | POA: Diagnosis not present

## 2017-09-17 LAB — BASIC METABOLIC PANEL
Anion gap: 13 (ref 5–15)
BUN: 7 mg/dL — AB (ref 8–23)
CHLORIDE: 100 mmol/L (ref 98–111)
CO2: 26 mmol/L (ref 22–32)
Calcium: 8.5 mg/dL — ABNORMAL LOW (ref 8.9–10.3)
Creatinine, Ser: 0.75 mg/dL (ref 0.44–1.00)
GFR calc Af Amer: >60 mL/min (ref >60)
GFR calc non Af Amer: >60 mL/min (ref >60)
Glucose, Bld: 109 mg/dL — ABNORMAL HIGH (ref 70–99)
POTASSIUM: 4 mmol/L (ref 3.5–5.1)
Sodium: 139 mmol/L (ref 135–145)

## 2017-09-17 LAB — CBC WITH DIFFERENTIAL/PLATELET
Basophils Absolute: 0.1 10*3/uL (ref 0–0.1)
Basophils Relative: 1 %
EOS PCT: 2 %
Eosinophils Absolute: 0.1 10*3/uL (ref 0–0.7)
HCT: 35.1 % (ref 35.0–47.0)
Hemoglobin: 11.8 g/dL — ABNORMAL LOW (ref 12.0–16.0)
LYMPHS ABS: 1.4 10*3/uL (ref 1.0–3.6)
LYMPHS PCT: 18 %
MCH: 29 pg (ref 26.0–34.0)
MCHC: 33.5 g/dL (ref 32.0–36.0)
MCV: 86.5 fL (ref 80.0–100.0)
MONO ABS: 0.7 10*3/uL (ref 0.2–0.9)
MONOS PCT: 9 %
Neutro Abs: 5.4 10*3/uL (ref 1.4–6.5)
Neutrophils Relative %: 70 %
PLATELETS: 325 10*3/uL (ref 150–440)
RBC: 4.06 MIL/uL (ref 3.80–5.20)
RDW: 13.1 % (ref 11.5–14.5)
WBC: 7.6 10*3/uL (ref 3.6–11.0)

## 2017-09-17 LAB — SURGICAL PATHOLOGY

## 2017-09-17 MED ORDER — LETROZOLE 2.5 MG PO TABS: 2.5000 mg | ORAL_TABLET | Freq: Every day | ORAL | 5 refills | Status: DC

## 2017-09-17 MED ORDER — OXYCODONE-ACETAMINOPHEN 5-325 MG PO TABS: 1.0000 | ORAL_TABLET | Freq: Four times a day (QID) | ORAL | 0 refills | Status: DC | PRN

## 2017-09-17 MED ORDER — PALBOCICLIB 100 MG PO CAPS: 100.0000 mg | ORAL_CAPSULE | Freq: Every day | ORAL | 5 refills | Status: AC

## 2017-09-17 MED ORDER — PALBOCICLIB 100 MG PO CAPS: 100.0000 mg | ORAL_CAPSULE | Freq: Every day | ORAL | 5 refills | Status: DC

## 2017-09-17 NOTE — Progress Notes (Signed)
START ON PATHWAY REGIMEN - Breast     A cycle is every 28 days:     Letrozole      Palbociclib   **Always confirm dose/schedule in your pharmacy ordering system**  Patient Characteristics: Distant Metastases or Locoregional Recurrent Disease - Unresected, HER2 Negative/Unknown/Equivocal, ER Positive, Endocrine Therapy, Postmenopausal, First Line, Prior Adjuvant Endocrine Therapy Completed > 12 Months Ago, Candidate for CDK4/6 Inhibitor Therapeutic Status: Distant Metastases BRCA Mutation Status: Did Not Order Test ER Status: Positive (+) HER2 Status: Unknown PR Status: Unknown Menopausal Status: Postmenopausal Line of Therapy: First Line Previous Therapy: Prior Adjuvant Endocrine Therapy Completed > 12 Months Ago Intent of Therapy: Non-Curative / Palliative Intent, Discussed with Patient

## 2017-09-17 NOTE — Telephone Encounter (Signed)
Oral Oncology Patient Advocate Encounter  Prior Authorization for Leslee Home has been approved.    Effective dates: 09/17/17 through 03/12/18.  Oral Oncology Clinic will continue to follow.  [place] Patient [place] Phone [phone] Fax [phone] 09/17/2017 4:15 PM

## 2017-09-17 NOTE — Telephone Encounter (Signed)
Oral Oncology Pharmacist Encounter  Received new prescription for Ibrance (palbociclib) for the treatment of metastatic ER+ breast cancer in conjunction with letrozole, planned duration until disease progression or unacceptable drug toxicity.  CBC from 09/17/17 and CMP 09/07/17 assessed, no relevant lab abnormalities. Prescription dose and frequency assessed.   Current medication list in Epic reviewed, a few DDIs with Ibrance identified: - Ibrance can increase the concentrations of atorvastatin, buspirone, diazapam, and oxycodone. These interaction should be monitored, no dosing adjustement needed at the time. Monitor patient for an increase in side effects related to higher concentrations of the previous listed medication.   Prescription has been e-scribed to the [facility] for benefits analysis and approval.  Oral Oncology Clinic will continue to follow for insurance authorization, copayment issues, initial counseling and start date.  Darl Pikes, PharmD, BCPS, [facility] Hematology/Oncology Clinical Pharmacist [facility] [place] [facility] [phone]  09/17/2017 1:44 PM

## 2017-09-17 NOTE — Telephone Encounter (Signed)
Oral Oncology Patient Advocate Encounter  Received notification from [employer] that prior authorization for Breanna Zhang is required.  PA submitted on CoverMyMeds Key [id number] Status is pending  Oral Oncology Clinic will continue to follow.  [place] Patient [place] Phone [phone] Fax [phone] 09/17/2017 2:47 PM

## 2017-09-17 NOTE — Progress Notes (Signed)
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  She underwent mastectomy followed by adjuvant chemotherapy and 5 years of tamoxifen.  Patient has been having some low back pain and underwent a CT lumbar spine without contrast on 08/10/2017 which was done by the pain clinic.  CT mainly showed age-related degenerative disease but also showed incidental nodular thickening of the posterior left diaphragm concerning for tumor and a CT chest was recommended.  CT chest on 08/14/2017 showed scattered left pleural nodularity with tiny left pleural effusion and prominent left internal mammary and juxtadiaphragmatic lymph nodes which are nonspecific but metastatic disease could not be ruled out.  This was followed by a PET/CT scan on 08/22/2017 which showed numerous small left-sided pleural nodules which were mildly hypermetabolic along with hypermetabolic mediastinal lymph nodes concerning for metastatic disease.  Small metastatic nodule in the left upper quadrant partly in the retrocrural space.  No findings of pulmonary metastatic or osseous metastatic disease.   Patient underwent FNA of retrocrural LN which was positive for carcinoma but primary could not be ascertained. She then underwent pleural biopsy which showed metastatic carcinoma consistent with breast primary. ER+  Interval history- reports some pain and discomfort at chest tube site. She is almost running out  of her percocet and is requesting refills.   ECOG PS- 1-2 Pain scale- 4 Opioid associated constipation- no  Review of systems- Review of Systems  Constitutional: Negative for chills, fever, malaise/fatigue and weight loss.  HENT: Negative for congestion, ear discharge and nosebleeds.   Eyes: Negative for blurred vision.  Respiratory: Negative for cough, hemoptysis, sputum production, shortness of breath and wheezing.   Cardiovascular: Negative for chest pain, palpitations, orthopnea and claudication.       Chest wall pain  Gastrointestinal: Negative for abdominal pain, blood in stool, constipation, diarrhea, heartburn, melena, nausea and vomiting.  Genitourinary: Negative for dysuria, flank pain, frequency, hematuria and urgency.  Musculoskeletal: Negative for back pain, joint pain and myalgias.  Skin: Negative for rash.  Neurological: Negative for dizziness, tingling, focal weakness, seizures, weakness and headaches.  Endo/Heme/Allergies: Does not bruise/bleed easily.  Psychiatric/Behavioral: Negative for depression and suicidal ideas. The patient does not have insomnia.      Allergies  Allergen Reactions  . Imipramine Tinitus  . Latex Rash  . Penicillins Rash  . Tape Rash     Past Medical History:  Diagnosis Date  . Anxiety   . Atrial fibrillation ([place])   . Breast cancer ([place]) 1999   RT MASTECTOMY  . DDD (degenerative disc disease), cervical   . Depression   . Diverticulosis   . Diverticulosis   . DVT of lower extremity (deep venous thrombosis) ([place])   . Dyspnea   . GERD (gastroesophageal reflux disease)   . Goiter   . Headache   . History of chemotherapy 2000   BREAST CA  . Hypertension   . Incontinence   . OSA (obstructive sleep apnea)   . Osteoporosis   . Personal history  of chemotherapy 1999   BREAST CA  . Status post chemotherapy    2000 right breast cancer  . Thyroid disease   . UTI (urinary tract infection)   . Vertigo      Past Surgical History:   Procedure Laterality Date  . ABDOMINAL HYSTERECTOMY    . BREAST BIOPSY Left 2006   negative  . CHOLECYSTECTOMY    . COCHLEAR IMPLANT Right   . COLONOSCOPY WITH PROPOFOL N/A 07/24/2016   Procedure: COLONOSCOPY WITH PROPOFOL;  Surgeon: Manya Silvas, MD;  Location: [facility] ENDOSCOPY;  Service: Endoscopy;  Laterality: N/A;  . ESOPHAGOGASTRODUODENOSCOPY (EGD) WITH PROPOFOL N/A 04/12/2016   Procedure: ESOPHAGOGASTRODUODENOSCOPY (EGD) WITH PROPOFOL;  Surgeon: Manya Silvas, MD;  Location: [facility] ENDOSCOPY;  Service: Endoscopy;  Laterality: N/A;  . EYE SURGERY Bilateral    cataract  . FOOT SURGERY Bilateral   . HAND SURGERY Right   . MASTECTOMY Right 1999   BREAST CA  . MASTECTOMY Right 1999  . PARTIAL COLECTOMY    . THYROIDECTOMY    . VIDEO ASSISTED THORACOSCOPY Left 09/10/2017   Procedure: VIDEO ASSISTED THORACOSCOPY;  Surgeon: [name], MD;  Location: [facility] ORS;  Service: Thoracic;  Laterality: Left;  with biopsies  . VIDEO BRONCHOSCOPY  09/10/2017   Procedure: VIDEO BRONCHOSCOPY;  Surgeon: [name], MD;  Location: [facility] ORS;  Service: Thoracic;;    Social History   Socioeconomic History  . Marital status: Married    Spouse name: Not on file  . Number of children: Not on file  . Years of education: Not on file  . Highest education level: Not on file  Occupational History  . Not on file  Social Needs  . Financial resource strain: Not on file  . Food insecurity:    Worry: Not on file    Inability: Not on file  . Transportation needs:    Medical: Not on file    Non-medical: Not on file  Tobacco Use  . Smoking status: Never Smoker  . Smokeless tobacco: Never Used  Substance and Sexual Activity  . Alcohol use: No    Alcohol/week: 0.0 oz  . Drug use: No  . Sexual activity: Never  Lifestyle  . Physical activity:    Days per week: Not on file    Minutes per session: Not on file  . Stress: Not on file  Relationships  . Social connections:    Talks on phone: Not on  file    Gets together: Not on file    Attends religious service: Not on file    Active member of club or organization: Not on file    Attends meetings of clubs or organizations: Not on file    Relationship status: Not on file  . Intimate partner violence:    Fear of current or ex partner: Not on file    Emotionally abused: Not on file    Physically abused: Not on file    Forced sexual activity: Not on file  Other Topics Concern  . Not on file  Social History Narrative  . Not on file    Family History  Problem Relation Age of Onset  . Dementia Mother   . Dementia Sister   . Diabetes Sister   . Heart attack Brother   . COPD Brother   . Colon cancer Brother   . Liver cancer Brother   . COPD Brother   . Breast cancer Neg Hx      Current Outpatient Medications:  .  atorvastatin (LIPITOR) 40 MG tablet, Take 40 mg by mouth at bedtime., Disp: , Rfl:  .  busPIRone (BUSPAR) 10 MG tablet, Take 1 tablet (10 mg total) by mouth daily. (Patient not taking: Reported on 09/10/2017), Disp: 30 tablet, Rfl: 2 .  cholecalciferol (VITAMIN D) 1000 units tablet, Take 1,000 Units by mouth daily., Disp: , Rfl:  .  cyanocobalamin (V-R VITAMIN B-12) 500 MCG tablet, Take 500 mcg by mouth daily. , Disp: , Rfl:  .  diazepam (VALIUM) 2 MG tablet, Take 2 mg by mouth every 6 (six) hours as needed for anxiety., Disp: , Rfl:  .  DULoxetine (CYMBALTA) 60 MG capsule, Take 1 capsule (60 mg total) by mouth every morning., Disp: 90 capsule, Rfl: 1 .  pantoprazole (PROTONIX) 40 MG tablet, Take 40 mg by mouth 2 (two) times daily. , Disp: , Rfl: 11 .  SYNTHROID 88 MCG tablet, Take 88 mcg by mouth daily., Disp: , Rfl: 11 .  traZODone (DESYREL) 100 MG tablet, Take 1 tablet (100 mg total) by mouth at bedtime as needed for sleep., Disp: 90 tablet, Rfl: 1  Physical exam:  Vitals:   09/17/17 1018  BP: 121/75  Pulse: 87  Resp: 18  Temp: (!) 96.6 F (35.9 C)  TempSrc: Tympanic  SpO2: 100%  Weight: 162 lb 14.7 oz (73.9  kg)  Height: 5\' 6"  (1.676 m)   Physical Exam  Constitutional: She is oriented to person, place, and time. She appears well-developed and well-nourished.  Elderly female in no acute distress  HENT:  Head: Normocephalic and atraumatic.  Eyes: Pupils are equal, round, and reactive to light. EOM are normal.  Neck: Normal range of motion.  Cardiovascular: Normal rate, regular rhythm and normal heart sounds.  Pulmonary/Chest: Effort normal and breath sounds normal.  Abdominal: Soft. Bowel sounds are normal.  Neurological: She is alert and oriented to person, place, and time.  Skin: Skin is warm and dry.     CMP Latest Ref Rng & Units 09/12/2017  Glucose 70 - 99 mg/dL 159(H)  BUN 8 - 23 mg/dL 9  Creatinine 0.44 - 1.00 mg/dL 0.67  Sodium 135 - 145 mmol/L 137  Potassium 3.5 - 5.1 mmol/L 3.0(L)  Chloride 98 - 111 mmol/L 103  CO2 22 - 32 mmol/L 27  Calcium 8.9 - 10.3 mg/dL 7.6(L)  Total Protein 6.5 - 8.1 g/dL -  Total Bilirubin 0.3 - 1.2 mg/dL -  Alkaline Phos 38 - 126 U/L -  AST 15 - 41 U/L -  ALT 0 - 44 U/L -   CBC Latest Ref Rng & Units 09/12/2017  WBC 3.6 - 11.0 K/uL 9.2  Hemoglobin 12.0 - 16.0 g/dL 11.5(L)  Hematocrit 35.0 - 47.0 % 33.4(L)  Platelets 150 - 440 K/uL 210    No images are attached to the encounter.  Dg Chest 2 View  Result Date: 09/11/2017 CLINICAL DATA:  Post operative check, chest tube, atrial fibrillation, hypertension EXAM: CHEST - 2 VIEW COMPARISON:  09/10/2017 FINDINGS: LEFT thoracostomy tube unchanged. Upper normal heart size. Mediastinal contours and pulmonary vascularity normal. Eventration LEFT diaphragm. Mild bibasilar atelectasis. Tiny LEFT apex pneumothorax. Remaining lungs clear. Bones demineralized. Lateral view degraded by respiratory motion artifacts. IMPRESSION: Stable LEFT thoracostomy tube with tiny LEFT apex pneumothorax and mild bibasilar atelectasis. Electronically Signed   By: Lavonia Dana M.D.   On: 09/11/2017 14:14   Nm Pet Image Initial (pi)  Skull Base To Thigh  Result Date: 08/22/2017 CLINICAL DATA:  Initial treatment strategy for  left pleural nodules. EXAM: NUCLEAR MEDICINE PET SKULL BASE TO THIGH TECHNIQUE: 7.74 mCi F-18 FDG was injected intravenously. Full-ring PET imaging was performed from the skull base to thigh after the radiotracer. CT data was obtained and used for attenuation correction and anatomic localization. Fasting blood glucose: 89 mg/dl COMPARISON:  CT chest 08/14/2017 FINDINGS: Mediastinal blood pool activity: SUV max 1.75 NECK: No hypermetabolic lymph nodes in the neck. Incidental CT findings: none CHEST: 9 mm precarinal lymph node on image number 84 is mildly hypermetabolic with SUV max of 7.34. 13 mm subcarinal lymph node is hypermetabolic with SUV max of 1.93 Small left internal mammary lymph node on image number 84 is weakly hypermetabolic with SUV max of 2.2. Pleural nodularity at the left lung base is hypermetabolic with SUV max of 7.90. Small pleural nodules along the left anterior mediastinum are weakly hypermetabolic. 8 mm epicardial lymph node on the left on image number 127 is weakly hypermetabolic with SUV max of 2.40. No enlarged or hypermetabolic supraclavicular or axillary nodes. Incidental CT findings: No worrisome pulmonary nodules. Moderate-sized hiatal hernia. ABDOMEN/PELVIS: In the left upper quadrant there is a small soft tissue lesion which may be partly in the retrocrural space. It measures 15 mm on image number 144 and demonstrates hypermetabolism with SUV max of 4.08. No mesenteric or retroperitoneal lymphadenopathy. No abnormalities involving the solid abdominal organs. No pelvic adenopathy or mass. Incidental CT findings: Moderate diffuse bladder wall thickening and bladder calculi noted. Small cystocele. SKELETON: No focal hypermetabolic activity to suggest skeletal metastasis. Incidental CT findings: none IMPRESSION: 1. Numerous small left-sided pleural nodules and small but hypermetabolic mediastinal  lymph nodes, worrisome for metastatic breast cancer. 2. Small metastatic nodule in the left upper quadrant partly in the retrocrural space. 3. No findings for pulmonary metastatic disease or osseous metastatic disease. Electronically Signed   By: Marijo Sanes M.D.   On: 08/22/2017 11:02   Ct Biopsy  Result Date: 09/04/2017 INDICATION: 77 year old with remote history of breast cancer. The patient has new left pleural nodularity and a few small suspicious lymph nodes. Plan for CT-guided biopsy of a left retrocrural lymph node. EXAM: CT-GUIDED BIOPSY OF LEFT RETROCRURAL/RETROPERITONEAL LYMPH NODE MEDICATIONS: None. ANESTHESIA/SEDATION: Moderate (conscious) sedation was employed during this procedure. A total of Versed 3 mg and Fentanyl 100 mcg was administered intravenously. Moderate Sedation Time: 42 minutes. The patient's level of consciousness and vital signs were monitored continuously by radiology nursing throughout the procedure under my direct supervision. FLUOROSCOPY TIME:  None COMPLICATIONS: None immediate. PROCEDURE: Informed written consent was obtained from the patient after a thorough discussion of the procedural risks, benefits and alternatives. All questions were addressed. A timeout was performed prior to the initiation of the procedure. Patient was placed prone on the CT scanner. CT images through the lower chest and abdomen were obtained following the administration of IV contrast. 75 mL Isovue 300 was given. The area of concern in the left retrocrural/upper retroperitoneal area was identified. The overlying skin was prepped and draped in sterile fashion. Skin was anesthetized with 1% lidocaine. 17 gauge coaxial needle was directed towards the abnormal soft tissue with CT guidance. Needle was positioned along the posterior aspect of the suspicious tissue. A 21 gauge Chiba needle was directed into the lymph node in order to assess the needle path. The needle was felt to be too close to the  tortuous left renal artery. Therefore, core biopsies were not obtained. However, a total of 4 fine-needle aspirations were obtained with three 21  gauge needles and one 22 gauge needle. There was a small amount of bleeding from the 17 gauge needle hub after the final fine-needle aspiration. Follow up CT images demonstrated a small amount of blood in the retroperitoneal space and around the left renal vessels. Patient remained hemodynamically stable following the procedure. The 17 gauge needle was removed and additional CT images were obtained. Bandage placed over the puncture site. FINDINGS: Poorly defined soft tissue in the left retrocrural/retroperitoneal region adjacent to the left kidney. The left renal artery has a tortuous course and located just anterior to the soft tissue. This soft tissue roughly measures 1.6 cm in the largest dimension. The fine-needle aspiration needle was confirmed within this soft tissue. Small amount of bleeding around the biopsied area and renal vessels at the end of the procedure. IMPRESSION: CT-guided fine-needle aspirations of the left retrocrural soft tissue. Core biopsies could not be safely performed due to the adjacent vascular structures. Electronically Signed   By: Markus Daft M.D.   On: 09/04/2017 15:01   Dg Chest Port 1 View  Result Date: 09/10/2017 CLINICAL DATA:  Status post VATS EXAM: PORTABLE CHEST 1 VIEW COMPARISON:  06/07/2017 FINDINGS: Cardiac shadow is stable. Left chest tube is now seen in satisfactory position. No pneumothorax is seen. Minimal bibasilar atelectasis is noted. No acute bony abnormality is seen. IMPRESSION: No pneumothorax.  Left chest tube in satisfactory position. Electronically Signed   By: Inez Catalina M.D.   On: 09/10/2017 12:23     Assessment and plan- Patient is a 77 y.o. 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 Low volume disease  I am expecting her 2 testing results to be back today and given the  low volume disease incidentally found on imaging, I suspect it will be negative. I would recommend ibrance + letrozole for low volume ER + metastatic breast cancer. Given her age Leslee Home dose I would start at 100 mg 3 weeks 1 and 1 week off every 28 days until progression or toxicity. If she has good response to treatment we could consider stopping ibrance after 6 months to a year based on her tolerance. Discussed risks and benefits of ibrance including all but not limited to fatigue, hair loss, diarrhea, cytopenias and risk of infections.  Discussed risks and benefits of letrozole (to be taken daily at 2.5 mg) including all but not limited to fatigue, mood swings, arthralgias, cardiovascular side effects and worsening bone health. Will obtain a baseline bone density scan at this time. She will also take calcium 1200 mg and vit D 800 IU along with letrozole  Treatment will be given with a palliative intent. Depending on response, will consider doing omniseq testing at progression.  Depending on when she starts ibrance, I will arrange her follow up accordingly.  pateint and her family had multiple insightful questions and all fo them were answered to their staisfaction  I will refill percocet for her for 2 weeks. She has hypokalemia at Dr. Soyla Dryer visit which is now normalized. Will check cbc, cmp, ca 27.29, ca 15.3 today   Total face to face encounter time for this patient visit was 45 min. >50% of the time was  spent in counseling and coordination of care.     Visit Diagnosis 1. Metastatic breast cancer ([place])   2. Goals of care, counseling/discussion      Dr. Randa Evens, MD, MPH [facility] at [facility] [phone] 09/17/2017 11:59 AM

## 2017-09-17 NOTE — Progress Notes (Signed)
Potassium is now normal

## 2017-09-18 ENCOUNTER — Telehealth: Payer: Self-pay | Admitting: Pharmacy Technician

## 2017-09-18 DIAGNOSIS — M5136 Other intervertebral disc degeneration, lumbar region: Secondary | ICD-10-CM | POA: Diagnosis not present

## 2017-09-18 DIAGNOSIS — M5416 Radiculopathy, lumbar region: Secondary | ICD-10-CM | POA: Diagnosis not present

## 2017-09-18 LAB — CANCER ANTIGEN 27.29: CA 27.29: 41.7 U/mL — ABNORMAL HIGH (ref 0.0–38.6)

## 2017-09-18 LAB — CANCER ANTIGEN 15-3: CAN 15 3: 45.4 U/mL — AB (ref 0.0–25.0)

## 2017-09-18 MED FILL — IBRANCE 100 MG CAPSULE: 100 | 28 days supply | Qty: 21 | Fill #0

## 2017-09-18 NOTE — Telephone Encounter (Signed)
Oral Oncology Patient Advocate Encounter  Patient approved for 2 grants to cover copay.  PANF Was successful in securing patient an $42 grant from Patient [employer] [facility]) to provide copayment coverage for her Ibrance.  This will keep the out of pocket expense at $0.    The billing information is as follows and has been shared with [place].   Member ID: [id number] Group ID: [id number] RxBin: [id number] Dates of Eligibility: 06/19/17 through 09/17/18   [facility] Was successful in securing patient a $6000 grant from [employer] to provide copayment coverage for [place].  This will keep the out of pocket expense at $0.    The billing information is as follows and has been shared with [place].   Member ID: [id number] Group ID: [id number] RxBin: [id number] PCN: [place] Patient [place] Phone [phone] Fax [phone] 09/18/2017 8:31 AM

## 2017-09-21 ENCOUNTER — Other Ambulatory Visit: Payer: Self-pay

## 2017-09-21 DIAGNOSIS — C3492 Malignant neoplasm of unspecified part of left bronchus or lung: Secondary | ICD-10-CM

## 2017-09-21 DIAGNOSIS — R918 Other nonspecific abnormal finding of lung field: Secondary | ICD-10-CM

## 2017-09-24 ENCOUNTER — Ambulatory Visit (INDEPENDENT_AMBULATORY_CARE_PROVIDER_SITE_OTHER): Payer: PPO | Admitting: Psychiatry

## 2017-09-24 ENCOUNTER — Encounter: Payer: Self-pay | Admitting: Psychiatry

## 2017-09-24 ENCOUNTER — Other Ambulatory Visit: Payer: Self-pay

## 2017-09-24 ENCOUNTER — Ambulatory Visit
Admission: RE | Admit: 2017-09-24 | Discharge: 2017-09-24 | Disposition: A | Discharge status: Home / Self Care | Payer: PPO | Source: Ambulatory Visit | Attending: Oncology | Admitting: Oncology

## 2017-09-24 VITALS — BP 158/82 | HR 84 | Wt 163.8 lb

## 2017-09-24 DIAGNOSIS — Z853 Personal history of malignant neoplasm of breast: Secondary | ICD-10-CM | POA: Insufficient documentation

## 2017-09-24 DIAGNOSIS — F331 Major depressive disorder, recurrent, moderate: Secondary | ICD-10-CM | POA: Diagnosis not present

## 2017-09-24 DIAGNOSIS — F5105 Insomnia due to other mental disorder: Secondary | ICD-10-CM

## 2017-09-24 DIAGNOSIS — Z13820 Encounter for screening for osteoporosis: Secondary | ICD-10-CM | POA: Diagnosis not present

## 2017-09-24 DIAGNOSIS — M8580 Other specified disorders of bone density and structure, unspecified site: Secondary | ICD-10-CM | POA: Insufficient documentation

## 2017-09-24 DIAGNOSIS — Z780 Asymptomatic menopausal state: Secondary | ICD-10-CM | POA: Insufficient documentation

## 2017-09-24 DIAGNOSIS — M8589 Other specified disorders of bone density and structure, multiple sites: Secondary | ICD-10-CM | POA: Diagnosis not present

## 2017-09-24 DIAGNOSIS — F4310 Post-traumatic stress disorder, unspecified: Secondary | ICD-10-CM

## 2017-09-24 DIAGNOSIS — C50919 Malignant neoplasm of unspecified site of unspecified female breast: Secondary | ICD-10-CM | POA: Insufficient documentation

## 2017-09-24 MED ORDER — BUSPIRONE HCL 10 MG PO TABS: 10.0000 mg | ORAL_TABLET | Freq: Every day | ORAL | 2 refills | Status: DC

## 2017-09-24 MED ORDER — TRAZODONE HCL 100 MG PO TABS: 100.0000 mg | ORAL_TABLET | Freq: Every evening | ORAL | 1 refills | Status: DC | PRN

## 2017-09-24 MED ORDER — DULOXETINE HCL 60 MG PO CPEP: 60.0000 mg | ORAL_CAPSULE | Freq: Every morning | ORAL | 1 refills | Status: DC

## 2017-09-24 NOTE — Progress Notes (Signed)
Psychiatric Follow Up MD  Note  Patient Identification: Breanna Zhang MRN:  [id number] Date of Evaluation:  09/24/2017 Referral Source: Dr Breanna Zhang  Chief Complaint:   Chief Complaint    Follow-up; Medication Refill     Visit Diagnosis:    ICD-10-CM   1. MDD (major depressive disorder), recurrent episode, moderate (HCC) F33.1   2. Post traumatic stress disorder (PTSD) F43.10    Diagnosis:   Patient Active Problem List   Diagnosis Date Noted  . Metastatic breast cancer ([place]) [C50.919] 09/17/2017  . Lung cancer ([place]) [C34.90] 09/10/2017  . Headache disorder [R51] 02/26/2017  . Arthralgia of both hands [M25.541, M25.542] 05/24/2016  . Personal history of malignant neoplasm of breast [Z85.3] 09/08/2015  . Adult hypothyroidism [E03.9] 04/06/2015  . Blood glucose elevated [R73.9] 09/07/2014  . 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 Clinical depression [F32.9] 06/22/2014  . Acid reflux [K21.9] 06/22/2014  . BP (high blood pressure) [I10] 06/22/2014  . Malignant neoplasm of breast ([place]) [C50.919] 06/22/2014  . Frequent UTI [N39.0] 06/22/2014  . Apnea, sleep [G47.30] 06/22/2014  . Head revolving around [R42] 06/22/2014  . Dizziness [R42] 04/16/2014  . 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 Neuritis or radiculitis due to rupture of lumbar intervertebral disc [M51.16] 10/06/2013  . 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 Lumbar radiculitis [M54.16] 10/06/2013  . Cystocele, midline [N81.11] 11/16/2011  . Female genuine stress incontinence [N39.3] 11/16/2011  . Incomplete bladder emptying [R33.9] 11/16/2011  . 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 Excessive urination at night [R35.1] 11/16/2011  . Neuralgia neuritis, sciatic nerve [M54.30] 11/16/2011  . Urge incontinence of urine [N39.41] 11/16/2011   History of Present Illness:  Patient is a 77 year old married female who presented for follow-up.she reported  that she was recently diagnosed with cancer in her left breast and she has been feeling depressed.  She reported that she was having pain underwent several testing.  She reported that she has been started on medications.  She has lung cancer as well as metastatic breast cancer.  Patient reported that her family is supportive and her daughter and her son are visiting her on the weekends and helping her with her appointments.  Patient reported that she was having severe pain in her back as well as constant headaches.  Patient reported that she has been having chronic pain in her back and she is getting pain medications through her primary care physician.  Discussed with patient about her medications and she reported that her current psychotropic medications are helping and she does not want to change anything at this time.  She appears anxious about her new diagnosis but remains optimistic about her treatment.  She reported that she survived the cancer 20 years ago and believes that the medications will help her.    Daughter Breanna Zhang is a very supportive and helps her with her ADLs .  Patient denied having any suicidal homicidal ideations or plans.  She denied having any perceptual disturbances.  She continues her treatment as prescribed.            Elements:  Severity:  mild. Associated Signs/Symptoms: Depression Symptoms:  loss of energy/fatigue, (Hypo) Manic Symptoms:  none Anxiety Symptoms:  Excessive Worry, Psychotic Symptoms:  none PTSD Symptoms: Negative NA  Past Medical History:  Past Medical History:  Diagnosis Date  . Anxiety   . Atrial fibrillation ([place])   . Breast cancer ([place]) 1999   RT MASTECTOMY  . DDD (degenerative  disc disease), cervical   . Depression   . Diverticulosis   . Diverticulosis   . DVT of lower extremity (deep venous thrombosis) ([place])   . Dyspnea   . GERD (gastroesophageal reflux disease)   . Goiter   . Headache   . History of chemotherapy 2000   BREAST  CA  . Hypertension   . Incontinence   . OSA (obstructive sleep apnea)   . Osteoporosis   . Personal history of chemotherapy 1999   BREAST CA  . Status post chemotherapy    2000 right breast cancer  . Thyroid disease   . UTI (urinary tract infection)   . Vertigo     Past Surgical History:  Procedure Laterality Date  . ABDOMINAL HYSTERECTOMY    . BREAST BIOPSY Left 2006   negative  . CHOLECYSTECTOMY    . COCHLEAR IMPLANT Right   . COLONOSCOPY WITH PROPOFOL N/A 07/24/2016   Procedure: COLONOSCOPY WITH PROPOFOL;  Surgeon: Manya Silvas, MD;  Location: [facility] ENDOSCOPY;  Service: Endoscopy;  Laterality: N/A;  . ESOPHAGOGASTRODUODENOSCOPY (EGD) WITH PROPOFOL N/A 04/12/2016   Procedure: ESOPHAGOGASTRODUODENOSCOPY (EGD) WITH PROPOFOL;  Surgeon: Manya Silvas, MD;  Location: [facility] ENDOSCOPY;  Service: Endoscopy;  Laterality: N/A;  . EYE SURGERY Bilateral    cataract  . FOOT SURGERY Bilateral   . HAND SURGERY Right   . MASTECTOMY Right 1999   BREAST CA  . MASTECTOMY Right 1999  . PARTIAL COLECTOMY    . THYROIDECTOMY    . VIDEO ASSISTED THORACOSCOPY Left 09/10/2017   Procedure: VIDEO ASSISTED THORACOSCOPY;  Surgeon: [name], MD;  Location: [facility] ORS;  Service: Thoracic;  Laterality: Left;  with biopsies  . VIDEO BRONCHOSCOPY  09/10/2017   Procedure: VIDEO BRONCHOSCOPY;  Surgeon: [name], MD;  Location: [facility] ORS;  Service: Thoracic;;   Family History:  Family History  Problem Relation Age of Onset  . Dementia Mother   . Dementia Sister   . Diabetes Sister   . Heart attack Brother   . COPD Brother   . Colon cancer Brother   . Liver cancer Brother   . COPD Brother   . Breast cancer Neg Hx    Social History:   Social History   Socioeconomic History  . Marital status: Married    Spouse name: evert  . Number of children: 4  . Years of education: Not on file  . Highest education level: 9th grade  Occupational History  . Not on file  Social Needs  . Financial  resource strain: Somewhat hard  . Food insecurity:    Worry: Often true    Inability: Often true  . Transportation needs:    Medical: No    Non-medical: No  Tobacco Use  . Smoking status: Never Smoker  . Smokeless tobacco: Never Used  Substance and Sexual Activity  . Alcohol use: No    Alcohol/week: 0.0 oz  . Drug use: No  . Sexual activity: Never  Lifestyle  . Physical activity:    Days per week: 0 days    Minutes per session: 0 min  . Stress: Not at all  Relationships  . Social connections:    Talks on phone: Not on file    Gets together: Not on file    Attends religious service: More than 4 times per year    Active member of club or organization: No    Attends meetings of clubs or organizations: Never    Relationship status: Married  Other Topics  Concern  . Not on file  Social History Narrative  . Not on file   Additional Social History: Married x 4.  Husband is alcoholic. She is concerned about is behavoir. 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  Has 4 children.   Musculoskeletal: Strength & Muscle Tone: within normal limits Gait & Station: normal Patient leans: N/A  Psychiatric Specialty Exam: Medication Refill     ROS  Blood pressure (!) 158/82, pulse 84, weight 163 lb 12.8 oz (74.3 kg).Body mass index is 26.44 kg/m.  General Appearance: Casual  Eye Contact:  Fair  Speech:  Clear and Coherent  Volume:  Normal  Mood:  Anxious  Affect:  Congruent  Thought Process:  Coherent and Goal Directed  Orientation:  Full (Time, Place, and Person)  Thought Content:  WDL  Suicidal Thoughts:  No  Homicidal Thoughts:  No  Memory:  Immediate;   Fair  Judgement:  Fair  Insight:  Fair  Psychomotor Activity:  Normal  Concentration:  Fair  Recall:  [employer] [place]  Language: Fair  Akathisia:  No  Handed:  Right  AIMS (if indicated):    Assets:  Communication Skills Desire for Improvement Physical Health Social  Support Transportation  ADL's:  Intact  Cognition: WNL  Sleep:  7-8   Is the patient at risk to self?  No. Has the patient been a risk to self in the past 6 months?  No. Has the patient been a risk to self within the distant past?  No. Is the patient a risk to others?  No. Has the patient been a risk to others in the past 6 months?  No. Has the patient been a risk to others within the distant past?  No.  Allergies:   Allergies  Allergen Reactions  . Imipramine Tinitus  . Latex Rash  . Penicillins Rash  . Tape Rash   Current Medications: Current Outpatient Medications  Medication Sig Dispense Refill  . atorvastatin (LIPITOR) 40 MG tablet Take 40 mg by mouth at bedtime.    . busPIRone (BUSPAR) 10 MG tablet Take 1 tablet (10 mg total) by mouth daily. 30 tablet 2  . cholecalciferol (VITAMIN D) 1000 units tablet Take 1,000 Units by mouth daily.    . cyanocobalamin (V-R VITAMIN B-12) 500 MCG tablet Take 500 mcg by mouth daily.     . diazepam (VALIUM) 2 MG tablet Take 2 mg by mouth every 6 (six) hours as needed for anxiety.    . DULoxetine (CYMBALTA) 60 MG capsule Take 1 capsule (60 mg total) by mouth every morning. 90 capsule 1  . letrozole (FEMARA) 2.5 MG tablet Take 1 tablet (2.5 mg total) by mouth daily. 30 tablet 5  . oxyCODONE-acetaminophen (PERCOCET/ROXICET) 5-325 MG tablet Take 1 tablet by mouth every 6 (six) hours as needed for severe pain. 30 tablet 0  . palbociclib (IBRANCE) 100 MG capsule Take 1 capsule (100 mg total) by mouth daily with breakfast. Take whole with food. Take for 21 days on, 7 days off, repeat Q28 days. 21 capsule 5  . pantoprazole (PROTONIX) 40 MG tablet Take 40 mg by mouth 2 (two) times daily.   11  . SYNTHROID 88 MCG tablet Take 88 mcg by mouth daily.  11  . traZODone (DESYREL) 100 MG tablet Take 1 tablet (100 mg total) by mouth at bedtime as needed for sleep. 90 tablet 1   No current facility-administered medications for this visit.  Previous  Psychotropic Medications: as reported  Substance Abuse History in the last 12 months:  No.  Consequences of Substance Abuse: Negative NA  Medical Decision Making:  Review of Psycho-Social Stressors (1) and Review and summation of old records (2)  Treatment Plan Summary: Medication management   Medication refills for 90-day supply. Continue  Cymbalta 60 mg daily I will start her on BuSpar 10 mg in the morning.   Continue  trazodone 100 mg by mouth daily at bedtime when necessary for insomnia. She will follow-up in 2 months or earlier depending on her symptoms   More than 50% of the time spent in psychoeducation, counseling and coordination of care.    This note was generated in part or whole with voice recognition software. Voice regonition is usually quite accurate but there are transcription errors that can and very often do occur. I apologize for any typographical errors that were not detected and corrected.   Rainey Pines, MD   7/15/20199:05 AM

## 2017-09-25 ENCOUNTER — Ambulatory Visit
Admission: RE | Admit: 2017-09-25 | Discharge: 2017-09-25 | Disposition: A | Discharge status: Home / Self Care | Payer: PPO | Source: Ambulatory Visit | Attending: Cardiothoracic Surgery | Admitting: Cardiothoracic Surgery

## 2017-09-25 ENCOUNTER — Ambulatory Visit (INDEPENDENT_AMBULATORY_CARE_PROVIDER_SITE_OTHER): Payer: PPO | Admitting: Cardiothoracic Surgery

## 2017-09-25 ENCOUNTER — Encounter: Payer: Self-pay | Admitting: Cardiothoracic Surgery

## 2017-09-25 VITALS — BP 145/80 | HR 86 | Temp 98.1°F | Ht 66.0 in | Wt 162.4 lb

## 2017-09-25 DIAGNOSIS — R918 Other nonspecific abnormal finding of lung field: Secondary | ICD-10-CM

## 2017-09-25 DIAGNOSIS — C3492 Malignant neoplasm of unspecified part of left bronchus or lung: Secondary | ICD-10-CM | POA: Diagnosis not present

## 2017-09-25 NOTE — Patient Instructions (Signed)
We will send the referral to [facility]. Someone from their office will call to schedule an appointment.  If you do not hear from anyone within 5 days please call our office and let us know so we can check on this for you.  Please call our office if you have questions or concerns.

## 2017-09-25 NOTE — Progress Notes (Signed)
  Patient ID: Breanna Zhang, female   DOB: 07/22/1940, 77 y.o.   MRN: [id number]  HISTORY: She returns today in follow-up.  Unfortunately she does continue to complain of shortness of breath.  She states that this is constant and even at night despite using her CPAP she gets short of breath and has to get up out of bed.  She denied any fevers or chills or cough.  The final pathology did reveal an estrogen sensitive breast carcinoma and she is begun on therapy.   Vitals:   09/25/17 1519  BP: (!) 145/80  Pulse: 86  Temp: 98.1 F (36.7 C)     EXAM:  We did get a chest x-ray today.  It shows some blunting at the left costophrenic angle but otherwise is clear.  There is no pleural effusion or pneumothorax we did remove all of her sutures.  We Steri-Stripped her wound.   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  I explained to her that I did not see anything on her chest x-ray to suggest a potential cause but I be happy to refer her to 1 of our pulmonologist for their opinion.  I did not make a return visit for her but would be happy to see her should the need arise    [name], MD

## 2017-09-28 ENCOUNTER — Encounter: Payer: Self-pay | Admitting: Internal Medicine

## 2017-09-28 ENCOUNTER — Ambulatory Visit: Payer: PPO | Admitting: Internal Medicine

## 2017-09-28 VITALS — BP 110/68 | HR 83 | Ht 66.0 in | Wt 161.0 lb

## 2017-09-28 DIAGNOSIS — R0602 Shortness of breath: Secondary | ICD-10-CM | POA: Diagnosis not present

## 2017-09-28 MED ORDER — TIOTROPIUM BROMIDE MONOHYDRATE 1.25 MCG/ACT IN AERS: 2.0000 | INHALATION_SPRAY | Freq: Every day | RESPIRATORY_TRACT | 5 refills | Status: DC

## 2017-09-28 MED ORDER — TIOTROPIUM BROMIDE MONOHYDRATE 1.25 MCG/ACT IN AERS: 2.0000 | INHALATION_SPRAY | Freq: Two times a day (BID) | RESPIRATORY_TRACT | 5 refills | Status: DC

## 2017-09-28 NOTE — Progress Notes (Signed)
Name: Breanna Zhang MRN: [id number] DOB: 11-13-1940     CONSULTATION DATE: 7.19.19 REFERRING MD : Genevive Bi  CHIEF COMPLAINT: SOB  STUDIES:     CT chest Independently reviewed by Me 08/23/2017 NO Acute opacifications no masses seen no effusions  09/25/2017 chest x-ray interpreted by me Left sided opacification minimal effusion No acute process    HISTORY OF PRESENT ILLNESS: 77 year old pleasant white female seen today for shortness of breath Progressively worsening over the last 3 months  patient has a history of breast cancer was recently diagnosed with metastatic breast cancer to the left pleural space status post VATS by Dr. Genevive Bi  Patient has no signs and symptoms of infection at this time No signs or symptoms of acute heart failure at this time  Patient is a non-smoker However has worked in a [employer] all her life + environmental exposure  She denies chest pain She denies palpitations She denies swelling No fevers no chills No abdominal symptoms Patient does have a hearing aid in place  CT of the chest and chest x-ray reviewed independently       PAST MEDICAL HISTORY :   has a past medical history of Anxiety, Atrial fibrillation ([place]), Breast cancer ([place]) (1999), DDD (degenerative disc disease), cervical, Depression, Diverticulosis, Diverticulosis, DVT of lower extremity (deep venous thrombosis) ([place]), Dyspnea, GERD (gastroesophageal reflux disease), Goiter, Headache, History of chemotherapy (2000), Hypertension, Incontinence, OSA (obstructive sleep apnea), Osteoporosis, Personal history of chemotherapy (1999), Status post chemotherapy, Thyroid disease, UTI (urinary tract infection), and Vertigo.  has a past surgical history that includes Thyroidectomy; Cochlear implant (Right); Abdominal hysterectomy; Foot surgery (Bilateral); Hand surgery (Right); Cholecystectomy; Partial colectomy; Breast biopsy (Left, 2006); Mastectomy (Right, 1999); Esophagogastroduodenoscopy  (egd) with propofol (N/A, 04/12/2016); Colonoscopy with propofol (N/A, 07/24/2016); Mastectomy (Right, 1999); Eye surgery (Bilateral); Video assisted thoracoscopy (Left, 09/10/2017); and Video bronchoscopy (09/10/2017). Prior to Admission medications   Medication Sig Start Date End Date Taking? Authorizing Provider  atorvastatin (LIPITOR) 40 MG tablet Take 40 mg by mouth at bedtime. 09/07/14   [provider]  busPIRone (BUSPAR) 10 MG tablet Take 1 tablet (10 mg total) by mouth daily. 09/24/17   Rainey Pines, MD  cholecalciferol (VITAMIN D) 1000 units tablet Take 1,000 Units by mouth daily.    [provider]  ciprofloxacin (CIPRO) 500 MG tablet Take by mouth. 09/24/17 09/29/17  [provider]  cyanocobalamin (V-R VITAMIN B-12) 500 MCG tablet Take 500 mcg by mouth daily.     [provider]  DULoxetine (CYMBALTA) 60 MG capsule Take 1 capsule (60 mg total) by mouth every morning. 09/24/17   Rainey Pines, MD  letrozole [facility]) 2.5 MG tablet Take 1 tablet (2.5 mg total) by mouth daily. 09/17/17   Sindy Guadeloupe, MD  oxyCODONE-acetaminophen (PERCOCET/ROXICET) 5-325 MG tablet Take 1 tablet by mouth every 6 (six) hours as needed for severe pain. 09/17/17   Sindy Guadeloupe, MD  palbociclib [facility]) 100 MG capsule Take 1 capsule (100 mg total) by mouth daily with breakfast. Take whole with food. Take for 21 days on, 7 days off, repeat Q28 days. 09/17/17 01/21/18  Sindy Guadeloupe, MD  pantoprazole (PROTONIX) 40 MG tablet Take 40 mg by mouth 2 (two) times daily.     [provider]  SYNTHROID 88 MCG tablet Take 88 mcg by mouth daily. 03/04/15   [provider]  traZODone (DESYREL) 100 MG tablet Take 1 tablet (100 mg total) by mouth at bedtime as needed for sleep. 09/24/17  Rainey Pines, MD   Allergies  Allergen Reactions  . Imipramine Tinitus  . Latex Rash  . Penicillins Rash  . Tape Rash    FAMILY HISTORY:  family history includes COPD in her brother and brother;  Colon cancer in her brother; Dementia in her mother and sister; Diabetes in her sister; Heart attack in her brother; Liver cancer in her brother. SOCIAL HISTORY:  reports that she has never smoked. She has never used smokeless tobacco. She reports that she does not drink alcohol or use drugs.  REVIEW OF SYSTEMS:   Constitutional: Negative for fever, chills, weight loss, malaise/fatigue and diaphoresis.  HENT: Negative for hearing loss, ear pain, nosebleeds, congestion, sore throat, neck pain, tinnitus and ear discharge.   Eyes: Negative for blurred vision, double vision, photophobia, pain, discharge and redness.  Respiratory: -cough, -hemoptysis, -sputum production, +shortness of breath, -wheezing and -stridor.   Cardiovascular: Negative for chest pain, palpitations, orthopnea, claudication, leg swelling and PND.  Gastrointestinal: Negative for heartburn, nausea, vomiting, abdominal pain, diarrhea, constipation, blood in stool and melena.  Genitourinary: Negative for dysuria, urgency, frequency, hematuria and flank pain.  Musculoskeletal: Negative for myalgias, back pain, joint pain and falls.  Skin: Negative for itching and rash.  Neurological: Negative for dizziness, tingling, tremors, sensory change, speech change, focal weakness, seizures, loss of consciousness, weakness and headaches.  Endo/Heme/Allergies: Negative for environmental allergies and polydipsia. Does not bruise/bleed easily.  ALL OTHER ROS ARE NEGATIVE   BP 110/68 (BP Location: Left Arm, Cuff Size: Normal)   Pulse 83   Ht 5\' 6"  (1.676 m)   Wt 161 lb (73 kg)   SpO2 99%   BMI 25.99 kg/m    Physical Examination:   GENERAL:NAD, no fevers, chills, no weakness no fatigue HEAD: Normocephalic, atraumatic.  EYES: Pupils equal, round, reactive to light. Extraocular muscles intact. No scleral icterus.  MOUTH: Moist mucosal membrane.   EAR, NOSE, THROAT: Clear without exudates. No external lesions.  NECK: Supple. No  thyromegaly. No nodules. No JVD.  PULMONARY:CTA B/L no wheezes, no crackles, no rhonchi CARDIOVASCULAR: S1 and S2. Regular rate and rhythm. No murmurs, rubs, or gallops. No edema.  GASTROINTESTINAL: Soft, nontender, nondistended. No masses. Positive bowel sounds.  MUSCULOSKELETAL: No swelling, clubbing, or edema. Range of motion full in all extremities.  NEUROLOGIC: Cranial nerves II through XII are intact. No gross focal neurological deficits.  SKIN: No ulceration, lesions, rashes, or cyanosis. Skin warm and dry. Turgor intact.  PSYCHIATRIC: Mood, affect within normal limits. The patient is awake, alert and oriented x 3. Insight, judgment intact.      ASSESSMENT / PLAN: 77 year old pleasant white female seen today for progressive shortness of breath of the last several months with a history of metastatic breast cancer to the left pleural space  At this time the etiology of her shortness of breath and dyspnea on exertion is uncertain at this time She will need further testing with 6-minute walk test,  overnight pulse oximetry, pulmonary function testing and echocardiogram.  After careful review of her history she states she has been in the [employer] for her whole life which puts her at a risk for environmental exposure may cause probable underlying COPD/emphysema.  At this time we will start Spiriva Respimat therapy to assess her respiratory status Follow-up in 4 weeks for reassessment    Patient  satisfied with Plan of action and management. All questions answered  Corrin Parker, M.D.  [facility] Pulmonary & Critical Care Medicine  Medical Director [facility]  [facility] Cardio-Pulmonary Department

## 2017-09-28 NOTE — Addendum Note (Signed)
Addended by: Stephanie Coup on: 09/28/2017 12:08 PM   Modules accepted: Orders

## 2017-09-28 NOTE — Patient Instructions (Signed)
Check PFT's Check ONO Check 6MWT Check ECHO CARDIOGRAM  START SPIRIVA RESPIMAT

## 2017-09-28 NOTE — Progress Notes (Signed)
Patient seen in the office today and instructed on use of Spiriva Respimat.  Patient expressed understanding and demonstrated technique.  

## 2017-10-02 ENCOUNTER — Telehealth: Payer: Self-pay | Admitting: *Deleted

## 2017-10-02 ENCOUNTER — Encounter: Payer: Self-pay | Admitting: Internal Medicine

## 2017-10-02 ENCOUNTER — Telehealth: Payer: Self-pay | Admitting: Internal Medicine

## 2017-10-02 MED ORDER — ONDANSETRON HCL 4 MG PO TABS: 4.0000 mg | ORAL_TABLET | Freq: Four times a day (QID) | ORAL | 1 refills | Status: DC | PRN

## 2017-10-02 NOTE — Telephone Encounter (Signed)
Daughter called reporting that patient is having nausea when getting up some mornings. Asking if we can order her some medicine for it, she uses [facility]

## 2017-10-02 NOTE — Telephone Encounter (Signed)
Lets try Incruse

## 2017-10-02 NOTE — Telephone Encounter (Signed)
Patient daughter calling to check on status Please call

## 2017-10-02 NOTE — Telephone Encounter (Signed)
Can you please prescribe zofran 4 mg q6 prn for her? Thanks, Astrid Divine

## 2017-10-02 NOTE — Telephone Encounter (Signed)
Prescription sent to pharmacy and daughter informed

## 2017-10-02 NOTE — Telephone Encounter (Signed)
Pt c/o medication issue:  1. Name of Medication: Spiriva  2. How are you currently taking this medication (dosage and times per day)? 1.25 mcg aers inh 2 puffs q d   3. Are you having a reaction (difficulty breathing--STAT)? Not sure   4. What is your medication issue? Patient is having anxiety and panic attacks everyday now since starting daughter has asked patient not to take today to see if this eliminates her anxiety.  Would Dr. Mortimer Fries like to try an alternate. Please call.

## 2017-10-03 ENCOUNTER — Inpatient Hospital Stay: Payer: PPO

## 2017-10-03 DIAGNOSIS — C782 Secondary malignant neoplasm of pleura: Secondary | ICD-10-CM | POA: Diagnosis not present

## 2017-10-03 DIAGNOSIS — C50919 Malignant neoplasm of unspecified site of unspecified female breast: Secondary | ICD-10-CM

## 2017-10-03 LAB — CBC WITH DIFFERENTIAL/PLATELET
BASOS ABS: 0 10*3/uL (ref 0–0.1)
BASOS PCT: 1 %
EOS ABS: 0 10*3/uL (ref 0–0.7)
Eosinophils Relative: 1 %
HCT: 31.9 % — ABNORMAL LOW (ref 35.0–47.0)
HEMOGLOBIN: 10.8 g/dL — AB (ref 12.0–16.0)
Lymphocytes Relative: 51 %
Lymphs Abs: 1.9 10*3/uL (ref 1.0–3.6)
MCH: 29.9 pg (ref 26.0–34.0)
MCHC: 33.9 g/dL (ref 32.0–36.0)
MCV: 88.2 fL (ref 80.0–100.0)
MONOS PCT: 5 %
Monocytes Absolute: 0.2 10*3/uL (ref 0.2–0.9)
Neutro Abs: 1.6 10*3/uL (ref 1.4–6.5)
Neutrophils Relative %: 42 %
Platelets: 222 10*3/uL (ref 150–440)
RBC: 3.62 MIL/uL — AB (ref 3.80–5.20)
RDW: 13.6 % (ref 11.5–14.5)
WBC: 3.8 10*3/uL (ref 3.6–11.0)

## 2017-10-03 LAB — COMPREHENSIVE METABOLIC PANEL
ALT: 13 U/L (ref 0–44)
AST: 21 U/L (ref 15–41)
Albumin: 3.6 g/dL (ref 3.5–5.0)
Alkaline Phosphatase: 63 U/L (ref 38–126)
Anion gap: 9 (ref 5–15)
BUN: 14 mg/dL (ref 8–23)
CHLORIDE: 105 mmol/L (ref 98–111)
CO2: 23 mmol/L (ref 22–32)
Calcium: 8.3 mg/dL — ABNORMAL LOW (ref 8.9–10.3)
Creatinine, Ser: 0.81 mg/dL (ref 0.44–1.00)
GFR calc Af Amer: >60 mL/min (ref >60)
GFR calc non Af Amer: >60 mL/min (ref >60)
GLUCOSE: 100 mg/dL — AB (ref 70–99)
Potassium: 3.7 mmol/L (ref 3.5–5.1)
SODIUM: 137 mmol/L (ref 135–145)
Total Bilirubin: 0.5 mg/dL (ref 0.3–1.2)
Total Protein: 6.7 g/dL (ref 6.5–8.1)

## 2017-10-03 NOTE — Telephone Encounter (Signed)
Returned call to patient's daughter. Apologized for delay in returning her call. She was told that calls were returned in the order of importance but the Dr. Had responded to her message. She called back again and was informed that the Dr. Had not repsonded to her message. She was not happy with what she was told by receptionist in which made them feel that that were not important to the physician. Explained that was not the case and apologized again.  She is aware that Incruse has been sent to the pharmacy. She states her mother is recovering from surgery and chemo therefore is anxious. She is scheduled for a SMW on Friday in which they may cancel. Explained the importance of SMW and what Dr. Mortimer Fries is checking for. Also, made aware if she decided not to proceed with inhaler to let the office know and Dr. Mortimer Fries may change to nebs.  Nothing further needed.

## 2017-10-04 ENCOUNTER — Telehealth: Payer: Self-pay

## 2017-10-04 DIAGNOSIS — R0602 Shortness of breath: Secondary | ICD-10-CM | POA: Diagnosis not present

## 2017-10-04 NOTE — Telephone Encounter (Signed)
Spoke with Ms. Gendron daugther ( linda ). Ms. [name] left a message stating her mother needed a ECG. She states she was referred from Dr.Oaks office to Pulmonology ( MD Flora Lipps) and the visit didn't go very well as planned. The patient reports all the appointments was can. by the pulmonology office and Ms [name] was not pleased and still request that Ms. Lovan get an ECG. Per Dr Janese Banks the patient is schedule for 10/18/17 and will follow up with her then and see if she need a EKG at that time. Ms. [name] was very understanding and agreed to keep schedule appointment.

## 2017-10-05 ENCOUNTER — Ambulatory Visit: Payer: PPO

## 2017-10-05 DIAGNOSIS — H353211 Exudative age-related macular degeneration, right eye, with active choroidal neovascularization: Secondary | ICD-10-CM | POA: Diagnosis not present

## 2017-10-10 DIAGNOSIS — H903 Sensorineural hearing loss, bilateral: Secondary | ICD-10-CM | POA: Diagnosis not present

## 2017-10-11 NOTE — Telephone Encounter (Signed)
Oral Chemotherapy Pharmacist Encounter  Patient Education I spoke with patient on 09/18/17 for overview of new oral chemotherapy medication: Ibrance (palbociclib) for the treatment of metastatic breast cancer, planned duration until disease progression or unacceptable drug toxicity.   Counseled patient on administration, dosing, side effects, monitoring, drug-food interactions, safe handling, storage, and disposal. Patient will take 1 capsule (100 mg total) by mouth daily with breakfast. Take whole with food. Take for 21 days on, 7 days off, repeat Q28 days.  Side effects include but not limited to: decreased wbc/hgb/plt, fatigue, N/V, alopecia.   Reviewed with patient importance of keeping a medication schedule and plan for any missed doses.  Pt voiced understanding and appreciation. All questions answered. Medication handout provided.  Provided patient with [facility] phone number. Patient knows to call the office with questions or concerns. Oral Chemotherapy Navigation Clinic will continue to follow.  Darl Pikes, PharmD, BCPS, BCOP Hematology/Oncology Clinical Pharmacist [facility] [place] [facility] [phone]

## 2017-10-12 DIAGNOSIS — H353221 Exudative age-related macular degeneration, left eye, with active choroidal neovascularization: Secondary | ICD-10-CM | POA: Diagnosis not present

## 2017-10-15 MED FILL — IBRANCE 100 MG CAPSULE: 100 | 28 days supply | Qty: 21 | Fill #1

## 2017-10-18 ENCOUNTER — Encounter: Payer: Self-pay | Admitting: Oncology

## 2017-10-18 ENCOUNTER — Inpatient Hospital Stay: Payer: PPO | Attending: Oncology | Admitting: Oncology

## 2017-10-18 ENCOUNTER — Inpatient Hospital Stay: Payer: PPO

## 2017-10-18 ENCOUNTER — Other Ambulatory Visit: Payer: Self-pay

## 2017-10-18 VITALS — BP 131/75 | HR 84 | Temp 97.3°F | Resp 18 | Ht 66.0 in | Wt 174.0 lb

## 2017-10-18 DIAGNOSIS — Z79899 Other long term (current) drug therapy: Secondary | ICD-10-CM | POA: Insufficient documentation

## 2017-10-18 DIAGNOSIS — Z9011 Acquired absence of right breast and nipple: Secondary | ICD-10-CM | POA: Insufficient documentation

## 2017-10-18 DIAGNOSIS — C782 Secondary malignant neoplasm of pleura: Secondary | ICD-10-CM | POA: Diagnosis not present

## 2017-10-18 DIAGNOSIS — F4310 Post-traumatic stress disorder, unspecified: Secondary | ICD-10-CM | POA: Diagnosis not present

## 2017-10-18 DIAGNOSIS — M545 Low back pain: Secondary | ICD-10-CM | POA: Insufficient documentation

## 2017-10-18 DIAGNOSIS — Z170 Estrogen receptor positive status [ER+]: Secondary | ICD-10-CM | POA: Insufficient documentation

## 2017-10-18 DIAGNOSIS — Z9223 Personal history of estrogen therapy: Secondary | ICD-10-CM

## 2017-10-18 DIAGNOSIS — Z9221 Personal history of antineoplastic chemotherapy: Secondary | ICD-10-CM | POA: Insufficient documentation

## 2017-10-18 DIAGNOSIS — Z853 Personal history of malignant neoplasm of breast: Secondary | ICD-10-CM | POA: Diagnosis not present

## 2017-10-18 DIAGNOSIS — C50912 Malignant neoplasm of unspecified site of left female breast: Secondary | ICD-10-CM

## 2017-10-18 DIAGNOSIS — J90 Pleural effusion, not elsewhere classified: Secondary | ICD-10-CM | POA: Diagnosis not present

## 2017-10-18 DIAGNOSIS — C50919 Malignant neoplasm of unspecified site of unspecified female breast: Secondary | ICD-10-CM

## 2017-10-18 LAB — CBC WITH DIFFERENTIAL/PLATELET
BASOS ABS: 0 10*3/uL (ref 0–0.1)
Basophils Relative: 1 %
EOS ABS: 0 10*3/uL (ref 0–0.7)
EOS PCT: 1 %
HCT: 33.5 % — ABNORMAL LOW (ref 35.0–47.0)
Hemoglobin: 11.4 g/dL — ABNORMAL LOW (ref 12.0–16.0)
Lymphocytes Relative: 49 %
Lymphs Abs: 1.9 10*3/uL (ref 1.0–3.6)
MCH: 30.8 pg (ref 26.0–34.0)
MCHC: 34 g/dL (ref 32.0–36.0)
MCV: 90.6 fL (ref 80.0–100.0)
Monocytes Absolute: 0.6 10*3/uL (ref 0.2–0.9)
Monocytes Relative: 15 %
Neutro Abs: 1.3 10*3/uL — ABNORMAL LOW (ref 1.4–6.5)
Neutrophils Relative %: 34 %
PLATELETS: 213 10*3/uL (ref 150–440)
RBC: 3.7 MIL/uL — AB (ref 3.80–5.20)
RDW: 17.7 % — ABNORMAL HIGH (ref 11.5–14.5)
WBC: 3.9 10*3/uL (ref 3.6–11.0)

## 2017-10-18 LAB — COMPREHENSIVE METABOLIC PANEL
ALK PHOS: 54 U/L (ref 38–126)
ALT: 13 U/L (ref 0–44)
AST: 22 U/L (ref 15–41)
Albumin: 3.9 g/dL (ref 3.5–5.0)
Anion gap: 9 (ref 5–15)
BUN: 13 mg/dL (ref 8–23)
CALCIUM: 8.9 mg/dL (ref 8.9–10.3)
CO2: 25 mmol/L (ref 22–32)
CREATININE: 0.82 mg/dL (ref 0.44–1.00)
Chloride: 105 mmol/L (ref 98–111)
GFR calc Af Amer: >60 mL/min (ref >60)
Glucose, Bld: 96 mg/dL (ref 70–99)
Potassium: 3.9 mmol/L (ref 3.5–5.1)
Sodium: 139 mmol/L (ref 135–145)
Total Bilirubin: 0.4 mg/dL (ref 0.3–1.2)
Total Protein: 7.1 g/dL (ref 6.5–8.1)

## 2017-10-19 NOTE — Progress Notes (Signed)
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 She underwent mastectomy followed by adjuvant chemotherapy and 5 years of tamoxifen. Patient has been having some low back pain and underwent a CT lumbar spine without contrast on 08/10/2017 which was done by the pain clinic. CT mainly showed age-related degenerative disease but also showed incidental nodular thickening of the posterior left diaphragm concerning for tumor and a CT chest was recommended.  CT chest on 08/14/2017 showed scattered left pleural nodularity with tiny left pleural effusion and prominent left internal mammary and juxtadiaphragmatic lymph nodes which are nonspecific but metastatic disease could not be ruled out.  This was followed by a PET/CT scan on 08/22/2017 which showed numerous small left-sided pleural nodules which were mildly hypermetabolic along with hypermetabolic mediastinal lymph nodes concerning for metastatic disease. Small metastatic nodule in the left upper quadrant partly in the retrocrural space. No findings of pulmonary metastatic or osseous metastatic disease.   Patient underwent FNA of retrocrural LN which was positive for carcinoma but primary could not be ascertained. She then underwent pleural biopsy which showed metastatic carcinoma consistent with breast primary. ER+ HER-2  Interval history- she felt fatigued while she was on ibrance but was still able to carry on with  her ADL's. Denies any pain. She also had mild self limited nausea which has resolved. She continues to have sob on exertion. She was started on steroid based inhaler by Dr. Mortimer Fries. She did not like the side effects of steroids and stopped the inhaler. She wishes to see an alternative provider for her pulmonary issues  ECOG PS- 2 Pain scale- 0 Opioid associated constipation- no  Review of systems- Review of Systems  Constitutional: Positive for malaise/fatigue. Negative for chills, fever and weight loss.  HENT: Negative for congestion, ear discharge and nosebleeds.   Eyes: Negative for blurred vision.  Respiratory: Negative for cough, hemoptysis, sputum production, shortness of breath and wheezing.   Cardiovascular: Negative for chest pain, palpitations, orthopnea and claudication.  Gastrointestinal: Negative for abdominal pain, blood in stool, constipation, diarrhea, heartburn, melena, nausea and vomiting.  Genitourinary: Negative for dysuria, flank pain, frequency, hematuria and urgency.  Musculoskeletal: Negative for back pain, joint pain and myalgias.  Skin: Negative for rash.  Neurological: Negative for dizziness, tingling, focal weakness, seizures, weakness and headaches.  Endo/Heme/Allergies: Does not bruise/bleed easily.  Psychiatric/Behavioral: Negative for depression and suicidal ideas. The patient does not have insomnia.       Allergies  Allergen Reactions  . Imipramine Tinitus  . Latex Rash  . Penicillins Rash  . Tape Rash     Past Medical History:  Diagnosis Date  . Anxiety   . Atrial fibrillation ([place])   . Breast cancer ([place]) 1999   RT MASTECTOMY  . DDD (degenerative disc disease), cervical   . Depression   . Diverticulosis   . Diverticulosis   . DVT of lower extremity (deep venous thrombosis) ([place])   . Dyspnea   . GERD (gastroesophageal reflux disease)   .  Goiter   . Headache   . History of chemotherapy 2000   BREAST CA  . Hypertension   . Incontinence     . OSA (obstructive sleep apnea)   . Osteoporosis   . Personal history of chemotherapy 1999   BREAST CA  . Status post chemotherapy    2000 right breast cancer  . Thyroid disease   . UTI (urinary tract infection)   . Vertigo      Past Surgical History:  Procedure Laterality Date  . ABDOMINAL HYSTERECTOMY    . BREAST BIOPSY Left 2006   negative  . CHOLECYSTECTOMY    . COCHLEAR IMPLANT Right   . COLONOSCOPY WITH PROPOFOL N/A 07/24/2016   Procedure: COLONOSCOPY WITH PROPOFOL;  Surgeon: Manya Silvas, MD;  [facility] END[facility];  Service: Endoscopy;  Laterality: N/A;  . ESOPHAGOGASTRODUODENOSCOPY (EGD) WITH PROPOFOL N/A 04/12/2016   Procedure: ESOPHAGOGASTRODUODENOSCOPY (EGD) WITH PROPOFOL;  Surgeon: Manya Silvas, MD;  Location: [facility] ENDOSCOPY;  Service: Endoscopy;  Laterality: N/A;  . EYE SURGERY Bilateral    cataract  . FOOT SURGERY Bilateral   . HAND SURGERY Right   . MASTECTOMY Right 1999   BREAST CA  . MASTECTOMY Right 1999  . PARTIAL COLECTOMY    . THYROIDECTOMY    . VIDEO ASSISTED THORACOSCOPY Left 09/10/2017   Procedure: VIDEO ASSISTED THORACOSCOPY;  Surgeon: [name], MD;  Location: [facility] ORS;  Service: Thoracic;  Laterality: Left;  with biopsies  . VIDEO BRONCHOSCOPY  09/10/2017   Procedure: VIDEO BRONCHOSCOPY;  Surgeon: [name], MD;  Location: [facility] ORS;  Service: Thoracic;;    Social History   Socioeconomic History  . Marital status: Married    Spouse name: evert  . Number of children: 4  . Years of education: Not on file  . Highest education level: 9th grade  Occupational History  . Not on file  Social Needs  . Financial resource strain: Somewhat hard  . Food insecurity:    Worry: Often true    Inability: Often true  . Transportation needs:    Medical: No    Non-medical: No  Tobacco Use  . Smoking status: Never Smoker  . Smokeless tobacco: Never Used  Substance and Sexual Activity  . Alcohol use: No    Alcohol/week: 0.0 standard  drinks  . Drug use: No  . Sexual activity: Never  Lifestyle  . Physical activity:    Days per week: 0 days    Minutes per session: 0 min  . Stress: Not at all  Relationships  . Social connections:    Talks on phone: Not on file    Gets together: Not on file    Attends religious service: More than 4 times per year    Active member of club or organization: No    Attends meetings of clubs or organizations: Never    Relationship status: Married  . Intimate partner violence:    Fear of current or ex partner: No    Emotionally abused: No    Physically abused: No    Forced sexual activity: No  Other Topics Concern  . Not on file  Social History Narrative  . Not on file    Family History  Problem Relation Age of Onset  . Dementia Mother   . Dementia Sister   . Diabetes Sister   . Heart attack Brother   . COPD Brother   . Colon cancer Brother   . Liver cancer Brother   . COPD Brother   .  Breast cancer Neg Hx      Current Outpatient Medications:  .  atorvastatin (LIPITOR) 40 MG tablet, Take 40 mg by mouth at bedtime., Disp: , Rfl:  .  cholecalciferol (VITAMIN D) 1000 units tablet, Take 1,000 Units by mouth daily., Disp: , Rfl:  .  cyanocobalamin (V-R VITAMIN B-12) 500 MCG tablet, Take 500 mcg by mouth daily. , Disp: , Rfl:  .  DULoxetine (CYMBALTA) 60 MG capsule, Take 1 capsule (60 mg total) by mouth every morning., Disp: 90 capsule, Rfl: 1 .  letrozole (FEMARA) 2.5 MG tablet, Take 1 tablet (2.5 mg total) by mouth daily., Disp: 30 tablet, Rfl: 5 .  palbociclib (IBRANCE) 100 MG capsule, Take 1 capsule (100 mg total) by mouth daily with breakfast. Take whole with food. Take for 21 days on, 7 days off, repeat Q28 days., Disp: 21 capsule, Rfl: 5 .  pantoprazole (PROTONIX) 40 MG tablet, Take 40 mg by mouth 2 (two) times daily. , Disp: , Rfl: 11 .  SYNTHROID 88 MCG tablet, Take 88 mcg by mouth daily., Disp: , Rfl: 11 .  ondansetron (ZOFRAN) 4 MG tablet, Take 1 tablet (4 mg total) by  mouth every 6 (six) hours as needed for nausea or vomiting. (Patient not taking: Reported on 10/18/2017), Disp: 30 tablet, Rfl: 1 .  oxyCODONE-acetaminophen (PERCOCET/ROXICET) 5-325 MG tablet, Take 1 tablet by mouth every 6 (six) hours as needed for severe pain. (Patient not taking: Reported on 10/18/2017), Disp: 30 tablet, Rfl: 0 .  Tiotropium Bromide Monohydrate (SPIRIVA RESPIMAT) 1.25 MCG/ACT AERS, Inhale 2 puffs into the lungs daily. (Patient not taking: Reported on 10/18/2017), Disp: 1 Inhaler, Rfl: 5 .  traZODone (DESYREL) 100 MG tablet, Take 1 tablet (100 mg total) by mouth at bedtime as needed for sleep. (Patient not taking: Reported on 10/18/2017), Disp: 90 tablet, Rfl: 1  Physical exam:  Vitals:   10/18/17 1432  BP: 131/75  Pulse: 84  Resp: 18  Temp: (!) 97.3 F (36.3 C)  TempSrc: Tympanic  SpO2: 97%  Weight: 174 lb (78.9 kg)  Height: 5' 6"  (1.676 m)   Physical Exam  Constitutional: She is oriented to person, place, and time. She appears well-developed and well-nourished.  She is elderly. Appears fatigued  HENT:  Head: Normocephalic and atraumatic.  Eyes: Pupils are equal, round, and reactive to light. EOM are normal.  Neck: Normal range of motion.  Cardiovascular: Normal rate, regular rhythm and normal heart sounds.  Pulmonary/Chest: Effort normal and breath sounds normal.  Abdominal: Soft. Bowel sounds are normal.  Neurological: She is alert and oriented to person, place, and time.  Skin: Skin is warm and dry.     CMP Latest Ref Rng & Units 10/18/2017  Glucose 70 - 99 mg/dL 96  BUN 8 - 23 mg/dL 13  Creatinine 0.44 - 1.00 mg/dL 0.82  Sodium 135 - 145 mmol/L 139  Potassium 3.5 - 5.1 mmol/L 3.9  Chloride 98 - 111 mmol/L 105  CO2 22 - 32 mmol/L 25  Calcium 8.9 - 10.3 mg/dL 8.9  Total Protein 6.5 - 8.1 g/dL 7.1  Total Bilirubin 0.3 - 1.2 mg/dL 0.4  Alkaline Phos 38 - 126 U/L 54  AST 15 - 41 U/L 22  ALT 0 - 44 U/L 13   CBC Latest Ref Rng & Units 10/18/2017  WBC 3.6 - 11.0  K/uL 3.9  Hemoglobin 12.0 - 16.0 g/dL 11.4(L)  Hematocrit 35.0 - 47.0 % 33.5(L)  Platelets 150 - 440 K/uL 213  No images are attached to the encounter.  Dg Chest 2 View  Result Date: 09/26/2017 CLINICAL DATA:  Lung cancer, breast cancer, follow-up EXAM: CHEST - 2 VIEW COMPARISON:  09/11/2017 FINDINGS: Normal heart size, mediastinal contours, and pulmonary vascularity. Eventration of the RIGHT diaphragm again seen. Minimal effusion or scarring at lateral LEFT base. Mild hyperinflation. No acute infiltrate or pneumothorax. Bones appear demineralized. Post RIGHT mastectomy with surgical clips RIGHT axilla. IMPRESSION: Minimal effusion or scarring at lateral LEFT lung base. No acute abnormalities. Electronically Signed   By: Lavonia Dana M.D.   On: 09/26/2017 08:22   Dg Bone Density  Result Date: 09/24/2017 EXAM: DUAL X-RAY ABSORPTIOMETRY (DXA) FOR BONE MINERAL DENSITY IMPRESSION: Dear Dr Randa Evens, Your patient Haylie Mccutcheon completed a BMD test on 09/24/2017 using the Trent (analysis version: 14.10) manufactured by [employer]. The following summarizes the results of our evaluation. PATIENT BIOGRAPHICAL: Name: Kinjal, Neitzke Patient ID: [id number] Birth Date: [birth year] Height: 66.0 in. Gender: Female Exam Date: 09/24/2017 Weight: 162.4 lbs. Indications: Advanced Age, Breast ca, Caucasian, high risk meds, History of Fracture, hx breast ca, Hysterectomy, POSTmenopausal Fractures: clavicle Treatments: ibrance, letrozole, synthroid, Vitamin D ASSESSMENT: The BMD measured at AP Spine L1-L4 (L3) is 0.958 g/cm2 with a T-score of -1.8. This patient is considered osteopenic according to [facility]) criteria. L-3 was excluded due to degenerative changes. Scan quality is limited by L-3 exclusion. Site Region Measured Measured WHO Young Adult BMD Date       Age      Classification T-score AP Spine L1-L4 (L3) 09/24/2017 77.0 Osteopenia -1.8 0.958 g/cm2 DualFemur  Total Left 09/24/2017 77.0 Osteopenia -1.3 0.846 g/cm2 Left Forearm Radius 33% 09/24/2017 77.0 Osteopenia -1.1 0.778 g/cm2 World Health Organization [facility]) criteria for post-menopausal, Caucasian Women: Normal:       T-score at or above -1 SD Osteopenia:   T-score between -1 and -2.5 SD Osteoporosis: T-score at or below -2.5 SD RECOMMENDATIONS: 1. All patients should optimize calcium and vitamin D intake. 2. Consider FDA-approved medical therapies in postmenopausal women and men aged 98 years and older, based on the following: a. A hip or vertebral (clinical or morphometric) fracture b. T-score < -2.5 at the femoral neck or spine after appropriate evaluation to exclude secondary causes c. Low bone mass (T-score between -1.0 and -2.5 at the femoral neck or spine) and a 10-year probability of a hip fracture > 3% or a 10-year probability of a major osteoporosis-related fracture > 20% based on the [place]-adapted WHO algorithm d. Clinician judgment and/or patient preferences may indicate treatment for people with 10-year fracture probabilities above or below these levels FOLLOW-UP: Patients with diagnosed cases of osteoporosis or at high risk for fracture should have regular bone mineral density tests. For patients eligible for Medicare, routine testing is allowed once every 2 years. The testing frequency can be increased to one year for patients who have rapidly progressing disease, those who are receiving or discontinuing medical therapy to restore bone mass, or have additional risk factors. FRAX* RESULTS:  (version: 3.5) 10-year Probability of Fracture1 Major Osteoporotic Fracture2 Hip Fracture 10.5% 1.7% Population: [place] (Caucasian) Risk Factors: None Based on Femur (Right) Neck BMD 1 -The 10-year probability of fracture may be lower than reported if the patient has received treatment. 2 -Major Osteoporotic Fracture: Clinical Spine, Forearm, Hip or Shoulder *FRAX is a Materials engineer of the [employer] for Metabolic Bone Disease, a World Pharmacologist (WHO) Collaborating  Centre. ASSESSMENT: The probability of a major osteoporotic fracture is 10.5% within the next ten years. The probability of a hip fracture is 1.7% within the next ten years. Electronically Signed   By: Earle Gell M.D.   On: 09/24/2017 15:39     Assessment and plan- Patient is a 77 y.o. 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 Low volume disease. She is currently on ibrance and has started her 2nd cycle  Counts are okay to proceed and continue with cycle 2 of Ibrance today.  We will get repeat CBC and CMP on 10/31/2017 at 2 weeks cycle 2 of Ibrance.  I will see her back on 11/13/2017 with CBC CMP CA-27-29 and CA-15-3 prior to starting cycle 3 of Ibrance 3 weeks on 1 week on on 11/14/2017.  Exertional shortness of breath: She was seen by Dr. Mortimer Fries recently but wishes to see an alternative provider.  I will refer her to Dr. Raul Del at this time    Visit Diagnosis 1. Malignant neoplasm of left female breast, unspecified estrogen receptor status, unspecified site of breast ([place])   2. High risk medication use      Dr. Randa Evens, MD, MPH [facility] at [facility] [phone] 10/19/2017 10:25 AM

## 2017-10-25 ENCOUNTER — Ambulatory Visit: Payer: PPO

## 2017-10-29 ENCOUNTER — Ambulatory Visit: Payer: PPO | Admitting: Internal Medicine

## 2017-10-29 DIAGNOSIS — C50919 Malignant neoplasm of unspecified site of unspecified female breast: Secondary | ICD-10-CM | POA: Diagnosis not present

## 2017-10-29 DIAGNOSIS — C782 Secondary malignant neoplasm of pleura: Secondary | ICD-10-CM | POA: Diagnosis not present

## 2017-10-30 ENCOUNTER — Inpatient Hospital Stay (HOSPITAL_BASED_OUTPATIENT_CLINIC_OR_DEPARTMENT_OTHER): Payer: PPO | Admitting: Nurse Practitioner

## 2017-10-30 ENCOUNTER — Ambulatory Visit
Admission: RE | Admit: 2017-10-30 | Discharge: 2017-10-30 | Disposition: A | Discharge status: Home / Self Care | Payer: PPO | Source: Ambulatory Visit | Attending: Nurse Practitioner | Admitting: Nurse Practitioner

## 2017-10-30 ENCOUNTER — Inpatient Hospital Stay: Payer: PPO

## 2017-10-30 ENCOUNTER — Encounter: Payer: Self-pay | Admitting: Nurse Practitioner

## 2017-10-30 ENCOUNTER — Telehealth: Payer: Self-pay | Admitting: *Deleted

## 2017-10-30 VITALS — BP 130/81 | HR 79 | Resp 22 | Wt 164.8 lb

## 2017-10-30 DIAGNOSIS — Z79899 Other long term (current) drug therapy: Secondary | ICD-10-CM | POA: Diagnosis not present

## 2017-10-30 DIAGNOSIS — Z9011 Acquired absence of right breast and nipple: Secondary | ICD-10-CM

## 2017-10-30 DIAGNOSIS — F411 Generalized anxiety disorder: Secondary | ICD-10-CM

## 2017-10-30 DIAGNOSIS — C50919 Malignant neoplasm of unspecified site of unspecified female breast: Secondary | ICD-10-CM

## 2017-10-30 DIAGNOSIS — Z853 Personal history of malignant neoplasm of breast: Secondary | ICD-10-CM

## 2017-10-30 DIAGNOSIS — Z9223 Personal history of estrogen therapy: Secondary | ICD-10-CM

## 2017-10-30 DIAGNOSIS — Z9221 Personal history of antineoplastic chemotherapy: Secondary | ICD-10-CM | POA: Diagnosis not present

## 2017-10-30 DIAGNOSIS — M545 Low back pain: Secondary | ICD-10-CM | POA: Diagnosis not present

## 2017-10-30 DIAGNOSIS — C801 Malignant (primary) neoplasm, unspecified: Secondary | ICD-10-CM

## 2017-10-30 DIAGNOSIS — R0781 Pleurodynia: Secondary | ICD-10-CM

## 2017-10-30 DIAGNOSIS — R0602 Shortness of breath: Secondary | ICD-10-CM

## 2017-10-30 DIAGNOSIS — J90 Pleural effusion, not elsewhere classified: Secondary | ICD-10-CM

## 2017-10-30 DIAGNOSIS — F418 Other specified anxiety disorders: Secondary | ICD-10-CM

## 2017-10-30 DIAGNOSIS — Z170 Estrogen receptor positive status [ER+]: Secondary | ICD-10-CM

## 2017-10-30 DIAGNOSIS — F4310 Post-traumatic stress disorder, unspecified: Secondary | ICD-10-CM

## 2017-10-30 DIAGNOSIS — R0789 Other chest pain: Secondary | ICD-10-CM

## 2017-10-30 DIAGNOSIS — C782 Secondary malignant neoplasm of pleura: Secondary | ICD-10-CM | POA: Diagnosis not present

## 2017-10-30 LAB — CBC WITH DIFFERENTIAL/PLATELET
BASOS PCT: 2 %
Basophils Absolute: 0.1 10*3/uL (ref 0–0.1)
EOS ABS: 0 10*3/uL (ref 0–0.7)
EOS PCT: 1 %
HCT: 34.5 % — ABNORMAL LOW (ref 35.0–47.0)
HEMOGLOBIN: 11.7 g/dL — AB (ref 12.0–16.0)
LYMPHS ABS: 2.1 10*3/uL (ref 1.0–3.6)
Lymphocytes Relative: 58 %
MCH: 31.2 pg (ref 26.0–34.0)
MCHC: 33.9 g/dL (ref 32.0–36.0)
MCV: 92.1 fL (ref 80.0–100.0)
MONOS PCT: 6 %
Monocytes Absolute: 0.2 10*3/uL (ref 0.2–0.9)
NEUTROS PCT: 33 %
Neutro Abs: 1.2 10*3/uL — ABNORMAL LOW (ref 1.4–6.5)
Platelets: 327 10*3/uL (ref 150–440)
RBC: 3.75 MIL/uL — ABNORMAL LOW (ref 3.80–5.20)
RDW: 18.7 % — ABNORMAL HIGH (ref 11.5–14.5)
WBC: 3.7 10*3/uL (ref 3.6–11.0)

## 2017-10-30 LAB — COMPREHENSIVE METABOLIC PANEL
ALK PHOS: 50 U/L (ref 38–126)
ALT: 12 U/L (ref 0–44)
AST: 20 U/L (ref 15–41)
Albumin: 3.9 g/dL (ref 3.5–5.0)
Anion gap: 8 (ref 5–15)
BUN: 12 mg/dL (ref 8–23)
CALCIUM: 8.8 mg/dL — AB (ref 8.9–10.3)
CHLORIDE: 106 mmol/L (ref 98–111)
CO2: 25 mmol/L (ref 22–32)
CREATININE: 0.91 mg/dL (ref 0.44–1.00)
GFR, EST NON AFRICAN AMERICAN: 59 mL/min — AB (ref >60)
Glucose, Bld: 106 mg/dL — ABNORMAL HIGH (ref 70–99)
Potassium: 4.1 mmol/L (ref 3.5–5.1)
SODIUM: 139 mmol/L (ref 135–145)
Total Bilirubin: 0.9 mg/dL (ref 0.3–1.2)
Total Protein: 7 g/dL (ref 6.5–8.1)

## 2017-10-30 MED ORDER — OXYCODONE HCL 5 MG PO TABS: 5.0000 mg | ORAL_TABLET | Freq: Four times a day (QID) | ORAL | 0 refills | Status: DC | PRN

## 2017-10-30 MED ORDER — DIAZEPAM 5 MG PO TABS: 5.0000 mg | ORAL_TABLET | Freq: Two times a day (BID) | ORAL | 0 refills | Status: DC | PRN

## 2017-10-30 NOTE — Telephone Encounter (Signed)
Called reporting that patient is having pain under her left ribs and complaining of shortness of breath. Her O2 sat is 98%. She asks if she needs to be evaluated or what to do. Discussed with NP in [facility] Management [facility]. Appointment made for this afternoon accepted

## 2017-10-30 NOTE — Progress Notes (Signed)
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  She underwent mastectomy followed by adjuvant chemotherapy and 5 years of tamoxifen. She was experiencing low back pain and CT imaging was ordered by pain clinic for further evaluation.     08/10/2017 Imaging    08/10/17- CT Lumbar Spine WO Contrast IMPRESSION: 1. Mild for age degenerative disease as described. The canal and foramina appear diffusely patent. No noted progression when compared to 2013. 2. Nodular thickening of the posterior left diaphragm primarily concerning for tumor. Recommend chest CT with contrast.    08/14/2017 Imaging    CT chest w contrast 08/14/2017 IMPRESSION: 1. Scattered mild LEFT pleural nodularity with tiny LEFT pleural effusion and prominent LEFT internal mammary and LEFT juxta diaphragmatic lymph nodes, nonspecific but may represent malignancy/metastatic disease. Consider tissue sampling and/or PET-CT. 2. Small hiatal hernia 3.  Aortic Atherosclerosis (ICD10-I70.0).    08/22/2017 PET scan    08/22/17- PET IMPRESSION: 1. Numerous small left-sided pleural nodules and small but hypermetabolic mediastinal lymph nodes, worrisome for metastatic breast cancer. 2. Small metastatic nodule in the left upper quadrant  partly in the retrocrural space. 3. No findings for pulmonary metastatic disease or osseous metastatic disease.    09/04/2017 Initial Biopsy    09/04/17- CT guided FNA of the left retrocrural soft tissue.  Core biopsy could not be performed due to adjacent vascular structures.    09/10/2017 Pathology Results    09/10/17- Pathology: -Metastatic carcinoma consistent with breast origin.  ER positive (> 90%)    09/17/2017 Initial Diagnosis    Metastatic breast cancer ([place])    09/17/2017 -  Chemotherapy    The patient had [No matching medication found in this treatment plan]  for chemotherapy treatment.     09/17/2017 -  Chemotherapy    She initiated Ibrance and letrozole on 09/17/2017.    09/24/2017 Imaging    09/24/2017-bone density T score -1.8 at [facility] considered osteopenic.     History of Presenting Illness- Breanna Zhang is a 77 y.o. female who presents with a report of left chest wall pain at the site of prior chest tube.  She has had this pain for 1 to 2 months but feels it is progressively worsening and occurring more frequently.  Associated signs and symptoms: Shortness of breath with exertion, poor sleep, anxiety, fatigue, worry (significant).  She rates her pain 5/10 at baseline and feels at its worst it is a 7/10.  She describes pain as soreness with sharp episodes at times.  Pain is made better with rest.  Pain is made worse with deep breaths, stressful events.  She states that sometimes she wakes up at night and pulls her CPAP off feeling she cannot breathe. She reports history of anxiety, depression, and PTSD.  She is followed by Dr. Gretel Acre. Currently on Cymbalta,  Buspar, trazodone.  She was prescribed Valium from her PCP for back pain and anxiety which she has been taking recently and somewhat improves her symptoms but does not resolve.  She was previously prescribed Percocet for pain which she has not been taking recently.  She denies having tried taking any over-the-counter  medications.  She previously saw Dr. Mortimer Fries, pulmonology, for this pain.  She was prescribed Spiriva Respimat for shortness of breath.  Chest x-ray on 09/25/2017 showed minimal effusion or scarring lateral left lung base, negative for acute abnormalities, pneumothorax, infection.  It was recommended that she had 6-minute walk test, overnight pulse ox, pulmonary function testing, and echocardiogram.  She was intolerant to Spiriva stating that it made her anxiety and sob worse.  It was recommended that she switch to Incruse or nebulizer treatments. Per note, he suspected underlying copd or emphysema d/t history of working in [employer].   She is accompanied today by her daughter, [name] who also contributes to history and prior work-up.  Per daughter, she feels anxiety significantly contributes to presenting symptoms.   ECOG FS:1 - Symptomatic but completely ambulatory  Review of systems- Review of Systems  Constitutional: Positive for malaise/fatigue and weight loss. Negative for chills and fever.  HENT: Negative for congestion, ear discharge, ear pain, sinus pain, sore throat and tinnitus.   Eyes: Negative.   Respiratory: Negative for cough, sputum production and shortness of breath (not currently).   Cardiovascular: Negative for chest pain, palpitations, orthopnea, claudication and leg swelling.  Gastrointestinal: Positive for constipation (intermittent; currently well controlled). Negative for abdominal pain, blood in stool, diarrhea, heartburn, nausea and vomiting.  Genitourinary: Negative.   Musculoskeletal: Negative.        Cane for ambulation  Skin: Negative.   Neurological: Negative for dizziness, tingling, weakness and headaches.  Endo/Heme/Allergies: Negative.   Psychiatric/Behavioral: The patient is nervous/anxious.     Current treatment- Ibrance & Letrozole  Allergies  Allergen Reactions  . Imipramine Tinitus  . Latex Rash  . Penicillins Rash  . Tape Rash    Past Medical  History:  Diagnosis Date  . Anxiety   . Atrial fibrillation ([place])   . Breast cancer ([place]) 1999   RT MASTECTOMY  . DDD (degenerative disc disease), cervical   . Depression   . Diverticulosis   . Diverticulosis   . DVT of lower extremity (deep venous thrombosis) ([place])   . Dyspnea   . GERD (gastroesophageal reflux disease)   . Goiter   . Headache   . History of chemotherapy 2000   BREAST CA  . Hypertension   . Incontinence   . OSA (obstructive sleep apnea)   . Osteoporosis   . Personal history of chemotherapy 1999   BREAST CA  . Status post chemotherapy    2000 right breast cancer  . Thyroid disease   . UTI (urinary tract infection)   . Vertigo     Past Surgical History:  Procedure Laterality Date  . ABDOMINAL HYSTERECTOMY    . BREAST BIOPSY Left 2006   negative  . CHOLECYSTECTOMY    . COCHLEAR IMPLANT Right   . COLONOSCOPY WITH PROPOFOL N/A 07/24/2016   Procedure: COLONOSCOPY WITH PROPOFOL;  Surgeon: Manya Silvas, MD;  Location: [facility] ENDOSCOPY;  Service: Endoscopy;  Laterality: N/A;  . ESOPHAGOGASTRODUODENOSCOPY (EGD) WITH PROPOFOL N/A 04/12/2016   Procedure: ESOPHAGOGASTRODUODENOSCOPY (EGD) WITH PROPOFOL;  Surgeon: Manya Silvas, MD;  Location: [facility] ENDOSCOPY;  Service: Endoscopy;  Laterality: N/A;  . EYE SURGERY Bilateral  cataract  . FOOT SURGERY Bilateral   . HAND SURGERY Right   . MASTECTOMY Right 1999   BREAST CA  . MASTECTOMY Right 1999  . PARTIAL COLECTOMY    . THYROIDECTOMY    . VIDEO ASSISTED THORACOSCOPY Left 09/10/2017   Procedure: VIDEO ASSISTED THORACOSCOPY;  Surgeon: [name], MD;  Location: [facility] ORS;  Service: Thoracic;  Laterality: Left;  with biopsies  . VIDEO BRONCHOSCOPY  09/10/2017   Procedure: VIDEO BRONCHOSCOPY;  Surgeon: [name], MD;  Location: [facility] ORS;  Service: Thoracic;;    Social History   Socioeconomic History  . Marital status: Married    Spouse name: evert  . Number of children: 4  . Years of education: Not on  file  . Highest education level: 9th grade  Occupational History  . Not on file  Social Needs  . Financial resource strain: Somewhat hard  . Food insecurity:    Worry: Often true    Inability: Often true  . Transportation needs:    Medical: No    Non-medical: No  Tobacco Use  . Smoking status: Never Smoker  . Smokeless tobacco: Never Used  Substance and Sexual Activity  . Alcohol use: No    Alcohol/week: 0.0 standard drinks  . Drug use: No  . Sexual activity: Never  Lifestyle  . Physical activity:    Days per week: 0 days    Minutes per session: 0 min  . Stress: Not at all  Relationships  . Social connections:    Talks on phone: Not on file    Gets together: Not on file    Attends religious service: More than 4 times per year    Active member of club or organization: No    Attends meetings of clubs or organizations: Never    Relationship status: Married  . Intimate partner violence:    Fear of current or ex partner: No    Emotionally abused: No    Physically abused: No    Forced sexual activity: No  Other Topics Concern  . Not on file  Social History Narrative  . Not on file    Family History  Problem Relation Age of Onset  . Dementia Mother   . Dementia Sister   . Diabetes Sister   . Heart attack Brother   . COPD Brother   . Colon cancer Brother   . Liver cancer Brother   . COPD Brother   . Breast cancer Neg Hx      Current Outpatient Medications:  .  atorvastatin (LIPITOR) 40 MG tablet, Take 40 mg by mouth at bedtime., Disp: , Rfl:  .  cholecalciferol (VITAMIN D) 1000 units tablet, Take 1,000 Units by mouth daily., Disp: , Rfl:  .  cyanocobalamin (V-R VITAMIN B-12) 500 MCG tablet, Take 500 mcg by mouth daily. , Disp: , Rfl:  .  DULoxetine (CYMBALTA) 60 MG capsule, Take 1 capsule (60 mg total) by mouth every morning., Disp: 90 capsule, Rfl: 1 .  letrozole (FEMARA) 2.5 MG tablet, Take 1 tablet (2.5 mg total) by mouth daily., Disp: 30 tablet, Rfl: 5 .   ondansetron (ZOFRAN) 4 MG tablet, Take 1 tablet (4 mg total) by mouth every 6 (six) hours as needed for nausea or vomiting., Disp: 30 tablet, Rfl: 1 .  palbociclib (IBRANCE) 100 MG capsule, Take 1 capsule (100 mg total) by mouth daily with breakfast. Take whole with food. Take for 21 days on, 7 days off, repeat Q28 days. (Patient taking differently:  Take 100 mg by mouth daily with supper. Take whole with food. Take for 21 days on, 7 days off, repeat Q28 days.), Disp: 21 capsule, Rfl: 5 .  pantoprazole (PROTONIX) 40 MG tablet, Take 40 mg by mouth 2 (two) times daily. , Disp: , Rfl: 11 .  SYNTHROID 88 MCG tablet, Take 88 mcg by mouth daily., Disp: , Rfl: 11 .  busPIRone (BUSPAR) 10 MG tablet, Take 10 mg by mouth daily., Disp: , Rfl:  .  diazepam (VALIUM) 5 MG tablet, Take 1 tablet (5 mg total) by mouth every 12 (twelve) hours as needed for anxiety., Disp: 60 tablet, Rfl: 0 .  oxyCODONE (OXY IR/ROXICODONE) 5 MG immediate release tablet, Take 1 tablet (5 mg total) by mouth every 6 (six) hours as needed for moderate pain or severe pain., Disp: 60 tablet, Rfl: 0  Physical exam:  Vitals:   10/30/17 1355  BP: 130/81  Pulse: 79  Resp: (!) 22  TempSrc: Tympanic  SpO2: 98%  Weight: 164 lb 12.8 oz (74.8 kg)   Physical Exam  Constitutional: She is oriented to person, place, and time. No distress.  Elderly female; accompanied  HENT:  Head: Normocephalic and atraumatic.  Mouth/Throat: Oropharynx is clear and moist.  Neck: Neck supple.  Cardiovascular: Normal rate and regular rhythm.  Pulmonary/Chest: Effort normal. She has decreased breath sounds in the left lower field. She has no wheezes. She has no rhonchi. She has no rales. She exhibits tenderness (Left lateral chest at site of prior chest tube; well healed surgical scar) and swelling (soft tissue swelling at site of prior chest tube).  Abdominal: Soft. There is no tenderness.  Neurological: She is alert and oriented to person, place, and time.    Skin: Skin is warm and dry.  Psychiatric: Her mood appears anxious.     CMP Latest Ref Rng & Units 10/30/2017  Glucose 70 - 99 mg/dL 106(H)  BUN 8 - 23 mg/dL 12  Creatinine 0.44 - 1.00 mg/dL 0.91  Sodium 135 - 145 mmol/L 139  Potassium 3.5 - 5.1 mmol/L 4.1  Chloride 98 - 111 mmol/L 106  CO2 22 - 32 mmol/L 25  Calcium 8.9 - 10.3 mg/dL 8.8(L)  Total Protein 6.5 - 8.1 g/dL 7.0  Total Bilirubin 0.3 - 1.2 mg/dL 0.9  Alkaline Phos 38 - 126 U/L 50  AST 15 - 41 U/L 20  ALT 0 - 44 U/L 12   CBC Latest Ref Rng & Units 10/30/2017  WBC 3.6 - 11.0 K/uL 3.7  Hemoglobin 12.0 - 16.0 g/dL 11.7(L)  Hematocrit 35.0 - 47.0 % 34.5(L)  Platelets 150 - 440 K/uL 327    No images are attached to the encounter.  No results found.  Assessment and plan- Patient is a 77 y.o. female with metastatic left breast cancer who presents to symptom management clinic for shortness of breath and chest wall pain.  1.  Metastatic left breast cancer-metastatic ER positive breast cancer with mets to pleura and lymph node with low volume disease.  History of right breast cancer in 1995 s/p mastectomy, adjuvant chemotherapy, 5 years of tamoxifen.  Imaging done for low back pain revealed nodularity at the posterior left diaphragm.  Pathology revealed metastatic carcinoma with breast primary.  Currently cycle 2 of Ibrance and daily letrozole.  Tolerating well except for fatigue.  Labs today overall reassuring.  Dr. Janese Banks managing imaging.   2.  Shortness of breath-progressively worsening.  Labs today overall reassuring.  Hemoglobin 11.7.  WBC 3.7.  Afebrile. SpO2 98%. She has follow-up scheduled with Dr. Raul Del, pulmonology, next week.  Previous recommendation included 6-minute walk, overnight pulse ox, PFTs, and echo.  She is not currently on inhalers or nebulizers due to intolerance to Spiriva and I am reluctant to start anything new given that she is seeing pulmonology in a few days.. Suspect anxiety and pain contributory (see  below).  Environmental exposure due to work history and cotton [employer] and underlying COPD or emphysema is likely.  Will get chest x-ray today.    3.  Pain-etiology unclear-metastatic disease?  Prior chest tube?  Others?  We will get chest x-ray.  Will start Oxycodone 5mg  prn for pain. Will refill valium as if pain is associated with anxiety and/or muscle related this may improve her symptoms and she was tolerating it well at home.  Given prior history of constipation discussed prevention and management of opioid-induced constipation.  Advised not to drive taking sedating medications.  4. Anxiety-chronic MDD but anxiety acutely worse d/t cancer diagnosis. Has been taking Valium at home with some improvement (prescribed by Dr. Caryl Comes, pcp).  She is followed by Dr. Gretel Acre, [place] who started her on BuSpar 10 mg qd in July. She continues Cymbalta and trazodone for sleep.  I attempted to reach out to Dr. Gretel Acre for consultation but was unable to get in touch with her.  Will route note to her for her input on continued management of anxiety symptoms.   Discussed findings with Dr. Janese Banks who contributed to and agreed with plan of care.   Follow-up with Dr. Raul Del, pulmonology as scheduled on 11/05/2017.  Follow-up with Dr. Janese Banks, medical oncology, on 11/13/2017. Patient advised to notify the clinic if there is no improvement in symptoms or if symptoms worsen in next 3-4 days.   Visit Diagnosis 1. Metastatic breast cancer ([place])   2. SOB (shortness of breath) on exertion   3. Left-sided chest wall pain   4. Anxiety associated with cancer diagnosis    Patient expressed understanding and was in agreement with this plan. She also understands that She can call clinic at any time with any questions, concerns, or complaints.   Thank you for allowing me to participate in the care of this very pleasant patient.   Beckey Rutter, DNP, AGNP-C [place] at [facility] [phone]  (work cell) [phone] (office)

## 2017-10-30 NOTE — Patient Instructions (Addendum)
Breanna Zhang,   It is common for patients who are undergoing treatment and taking certain prescribed medications to experience side-effects with constipation.  If you experience constipation, please take stool softeners such as Senna and/or Miralax every day to avoid constipation.  These medications are available over the counter.  Of course, if you have diarrhea, stop taking stool softeners.  Drinking plenty of fluid, eating fruits and vegetable, and being active also reduces the risk of constipation.   If despite taking stool softeners, and you still have no bowel movement for 2 days or more than your normal bowel habit frequency, please take one of the following over the counter laxatives:  Milk of Magnesia or Mag Citrate everyday and contact me immediately for further instructions.  The goal is to have at least one bowel movement every day or every other day without pain or straining.   I have sent prescriptions for Oxycodone and Valium to your pharmacy.  Please notify me if you've had no improvement in your symptoms, or symptoms become worse, in the next 3-4 days. I will call your daughter with results of chest x-ray. It was a pleasure meeting you today and thank you for allowing me to participate in your care.  Beckey Rutter, NP

## 2017-10-31 ENCOUNTER — Inpatient Hospital Stay: Payer: PPO

## 2017-11-05 DIAGNOSIS — R0609 Other forms of dyspnea: Secondary | ICD-10-CM | POA: Diagnosis not present

## 2017-11-08 ENCOUNTER — Telehealth: Payer: Self-pay | Admitting: Pharmacist

## 2017-11-08 MED FILL — IBRANCE 100 MG CAPSULE: 100 | 28 days supply | Qty: 21 | Fill #2

## 2017-11-08 NOTE — Telephone Encounter (Signed)
Oral Chemotherapy Pharmacist Encounter   Received communication from [facility] stating that Ms. Rohman would like more months of her Ibrance medication calendar. I used chart notes to verify where Ms. Mounsey was in here [place] cycle and sent over more calendar months to [facility] to be included in her medication shipment.  Darl Pikes, PharmD, BCPS, [facility] Hematology/Oncology Clinical Pharmacist [facility] [phone]  11/08/2017 10:39 AM

## 2017-11-13 ENCOUNTER — Inpatient Hospital Stay: Payer: PPO

## 2017-11-13 ENCOUNTER — Inpatient Hospital Stay: Payer: PPO | Attending: Oncology | Admitting: Oncology

## 2017-11-13 ENCOUNTER — Encounter: Payer: Self-pay | Admitting: Oncology

## 2017-11-13 VITALS — BP 124/63 | HR 71 | Temp 97.8°F | Resp 18 | Ht 66.0 in | Wt 168.2 lb

## 2017-11-13 DIAGNOSIS — Z79811 Long term (current) use of aromatase inhibitors: Secondary | ICD-10-CM | POA: Insufficient documentation

## 2017-11-13 DIAGNOSIS — Z9221 Personal history of antineoplastic chemotherapy: Secondary | ICD-10-CM | POA: Diagnosis not present

## 2017-11-13 DIAGNOSIS — J90 Pleural effusion, not elsewhere classified: Secondary | ICD-10-CM

## 2017-11-13 DIAGNOSIS — Z79899 Other long term (current) drug therapy: Secondary | ICD-10-CM

## 2017-11-13 DIAGNOSIS — Z170 Estrogen receptor positive status [ER+]: Secondary | ICD-10-CM | POA: Diagnosis not present

## 2017-11-13 DIAGNOSIS — D649 Anemia, unspecified: Secondary | ICD-10-CM | POA: Diagnosis not present

## 2017-11-13 DIAGNOSIS — C50919 Malignant neoplasm of unspecified site of unspecified female breast: Secondary | ICD-10-CM

## 2017-11-13 DIAGNOSIS — F4310 Post-traumatic stress disorder, unspecified: Secondary | ICD-10-CM | POA: Diagnosis not present

## 2017-11-13 DIAGNOSIS — M545 Low back pain: Secondary | ICD-10-CM | POA: Diagnosis not present

## 2017-11-13 DIAGNOSIS — C50912 Malignant neoplasm of unspecified site of left female breast: Secondary | ICD-10-CM

## 2017-11-13 DIAGNOSIS — Z853 Personal history of malignant neoplasm of breast: Secondary | ICD-10-CM | POA: Diagnosis not present

## 2017-11-13 DIAGNOSIS — Z9011 Acquired absence of right breast and nipple: Secondary | ICD-10-CM | POA: Diagnosis not present

## 2017-11-13 DIAGNOSIS — C782 Secondary malignant neoplasm of pleura: Secondary | ICD-10-CM | POA: Diagnosis not present

## 2017-11-13 LAB — CBC WITH DIFFERENTIAL/PLATELET
BASOS ABS: 0.1 10*3/uL (ref 0–0.1)
Basophils Relative: 1 %
Eosinophils Absolute: 0 10*3/uL (ref 0–0.7)
Eosinophils Relative: 1 %
HCT: 32.3 % — ABNORMAL LOW (ref 35.0–47.0)
Hemoglobin: 11 g/dL — ABNORMAL LOW (ref 12.0–16.0)
LYMPHS PCT: 50 %
Lymphs Abs: 2.1 10*3/uL (ref 1.0–3.6)
MCH: 32 pg (ref 26.0–34.0)
MCHC: 34.2 g/dL (ref 32.0–36.0)
MCV: 93.5 fL (ref 80.0–100.0)
MONO ABS: 0.5 10*3/uL (ref 0.2–0.9)
MONOS PCT: 13 %
Neutro Abs: 1.5 10*3/uL (ref 1.4–6.5)
Neutrophils Relative %: 35 %
PLATELETS: 136 10*3/uL — AB (ref 150–440)
RBC: 3.45 MIL/uL — ABNORMAL LOW (ref 3.80–5.20)
RDW: 19.3 % — AB (ref 11.5–14.5)
WBC: 4.2 10*3/uL (ref 3.6–11.0)

## 2017-11-13 LAB — COMPREHENSIVE METABOLIC PANEL
ALT: 13 U/L (ref 0–44)
AST: 21 U/L (ref 15–41)
Albumin: 3.8 g/dL (ref 3.5–5.0)
Alkaline Phosphatase: 50 U/L (ref 38–126)
Anion gap: 8 (ref 5–15)
BUN: 15 mg/dL (ref 8–23)
CHLORIDE: 104 mmol/L (ref 98–111)
CO2: 26 mmol/L (ref 22–32)
Calcium: 8.3 mg/dL — ABNORMAL LOW (ref 8.9–10.3)
Creatinine, Ser: 0.77 mg/dL (ref 0.44–1.00)
Glucose, Bld: 114 mg/dL — ABNORMAL HIGH (ref 70–99)
Potassium: 3.9 mmol/L (ref 3.5–5.1)
Sodium: 138 mmol/L (ref 135–145)
Total Bilirubin: 0.7 mg/dL (ref 0.3–1.2)
Total Protein: 6.8 g/dL (ref 6.5–8.1)

## 2017-11-13 NOTE — Progress Notes (Signed)
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 She underwent mastectomy followed by adjuvant chemotherapy and 5 years of tamoxifen. Patient has been having some low back pain and underwent a CT lumbar spine without contrast on 08/10/2017 which was done by the pain clinic. CT mainly showed age-related degenerative disease but also showed incidental nodular thickening of the posterior left diaphragm concerning for tumor and a CT chest was recommended.  CT chest on 08/14/2017 showed scattered left pleural nodularity with tiny left pleural effusion and prominent left internal mammary and juxtadiaphragmatic lymph nodes which are nonspecific but metastatic disease could not be ruled out.  This was followed by a PET/CT scan on 08/22/2017 which showed numerous small left-sided pleural nodules which were mildly hypermetabolic along with hypermetabolic mediastinal lymph nodes concerning for metastatic disease. Small metastatic nodule in the left upper quadrant partly in the retrocrural space. No findings of pulmonary metastatic or osseous metastatic disease.   Patient underwent FNA of retrocrural LN which was positive for carcinoma but primary could not be ascertained. She then underwent pleural  biopsy which showed metastatic carcinoma consistent with breast primary. ER+ HER-2.  Patient started taking Ibrance in August 2019   Interval history-patient is status post 2 cycles of Ibrance and overall tolerating it well except for fatigue.  She is also been taking letrozole daily.  Patient has chronic exertional shortness of breath for which she sees Dr. Raul Del and reports that it is marginally better after she was started on a new inhaler.  She also has a cardiology evaluation coming up with Dr. Ubaldo Glassing.  She occasionally experiences left chest wall pain at the site of biopsy for which she takes oxycodone as needed.  Denies any rash or diarrhea  ECOG PS- 2 Pain scale- 2 Opioid associated constipation- no  Review of systems- Review of Systems  Constitutional: Positive for malaise/fatigue. Negative for chills, fever and weight loss.  HENT: Negative for congestion, ear discharge and nosebleeds.   Eyes: Negative for blurred vision.  Respiratory: Positive for shortness of breath. Negative for cough, hemoptysis, sputum production and wheezing.   Cardiovascular: Negative for chest pain, palpitations, orthopnea and claudication.  Gastrointestinal: Negative for abdominal pain, blood in stool, constipation, diarrhea, heartburn, melena, nausea and vomiting.  Genitourinary: Negative for dysuria, flank pain, frequency, hematuria and urgency.  Musculoskeletal: Negative for back pain, joint pain and myalgias.  Skin: Negative for rash.  Neurological: Negative for dizziness, tingling, focal weakness, seizures, weakness and headaches.  Endo/Heme/Allergies: Does not bruise/bleed easily.  Psychiatric/Behavioral: Negative for depression and suicidal ideas. The patient does not have insomnia.       Allergies  Allergen Reactions  . Imipramine Tinitus  . Latex Rash  . Penicillins Rash  . Tape Rash     Past Medical History:  Diagnosis Date  . Anxiety   . Atrial fibrillation ([place])   .  Breast cancer  ([place]) 1999   RT MASTECTOMY  . DDD (degenerative disc disease), cervical   . Depression   . Diverticulosis   . Diverticulosis   . DVT of lower extremity (deep venous thrombosis) ([place])   . Dyspnea   . GERD (gastroesophageal reflux disease)   . Goiter   . Headache   . History of chemotherapy 2000   BREAST CA  . Hypertension   . Incontinence   . OSA (obstructive sleep apnea)   . Osteoporosis   . Personal history of chemotherapy 1999   BREAST CA  . Status post chemotherapy    2000 right breast cancer  . Thyroid disease   . UTI (urinary tract infection)   . Vertigo      Past Surgical History:  Procedure Laterality Date  . ABDOMINAL HYSTERECTOMY    . BREAST BIOPSY Left 2006   negative  . CHOLECYSTECTOMY    . COCHLEAR IMPLANT Right   . COLONOSCOPY WITH PROPOFOL N/A 07/24/2016   Procedure: COLONOSCOPY WITH PROPOFOL;  Surgeon: Manya Silvas, MD;  Location: [facility] ENDOSCOPY;  Service: Endoscopy;  Laterality: N/A;  . ESOPHAGOGASTRODUODENOSCOPY (EGD) WITH PROPOFOL N/A 04/12/2016   Procedure: ESOPHAGOGASTRODUODENOSCOPY (EGD) WITH PROPOFOL;  Surgeon: Manya Silvas, MD;  Location: [facility] ENDOSCOPY;  Service: Endoscopy;  Laterality: N/A;  . EYE SURGERY Bilateral    cataract  . FOOT SURGERY Bilateral   . HAND SURGERY Right   . MASTECTOMY Right 1999   BREAST CA  . MASTECTOMY Right 1999  . PARTIAL COLECTOMY    . THYROIDECTOMY    . VIDEO ASSISTED THORACOSCOPY Left 09/10/2017   Procedure: VIDEO ASSISTED THORACOSCOPY;  Surgeon: [name], MD;  Location: [facility] ORS;  Service: Thoracic;  Laterality: Left;  with biopsies  . VIDEO BRONCHOSCOPY  09/10/2017   Procedure: VIDEO BRONCHOSCOPY;  Surgeon: [name], MD;  Location: [facility] ORS;  Service: Thoracic;;    Social History   Socioeconomic History  . Marital status: Married    Spouse name: evert  . Number of children: 4  . Years of education: Not on file  . Highest education level: 9th grade  Occupational History  . Not on file    Social Needs  . Financial resource strain: Somewhat hard  . Food insecurity:    Worry: Often true    Inability: Often true  . Transportation needs:    Medical: No    Non-medical: No  Tobacco Use  . Smoking status: Never Smoker  . Smokeless tobacco: Never Used  Substance and Sexual Activity  . Alcohol use: No    Alcohol/week: 0.0 standard drinks  . Drug use: No  . Sexual activity: Never  Lifestyle  . Physical activity:    Days per week: 0 days    Minutes per session: 0 min  . Stress: Not at all  Relationships  . Social connections:    Talks on phone: Not on file    Gets together: Not on file    Attends religious service: More than 4 times per year    Active member of club or organization: No    Attends meetings of clubs or organizations: Never    Relationship status: Married  . Intimate partner violence:    Fear of current or ex partner: No    Emotionally abused: No    Physically abused: No    Forced sexual activity: No  Other Topics Concern  . Not on file  Social History Narrative  . Not on file  Family History  Problem Relation Age of Onset  . Dementia Mother   . Dementia Sister   . Diabetes Sister   . Heart attack Brother   . COPD Brother   . Colon cancer Brother   . Liver cancer Brother   . COPD Brother   . Breast cancer Neg Hx      Current Outpatient Medications:  .  atorvastatin (LIPITOR) 40 MG tablet, Take 40 mg by mouth at bedtime., Disp: , Rfl:  .  cholecalciferol (VITAMIN D) 1000 units tablet, Take 1,000 Units by mouth daily., Disp: , Rfl:  .  cyanocobalamin (V-R VITAMIN B-12) 500 MCG tablet, Take 500 mcg by mouth daily. , Disp: , Rfl:  .  DULoxetine (CYMBALTA) 60 MG capsule, Take 1 capsule (60 mg total) by mouth every morning., Disp: 90 capsule, Rfl: 1 .  letrozole (FEMARA) 2.5 MG tablet, Take 1 tablet (2.5 mg total) by mouth daily., Disp: 30 tablet, Rfl: 5 .  oxyCODONE (OXY IR/ROXICODONE) 5 MG immediate release tablet, Take 1 tablet (5 mg  total) by mouth every 6 (six) hours as needed for moderate pain or severe pain., Disp: 60 tablet, Rfl: 0 .  palbociclib (IBRANCE) 100 MG capsule, Take 1 capsule (100 mg total) by mouth daily with breakfast. Take whole with food. Take for 21 days on, 7 days off, repeat Q28 days., Disp: 21 capsule, Rfl: 5 .  pantoprazole (PROTONIX) 40 MG tablet, Take 40 mg by mouth 2 (two) times daily. , Disp: , Rfl: 11 .  SYNTHROID 88 MCG tablet, Take 88 mcg by mouth daily., Disp: , Rfl: 11 .  busPIRone (BUSPAR) 10 MG tablet, Take 10 mg by mouth daily., Disp: , Rfl:  .  diazepam (VALIUM) 5 MG tablet, Take 1 tablet (5 mg total) by mouth every 12 (twelve) hours as needed for anxiety. (Patient not taking: Reported on 11/13/2017), Disp: 60 tablet, Rfl: 0 .  ondansetron (ZOFRAN) 4 MG tablet, Take 1 tablet (4 mg total) by mouth every 6 (six) hours as needed for nausea or vomiting. (Patient not taking: Reported on 11/13/2017), Disp: 30 tablet, Rfl: 1  Physical exam:  Vitals:   11/13/17 1040  BP: 124/63  Pulse: 71  Resp: 18  Temp: 97.8 F (36.6 C)  TempSrc: Tympanic  SpO2: 100%  Weight: 168 lb 3.2 oz (76.3 kg)  Height: 5' 6"  (1.676 m)   Physical Exam  Constitutional: She is oriented to person, place, and time.  Elderly female who ambulates with a cane.  Appears in no acute distress  HENT:  Head: Normocephalic and atraumatic.  Eyes: Pupils are equal, round, and reactive to light. EOM are normal.  Neck: Normal range of motion.  Cardiovascular: Normal rate, regular rhythm and normal heart sounds.  Pulmonary/Chest: Effort normal and breath sounds normal.  Abdominal: Soft. Bowel sounds are normal.  Musculoskeletal: She exhibits no edema.  Neurological: She is alert and oriented to person, place, and time.  Skin: Skin is warm and dry.     CMP Latest Ref Rng & Units 11/13/2017  Glucose 70 - 99 mg/dL 114(H)  BUN 8 - 23 mg/dL 15  Creatinine 0.44 - 1.00 mg/dL 0.77  Sodium 135 - 145 mmol/L 138  Potassium 3.5 - 5.1  mmol/L 3.9  Chloride 98 - 111 mmol/L 104  CO2 22 - 32 mmol/L 26  Calcium 8.9 - 10.3 mg/dL 8.3(L)  Total Protein 6.5 - 8.1 g/dL 6.8  Total Bilirubin 0.3 - 1.2 mg/dL 0.7  Alkaline Phos 38 -  126 U/L 50  AST 15 - 41 U/L 21  ALT 0 - 44 U/L 13   CBC Latest Ref Rng & Units 11/13/2017  WBC 3.6 - 11.0 K/uL 4.2  Hemoglobin 12.0 - 16.0 g/dL 11.0(L)  Hematocrit 35.0 - 47.0 % 32.3(L)  Platelets 150 - 440 K/uL 136(L)    No images are attached to the encounter.  Dg Chest 2 View  Result Date: 10/31/2017 CLINICAL DATA:  Shortness of breath and chest wall pain deep to the left breast. History of right mastectomy, known metastatic breast malignancy. EXAM: CHEST - 2 VIEW COMPARISON:  PA and lateral chest x-ray of September 25, 2017 FINDINGS: The lungs are adequately inflated. There is no focal infiltrate. The interstitial markings are coarse. There is no pleural effusion or pneumothorax. The heart and pulmonary vascularity are normal. The mediastinum is normal in width. The trachea is midline. The bony thorax exhibits no acute abnormality. IMPRESSION: Chronic bronchitic changes, stable. No acute pneumonia, CHF, nor suspicious pulmonary parenchymal masses. Electronically Signed   By: David  Martinique M.D.   On: 10/31/2017 07:49     Assessment and plan- Patient is a 77 y.o. 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 Low volume disease.  She is currently on letrozole and Ibrance  Continue letrozole daily along with calcium and vitamin D.  She is currently on 100 mg of Ibrance 3 weeks on 1 week off and will start cycle 3 tomorrow.  Counts are okay to proceed with cycle 3 of Ibrance tomorrow.  She has mild anemia and thrombocytopenia likely secondary to [facility] which we will continue to monitor.  I will see her back on 12/11/2017 prior to cycle 4 of Ibrance.  I will plan to get repeat scans after 4 cycles.  Left chest wall pain: Etiology unclear.  She does have pleural nodules but  overall her disease burden is low.  She will continue PRN oxycodone at this time  Exertional shortness of breath currently being evaluated by pulmonary and cardiology   Visit Diagnosis 1. Metastatic breast cancer ([place])   2. High risk medication use      Dr. Randa Evens, MD, MPH [facility] [phone] 11/13/2017 11:36 AM

## 2017-11-14 ENCOUNTER — Encounter: Payer: Self-pay | Admitting: Oncology

## 2017-11-14 ENCOUNTER — Other Ambulatory Visit: Payer: Self-pay

## 2017-11-14 DIAGNOSIS — E785 Hyperlipidemia, unspecified: Secondary | ICD-10-CM | POA: Insufficient documentation

## 2017-11-14 DIAGNOSIS — G4733 Obstructive sleep apnea (adult) (pediatric): Secondary | ICD-10-CM | POA: Diagnosis not present

## 2017-11-14 DIAGNOSIS — C3492 Malignant neoplasm of unspecified part of left bronchus or lung: Secondary | ICD-10-CM | POA: Diagnosis not present

## 2017-11-14 DIAGNOSIS — H353211 Exudative age-related macular degeneration, right eye, with active choroidal neovascularization: Secondary | ICD-10-CM | POA: Diagnosis not present

## 2017-11-14 DIAGNOSIS — R0602 Shortness of breath: Secondary | ICD-10-CM | POA: Insufficient documentation

## 2017-11-14 DIAGNOSIS — R079 Chest pain, unspecified: Secondary | ICD-10-CM | POA: Insufficient documentation

## 2017-11-14 DIAGNOSIS — I10 Essential (primary) hypertension: Secondary | ICD-10-CM | POA: Diagnosis not present

## 2017-11-14 DIAGNOSIS — C50919 Malignant neoplasm of unspecified site of unspecified female breast: Secondary | ICD-10-CM

## 2017-11-14 LAB — CANCER ANTIGEN 15-3: CAN 15 3: 47.5 U/mL — AB (ref 0.0–25.0)

## 2017-11-14 LAB — CA 27.29 (SERIAL MONITOR): CAN 27.29: 58.8 U/mL — AB (ref 0.0–38.6)

## 2017-11-21 ENCOUNTER — Telehealth: Payer: Self-pay | Admitting: *Deleted

## 2017-11-21 DIAGNOSIS — H903 Sensorineural hearing loss, bilateral: Secondary | ICD-10-CM | POA: Diagnosis not present

## 2017-11-21 DIAGNOSIS — Z45321 Encounter for adjustment and management of cochlear device: Secondary | ICD-10-CM | POA: Diagnosis not present

## 2017-11-21 NOTE — Telephone Encounter (Signed)
Daughter called reporting that someone called the patient today regarding blood work and patient did not understand what she was told and asked her daughter to find out. Please return her call [phone]

## 2017-11-21 NOTE — Telephone Encounter (Signed)
Got a call stating that the omniseq testing-mixed gene testing was approved after they got info from CMS that addressed the testing . A letter sent to our dept. At [phone] per Jackelyn Poling.

## 2017-11-21 NOTE — Telephone Encounter (Signed)
Called daughter and got her voice mail and left message that I did not call about any lab results and courtney who works with me did not call about lab results. I did get a call from pt's insurance that some specialty testing on her tumor was approved. I asked her to call me back if she can give me more details to help figure out where call came from

## 2017-11-23 DIAGNOSIS — G4733 Obstructive sleep apnea (adult) (pediatric): Secondary | ICD-10-CM | POA: Diagnosis not present

## 2017-11-26 ENCOUNTER — Encounter: Payer: Self-pay | Admitting: Psychiatry

## 2017-11-26 ENCOUNTER — Ambulatory Visit (INDEPENDENT_AMBULATORY_CARE_PROVIDER_SITE_OTHER): Payer: PPO | Admitting: Psychiatry

## 2017-11-26 ENCOUNTER — Other Ambulatory Visit: Payer: Self-pay

## 2017-11-26 VITALS — BP 125/74 | HR 76 | Temp 99.2°F | Wt 169.2 lb

## 2017-11-26 DIAGNOSIS — F0630 Mood disorder due to known physiological condition, unspecified: Secondary | ICD-10-CM | POA: Diagnosis not present

## 2017-11-26 DIAGNOSIS — R739 Hyperglycemia, unspecified: Secondary | ICD-10-CM | POA: Diagnosis not present

## 2017-11-26 DIAGNOSIS — E034 Atrophy of thyroid (acquired): Secondary | ICD-10-CM | POA: Diagnosis not present

## 2017-11-26 DIAGNOSIS — G4733 Obstructive sleep apnea (adult) (pediatric): Secondary | ICD-10-CM | POA: Diagnosis not present

## 2017-11-26 DIAGNOSIS — I10 Essential (primary) hypertension: Secondary | ICD-10-CM | POA: Diagnosis not present

## 2017-11-26 MED ORDER — DULOXETINE HCL 60 MG PO CPEP: 60.0000 mg | ORAL_CAPSULE | Freq: Every morning | ORAL | 1 refills | Status: DC

## 2017-11-26 MED ORDER — DIAZEPAM 5 MG PO TABS: ORAL_TABLET | ORAL | 0 refills | Status: DC

## 2017-11-26 NOTE — Progress Notes (Signed)
Psychiatric Follow Up MD  Note  Patient Identification: Breanna Zhang MRN:  [id number] Date of Evaluation:  11/26/2017 Referral Source: Dr Bridgett Larsson  Chief Complaint:   Chief Complaint    Follow-up; Medication Refill     Visit Diagnosis:    ICD-10-CM   1. 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 Metastatic breast cancer ([place]) [C50.919] 09/17/2017  . Lung cancer ([place]) [C34.90] 09/10/2017  . Headache disorder [R51] 02/26/2017  . Arthralgia of both hands [M25.541, M25.542] 05/24/2016  . Personal history of malignant neoplasm of breast [Z85.3] 09/08/2015  . Adult hypothyroidism [E03.9] 04/06/2015  . Blood glucose elevated [R73.9] 09/07/2014  . 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 Clinical depression [F32.9] 06/22/2014  . Acid reflux [K21.9] 06/22/2014  . BP (high blood pressure) [I10] 06/22/2014  . Malignant neoplasm of breast ([place]) [C50.919] 06/22/2014  . Frequent UTI [N39.0] 06/22/2014  . Apnea, sleep [G47.30] 06/22/2014  . Head revolving around [R42] 06/22/2014  . Dizziness [R42] 04/16/2014  . 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 Neuritis or radiculitis due to rupture of lumbar intervertebral disc [M51.16] 10/06/2013  . 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 Lumbar radiculitis [M54.16] 10/06/2013  . Cystocele, midline [N81.11] 11/16/2011  . Female genuine stress incontinence [N39.3] 11/16/2011  . Incomplete bladder emptying [R33.9] 11/16/2011  . 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 Excessive urination at night [R35.1] 11/16/2011  . Neuralgia neuritis, sciatic nerve [M54.30] 11/16/2011  . Urge incontinence of urine [N39.41] 11/16/2011   History of Present Illness:  Patient is a 77 year old married female who presented for follow-up accompanied by her daughter.  She stated that she has been followed by her  oncologist and the PA on a regular basis.  She has been diagnosed with cancer in her left breast and she has been feeling depressed.  Patient reported that she stopped taking the BuSpar as it was not helping with her anxiety.  Her oncologist and the PA gave her Valium and she is does not like taking the medication as it is making her too tired and sleepy.  Her daughter also reported that she was initially given Valium 2 mg and she did well but the dose was gradually titrated to 5 mg.  However patient does not want to take the medication as it is making her very sleepy.  We discussed about decreasing the dose back to 1.25 mg to help with her anxiety.  Patient reported that she started having chest pain when she is very anxious.  She wants to get better and is positive about her health and wants to spend time with her family.    Her daughter remains supportive at this time.  She is getting her medications in a timely fashion and has been following with the pulmonologist and cardiologist on a regular basis.  She has enough supply of Valium at this time.  I will refill her Cymbalta.           She appears anxious about her new diagnosis but remains optimistic about her treatment.   Daughter [name] is a very supportive and helps her with her ADLs .  Patient denied having any suicidal homicidal ideations or plans.  She denied having any perceptual disturbances.  She continues her treatment as prescribed.     Elements:  Severity:  mild. Associated Signs/Symptoms: Depression Symptoms:  loss of energy/fatigue, (  Hypo) Manic Symptoms:  none Anxiety Symptoms:  Excessive Worry, Psychotic Symptoms:  none PTSD Symptoms: Negative NA  Past Medical History:  Past Medical History:  Diagnosis Date  . Anxiety   . Atrial fibrillation ([place])   . Breast cancer ([place]) 1999   RT MASTECTOMY  . DDD (degenerative disc disease), cervical   . Depression   . Diverticulosis   . Diverticulosis   . DVT of lower extremity  (deep venous thrombosis) ([place])   . Dyspnea   . GERD (gastroesophageal reflux disease)   . Goiter   . Headache   . History of chemotherapy 2000   BREAST CA  . Hypertension   . Incontinence   . OSA (obstructive sleep apnea)   . Osteoporosis   . Personal history of chemotherapy 1999   BREAST CA  . Status post chemotherapy    2000 right breast cancer  . Thyroid disease   . UTI (urinary tract infection)   . Vertigo     Past Surgical History:  Procedure Laterality Date  . ABDOMINAL HYSTERECTOMY    . BREAST BIOPSY Left 2006   negative  . CHOLECYSTECTOMY    . COCHLEAR IMPLANT Right   . COLONOSCOPY WITH PROPOFOL N/A 07/24/2016   Procedure: COLONOSCOPY WITH PROPOFOL;  Surgeon: Manya Silvas, MD;  Location[facility] ENDOSCOPY;  Service: Endoscopy;  Laterality: N/A;  . ESOPHAGOGASTRODUODENOSCOPY (EGD) WITH PROPOFOL N/A 04/12/2016   Procedure: ESOPHAGOGASTRODUODENOSCOPY (EGD) WITH PROPOFOL;  Surgeon: Manya Silvas, MD;  Location: [facility] ENDOSCOPY;  Service: Endoscopy;  Laterality: N/A;  . EYE SURGERY Bilateral    cataract  . FOOT SURGERY Bilateral   . HAND SURGERY Right   . MASTECTOMY Right 1999   BREAST CA  . MASTECTOMY Right 1999  . PARTIAL COLECTOMY    . THYROIDECTOMY    . VIDEO ASSISTED THORACOSCOPY Left 09/10/2017   Procedure: VIDEO ASSISTED THORACOSCOPY;  Surgeon: [name], MD;  Location: [facility] ORS;  Service: Thoracic;  Laterality: Left;  with biopsies  . VIDEO BRONCHOSCOPY  09/10/2017   Procedure: VIDEO BRONCHOSCOPY;  Surgeon: [name], MD;  Location: [facility] ORS;  Service: Thoracic;;   Family History:  Family History  Problem Relation Age of Onset  . Dementia Mother   . Dementia Sister   . Diabetes Sister   . Heart attack Brother   . COPD Brother   . Colon cancer Brother   . Liver cancer Brother   . COPD Brother   . Breast cancer Neg Hx    Social History:   Social History   Socioeconomic History  . Marital status: Married    Spouse name: evert  . Number of  children: 4  . Years of education: Not on file  . Highest education level: 9th grade  Occupational History  . Not on file  Social Needs  . Financial resource strain: Somewhat hard  . Food insecurity:    Worry: Often true    Inability: Often true  . Transportation needs:    Medical: No    Non-medical: No  Tobacco Use  . Smoking status: Never Smoker  . Smokeless tobacco: Never Used  Substance and Sexual Activity  . Alcohol use: No    Alcohol/week: 0.0 standard drinks  . Drug use: No  . Sexual activity: Never  Lifestyle  . Physical activity:    Days per week: 0 days    Minutes per session: 0 min  . Stress: Not at all  Relationships  . Social connections:    Talks  on phone: Not on file    Gets together: Not on file    Attends religious service: More than 4 times per year    Active member of club or organization: No    Attends meetings of clubs or organizations: Never    Relationship status: Married  Other Topics Concern  . Not on file  Social History Narrative  . Not on file   Additional Social History: Married x 4.  Husband is alcoholic. She is concerned about is behavoir. 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  Has 4 children.   Musculoskeletal: Strength & Muscle Tone: within normal limits Gait & Station: normal Patient leans: N/A  Psychiatric Specialty Exam: Medication Refill     ROS  Blood pressure 125/74, pulse 76, temperature 99.2 F (37.3 C), temperature source Oral, weight 169 lb 3.2 oz (76.7 kg).Body mass index is 27.31 kg/m.  General Appearance: Casual  Eye Contact:  Fair  Speech:  Clear and Coherent  Volume:  Normal  Mood:  Anxious  Affect:  Congruent  Thought Process:  Coherent and Goal Directed  Orientation:  Full (Time, Place, and Person)  Thought Content:  WDL  Suicidal Thoughts:  No  Homicidal Thoughts:  No  Memory:  Immediate;   Fair  Judgement:  Fair  Insight:  Fair  Psychomotor Activity:  Normal   Concentration:  Fair  Recall:  [employer] [place]  Language: Fair  Akathisia:  No  Handed:  Right  AIMS (if indicated):    Assets:  Communication Skills Desire for Improvement Physical Health Social Support Transportation  ADL's:  Intact  Cognition: WNL  Sleep:  7-8   Is the patient at risk to self?  No. Has the patient been a risk to self in the past 6 months?  No. Has the patient been a risk to self within the distant past?  No. Is the patient a risk to others?  No. Has the patient been a risk to others in the past 6 months?  No. Has the patient been a risk to others within the distant past?  No.  Allergies:   Allergies  Allergen Reactions  . Imipramine Tinitus  . Latex Rash  . Penicillins Rash  . Tape Rash   Current Medications: Current Outpatient Medications  Medication Sig Dispense Refill  . atorvastatin (LIPITOR) 40 MG tablet Take 40 mg by mouth at bedtime.    . busPIRone (BUSPAR) 10 MG tablet Take 10 mg by mouth daily.    . cholecalciferol (VITAMIN D) 1000 units tablet Take 1,000 Units by mouth daily.    . cyanocobalamin (V-R VITAMIN B-12) 500 MCG tablet Take 500 mcg by mouth daily.     . diazepam (VALIUM) 5 MG tablet Take 1 tablet (5 mg total) by mouth every 12 (twelve) hours as needed for anxiety. 60 tablet 0  . DULoxetine (CYMBALTA) 60 MG capsule Take 1 capsule (60 mg total) by mouth every morning. 90 capsule 1  . letrozole (FEMARA) 2.5 MG tablet Take 1 tablet (2.5 mg total) by mouth daily. 30 tablet 5  . ondansetron (ZOFRAN) 4 MG tablet Take 1 tablet (4 mg total) by mouth every 6 (six) hours as needed for nausea or vomiting. 30 tablet 1  . oxyCODONE (OXY IR/ROXICODONE) 5 MG immediate release tablet Take 1 tablet (5 mg total) by mouth every 6 (six) hours as needed for moderate pain or severe pain. 60 tablet 0  . palbociclib (IBRANCE) 100  MG capsule Take 1 capsule (100 mg total) by mouth daily with breakfast. Take whole with food. Take for 21 days on, 7  days off, repeat Q28 days. 21 capsule 5  . pantoprazole (PROTONIX) 40 MG tablet Take 40 mg by mouth 2 (two) times daily.   11  . SYNTHROID 88 MCG tablet Take 88 mcg by mouth daily.  11   No current facility-administered medications for this visit.     Previous Psychotropic Medications: as reported  Substance Abuse History in the last 12 months:  No.  Consequences of Substance Abuse: Negative NA  Medical Decision Making:  Review of Psycho-Social Stressors (1) and Review and summation of old records (2)  Treatment Plan Summary: Medication management   Medication refills for 90-day supply. Continue  Cymbalta 60 mg daily D/c  BuSpar  Continue Valium 1.25 mg as needed as patient already has a supply. She will follow-up in 2 months or earlier depending on her symptoms   More than 50% of the time spent in psychoeducation, counseling and coordination of care.    This note was generated in part or whole with voice recognition software. Voice regonition is usually quite accurate but there are transcription errors that can and very often do occur. I apologize for any typographical errors that were not detected and corrected.   Rainey Pines, MD   9/16/201910:50 AM

## 2017-11-28 DIAGNOSIS — R0602 Shortness of breath: Secondary | ICD-10-CM | POA: Diagnosis not present

## 2017-11-28 DIAGNOSIS — R079 Chest pain, unspecified: Secondary | ICD-10-CM | POA: Diagnosis not present

## 2017-12-04 DIAGNOSIS — C7802 Secondary malignant neoplasm of left lung: Secondary | ICD-10-CM | POA: Diagnosis not present

## 2017-12-04 DIAGNOSIS — E785 Hyperlipidemia, unspecified: Secondary | ICD-10-CM | POA: Diagnosis not present

## 2017-12-04 DIAGNOSIS — M5416 Radiculopathy, lumbar region: Secondary | ICD-10-CM | POA: Diagnosis not present

## 2017-12-04 DIAGNOSIS — Z0000 Encounter for general adult medical examination without abnormal findings: Secondary | ICD-10-CM | POA: Diagnosis not present

## 2017-12-04 DIAGNOSIS — C50919 Malignant neoplasm of unspecified site of unspecified female breast: Secondary | ICD-10-CM | POA: Diagnosis not present

## 2017-12-04 DIAGNOSIS — R51 Headache: Secondary | ICD-10-CM | POA: Diagnosis not present

## 2017-12-04 DIAGNOSIS — F3342 Major depressive disorder, recurrent, in full remission: Secondary | ICD-10-CM | POA: Diagnosis not present

## 2017-12-04 DIAGNOSIS — E034 Atrophy of thyroid (acquired): Secondary | ICD-10-CM | POA: Diagnosis not present

## 2017-12-04 DIAGNOSIS — T451X5A Adverse effect of antineoplastic and immunosuppressive drugs, initial encounter: Secondary | ICD-10-CM

## 2017-12-04 DIAGNOSIS — I10 Essential (primary) hypertension: Secondary | ICD-10-CM | POA: Diagnosis not present

## 2017-12-04 DIAGNOSIS — Z853 Personal history of malignant neoplasm of breast: Secondary | ICD-10-CM | POA: Diagnosis not present

## 2017-12-04 DIAGNOSIS — R739 Hyperglycemia, unspecified: Secondary | ICD-10-CM | POA: Diagnosis not present

## 2017-12-04 DIAGNOSIS — K219 Gastro-esophageal reflux disease without esophagitis: Secondary | ICD-10-CM | POA: Diagnosis not present

## 2017-12-04 DIAGNOSIS — M5136 Other intervertebral disc degeneration, lumbar region: Secondary | ICD-10-CM | POA: Diagnosis not present

## 2017-12-04 DIAGNOSIS — D701 Agranulocytosis secondary to cancer chemotherapy: Secondary | ICD-10-CM | POA: Insufficient documentation

## 2017-12-05 MED FILL — IBRANCE 100 MG CAPSULE: 100 | 28 days supply | Qty: 21 | Fill #3

## 2017-12-11 ENCOUNTER — Inpatient Hospital Stay: Payer: PPO | Attending: Oncology

## 2017-12-11 ENCOUNTER — Inpatient Hospital Stay (HOSPITAL_BASED_OUTPATIENT_CLINIC_OR_DEPARTMENT_OTHER): Payer: PPO | Admitting: Oncology

## 2017-12-11 ENCOUNTER — Other Ambulatory Visit: Payer: Self-pay

## 2017-12-11 ENCOUNTER — Encounter: Payer: Self-pay | Admitting: Oncology

## 2017-12-11 VITALS — BP 148/74 | HR 66 | Temp 97.8°F | Resp 18 | Ht 66.0 in | Wt 168.4 lb

## 2017-12-11 DIAGNOSIS — Z90710 Acquired absence of both cervix and uterus: Secondary | ICD-10-CM | POA: Diagnosis not present

## 2017-12-11 DIAGNOSIS — Z800 Family history of malignant neoplasm of digestive organs: Secondary | ICD-10-CM | POA: Diagnosis not present

## 2017-12-11 DIAGNOSIS — Z170 Estrogen receptor positive status [ER+]: Secondary | ICD-10-CM | POA: Insufficient documentation

## 2017-12-11 DIAGNOSIS — E890 Postprocedural hypothyroidism: Secondary | ICD-10-CM | POA: Diagnosis not present

## 2017-12-11 DIAGNOSIS — C50911 Malignant neoplasm of unspecified site of right female breast: Secondary | ICD-10-CM | POA: Insufficient documentation

## 2017-12-11 DIAGNOSIS — Z79899 Other long term (current) drug therapy: Secondary | ICD-10-CM | POA: Diagnosis not present

## 2017-12-11 DIAGNOSIS — Z86718 Personal history of other venous thrombosis and embolism: Secondary | ICD-10-CM | POA: Diagnosis not present

## 2017-12-11 DIAGNOSIS — Z79811 Long term (current) use of aromatase inhibitors: Secondary | ICD-10-CM

## 2017-12-11 DIAGNOSIS — C782 Secondary malignant neoplasm of pleura: Secondary | ICD-10-CM | POA: Insufficient documentation

## 2017-12-11 DIAGNOSIS — Z9011 Acquired absence of right breast and nipple: Secondary | ICD-10-CM | POA: Insufficient documentation

## 2017-12-11 DIAGNOSIS — Z9221 Personal history of antineoplastic chemotherapy: Secondary | ICD-10-CM | POA: Insufficient documentation

## 2017-12-11 DIAGNOSIS — Z9049 Acquired absence of other specified parts of digestive tract: Secondary | ICD-10-CM | POA: Insufficient documentation

## 2017-12-11 DIAGNOSIS — I10 Essential (primary) hypertension: Secondary | ICD-10-CM | POA: Insufficient documentation

## 2017-12-11 DIAGNOSIS — C50919 Malignant neoplasm of unspecified site of unspecified female breast: Secondary | ICD-10-CM

## 2017-12-11 LAB — CBC WITH DIFFERENTIAL/PLATELET
Basophils Absolute: 0 10*3/uL (ref 0–0.1)
Basophils Relative: 1 %
EOS PCT: 1 %
Eosinophils Absolute: 0 10*3/uL (ref 0–0.7)
HCT: 33.3 % — ABNORMAL LOW (ref 35.0–47.0)
Hemoglobin: 11.5 g/dL — ABNORMAL LOW (ref 12.0–16.0)
LYMPHS ABS: 2 10*3/uL (ref 1.0–3.6)
LYMPHS PCT: 48 %
MCH: 33.4 pg (ref 26.0–34.0)
MCHC: 34.6 g/dL (ref 32.0–36.0)
MCV: 96.4 fL (ref 80.0–100.0)
MONO ABS: 0.5 10*3/uL (ref 0.2–0.9)
Monocytes Relative: 13 %
Neutro Abs: 1.5 10*3/uL (ref 1.4–6.5)
Neutrophils Relative %: 37 %
PLATELETS: 148 10*3/uL — AB (ref 150–440)
RBC: 3.45 MIL/uL — ABNORMAL LOW (ref 3.80–5.20)
RDW: 18.7 % — AB (ref 11.5–14.5)
WBC: 4.1 10*3/uL (ref 3.6–11.0)

## 2017-12-11 LAB — COMPREHENSIVE METABOLIC PANEL
ALBUMIN: 3.9 g/dL (ref 3.5–5.0)
ALT: 11 U/L (ref 0–44)
AST: 21 U/L (ref 15–41)
Alkaline Phosphatase: 55 U/L (ref 38–126)
Anion gap: 9 (ref 5–15)
BILIRUBIN TOTAL: 0.4 mg/dL (ref 0.3–1.2)
BUN: 15 mg/dL (ref 8–23)
CHLORIDE: 105 mmol/L (ref 98–111)
CO2: 26 mmol/L (ref 22–32)
Calcium: 8.7 mg/dL — ABNORMAL LOW (ref 8.9–10.3)
Creatinine, Ser: 0.71 mg/dL (ref 0.44–1.00)
GFR calc Af Amer: >60 mL/min (ref >60)
GFR calc non Af Amer: >60 mL/min (ref >60)
GLUCOSE: 83 mg/dL (ref 70–99)
POTASSIUM: 3.7 mmol/L (ref 3.5–5.1)
Sodium: 140 mmol/L (ref 135–145)
Total Protein: 6.8 g/dL (ref 6.5–8.1)

## 2017-12-11 NOTE — Progress Notes (Signed)
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 She underwent mastectomy followed by adjuvant chemotherapy and 5 years of tamoxifen. Patient has been having some low back pain and underwent a CT lumbar spine without contrast on 08/10/2017 which was done by the pain clinic. CT mainly showed age-related degenerative disease but also showed incidental nodular thickening of the posterior left diaphragm concerning for tumor and a CT chest was recommended.  CT chest on 08/14/2017 showed scattered left pleural nodularity with tiny left pleural effusion and prominent left internal mammary and juxtadiaphragmatic lymph nodes which are nonspecific but metastatic disease could not be ruled out.  This was followed by a PET/CT scan on 08/22/2017 which showed numerous small left-sided pleural nodules which were mildly hypermetabolic along with hypermetabolic mediastinal lymph nodes concerning for metastatic disease. Small metastatic nodule in the left upper quadrant partly in the retrocrural space. No findings of pulmonary metastatic or osseous metastatic disease.   Patient underwent FNA of retrocrural LN which was positive for carcinoma but primary could not be ascertained. She then  underwent pleural biopsy which showed metastatic carcinoma consistent with breast primary. ER+HER-2.  Patient started taking Ibrance in August 2019  Interval history-reports ongoing fatigue which is more or less stable.  She does have some mild shortness of breath on exertion as well which is essentially remained stable.  Denies other complaints today denies any nausea vomiting or diarrhea.  Denies any blood in stools ECOG PS- 2 Pain scale- 0 Opioid associated constipation- no  Review of systems- Review of Systems  Constitutional: Positive for malaise/fatigue. Negative for chills, fever and weight loss.  HENT: Negative for congestion, ear discharge and nosebleeds.   Eyes: Negative for blurred vision.  Respiratory: Positive for shortness of breath. Negative for cough, hemoptysis, sputum production and wheezing.   Cardiovascular: Negative for chest pain, palpitations, orthopnea and claudication.  Gastrointestinal: Negative for abdominal pain, blood in stool, constipation, diarrhea, heartburn, melena, nausea and vomiting.  Genitourinary: Negative for dysuria, flank pain, frequency, hematuria and urgency.  Musculoskeletal: Negative for back pain, joint pain and myalgias.  Skin: Negative for rash.  Neurological: Negative for dizziness, tingling, focal weakness, seizures, weakness and headaches.  Endo/Heme/Allergies: Does not bruise/bleed easily.  Psychiatric/Behavioral: Negative for depression and suicidal ideas. The patient does not have insomnia.       Allergies  Allergen Reactions  . Imipramine Tinitus  . Latex Rash  . Penicillins Rash  . Tape Rash     Past Medical History:  Diagnosis Date  . Anxiety   . Atrial fibrillation ([place])   . Breast cancer ([place]) 1999   RT MASTECTOMY  . DDD (degenerative disc disease), cervical   . Depression   . Diverticulosis   . Diverticulosis   . DVT of lower extremity (deep venous thrombosis) ([place])   . Dyspnea   .  GERD (gastroesophageal reflux  disease)   . Goiter   . Headache   . History of chemotherapy 2000   BREAST CA  . Hypertension   . Incontinence   . OSA (obstructive sleep apnea)   . Osteoporosis   . Personal history of chemotherapy 1999   BREAST CA  . Status post chemotherapy    2000 right breast cancer  . Thyroid disease   . UTI (urinary tract infection)   . Vertigo      Past Surgical History:  Procedure Laterality Date  . ABDOMINAL HYSTERECTOMY    . BREAST BIOPSY Left 2006   negative  . CHOLECYSTECTOMY    . COCHLEAR IMPLANT Right   . COLONOSCOPY WITH PROPOFOL N/A 07/24/2016   Procedure: COLONOSCOPY WITH PROPOFOL;  Surgeon: Manya Silvas, MD;  Location: [facility] ENDOSCOPY;  Service: Endoscopy;  Laterality: N/A;  . ESOPHAGOGASTRODUODENOSCOPY (EGD) WITH PROPOFOL N/A 04/12/2016   Procedure: ESOPHAGOGASTRODUODENOSCOPY (EGD) WITH PROPOFOL;  Surgeon: Manya Silvas, MD;  Location: [facility] ENDOSCOPY;  Service: Endoscopy;  Laterality: N/A;  . EYE SURGERY Bilateral    cataract  . FOOT SURGERY Bilateral   . HAND SURGERY Right   . MASTECTOMY Right 1999   BREAST CA  . MASTECTOMY Right 1999  . PARTIAL COLECTOMY    . THYROIDECTOMY    . VIDEO ASSISTED THORACOSCOPY Left 09/10/2017   Procedure: VIDEO ASSISTED THORACOSCOPY;  Surgeon: [name], MD;  Location: [facility] ORS;  Service: Thoracic;  Laterality: Left;  with biopsies  . VIDEO BRONCHOSCOPY  09/10/2017   Procedure: VIDEO BRONCHOSCOPY;  Surgeon: [name], MD;  Location: [facility] ORS;  Service: Thoracic;;    Social History   Socioeconomic History  . Marital status: Married    Spouse name: evert  . Number of children: 4  . Years of education: Not on file  . Highest education level: 9th grade  Occupational History  . Not on file  Social Needs  . Financial resource strain: Somewhat hard  . Food insecurity:    Worry: Often true    Inability: Often true  . Transportation needs:    Medical: No    Non-medical: No  Tobacco Use  . Smoking status: Never Smoker    . Smokeless tobacco: Never Used  Substance and Sexual Activity  . Alcohol use: No    Alcohol/week: 0.0 standard drinks  . Drug use: No  . Sexual activity: Never  Lifestyle  . Physical activity:    Days per week: 0 days    Minutes per session: 0 min  . Stress: Not at all  Relationships  . Social connections:    Talks on phone: Not on file    Gets together: Not on file    Attends religious service: More than 4 times per year    Active member of club or organization: No    Attends meetings of clubs or organizations: Never    Relationship status: Married  . Intimate partner violence:    Fear of current or ex partner: No    Emotionally abused: No    Physically abused: No    Forced sexual activity: No  Other Topics Concern  . Not on file  Social History Narrative  . Not on file    Family History  Problem Relation Age of Onset  . Dementia Mother   . Dementia Sister   . Diabetes Sister   . Heart attack Brother   . COPD Brother   . Colon cancer Brother   . Liver cancer  Brother   . COPD Brother   . Breast cancer Neg Hx      Current Outpatient Medications:  .  atorvastatin (LIPITOR) 40 MG tablet, Take 40 mg by mouth at bedtime., Disp: , Rfl:  .  cholecalciferol (VITAMIN D) 1000 units tablet, Take 1,000 Units by mouth daily., Disp: , Rfl:  .  cyanocobalamin (V-R VITAMIN B-12) 500 MCG tablet, Take 500 mcg by mouth daily. , Disp: , Rfl:  .  DULoxetine (CYMBALTA) 60 MG capsule, Take 1 capsule (60 mg total) by mouth every morning., Disp: 90 capsule, Rfl: 1 .  letrozole (FEMARA) 2.5 MG tablet, Take 1 tablet (2.5 mg total) by mouth daily., Disp: 30 tablet, Rfl: 5 .  oxyCODONE (OXY IR/ROXICODONE) 5 MG immediate release tablet, Take 1 tablet (5 mg total) by mouth every 6 (six) hours as needed for moderate pain or severe pain., Disp: 60 tablet, Rfl: 0 .  palbociclib (IBRANCE) 100 MG capsule, Take 1 capsule (100 mg total) by mouth daily with breakfast. Take whole with food. Take for 21  days on, 7 days off, repeat Q28 days., Disp: 21 capsule, Rfl: 5 .  pantoprazole (PROTONIX) 40 MG tablet, Take 40 mg by mouth 2 (two) times daily. , Disp: , Rfl: 11 .  SYNTHROID 88 MCG tablet, Take 88 mcg by mouth daily., Disp: , Rfl: 11 .  diazepam (VALIUM) 5 MG tablet, Take 1.25 mg prn (Patient not taking: Reported on 12/11/2017), Disp: 60 tablet, Rfl: 0 .  ondansetron (ZOFRAN) 4 MG tablet, Take 1 tablet (4 mg total) by mouth every 6 (six) hours as needed for nausea or vomiting. (Patient not taking: Reported on 12/11/2017), Disp: 30 tablet, Rfl: 1  Physical exam:  Vitals:   12/11/17 0919  BP: (!) 148/74  Pulse: 66  Resp: 18  Temp: 97.8 F (36.6 C)  TempSrc: Tympanic  SpO2: 100%  Weight: 168 lb 6.4 oz (76.4 kg)  Height: _0  (1.676 m)   Physical Exam  Constitutional: She is oriented to person, place, and time. She appears well-developed and well-nourished.  Elderly female who ambulates with a cane.  Appears in no acute distress  HENT:  Head: Normocephalic and atraumatic.  Eyes: Pupils are equal, round, and reactive to light. EOM are normal.  Neck: Normal range of motion.  Cardiovascular: Normal rate, regular rhythm and normal heart sounds.  Pulmonary/Chest: Effort normal and breath sounds normal.  Abdominal: Soft. Bowel sounds are normal.  Neurological: She is alert and oriented to person, place, and time.  Skin: Skin is warm and dry.     CMP Latest Ref Rng & Units 12/11/2017  Glucose 70 - 99 mg/dL 83  BUN 8 - 23 mg/dL 15  Creatinine 0.44 - 1.00 mg/dL 0.71  Sodium 135 - 145 mmol/L 140  Potassium 3.5 - 5.1 mmol/L 3.7  Chloride 98 - 111 mmol/L 105  CO2 22 - 32 mmol/L 26  Calcium 8.9 - 10.3 mg/dL 8.7(L)  Total Protein 6.5 - 8.1 g/dL 6.8  Total Bilirubin 0.3 - 1.2 mg/dL 0.4  Alkaline Phos 38 - 126 U/L 55  AST 15 - 41 U/L 21  ALT 0 - 44 U/L 11   CBC Latest Ref Rng & Units 12/11/2017  WBC 3.6 - 11.0 K/uL 4.1  Hemoglobin 12.0 - 16.0 g/dL 11.5(L)  Hematocrit 35.0 - 47.0 %  33.3(L)  Platelets 150 - 440 K/uL 148(L)      Assessment and plan- Patient is a 77 y.o. female with metastatic ER+ breast cancer  with mets to the pleura and hilar LN. Low volume disease.  She is on letrozole and Ibrance and therefore on treatment assessment prior to cycle 4 of Ibrance  Counts are okay to proceed with cycle 4 of Ibrance today.  She also continues to take letrozole calcium and vitamin D daily.  She does have some mild fatigue which is expected with Ibrance but I do not plan to dose reduce further at this time.  There has been a mild consistent increase in CA-15-3 as well as CA-27-29 levels which is concerning.  I will be getting repeat CT chest abdomen and pelvis with contrast and bone scan after this cycle.  I will briefly see her in 4 weeks time prior to starting cycle 5 of Ibrance.  Patient does have on and off left chest wall pain for which she is on PRN oxycodone and symptoms are well controlled.  Shortness of breath: Followed by pulmonary and cardiology.  Recent stress echocardiogram was within normal limits  I also discussed the results of her on the CT testing which reveals that she has a somatic BRCA mutation in addition to [facility] mutation.  It is unclear if patient has genetic BRCA mutation as she has never been tested in the past.  She does not know anything about her father's side of history but does not report any history of breast ovarian prostate or colon cancer in her mother's side.  As such I will refer her to genetic counseling for BRCA mutation testing given that she has a somatic mutation found in her tumor   Visit Diagnosis 1. Metastatic breast cancer ([place])   2. High risk medication use      Dr. Randa Evens, MD, MPH [facility] [phone] 12/11/2017 1:03 PM

## 2017-12-11 NOTE — Progress Notes (Signed)
No new changes noted today 

## 2017-12-12 LAB — CANCER ANTIGEN 15-3: CA 15-3: 48.2 U/mL — ABNORMAL HIGH (ref 0.0–25.0)

## 2017-12-12 LAB — CANCER ANTIGEN 27.29: CA 27.29: 56.9 U/mL — ABNORMAL HIGH (ref 0.0–38.6)

## 2017-12-14 ENCOUNTER — Telehealth: Payer: Self-pay | Admitting: Pharmacist

## 2017-12-14 DIAGNOSIS — H353221 Exudative age-related macular degeneration, left eye, with active choroidal neovascularization: Secondary | ICD-10-CM | POA: Diagnosis not present

## 2017-12-14 NOTE — Telephone Encounter (Signed)
Oral Chemotherapy Pharmacist Encounter  Follow-Up Form  Called patient today to follow up regarding patient's oral chemotherapy medication: Ibrance (palbociclib)  Original Start date of oral chemotherapy: 09/2017  Pt reports 0 tablets/doses of Ibrance (palbociclib) missed in the last cycle.    Pt reports the following side effects: None reported  Recent labs reviewed: CBC from 12/11/17  New medications?: None reported  Other Issues: voice hoarseness   Patient knows to call the office with questions or concerns. Oral Oncology Clinic will continue to follow.  Darl Pikes, PharmD, BCPS, [facility] Hematology/Oncology Clinical Pharmacist [facility] [phone]  12/14/2017 3:35 PM

## 2017-12-17 DIAGNOSIS — H353211 Exudative age-related macular degeneration, right eye, with active choroidal neovascularization: Secondary | ICD-10-CM | POA: Diagnosis not present

## 2017-12-18 DIAGNOSIS — M1612 Unilateral primary osteoarthritis, left hip: Secondary | ICD-10-CM | POA: Diagnosis not present

## 2017-12-18 DIAGNOSIS — Z96653 Presence of artificial knee joint, bilateral: Secondary | ICD-10-CM | POA: Diagnosis not present

## 2017-12-18 DIAGNOSIS — M1712 Unilateral primary osteoarthritis, left knee: Secondary | ICD-10-CM | POA: Diagnosis not present

## 2017-12-18 DIAGNOSIS — M25562 Pain in left knee: Secondary | ICD-10-CM | POA: Diagnosis not present

## 2017-12-20 DIAGNOSIS — M1612 Unilateral primary osteoarthritis, left hip: Secondary | ICD-10-CM | POA: Diagnosis not present

## 2017-12-28 DIAGNOSIS — H903 Sensorineural hearing loss, bilateral: Secondary | ICD-10-CM | POA: Diagnosis not present

## 2017-12-28 DIAGNOSIS — G4733 Obstructive sleep apnea (adult) (pediatric): Secondary | ICD-10-CM | POA: Diagnosis not present

## 2017-12-28 DIAGNOSIS — R0602 Shortness of breath: Secondary | ICD-10-CM | POA: Diagnosis not present

## 2017-12-28 DIAGNOSIS — C7802 Secondary malignant neoplasm of left lung: Secondary | ICD-10-CM | POA: Diagnosis not present

## 2018-01-01 ENCOUNTER — Ambulatory Visit
Admission: RE | Admit: 2018-01-01 | Discharge: 2018-01-01 | Disposition: A | Discharge status: Home / Self Care | Payer: PPO | Source: Ambulatory Visit | Attending: Oncology | Admitting: Oncology

## 2018-01-01 DIAGNOSIS — J90 Pleural effusion, not elsewhere classified: Secondary | ICD-10-CM | POA: Insufficient documentation

## 2018-01-01 DIAGNOSIS — K5730 Diverticulosis of large intestine without perforation or abscess without bleeding: Secondary | ICD-10-CM | POA: Diagnosis not present

## 2018-01-01 DIAGNOSIS — I700 Atherosclerosis of aorta: Secondary | ICD-10-CM | POA: Diagnosis not present

## 2018-01-01 DIAGNOSIS — R918 Other nonspecific abnormal finding of lung field: Secondary | ICD-10-CM | POA: Diagnosis not present

## 2018-01-01 DIAGNOSIS — K449 Diaphragmatic hernia without obstruction or gangrene: Secondary | ICD-10-CM | POA: Insufficient documentation

## 2018-01-01 DIAGNOSIS — C782 Secondary malignant neoplasm of pleura: Secondary | ICD-10-CM | POA: Diagnosis not present

## 2018-01-01 DIAGNOSIS — C50919 Malignant neoplasm of unspecified site of unspecified female breast: Secondary | ICD-10-CM | POA: Diagnosis not present

## 2018-01-01 DIAGNOSIS — R590 Localized enlarged lymph nodes: Secondary | ICD-10-CM | POA: Insufficient documentation

## 2018-01-01 MED ORDER — TECHNETIUM TC 99M MEDRONATE IV KIT
20.0000 | PACK | Freq: Once | INTRAVENOUS | Status: AC | PRN
  Administered 2018-01-01: 24.189 via INTRAVENOUS

## 2018-01-01 MED ORDER — IOPAMIDOL (ISOVUE-300) INJECTION 61%
100.0000 mL | Freq: Once | INTRAVENOUS | Status: AC | PRN
  Administered 2018-01-01: 100 mL via INTRAVENOUS

## 2018-01-03 MED FILL — IBRANCE 100 MG CAPSULE: 100 | 28 days supply | Qty: 21 | Fill #4

## 2018-01-08 ENCOUNTER — Inpatient Hospital Stay: Payer: PPO

## 2018-01-08 ENCOUNTER — Encounter: Payer: Self-pay | Admitting: Oncology

## 2018-01-08 ENCOUNTER — Inpatient Hospital Stay (HOSPITAL_BASED_OUTPATIENT_CLINIC_OR_DEPARTMENT_OTHER): Payer: PPO | Admitting: Oncology

## 2018-01-08 VITALS — BP 122/79 | HR 67 | Temp 98.2°F | Resp 18 | Ht 66.0 in | Wt 170.0 lb

## 2018-01-08 DIAGNOSIS — C771 Secondary and unspecified malignant neoplasm of intrathoracic lymph nodes: Secondary | ICD-10-CM

## 2018-01-08 DIAGNOSIS — Z79811 Long term (current) use of aromatase inhibitors: Secondary | ICD-10-CM

## 2018-01-08 DIAGNOSIS — C50919 Malignant neoplasm of unspecified site of unspecified female breast: Secondary | ICD-10-CM

## 2018-01-08 DIAGNOSIS — C782 Secondary malignant neoplasm of pleura: Secondary | ICD-10-CM | POA: Diagnosis not present

## 2018-01-08 DIAGNOSIS — Z79899 Other long term (current) drug therapy: Secondary | ICD-10-CM

## 2018-01-08 DIAGNOSIS — C50911 Malignant neoplasm of unspecified site of right female breast: Secondary | ICD-10-CM

## 2018-01-08 LAB — COMPREHENSIVE METABOLIC PANEL
ALBUMIN: 3.8 g/dL (ref 3.5–5.0)
ALT: 13 U/L (ref 0–44)
AST: 23 U/L (ref 15–41)
Alkaline Phosphatase: 55 U/L (ref 38–126)
Anion gap: 4 — ABNORMAL LOW (ref 5–15)
BUN: 10 mg/dL (ref 8–23)
CHLORIDE: 106 mmol/L (ref 98–111)
CO2: 27 mmol/L (ref 22–32)
Calcium: 8.8 mg/dL — ABNORMAL LOW (ref 8.9–10.3)
Creatinine, Ser: 0.75 mg/dL (ref 0.44–1.00)
GFR calc Af Amer: >60 mL/min (ref >60)
GFR calc non Af Amer: >60 mL/min (ref >60)
GLUCOSE: 101 mg/dL — AB (ref 70–99)
POTASSIUM: 3.6 mmol/L (ref 3.5–5.1)
SODIUM: 137 mmol/L (ref 135–145)
Total Bilirubin: 0.8 mg/dL (ref 0.3–1.2)
Total Protein: 6.8 g/dL (ref 6.5–8.1)

## 2018-01-08 LAB — CBC WITH DIFFERENTIAL/PLATELET
Abs Immature Granulocytes: 0.01 10*3/uL (ref 0.00–0.07)
BASOS ABS: 0.1 10*3/uL (ref 0.0–0.1)
BASOS PCT: 1 %
EOS ABS: 0 10*3/uL (ref 0.0–0.5)
Eosinophils Relative: 1 %
HEMATOCRIT: 33.2 % — AB (ref 36.0–46.0)
Hemoglobin: 11.1 g/dL — ABNORMAL LOW (ref 12.0–15.0)
Immature Granulocytes: 0 %
LYMPHS ABS: 2.2 10*3/uL (ref 0.7–4.0)
Lymphocytes Relative: 50 %
MCH: 32.6 pg (ref 26.0–34.0)
MCHC: 33.4 g/dL (ref 30.0–36.0)
MCV: 97.4 fL (ref 80.0–100.0)
Monocytes Absolute: 0.6 10*3/uL (ref 0.1–1.0)
Monocytes Relative: 13 %
NEUTROS PCT: 35 %
NRBC: 0 % (ref 0.0–0.2)
Neutro Abs: 1.6 10*3/uL — ABNORMAL LOW (ref 1.7–7.7)
PLATELETS: 139 10*3/uL — AB (ref 150–400)
RBC: 3.41 MIL/uL — AB (ref 3.87–5.11)
RDW: 14.2 % (ref 11.5–15.5)
WBC: 4.5 10*3/uL (ref 4.0–10.5)

## 2018-01-08 NOTE — Progress Notes (Signed)
No new changes noted today 

## 2018-01-09 LAB — CANCER ANTIGEN 15-3: CA 15-3: 45.9 U/mL — ABNORMAL HIGH (ref 0.0–25.0)

## 2018-01-09 LAB — CA 27.29 (SERIAL MONITOR): CAN 27.29: 53.4 U/mL — AB (ref 0.0–38.6)

## 2018-01-10 NOTE — Progress Notes (Signed)
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 She underwent mastectomy followed by adjuvant chemotherapy and 5 years of tamoxifen. Patient has been having some low back pain and underwent a CT lumbar spine without contrast on 08/10/2017 which was done by the pain clinic. CT mainly showed age-related degenerative disease but also showed incidental nodular thickening of the posterior left diaphragm concerning for tumor and a CT chest was recommended.  CT chest on 08/14/2017 showed scattered left pleural nodularity with tiny left pleural effusion and prominent left internal mammary and juxtadiaphragmatic lymph nodes which are nonspecific but metastatic disease could not be ruled out.  This was followed by a PET/CT scan on 08/22/2017 which showed numerous small left-sided pleural nodules which were mildly hypermetabolic along with hypermetabolic mediastinal lymph nodes concerning for metastatic disease. Small metastatic nodule in the left upper quadrant partly in the retrocrural space. No findings of pulmonary metastatic or osseous metastatic disease.   Patient underwent FNA of retrocrural LN which was positive for carcinoma but primary could not be ascertained. She then underwent pleural biopsy which showed  metastatic carcinoma consistent with breast primary. ER+HER-2. Patient started taking Ibrance in August 2019  RBC testing showed [facility] and BRCA mutation  Interval history-patient is tolerating 100 mg of Ibrance 3 weeks on 1 week off along with letrozole well without significant side effects.  She does have some mild chronic fatigue which is unchanged.  Denies any nausea vomiting or diarrhea.  Her appetite is good and she denies any unintentional weight loss.  She has been seeing pulmonary for her shortness of breath which has improved over the last couple of months.  ECOG PS- 2 Pain scale- 0 Opioid associated constipation- no  Review of systems- Review of Systems  Constitutional: Positive for malaise/fatigue.     Allergies  Allergen Reactions  . Imipramine Tinitus  . Latex Rash  . Penicillins Rash  . Tape Rash     Past Medical History:  Diagnosis Date  . Anxiety   . Atrial fibrillation ([place])   . Breast cancer ([place]) 1999   RT MASTECTOMY  . DDD (degenerative disc disease), cervical   . Depression   . Diverticulosis   . Diverticulosis   . DVT of lower extremity (deep venous thrombosis) ([place])   . Dyspnea   . GERD (gastroesophageal reflux disease)   . Goiter   . Headache   . History of chemotherapy 2000   BREAST CA  . Hypertension   . Incontinence   . OSA (obstructive sleep apnea)   . Osteoporosis   . Personal history of chemotherapy 1999   BREAST CA  . Status post chemotherapy    2000 right breast cancer  . Thyroid disease   . UTI (urinary tract infection)   . Vertigo      Past Surgical History:  Procedure Laterality Date  .  ABDOMINAL HYSTERECTOMY    . BREAST BIOPSY Left 2006   negative  . CHOLECYSTECTOMY    . COCHLEAR IMPLANT Right   . COLONOSCOPY WITH PROPOFOL N/A 07/24/2016   Procedure: COLONOSCOPY WITH PROPOFOL;  Surgeon: Manya Silvas, MD;  Location: [facility] ENDOSCOPY;  Service: Endoscopy;  Laterality: N/A;  . ESOPHAGOGASTRODUODENOSCOPY (EGD) WITH  PROPOFOL N/A 04/12/2016   Procedure: ESOPHAGOGASTRODUODENOSCOPY (EGD) WITH PROPOFOL;  Surgeon: Manya Silvas, MD;  Location: [facility] ENDOSCOPY;  Service: Endoscopy;  Laterality: N/A;  . EYE SURGERY Bilateral    cataract  . FOOT SURGERY Bilateral   . HAND SURGERY Right   . MASTECTOMY Right 1999   BREAST CA  . MASTECTOMY Right 1999  . PARTIAL COLECTOMY    . THYROIDECTOMY    . VIDEO ASSISTED THORACOSCOPY Left 09/10/2017   Procedure: VIDEO ASSISTED THORACOSCOPY;  Surgeon: [name], MD;  Location: [facility] ORS;  Service: Thoracic;  Laterality: Left;  with biopsies  . VIDEO BRONCHOSCOPY  09/10/2017   Procedure: VIDEO BRONCHOSCOPY;  Surgeon: [name], MD;  Location: [facility] ORS;  Service: Thoracic;;    Social History   Socioeconomic History  . Marital status: Married    Spouse name: evert  . Number of children: 4  . Years of education: Not on file  . Highest education level: 9th grade  Occupational History  . Not on file  Social Needs  . Financial resource strain: Somewhat hard  . Food insecurity:    Worry: Often true    Inability: Often true  . Transportation needs:    Medical: No    Non-medical: No  Tobacco Use  . Smoking status: Never Smoker  . Smokeless tobacco: Never Used  Substance and Sexual Activity  . Alcohol use: No    Alcohol/week: 0.0 standard drinks  . Drug use: No  . Sexual activity: Never  Lifestyle  . Physical activity:    Days per week: 0 days    Minutes per session: 0 min  . Stress: Not at all  Relationships  . Social connections:    Talks on phone: Not on file    Gets together: Not on file    Attends religious service: More than 4 times per year    Active member of club or organization: No    Attends meetings of clubs or organizations: Never    Relationship status: Married  . Intimate partner violence:    Fear of current or ex partner: No    Emotionally abused: No    Physically abused: No    Forced sexual activity: No  Other Topics Concern  .  Not on file  Social History Narrative  . Not on file    Family History  Problem Relation Age of Onset  . Dementia Mother   . Dementia Sister   . Diabetes Sister   . Heart attack Brother   . COPD Brother   . Colon cancer Brother   . Liver cancer Brother   . COPD Brother   . Breast cancer Neg Hx      Current Outpatient Medications:  .  atorvastatin (LIPITOR) 40 MG tablet, Take 40 mg by mouth at bedtime., Disp: , Rfl:  .  cholecalciferol (VITAMIN D) 1000 units tablet, Take 1,000 Units by mouth daily., Disp: , Rfl:  .  cyanocobalamin (V-R VITAMIN B-12) 500 MCG tablet, Take 500 mcg by mouth daily. , Disp: , Rfl:  .  DULoxetine (CYMBALTA) 60 MG capsule, Take 1 capsule (60 mg total) by mouth every morning.,  Disp: 90 capsule, Rfl: 1 .  letrozole (FEMARA) 2.5 MG tablet, Take 1 tablet (2.5 mg total) by mouth daily., Disp: 30 tablet, Rfl: 5 .  oxyCODONE (OXY IR/ROXICODONE) 5 MG immediate release tablet, Take 1 tablet (5 mg total) by mouth every 6 (six) hours as needed for moderate pain or severe pain., Disp: 60 tablet, Rfl: 0 .  palbociclib (IBRANCE) 100 MG capsule, Take 1 capsule (100 mg total) by mouth daily with breakfast. Take whole with food. Take for 21 days on, 7 days off, repeat Q28 days., Disp: 21 capsule, Rfl: 5 .  pantoprazole (PROTONIX) 40 MG tablet, Take 40 mg by mouth 2 (two) times daily. , Disp: , Rfl: 11 .  SYNTHROID 88 MCG tablet, Take 88 mcg by mouth daily., Disp: , Rfl: 11 .  diazepam (VALIUM) 5 MG tablet, Take 1.25 mg prn (Patient not taking: Reported on 12/11/2017), Disp: 60 tablet, Rfl: 0 .  ondansetron (ZOFRAN) 4 MG tablet, Take 1 tablet (4 mg total) by mouth every 6 (six) hours as needed for nausea or vomiting. (Patient not taking: Reported on 12/11/2017), Disp: 30 tablet, Rfl: 1  Physical exam:  Vitals:   01/08/18 1142  BP: 122/79  Pulse: 67  Resp: 18  Temp: 98.2 F (36.8 C)  TempSrc: Oral  SpO2: 100%  Weight: 170 lb (77.1 kg)  Height: _0  (1.676 m)    Physical Exam  Constitutional: She is oriented to person, place, and time. She appears well-developed and well-nourished.  She appears in no acute distress  HENT:  Head: Normocephalic and atraumatic.  Eyes: Pupils are equal, round, and reactive to light. EOM are normal.  Neck: Normal range of motion.  Cardiovascular: Normal rate, regular rhythm and normal heart sounds.  Pulmonary/Chest: Effort normal and breath sounds normal.  Abdominal: Soft. Bowel sounds are normal.  Neurological: She is alert and oriented to person, place, and time.  Skin: Skin is warm and dry.     CMP Latest Ref Rng & Units 01/08/2018  Glucose 70 - 99 mg/dL 101(H)  BUN 8 - 23 mg/dL 10  Creatinine 0.44 - 1.00 mg/dL 0.75  Sodium 135 - 145 mmol/L 137  Potassium 3.5 - 5.1 mmol/L 3.6  Chloride 98 - 111 mmol/L 106  CO2 22 - 32 mmol/L 27  Calcium 8.9 - 10.3 mg/dL 8.8(L)  Total Protein 6.5 - 8.1 g/dL 6.8  Total Bilirubin 0.3 - 1.2 mg/dL 0.8  Alkaline Phos 38 - 126 U/L 55  AST 15 - 41 U/L 23  ALT 0 - 44 U/L 13   CBC Latest Ref Rng & Units 01/08/2018  WBC 4.0 - 10.5 K/uL 4.5  Hemoglobin 12.0 - 15.0 g/dL 11.1(L)  Hematocrit 36.0 - 46.0 % 33.2(L)  Platelets 150 - 400 K/uL 139(L)    No images are attached to the encounter.  Ct Chest W Contrast  Result Date: 01/01/2018 CLINICAL DATA:  Recurrent metastatic breast cancer with pleural and thoracic nodal metastasis. Patient presents for restaging with ongoing chemotherapy. EXAM: CT CHEST, ABDOMEN, AND PELVIS WITH CONTRAST TECHNIQUE: Multidetector CT imaging of the chest, abdomen and pelvis was performed following the standard protocol during bolus administration of intravenous contrast. CONTRAST:  141m ISOVUE-300 IOPAMIDOL (ISOVUE-300) INJECTION 61% COMPARISON:  08/22/2017 PET-CT. 08/14/2017 chest CT. 11/09/2012 CT abdomen/pelvis. FINDINGS: CT CHEST FINDINGS Cardiovascular: Normal heart size. No significant pericardial effusion/thickening. Atherosclerotic  nonaneurysmal abdominal aorta. Normal caliber pulmonary arteries. No central pulmonary emboli. Mediastinum/Nodes: No discrete thyroid nodules. Unremarkable esophagus. Surgical clips are noted in the  right axilla. No pathologically enlarged axillary nodes. Top-normal size 0.8 cm left pericardiophrenic node (series 2/image 50), decreased from 1.0 cm on 08/14/2017 chest CT. Previously described asymmetric subcentimeter left internal mammary and left prevascular mediastinal nodes are stable to the decreased. For example a 0.4 cm left internal mammary node (series 2/image 27), previously 0.4 cm, stable. Previously noted high left internal mammary 0.4 cm node is not visualized on today's scan. Previously noted 0.5 cm left prevascular node is decreased to 0.2 cm (series 2/image 32). Previously noted mildly prominent right paratracheal and subcarinal lymph nodes have all decreased in size and are now subcentimeter in size. No new pathologically enlarged mediastinal nodes. No hilar adenopathy. Lungs/Pleura: No pneumothorax. No right pleural effusion. Trace left pleural effusion is stable. Stable calcified subcentimeter left upper lobe granuloma. Stable 0.4 cm nodule along the left major fissure (series 4/image 70). No acute consolidative airspace disease, lung masses or new significant pulmonary nodules. Previously noted posterior lower left pleural nodularity is stable to decreased. For example a 0.6 cm thickness basilar posteromedial left pleural nodule (series 2/image 50), previously 0.6, stable. A 0.2 cm thickness left posterior pleural nodule (series 2/image 40) is decreased from 0.5 cm. No new pleural nodularity. Musculoskeletal: No aggressive appearing focal osseous lesions. Moderate thoracic spondylosis. Status post right mastectomy. CT ABDOMEN PELVIS FINDINGS Hepatobiliary: Normal liver with no liver mass. Cholecystectomy. No biliary ductal dilatation. Pancreas: Normal, with no mass or duct dilation. Spleen: Normal  size. No mass. Adrenals/Urinary Tract: Normal adrenals. Stable subcentimeter hypodense exophytic interpolar left renal cortical lesion, too small to characterize. No new renal lesions. No hydronephrosis. Normal bladder. Stable hyperdense material surrounding the urethra. Stomach/Bowel: Small hiatal hernia. Otherwise normal nondistended stomach. Stable postsurgical changes from subtotal right hemicolectomy with intact appearing ileocolic anastomosis in the right abdomen. Normal caliber small bowel with no small bowel wall thickening. Oral contrast transits to the proximal colon. Prominent stool throughout the remnant large bowel. Mild sigmoid diverticulosis, with no large bowel wall thickening or significant pericolonic fat stranding. Vascular/Lymphatic: Atherosclerotic nonaneurysmal abdominal aorta. Patent portal, splenic, hepatic and renal veins. Previously described 1.5 cm upper left retroperitoneal lymph node associated with the left diaphragmatic crus is decreased to the 1.0 cm (series 2/image 57). No new pathologically enlarged lymph nodes in the abdomen or pelvis. Reproductive: Status post hysterectomy, with no abnormal findings at the vaginal cuff. No adnexal mass. Other: No pneumoperitoneum, ascites or focal fluid collection. Musculoskeletal: No aggressive appearing focal osseous lesions. Mild lumbar spondylosis. IMPRESSION: 1. Partial treatment response. Posterior lower left pleural nodularity is stable to decreased. Stable trace dependent left pleural effusion. Left prevascular, left pericardiophrenic and left internal mammary mediastinal adenopathy is stable to decreased. Left upper retroperitoneal adenopathy associated with the left diaphragmatic crus is decreased. No new or progressive metastatic disease. 2. Chronic findings include: Aortic Atherosclerosis (ICD10-I70.0). Small hiatal hernia. Mild sigmoid diverticulosis. Electronically Signed   By: Ilona Sorrel M.D.   On: 01/01/2018 13:01   Nm Bone  Scan Whole Body  Result Date: 01/01/2018 CLINICAL DATA:  Metastatic breast cancer.  LEFT hip pain. EXAM: NUCLEAR MEDICINE WHOLE BODY BONE SCAN TECHNIQUE: Whole body anterior and posterior images were obtained approximately 3 hours after intravenous injection of radiopharmaceutical. RADIOPHARMACEUTICALS:  24.2 mCi Technetium-92mMDP IV COMPARISON:  CT 01/01/2018 FINDINGS: No focal uptake within the axillary or appendicular skeleton to localize breast cancer skeletal metastasis. Degenerate uptake at the sternomanubrial joint. There is degenerative uptake in the cervical spine. Degenerate uptake in the medial compartment of  the LEFT knee. Uptake in the lateral aspect of the LEFT tibial metaphysis is unusual location for degenerative change. IMPRESSION: 1. No clear evidence of skeletal metastasis. No evidence of metastasis in the axillary skeleton. 2. Uptake in the LEFT tibial metaphysis is unusual location for degenerate change. IF patient has LEFT tibial pain, recommend plain film radiography of the LEFT tibia. Electronically Signed   By: Suzy Bouchard M.D.   On: 01/01/2018 17:04   Ct Abdomen Pelvis W Contrast  Result Date: 01/01/2018 CLINICAL DATA:  Recurrent metastatic breast cancer with pleural and thoracic nodal metastasis. Patient presents for restaging with ongoing chemotherapy. EXAM: CT CHEST, ABDOMEN, AND PELVIS WITH CONTRAST TECHNIQUE: Multidetector CT imaging of the chest, abdomen and pelvis was performed following the standard protocol during bolus administration of intravenous contrast. CONTRAST:  159m ISOVUE-300 IOPAMIDOL (ISOVUE-300) INJECTION 61% COMPARISON:  08/22/2017 PET-CT. 08/14/2017 chest CT. 11/09/2012 CT abdomen/pelvis. FINDINGS: CT CHEST FINDINGS Cardiovascular: Normal heart size. No significant pericardial effusion/thickening. Atherosclerotic nonaneurysmal abdominal aorta. Normal caliber pulmonary arteries. No central pulmonary emboli. Mediastinum/Nodes: No discrete thyroid  nodules. Unremarkable esophagus. Surgical clips are noted in the right axilla. No pathologically enlarged axillary nodes. Top-normal size 0.8 cm left pericardiophrenic node (series 2/image 50), decreased from 1.0 cm on 08/14/2017 chest CT. Previously described asymmetric subcentimeter left internal mammary and left prevascular mediastinal nodes are stable to the decreased. For example a 0.4 cm left internal mammary node (series 2/image 27), previously 0.4 cm, stable. Previously noted high left internal mammary 0.4 cm node is not visualized on today's scan. Previously noted 0.5 cm left prevascular node is decreased to 0.2 cm (series 2/image 32). Previously noted mildly prominent right paratracheal and subcarinal lymph nodes have all decreased in size and are now subcentimeter in size. No new pathologically enlarged mediastinal nodes. No hilar adenopathy. Lungs/Pleura: No pneumothorax. No right pleural effusion. Trace left pleural effusion is stable. Stable calcified subcentimeter left upper lobe granuloma. Stable 0.4 cm nodule along the left major fissure (series 4/image 70). No acute consolidative airspace disease, lung masses or new significant pulmonary nodules. Previously noted posterior lower left pleural nodularity is stable to decreased. For example a 0.6 cm thickness basilar posteromedial left pleural nodule (series 2/image 50), previously 0.6, stable. A 0.2 cm thickness left posterior pleural nodule (series 2/image 40) is decreased from 0.5 cm. No new pleural nodularity. Musculoskeletal: No aggressive appearing focal osseous lesions. Moderate thoracic spondylosis. Status post right mastectomy. CT ABDOMEN PELVIS FINDINGS Hepatobiliary: Normal liver with no liver mass. Cholecystectomy. No biliary ductal dilatation. Pancreas: Normal, with no mass or duct dilation. Spleen: Normal size. No mass. Adrenals/Urinary Tract: Normal adrenals. Stable subcentimeter hypodense exophytic interpolar left renal cortical  lesion, too small to characterize. No new renal lesions. No hydronephrosis. Normal bladder. Stable hyperdense material surrounding the urethra. Stomach/Bowel: Small hiatal hernia. Otherwise normal nondistended stomach. Stable postsurgical changes from subtotal right hemicolectomy with intact appearing ileocolic anastomosis in the right abdomen. Normal caliber small bowel with no small bowel wall thickening. Oral contrast transits to the proximal colon. Prominent stool throughout the remnant large bowel. Mild sigmoid diverticulosis, with no large bowel wall thickening or significant pericolonic fat stranding. Vascular/Lymphatic: Atherosclerotic nonaneurysmal abdominal aorta. Patent portal, splenic, hepatic and renal veins. Previously described 1.5 cm upper left retroperitoneal lymph node associated with the left diaphragmatic crus is decreased to the 1.0 cm (series 2/image 57). No new pathologically enlarged lymph nodes in the abdomen or pelvis. Reproductive: Status post hysterectomy, with no abnormal findings at the vaginal cuff. No  adnexal mass. Other: No pneumoperitoneum, ascites or focal fluid collection. Musculoskeletal: No aggressive appearing focal osseous lesions. Mild lumbar spondylosis. IMPRESSION: 1. Partial treatment response. Posterior lower left pleural nodularity is stable to decreased. Stable trace dependent left pleural effusion. Left prevascular, left pericardiophrenic and left internal mammary mediastinal adenopathy is stable to decreased. Left upper retroperitoneal adenopathy associated with the left diaphragmatic crus is decreased. No new or progressive metastatic disease. 2. Chronic findings include: Aortic Atherosclerosis (ICD10-I70.0). Small hiatal hernia. Mild sigmoid diverticulosis. Electronically Signed   By: Ilona Sorrel M.D.   On: 01/01/2018 13:01     Assessment and plan- Patient is a 77 y.o. 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 Low volume  disease.  She is on letrozole and Ibrance.  She is here for on treatment assessment prior to cycle 5 of Ibrance and to discuss the results of her CT scan and bone scan  I have reviewed CT chest abdomen pelvis as well as bone scan images independently and discussed findings with the patient and her daughter.  Overall pleural nodularity as well as mediastinal adenopathy is stable to decreased.  There are no new lesions noted elsewhere.  CA-15-3 and 2729 have not shown a significant decrease.  They have remained more or less stable since the start of treatment.  However given that her CT scan findings are favorable she will continue Ibrance at 100 mg 3 weeks on 1 week off along with daily letrozole along with calcium and vitamin D.  I will see her back in 4 weeks on 02/04/2018 in [place] prior to the next cycle of Ibrance with CBC CMP and tumor markers.   Visit Diagnosis 1. High risk medication use   2. Metastatic breast cancer [facility])      Dr. Randa Evens, MD, MPH [facility] [phone] 01/10/2018 8:45 AM

## 2018-01-28 ENCOUNTER — Ambulatory Visit: Payer: PPO | Admitting: Psychiatry

## 2018-01-28 DIAGNOSIS — H353211 Exudative age-related macular degeneration, right eye, with active choroidal neovascularization: Secondary | ICD-10-CM | POA: Diagnosis not present

## 2018-01-30 MED FILL — IBRANCE 100 MG CAPSULE: 100 | 28 days supply | Qty: 21 | Fill #5

## 2018-01-31 ENCOUNTER — Inpatient Hospital Stay: Payer: PPO | Attending: Oncology | Admitting: Genetics

## 2018-01-31 ENCOUNTER — Encounter: Payer: Self-pay | Admitting: Genetics

## 2018-01-31 ENCOUNTER — Inpatient Hospital Stay: Payer: PPO

## 2018-01-31 DIAGNOSIS — C50912 Malignant neoplasm of unspecified site of left female breast: Secondary | ICD-10-CM

## 2018-01-31 DIAGNOSIS — Z801 Family history of malignant neoplasm of trachea, bronchus and lung: Secondary | ICD-10-CM | POA: Diagnosis not present

## 2018-01-31 DIAGNOSIS — C782 Secondary malignant neoplasm of pleura: Secondary | ICD-10-CM | POA: Insufficient documentation

## 2018-01-31 DIAGNOSIS — Z79899 Other long term (current) drug therapy: Secondary | ICD-10-CM | POA: Insufficient documentation

## 2018-01-31 DIAGNOSIS — Z9011 Acquired absence of right breast and nipple: Secondary | ICD-10-CM | POA: Insufficient documentation

## 2018-01-31 DIAGNOSIS — Z9221 Personal history of antineoplastic chemotherapy: Secondary | ICD-10-CM | POA: Insufficient documentation

## 2018-01-31 DIAGNOSIS — Z1379 Encounter for other screening for genetic and chromosomal anomalies: Secondary | ICD-10-CM | POA: Diagnosis not present

## 2018-01-31 DIAGNOSIS — C50919 Malignant neoplasm of unspecified site of unspecified female breast: Secondary | ICD-10-CM | POA: Diagnosis not present

## 2018-01-31 DIAGNOSIS — Z800 Family history of malignant neoplasm of digestive organs: Secondary | ICD-10-CM

## 2018-01-31 DIAGNOSIS — Z808 Family history of malignant neoplasm of other organs or systems: Secondary | ICD-10-CM | POA: Diagnosis not present

## 2018-01-31 DIAGNOSIS — Z79811 Long term (current) use of aromatase inhibitors: Secondary | ICD-10-CM | POA: Insufficient documentation

## 2018-01-31 DIAGNOSIS — C50911 Malignant neoplasm of unspecified site of right female breast: Secondary | ICD-10-CM | POA: Insufficient documentation

## 2018-01-31 DIAGNOSIS — Z90710 Acquired absence of both cervix and uterus: Secondary | ICD-10-CM | POA: Insufficient documentation

## 2018-01-31 DIAGNOSIS — E890 Postprocedural hypothyroidism: Secondary | ICD-10-CM | POA: Insufficient documentation

## 2018-01-31 DIAGNOSIS — Z86718 Personal history of other venous thrombosis and embolism: Secondary | ICD-10-CM | POA: Insufficient documentation

## 2018-01-31 DIAGNOSIS — Z9049 Acquired absence of other specified parts of digestive tract: Secondary | ICD-10-CM | POA: Insufficient documentation

## 2018-01-31 DIAGNOSIS — I10 Essential (primary) hypertension: Secondary | ICD-10-CM | POA: Insufficient documentation

## 2018-01-31 DIAGNOSIS — Z170 Estrogen receptor positive status [ER+]: Secondary | ICD-10-CM | POA: Insufficient documentation

## 2018-01-31 NOTE — Progress Notes (Signed)
REFERRING PROVIDER: Sindy Guadeloupe, MD [place], [place] [address]  PRIMARY PROVIDER:  Adin Hector, MD  PRIMARY REASON FOR VISIT:  1. Metastatic breast cancer ([place])   2. Family history of colon cancer   3. Family history of lung cancer   4. Family history of throat cancer   5. Malignant neoplasm of left female breast, unspecified estrogen receptor status, unspecified site of breast ([place])     HISTORY OF PRESENT ILLNESS:   Ms. Calma, a 77 y.o. female, was seen for a [place] cancer genetics consultation at the request of Dr. Janese Banks due to a personal and family history of cancer.  Ms. Stones presents to clinic today to discuss the possibility of a hereditary predisposition to cancer, genetic testing, and to further clarify her future cancer risks, as well as potential cancer risks for family members.   At the age of 14, Ms. Dullea was diagnosed with right breast cancer.  She underwent right mastectomy, chemotherapy, and antiestrogen therapy. In 2019, she presented with metastatic breast cancer. She has undergone chemotherapy.  She had [employer] tumor testing that revealed pathogenic mutations in BRCA2 (Copy number loss), ATM, (copy number loss), and PICKA.   CANCER HISTORY:    Metastatic breast cancer ([place])   08/09/2017 Miscellaneous    Patient presented a 77 year old female with past medical history significant for right breast cancer in 1995.  She underwent mastectomy followed by adjuvant chemotherapy and 5 years of tamoxifen. She was experiencing low back pain and CT imaging was ordered by pain clinic for further evaluation.     08/10/2017 Imaging    08/10/17- CT Lumbar Spine WO Contrast IMPRESSION: 1. Mild for age degenerative disease as described. The canal and foramina appear diffusely patent. No noted progression when compared to 2013. 2. Nodular thickening of the posterior left diaphragm primarily concerning for tumor. Recommend chest CT with contrast.    08/14/2017  Imaging    CT chest w contrast 08/14/2017 IMPRESSION: 1. Scattered mild LEFT pleural nodularity with tiny LEFT pleural effusion and prominent LEFT internal mammary and LEFT juxta diaphragmatic lymph nodes, nonspecific but may represent malignancy/metastatic disease. Consider tissue sampling and/or PET-CT. 2. Small hiatal hernia 3.  Aortic Atherosclerosis (ICD10-I70.0).    08/22/2017 PET scan    08/22/17- PET IMPRESSION: 1. Numerous small left-sided pleural nodules and small but hypermetabolic mediastinal lymph nodes, worrisome for metastatic breast cancer. 2. Small metastatic nodule in the left upper quadrant partly in the retrocrural space. 3. No findings for pulmonary metastatic disease or osseous metastatic disease.    09/04/2017 Initial Biopsy    09/04/17- CT guided FNA of the left retrocrural soft tissue.  Core biopsy could not be performed due to adjacent vascular structures.    09/10/2017 Pathology Results    09/10/17- Pathology: -Metastatic carcinoma consistent with breast origin.  ER positive (> 90%)    09/17/2017 Initial Diagnosis    Metastatic breast cancer ([place])    09/17/2017 -  Chemotherapy    The patient had [No matching medication found in this treatment plan]  for chemotherapy treatment.     09/17/2017 -  Chemotherapy    She initiated Ibrance and letrozole on 09/17/2017.    09/24/2017 Imaging    09/24/2017-bone density T score -1.8 at [facility] considered osteopenic.      HORMONAL RISK FACTORS:  Ovaries intact: no.  Hysterectomy: yes.  Menopausal status: postmenopausal.  Colonoscopy: yes; 2018, 2 polyps, pt reports overall has had normal c-scopes.  Past Medical History:  Diagnosis Date  . Anxiety   . Atrial fibrillation ([place])   . Breast cancer ([place]) 1999   RT MASTECTOMY  . DDD (degenerative disc disease), cervical   . Depression   . Diverticulosis   . Diverticulosis   . DVT of lower extremity (deep venous thrombosis) ([place])   . Dyspnea   . Family history of colon  cancer   . Family history of lung cancer   . Family history of throat cancer   . GERD (gastroesophageal reflux disease)   . Goiter   . Headache   . History of chemotherapy 2000   BREAST CA  . Hypertension   . Incontinence   . OSA (obstructive sleep apnea)   . Osteoporosis   . Personal history of chemotherapy 1999   BREAST CA  . Status post chemotherapy    2000 right breast cancer  . Thyroid disease   . UTI (urinary tract infection)   . Vertigo     Past Surgical History:  Procedure Laterality Date  . ABDOMINAL HYSTERECTOMY    . BREAST BIOPSY Left 2006   negative  . CHOLECYSTECTOMY    . COCHLEAR IMPLANT Right   . COLONOSCOPY WITH PROPOFOL N/A 07/24/2016   Procedure: COLONOSCOPY WITH PROPOFOL;  Surgeon: Manya Silvas, MD;  [facility] END[facility]COPY;  Service: Endoscopy;  Laterality: N/A;  . ESOPHAGOGASTRODUODENOSCOPY (EGD) WITH PROPOFOL N/A 04/12/2016   Procedure: ESOPHAGOGASTRODUODENOSCOPY (EGD) WITH PROPOFOL;  Surgeon: Manya Silvas, MD;  Location: [facility] ENDOSCOPY;  Service: Endoscopy;  Laterality: N/A;  . EYE SURGERY Bilateral    cataract  . FOOT SURGERY Bilateral   . HAND SURGERY Right   . MASTECTOMY Right 1999   BREAST CA  . MASTECTOMY Right 1999  . PARTIAL COLECTOMY    . THYROIDECTOMY    . VIDEO ASSISTED THORACOSCOPY Left 09/10/2017   Procedure: VIDEO ASSISTED THORACOSCOPY;  Surgeon: [name], MD;  Location: [facility] ORS;  Service: Thoracic;  Laterality: Left;  with biopsies  . VIDEO BRONCHOSCOPY  09/10/2017   Procedure: VIDEO BRONCHOSCOPY;  Surgeon: [name], MD;  Location: [facility] ORS;  Service: Thoracic;;    Social History   Socioeconomic History  . Marital status: Married    Spouse name: evert  . Number of children: 4  . Years of education: Not on file  . Highest education level: 9th grade  Occupational History  . Not on file  Social Needs  . Financial resource strain: Somewhat hard  . Food insecurity:    Worry: Often true    Inability: Often true   . Transportation needs:    Medical: No    Non-medical: No  Tobacco Use  . Smoking status: Never Smoker  . Smokeless tobacco: Never Used  Substance and Sexual Activity  . Alcohol use: No    Alcohol/week: 0.0 standard drinks  . Drug use: No  . Sexual activity: Never  Lifestyle  . Physical activity:    Days per week: 0 days    Minutes per session: 0 min  . Stress: Not at all  Relationships  . Social connections:    Talks on phone: Not on file    Gets together: Not on file    Attends religious service: More than 4 times per year    Active member of club or organization: No    Attends meetings of clubs or organizations: Never    Relationship status: Married  Other Topics Concern  . Not on file  Social History Narrative  .  Not on file     FAMILY HISTORY:  We obtained a detailed, 4-generation family history.  Significant diagnoses are listed below: Family History  Problem Relation Age of Onset  . Dementia Mother   . Dementia Sister   . Diabetes Sister   . Heart attack Brother 33  . COPD Brother   . Lung cancer Brother        hx smoking  . Colon cancer Brother   . Liver cancer Brother   . COPD Brother   . Lung cancer Brother        hx smoking  . Breast cancer Neg Hx     Ms. Burr has 1 son who is 33, and 4 daughters in their 20's.  No hx of cancer.  No hx of cancer in grandchildren.  Ms. Gaster had 4 maternal half-brothers and 1 maternal half-sister.  He half sister died with dementia.  2 brothers died of COPD and lung cancer, hx of smoking. 1 brother died at 60 due to a heart attack. No nieces or nephews with hx of cancer.   Ms. Ancrum father: unk, no info about her father and his family.   Ms. Bula mother: died at 63 with Alzheimer's disease.  No hx of cancer.  Maternal Aunts/Uncles: 8 maternal aunts/uncles, no cancer pt is aware of, although had limited information about their health.  Maternal cousins: no hx of cancer she is aware of Maternal grandfather: died  of colon cancer dx over 52 Maternal grandmother:died of throat cancer.   Ms. Hoggard is unaware of previous family history of genetic testing for hereditary cancer risks. Patient's maternal ancestors are of White/Caucasian descent, and paternal ancestors are of White/Caucasian descent. There is no reported Ashkenazi Jewish ancestry. There is no known consanguinity.  GENETIC COUNSELING ASSESSMENT: OLIVETTE BECKMANN is a 77 y.o. female with a personal and family hsitory which is somewhat suggestive of a Hereditary Cancer Predisposition Syndrome. We, therefore, discussed and recommended the following at today's visit.   DISCUSSION: We reviewed the characteristics, features and inheritance patterns of hereditary cancer syndromes. We also discussed genetic testing, including the appropriate family members to test, the process of testing, insurance coverage and turn-around-time for results. We discussed the implications of a negative, positive and/or variant of uncertain significant result. We recommended Ms. Thau pursue genetic testing for the Multi-Cancer gene panel.   The Multi-Cancer Panel offered by [facility] includes sequencing and/or deletion duplication testing of the following 90 genes: AIP, ALK, APC, ATM, AXIN2, BLM, BAP1, BARD1, BMPR1A, BRCA1, BRCA2, BRIP1, BUB1B, CASR, CDC73, CDH1, CDK4, CDKN1B, CDKN1C, CDKN2A, CEBPA, CHEK2, CTNNA1, DICER1, DIS3L2, EGFR, ENG, EPCAM, FH, FLCN, GALNT12, GATA2, GPC3, GREM1, HOXB13, HRAS, KIT, MAX, MEN1, MET, MITF, MLH1, MLH3, MSH2, MSH3, MSH6, MUTYH, NBN, NF1, NF2, NTHL1, PALB2, PDGFRA, PHOX2B, PMS2, POLD1, POLE, POT1, PRKAR1A, PTCH1, PTEN, RAD50, RAD51C, RAD51D, RB1, RECQL4, RET, RNF43, RPS20, RUNX1, SDHA, SDHAF2, SDHB, SDHC, SDHD, SMAD4, SMARCA4, SMARCB1, SMARCE1, STK11, SUFU, TERC, TERT, TMEM127, TP53, TSC1, TSC2, VHL, WRN, WT1  We discussed that only 5-10% of cancers are associated with a Hereditary cancer predisposition syndrome.  One of the most common hereditary  cancer syndromes that increases breast cancer risk is called Hereditary Breast and Ovarian Cancer (HBOC) syndrome.  This syndrome is caused by mutations in the BRCA1 and BRCA2 genes.  This syndrome increases an individual's lifetime risk to develop breast, ovarian, pancreatic, and other types of cancer.  There are also many other cancer predisposition syndromes caused by mutations in several other  genes.    We discussed that if she is found to have a mutation in one of these genes, it may impact future medical management recommendations such as increased cancer screenings and consideration of risk reducing surgeries.  A positive result could also have implications for the patient's family members.  A Negative result would mean we were unable to identify a hereditary component to her cancer, but does not rule out the possibility of a hereditary basis for her cancer.  There could be mutations that are undetectable by current technology, or in genes not yet tested or identified to increase cancer risk.    We discussed the potential to find a Variant of Uncertain Significance or VUS.  These are variants that have not yet been identified as pathogenic or benign, and it is unknown if this variant is associated with increased cancer risk or if this is a normal finding.  Most VUS's are reclassified to benign or likely benign.   It should not be used to make medical management decisions. With time, we suspect the lab will determine the significance of any VUS's identified if any.   Based on Ms. Fana's personal and family history of cancer, she meets medical criteria for genetic testing. Despite that she meets criteria, she may still have an out of pocket cost. The laboratory can provide her with an estimate of her OOP cost.  she was given the contact information for the laboratory if she has further questions. Marland Kitchen   PLAN: After considering the risks, benefits, and limitations, Ms. Crull  provided informed consent to  pursue genetic testing and the blood sample was sent to [facility] for analysis of the Multi-cancer panel. Results should be available within approximately 2-3 weeks' time, at which point they will be disclosed by telephone to Ms. Molock, as will any additional recommendations warranted by these results. Ms. Ohanian will receive a summary of her genetic counseling visit and a copy of her results once available. This information will also be available in Epic. We encouraged Ms. Boyack to remain in contact with cancer genetics annually so that we can continuously update the family history and inform her of any changes in cancer genetics and testing that may be of benefit for her family. Ms. Koslow questions were answered to her satisfaction today. Our contact information was provided should additional questions or concerns arise.  Lastly, we encouraged Ms. Utz to remain in contact with cancer genetics annually so that we can continuously update the family history and inform her of any changes in cancer genetics and testing that may be of benefit for this family.   Ms.  Bares questions were answered to her satisfaction today. Our contact information was provided should additional questions or concerns arise. Thank you for the referral and allowing [place] to share in the care of your patient.   Tana Felts, MS, [facility] Certified Genetic Counselor [email or web address] phone: [phone]  The patient was seen for a total of 35 minutes in face-to-face genetic counseling.  The patient was accompanied today by her son.

## 2018-02-04 ENCOUNTER — Inpatient Hospital Stay (HOSPITAL_BASED_OUTPATIENT_CLINIC_OR_DEPARTMENT_OTHER): Payer: PPO | Admitting: Oncology

## 2018-02-04 ENCOUNTER — Inpatient Hospital Stay: Payer: PPO

## 2018-02-04 ENCOUNTER — Other Ambulatory Visit: Payer: Self-pay

## 2018-02-04 VITALS — BP 126/83 | HR 71 | Temp 97.0°F | Wt 173.9 lb

## 2018-02-04 DIAGNOSIS — E890 Postprocedural hypothyroidism: Secondary | ICD-10-CM | POA: Diagnosis not present

## 2018-02-04 DIAGNOSIS — Z79811 Long term (current) use of aromatase inhibitors: Secondary | ICD-10-CM

## 2018-02-04 DIAGNOSIS — C50911 Malignant neoplasm of unspecified site of right female breast: Secondary | ICD-10-CM

## 2018-02-04 DIAGNOSIS — C782 Secondary malignant neoplasm of pleura: Secondary | ICD-10-CM

## 2018-02-04 DIAGNOSIS — Z79899 Other long term (current) drug therapy: Secondary | ICD-10-CM | POA: Diagnosis not present

## 2018-02-04 DIAGNOSIS — Z170 Estrogen receptor positive status [ER+]: Secondary | ICD-10-CM

## 2018-02-04 DIAGNOSIS — Z9011 Acquired absence of right breast and nipple: Secondary | ICD-10-CM

## 2018-02-04 DIAGNOSIS — Z9221 Personal history of antineoplastic chemotherapy: Secondary | ICD-10-CM | POA: Diagnosis not present

## 2018-02-04 DIAGNOSIS — Z90710 Acquired absence of both cervix and uterus: Secondary | ICD-10-CM | POA: Diagnosis not present

## 2018-02-04 DIAGNOSIS — C50919 Malignant neoplasm of unspecified site of unspecified female breast: Secondary | ICD-10-CM

## 2018-02-04 DIAGNOSIS — Z9049 Acquired absence of other specified parts of digestive tract: Secondary | ICD-10-CM | POA: Diagnosis not present

## 2018-02-04 DIAGNOSIS — I10 Essential (primary) hypertension: Secondary | ICD-10-CM | POA: Diagnosis not present

## 2018-02-04 DIAGNOSIS — Z86718 Personal history of other venous thrombosis and embolism: Secondary | ICD-10-CM | POA: Diagnosis not present

## 2018-02-04 DIAGNOSIS — D649 Anemia, unspecified: Secondary | ICD-10-CM

## 2018-02-04 LAB — CBC WITH DIFFERENTIAL/PLATELET
Abs Immature Granulocytes: 0.01 10*3/uL (ref 0.00–0.07)
BASOS ABS: 0.1 10*3/uL (ref 0.0–0.1)
BASOS PCT: 1 %
EOS ABS: 0 10*3/uL (ref 0.0–0.5)
Eosinophils Relative: 1 %
HCT: 32 % — ABNORMAL LOW (ref 36.0–46.0)
Hemoglobin: 10.9 g/dL — ABNORMAL LOW (ref 12.0–15.0)
IMMATURE GRANULOCYTES: 0 %
Lymphocytes Relative: 40 %
Lymphs Abs: 1.7 10*3/uL (ref 0.7–4.0)
MCH: 33.7 pg (ref 26.0–34.0)
MCHC: 34.1 g/dL (ref 30.0–36.0)
MCV: 99.1 fL (ref 80.0–100.0)
Monocytes Absolute: 0.5 10*3/uL (ref 0.1–1.0)
Monocytes Relative: 12 %
NEUTROS PCT: 46 %
Neutro Abs: 2 10*3/uL (ref 1.7–7.7)
PLATELETS: 136 10*3/uL — AB (ref 150–400)
RBC: 3.23 MIL/uL — AB (ref 3.87–5.11)
RDW: 13.3 % (ref 11.5–15.5)
WBC: 4.3 10*3/uL (ref 4.0–10.5)
nRBC: 0 % (ref 0.0–0.2)

## 2018-02-04 LAB — COMPREHENSIVE METABOLIC PANEL
ALT: 11 U/L (ref 0–44)
ANION GAP: 8 (ref 5–15)
AST: 21 U/L (ref 15–41)
Albumin: 3.7 g/dL (ref 3.5–5.0)
Alkaline Phosphatase: 49 U/L (ref 38–126)
BILIRUBIN TOTAL: 0.6 mg/dL (ref 0.3–1.2)
BUN: 9 mg/dL (ref 8–23)
CO2: 28 mmol/L (ref 22–32)
Calcium: 8.4 mg/dL — ABNORMAL LOW (ref 8.9–10.3)
Chloride: 104 mmol/L (ref 98–111)
Creatinine, Ser: 0.78 mg/dL (ref 0.44–1.00)
Glucose, Bld: 98 mg/dL (ref 70–99)
POTASSIUM: 3.6 mmol/L (ref 3.5–5.1)
Sodium: 140 mmol/L (ref 135–145)
TOTAL PROTEIN: 7 g/dL (ref 6.5–8.1)

## 2018-02-04 MED ORDER — OXYCODONE HCL 5 MG PO TABS: 5.0000 mg | ORAL_TABLET | Freq: Four times a day (QID) | ORAL | 0 refills | Status: DC | PRN

## 2018-02-04 NOTE — Progress Notes (Signed)
Pt here for f/u. States feeling good overall.

## 2018-02-05 NOTE — Progress Notes (Signed)
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 She underwent mastectomy followed by adjuvant chemotherapy and 5 years of tamoxifen. Patient has been having some low back pain and underwent a CT lumbar spine without contrast on 08/10/2017 which was done by the pain clinic. CT mainly showed age-related degenerative disease but also showed incidental nodular thickening of the posterior left diaphragm concerning for tumor and a CT chest was recommended.  CT chest on 08/14/2017 showed scattered left pleural nodularity with tiny left pleural effusion and prominent left internal mammary and juxtadiaphragmatic lymph nodes which are nonspecific but metastatic disease could not be ruled out.  This was followed by a PET/CT scan on 08/22/2017 which showed numerous small left-sided pleural nodules which were mildly hypermetabolic along with hypermetabolic mediastinal lymph nodes concerning for metastatic disease. Small metastatic nodule in the left upper quadrant partly in the retrocrural space. No findings of pulmonary metastatic or osseous metastatic disease.   Patient underwent FNA of retrocrural LN which was positive for carcinoma but primary could not be ascertained. She then underwent  pleural biopsy which showed metastatic carcinoma consistent with breast primary. ER+HER-2. Patient started taking Ibrance in August 2019  RBC testing showed [facility] and BRCA mutation  Interval history-patient mainly reports pain in her left hip which radiates to her left knee.  It makes it difficult for her to walk and she feels at times her left leg gives way.  She has seen orthopedics in the past and went through cortisone shots but that did not help her.  ECOG PS- 2 Pain scale- 4 Opioid associated constipation- no  Review of systems- Review of Systems  Constitutional: Positive for malaise/fatigue. Negative for chills, fever and weight loss.  HENT: Negative for congestion, ear discharge and nosebleeds.   Eyes: Negative for blurred vision.  Respiratory: Negative for cough, hemoptysis, sputum production, shortness of breath and wheezing.   Cardiovascular: Negative for chest pain, palpitations, orthopnea and claudication.  Gastrointestinal: Negative for abdominal pain, blood in stool, constipation, diarrhea, heartburn, melena, nausea and vomiting.  Genitourinary: Negative for dysuria, flank pain, frequency, hematuria and urgency.  Musculoskeletal: Negative for back pain, joint pain and myalgias.  Skin: Negative for rash.  Neurological: Negative for dizziness, tingling, focal weakness, seizures, weakness and headaches.  Endo/Heme/Allergies: Does not bruise/bleed easily.  Psychiatric/Behavioral: Negative for depression and suicidal ideas. The patient does not have insomnia.       Allergies  Allergen Reactions  . Imipramine Tinitus  . Latex Rash  . Penicillins Rash  . Tape Rash     Past Medical History:  Diagnosis Date  . Anxiety   . Atrial fibrillation ([place])   . Breast cancer ([place]) 1999   RT MASTECTOMY  . DDD (degenerative disc disease), cervical   . Depression   . Diverticulosis   . Diverticulosis   . DVT  of lower extremity (deep venous thrombosis) ([place])   . Dyspnea   .  Family history of colon cancer   . Family history of lung cancer   . Family history of throat cancer   . GERD (gastroesophageal reflux disease)   . Goiter   . Headache   . History of chemotherapy 2000   BREAST CA  . Hypertension   . Incontinence   . OSA (obstructive sleep apnea)   . Osteoporosis   . Personal history of chemotherapy 1999   BREAST CA  . Status post chemotherapy    2000 right breast cancer  . Thyroid disease   . UTI (urinary tract infection)   . Vertigo      Past Surgical History:  Procedure Laterality Date  . ABDOMINAL HYSTERECTOMY    . BREAST BIOPSY Left 2006   negative  . CHOLECYSTECTOMY    . COCHLEAR IMPLANT Right   . COLONOSCOPY WITH PROPOFOL N/A 07/24/2016   Procedure: COLONOSCOPY WITH PROPOFOL;  Surgeon: Manya Silvas, MD;  Location: [facility] ENDOSCOPY;  Service: Endoscopy;  Laterality: N/A;  . ESOPHAGOGASTRODUODENOSCOPY (EGD) WITH PROPOFOL N/A 04/12/2016   Procedure: ESOPHAGOGASTRODUODENOSCOPY (EGD) WITH PROPOFOL;  Surgeon: Manya Silvas, MD;  Location: [facility] ENDOSCOPY;  Service: Endoscopy;  Laterality: N/A;  . EYE SURGERY Bilateral    cataract  . FOOT SURGERY Bilateral   . HAND SURGERY Right   . MASTECTOMY Right 1999   BREAST CA  . MASTECTOMY Right 1999  . PARTIAL COLECTOMY    . THYROIDECTOMY    . VIDEO ASSISTED THORACOSCOPY Left 09/10/2017   Procedure: VIDEO ASSISTED THORACOSCOPY;  Surgeon: [name], MD;  Location: [facility] ORS;  Service: Thoracic;  Laterality: Left;  with biopsies  . VIDEO BRONCHOSCOPY  09/10/2017   Procedure: VIDEO BRONCHOSCOPY;  Surgeon: [name], MD;  Location: [facility] ORS;  Service: Thoracic;;    Social History   Socioeconomic History  . Marital status: Married    Spouse name: evert  . Number of children: 4  . Years of education: Not on file  . Highest education level: 9th grade  Occupational History  . Not on file  Social Needs  . Financial resource strain: Somewhat hard  . Food insecurity:    Worry: Often true     Inability: Often true  . Transportation needs:    Medical: No    Non-medical: No  Tobacco Use  . Smoking status: Never Smoker  . Smokeless tobacco: Never Used  Substance and Sexual Activity  . Alcohol use: No    Alcohol/week: 0.0 standard drinks  . Drug use: No  . Sexual activity: Never  Lifestyle  . Physical activity:    Days per week: 0 days    Minutes per session: 0 min  . Stress: Not at all  Relationships  . Social connections:    Talks on phone: Not on file    Gets together: Not on file    Attends religious service: More than 4 times per year    Active member of club or organization: No    Attends meetings of clubs or organizations: Never    Relationship status: Married  . Intimate partner violence:    Fear of current or ex partner: No    Emotionally abused: No    Physically abused: No    Forced sexual activity: No  Other Topics Concern  . Not on file  Social History Narrative  . Not on file    Family History  Problem Relation Age of  Onset  . Dementia Mother   . Dementia Sister   . Diabetes Sister   . Heart attack Brother 74  . COPD Brother   . Lung cancer Brother        hx smoking  . Colon cancer Brother   . Liver cancer Brother   . COPD Brother   . Lung cancer Brother        hx smoking  . Breast cancer Neg Hx      Current Outpatient Medications:  .  atorvastatin (LIPITOR) 40 MG tablet, Take 40 mg by mouth at bedtime., Disp: , Rfl:  .  cholecalciferol (VITAMIN D) 1000 units tablet, Take 1,000 Units by mouth daily., Disp: , Rfl:  .  cyanocobalamin (V-R VITAMIN B-12) 500 MCG tablet, Take 500 mcg by mouth daily. , Disp: , Rfl:  .  DULoxetine (CYMBALTA) 60 MG capsule, Take 1 capsule (60 mg total) by mouth every morning., Disp: 90 capsule, Rfl: 1 .  IBRANCE 100 MG capsule, Take 100 mg by mouth daily with breakfast. , Disp: , Rfl: 5 .  letrozole (FEMARA) 2.5 MG tablet, Take 1 tablet (2.5 mg total) by mouth daily., Disp: 30 tablet, Rfl: 5 .  oxyCODONE  (OXY IR/ROXICODONE) 5 MG immediate release tablet, Take 1 tablet (5 mg total) by mouth every 6 (six) hours as needed for moderate pain or severe pain., Disp: 60 tablet, Rfl: 0 .  pantoprazole (PROTONIX) 40 MG tablet, Take 40 mg by mouth 2 (two) times daily. , Disp: , Rfl: 11 .  SYNTHROID 88 MCG tablet, Take 88 mcg by mouth daily., Disp: , Rfl: 11 .  diazepam (VALIUM) 5 MG tablet, Take 1.25 mg prn (Patient not taking: Reported on 12/11/2017), Disp: 60 tablet, Rfl: 0 .  ondansetron (ZOFRAN) 4 MG tablet, Take 1 tablet (4 mg total) by mouth every 6 (six) hours as needed for nausea or vomiting. (Patient not taking: Reported on 12/11/2017), Disp: 30 tablet, Rfl: 1  Physical exam:  Vitals:   02/04/18 1009  BP: 126/83  Pulse: 71  Temp: (!) 97 F (36.1 C)  TempSrc: Tympanic  Weight: 173 lb 14.4 oz (78.9 kg)   Physical Exam  Constitutional: She is oriented to person, place, and time. She appears well-developed and well-nourished.  Appears fatigued. Ambulates with a cane  HENT:  Head: Normocephalic and atraumatic.  Eyes: Pupils are equal, round, and reactive to light. EOM are normal.  Neck: Normal range of motion.  Cardiovascular: Normal rate, regular rhythm and normal heart sounds.  Pulmonary/Chest: Effort normal and breath sounds normal.  Abdominal: Soft. Bowel sounds are normal.  Neurological: She is alert and oriented to person, place, and time.  Skin: Skin is warm and dry.     CMP Latest Ref Rng & Units 02/04/2018  Glucose 70 - 99 mg/dL 98  BUN 8 - 23 mg/dL 9  Creatinine 0.44 - 1.00 mg/dL 0.78  Sodium 135 - 145 mmol/L 140  Potassium 3.5 - 5.1 mmol/L 3.6  Chloride 98 - 111 mmol/L 104  CO2 22 - 32 mmol/L 28  Calcium 8.9 - 10.3 mg/dL 8.4(L)  Total Protein 6.5 - 8.1 g/dL 7.0  Total Bilirubin 0.3 - 1.2 mg/dL 0.6  Alkaline Phos 38 - 126 U/L 49  AST 15 - 41 U/L 21  ALT 0 - 44 U/L 11   CBC Latest Ref Rng & Units 02/04/2018  WBC 4.0 - 10.5 K/uL 4.3  Hemoglobin 12.0 - 15.0 g/dL 10.9(L)    Hematocrit 36.0 - 46.0 %  32.0(L)  Platelets 150 - 400 K/uL 136(L)      Assessment and plan- Patient is a 77 y.o. 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 Low volume disease.She is on letrozole and Ibrance.  she is here for on treatment assessment prior to cycle 6 of ibrance  Counts okay to proceed with next cycle of Ibrance today.  Overall her counts are stable and her fatigue has been stable. Her left hip pain is unrelated to her breast cancer. Whole body bone scan in oct 2019 showed no bone mets. Ct scans showed treatment response. I have suggested she should follow up with ortho for her hip pain  I will see her back on 12/23 with cbc/ cmp ca 15-3 and ca 27.29 prior to her next cycle of ibrance   Visit Diagnosis 1. Metastatic breast cancer ([place])   2. High risk medication use   3. Normocytic anemia      Dr. Randa Evens, MD, MPH [facility] [id number] 02/05/2018 11:36 AM

## 2018-02-11 ENCOUNTER — Telehealth: Payer: Self-pay | Admitting: *Deleted

## 2018-02-11 ENCOUNTER — Telehealth: Payer: Self-pay | Admitting: Genetics

## 2018-02-11 ENCOUNTER — Encounter: Payer: Self-pay | Admitting: Oncology

## 2018-02-11 NOTE — Telephone Encounter (Signed)
Call returned to [facility] and she repeated back to me to hlod letrozole and Ibrance for 2 weeks prior to surgery and call after surgery to find out when th restart

## 2018-02-11 NOTE — Telephone Encounter (Signed)
Per patient request- gave genetic test results to her daughter Breanna Zhang over the phone.  Copy will be sent in mail as well.   Revealed negative genetic testing.  Revealed that a VUS in CTTNA1 was identified.   This normal result is reassuring and indicates that it is unlikely Breanna Zhang's cancer is due to a hereditary cause.  It is unlikely that there is an increased risk of another cancer due to a mutation in one of these genes.  However, genetic testing is not perfect, and cannot definitively rule out a hereditary cause.  It will be important for her to keep in contact with genetics to learn if any additional testing may be needed in the future.

## 2018-02-11 NOTE — Telephone Encounter (Signed)
Patient has been told she needs hip replacement surgery the first of the year and the daughter is asking if that can occur with her being on chemotherapy. The only thing I see she is on is Femara Please advise.

## 2018-02-11 NOTE — Telephone Encounter (Signed)
She is on femeara and ibrance. She needs to hold ibrance and femara 2 weeks prior to surgery. Get in touch post surgery and we will let her know when to start

## 2018-02-12 ENCOUNTER — Ambulatory Visit: Payer: Self-pay | Admitting: Genetics

## 2018-02-12 ENCOUNTER — Encounter: Payer: Self-pay | Admitting: Genetics

## 2018-02-12 DIAGNOSIS — Z800 Family history of malignant neoplasm of digestive organs: Secondary | ICD-10-CM

## 2018-02-12 DIAGNOSIS — C50912 Malignant neoplasm of unspecified site of left female breast: Secondary | ICD-10-CM

## 2018-02-12 DIAGNOSIS — C50919 Malignant neoplasm of unspecified site of unspecified female breast: Secondary | ICD-10-CM

## 2018-02-12 DIAGNOSIS — Z801 Family history of malignant neoplasm of trachea, bronchus and lung: Secondary | ICD-10-CM

## 2018-02-12 DIAGNOSIS — Z1379 Encounter for other screening for genetic and chromosomal anomalies: Secondary | ICD-10-CM | POA: Insufficient documentation

## 2018-02-12 NOTE — Progress Notes (Signed)
HPI:  Ms. Thomassen was previously seen in the [place] clinic on 01/31/2018 due to a personal and family history of cancer and concerns regarding a hereditary predisposition to cancer. Please refer to our prior cancer genetics clinic note for more information regarding Ms. Postiglione's medical, social and family histories, and our assessment and recommendations, at the time. Ms. Echavarria recent genetic test results were disclosed to her, as well as recommendations warranted by these results. These results and recommendations are discussed in more detail below.  CANCER HISTORY:    Metastatic breast cancer ([place])   08/09/2017 Miscellaneous    Patient presented a 77 year old female with past medical history significant for right breast cancer in 1995.  She underwent mastectomy followed by adjuvant chemotherapy and 5 years of tamoxifen. She was experiencing low back pain and CT imaging was ordered by pain clinic for further evaluation.     08/10/2017 Imaging    08/10/17- CT Lumbar Spine WO Contrast IMPRESSION: 1. Mild for age degenerative disease as described. The canal and foramina appear diffusely patent. No noted progression when compared to 2013. 2. Nodular thickening of the posterior left diaphragm primarily concerning for tumor. Recommend chest CT with contrast.    08/14/2017 Imaging    CT chest w contrast 08/14/2017 IMPRESSION: 1. Scattered mild LEFT pleural nodularity with tiny LEFT pleural effusion and prominent LEFT internal mammary and LEFT juxta diaphragmatic lymph nodes, nonspecific but may represent malignancy/metastatic disease. Consider tissue sampling and/or PET-CT. 2. Small hiatal hernia 3.  Aortic Atherosclerosis (ICD10-I70.0).    08/22/2017 PET scan    08/22/17- PET IMPRESSION: 1. Numerous small left-sided pleural nodules and small but hypermetabolic mediastinal lymph nodes, worrisome for metastatic breast cancer. 2. Small metastatic nodule in the left upper quadrant partly  in the retrocrural space. 3. No findings for pulmonary metastatic disease or osseous metastatic disease.    09/04/2017 Initial Biopsy    09/04/17- CT guided FNA of the left retrocrural soft tissue.  Core biopsy could not be performed due to adjacent vascular structures.    09/10/2017 Pathology Results    09/10/17- Pathology: -Metastatic carcinoma consistent with breast origin.  ER positive (> 90%)    09/17/2017 Initial Diagnosis    Metastatic breast cancer ([place])    09/17/2017 -  Chemotherapy    The patient had [No matching medication found in this treatment plan]  for chemotherapy treatment.     09/17/2017 -  Chemotherapy    She initiated Ibrance and letrozole on 09/17/2017.    09/24/2017 Imaging    09/24/2017-bone density T score -1.8 at [facility] considered osteopenic.      FAMILY HISTORY:  We obtained a detailed, 4-generation family history.  Significant diagnoses are listed below: Family History  Problem Relation Age of Onset  . Dementia Mother   . Dementia Sister   . Diabetes Sister   . Heart attack Brother 77  . COPD Brother   . Lung cancer Brother        hx smoking  . Colon cancer Brother   . Liver cancer Brother   . COPD Brother   . Lung cancer Brother        hx smoking  . Breast cancer Neg Hx     Ms. Agramonte has 1 son who is 14, and 4 daughters in their 54's.  No hx of cancer.  No hx of cancer in grandchildren.  Ms. Beam had 4 maternal half-brothers and 1 maternal half-sister.  He half sister died with dementia.  2 brothers died of COPD and lung cancer, hx of smoking. 1 brother died at 79 due to a heart attack. No nieces or nephews with hx of cancer.   Ms. Viall father: unk, no info about her father and his family.   Ms. Quant mother: died at 31 with Alzheimer's disease.  No hx of cancer.  Maternal Aunts/Uncles: 8 maternal aunts/uncles, no cancer pt is aware of, although had limited information about their health.  Maternal cousins: no hx of cancer she is aware  of Maternal grandfather: died of colon cancer dx over 14 Maternal grandmother:died of throat cancer.   Ms. Landgrebe is unaware of previous family history of genetic testing for hereditary cancer risks. Patient's maternal ancestors are of White/Caucasian descent, and paternal ancestors are of White/Caucasian descent. There is no reported Ashkenazi Jewish ancestry. There is no known consanguinity.  GENETIC TEST RESULTS: Genetic testing performed through [facility]'s Multi-cancer panel reported out on 02/07/2018 showed no pathogenic mutations.   The Multi-Cancer Panel offered by [facility] includes sequencing and/or deletion duplication testing of the following 90 genes: AIP, ALK, APC, ATM, AXIN2, BAP1, BARD1, BLM, BMPR1A, BRCA1, BRCA2, BRIP1, BUB1B, CASR, CDC73, CDH1, CDK4, CDKN1B, CDKN1C, CDKN2A, CEBPA, CHEK2, CTNNA1, DICER1, DIS3L2, EGFR, ENG, EPCAM, FH, FLCN, GALNT12, GATA2, GPC3, GREM1, HOXB13, HRAS, KIT, MAX, MEN1, MET, MITF, MLH1, MLH3, MSH2, MSH3, MSH6, MUTYH, NBN, NF1, NF2, NTHL1, PALB2, PDGFRA, PHOX2B, PMS2, POLD1, POLE, POT1, PRKAR1A, PTCH1, PTEN, RAD50, RAD51C, RAD51D, RB1, RECQL4, RET, RNF43, RPS20, RUNX1, SDHA, SDHAF2, SDHB, SDHC, SDHD, SMAD4, SMARCA4, SMARCB1, SMARCE1, STK11, SUFU, TERC, TERT, TMEM127, TP53, TSC1, TSC2, VHL, WRN, WT1  A variant of uncertain significance (VUS) in a gene called CTNNA1 was also noted. c.[id number]>A (p.[id number])  None of the previously identified somatic mutations were identified in her germline genetic testing.   The test report will be scanned into EPIC and will be located under the Molecular Pathology section of the Results Review tab. A portion of the result report is included below for reference.     We discussed with Ms. Asmus that because current genetic testing is not perfect, it is possible there may be a gene mutation in one of these genes that current testing cannot detect, but that chance is small.  We also discussed, that there could be another gene that  has not yet been discovered, or that we have not yet tested, that is responsible for the cancer diagnoses in the family. It is also possible there is a hereditary cause for the cancer in the family that Ms. Odeh did not inherit and therefore was not identified in her testing.  Therefore, it is important to remain in touch with cancer genetics in the future so that we can continue to offer Ms. Sahagian the most up to date genetic testing.   Regarding the VUS in CTNNA1: At this time, it is unknown if this variant is associated with increased cancer risk or if this is a normal finding, but most variants such as this get reclassified to being inconsequential. It should not be used to make medical management decisions. With time, we suspect the lab will determine the significance of this variant, if any. If we do learn more about it, we will try to contact Ms. Bena to discuss it further. However, it is important to stay in touch with [place] periodically and keep the address and phone number up to date.  ADDITIONAL GENETIC TESTING: We discussed with Ms. Pile that her genetic testing was fairly extensive.  If there  are are genes identified to increase cancer risk that can be analyzed in the future, we would be happy to discuss and coordinate this testing at that time.    CANCER SCREENING RECOMMENDATIONS: Ms. Prest test result is considered negative (normal).  This means that we have not identified a hereditary cause for her personal and family history of cancer at this time.   While reassuring, this does not definitively rule out a hereditary predisposition to cancer. It is still possible that there could be genetic mutations that are undetectable by current technology, or genetic mutations in genes that have not been tested or identified to increase cancer risk.  Therefore, it is recommended she continue to follow the cancer management and screening guidelines provided by her oncology and primary healthcare  provider. An individual's cancer risk is not determined by genetic test results alone.  Overall cancer risk assessment includes additional factors such as personal medical history, family history, etc.  These should be used to make a personalized plan for cancer prevention and surveillance.    Colon cancer screening should be managed based on family history of brother and maternal grandfather with colon cancer.   RECOMMENDATIONS FOR FAMILY MEMBERS:  Relatives in this family might be at some increased risk of developing cancer, over the general population risk, simply due to the family history of cancer.  We recommended women in this family have a yearly mammogram beginning at age 75, or 68 years younger than the earliest onset of cancer, an annual clinical breast exam, and perform monthly breast self-exams. Women in this family should also have a gynecological exam as recommended by their primary provider. All family members should have a colonoscopy as directed by their doctors.  All family members should inform their physicians about the family history of cancer so their doctors can make the most appropriate screening recommendations for them.   FOLLOW-UP: Lastly, we discussed with Ms. Taira that cancer genetics is a rapidly advancing field and it is possible that new genetic tests will be appropriate for her and/or her family members in the future. We encouraged her to remain in contact with cancer genetics on an annual basis so we can update her personal and family histories and let her know of advances in cancer genetics that may benefit this family.   Our contact number was provided. Ms. Lottes questions were answered to her satisfaction, and she knows she is welcome to call us at anytime with additional questions or concerns.   Ferol Luz, MS, [facility] Certified Genetic Counselor [email or web address]

## 2018-02-18 ENCOUNTER — Ambulatory Visit (INDEPENDENT_AMBULATORY_CARE_PROVIDER_SITE_OTHER): Payer: PPO | Admitting: Psychiatry

## 2018-02-18 ENCOUNTER — Encounter: Payer: Self-pay | Admitting: Psychiatry

## 2018-02-18 ENCOUNTER — Other Ambulatory Visit: Payer: Self-pay

## 2018-02-18 VITALS — BP 146/79 | HR 79 | Temp 97.8°F | Wt 174.8 lb

## 2018-02-18 DIAGNOSIS — F0630 Mood disorder due to known physiological condition, unspecified: Secondary | ICD-10-CM

## 2018-02-18 MED ORDER — DULOXETINE HCL 60 MG PO CPEP: 60.0000 mg | ORAL_CAPSULE | Freq: Every morning | ORAL | 1 refills | Status: DC

## 2018-02-18 NOTE — Progress Notes (Signed)
Psychiatric Follow Up MD  Note  Patient Identification: Breanna Zhang MRN:  [id number] Date of Evaluation:  02/18/2018 Referral Source: Dr Bridgett Larsson  Chief Complaint:   Chief Complaint    Follow-up; Medication Refill     Visit Diagnosis:    ICD-10-CM   1. 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 Genetic testing [Z13.79] 02/12/2018  . Family history of colon cancer [Z80.0]   . Family history of lung cancer [Z80.1]   . Family history of throat cancer [Z80.0]   . 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 Chest pain at rest [R07.9] 11/14/2017  . Hyperlipidemia [E78.5] 11/14/2017  . Shortness of breath [R06.02] 11/14/2017  . Metastatic breast cancer ([place]) [C50.919] 09/17/2017  . Lung cancer ([place]) [C34.90] 09/10/2017  . Headache disorder [R51] 02/26/2017  . Arthralgia of both hands [M25.541, M25.542] 05/24/2016  . Personal history of malignant neoplasm of breast [Z85.3] 09/08/2015  . Adult hypothyroidism [E03.9] 04/06/2015  . Blood glucose elevated [R73.9] 09/07/2014  . 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 Clinical depression [F32.9] 06/22/2014  . Acid reflux [K21.9] 06/22/2014  . BP (high blood pressure) [I10] 06/22/2014  . Malignant neoplasm of breast ([place]) [C50.919] 06/22/2014  . Frequent UTI [N39.0] 06/22/2014  . Apnea, sleep [G47.30] 06/22/2014  . Head revolving around [R42] 06/22/2014  . Dizziness [R42] 04/16/2014  . 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 Neuritis or radiculitis due to rupture of lumbar intervertebral disc [M51.16] 10/06/2013  . 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 Lumbar radiculitis [M54.16] 10/06/2013  . Cystocele, midline [N81.11] 11/16/2011  . Female genuine stress incontinence [N39.3] 11/16/2011  . Incomplete bladder emptying [R33.9] 11/16/2011  . 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 Excessive urination at night [R35.1] 11/16/2011  . Neuralgia neuritis, sciatic nerve [M54.30] 11/16/2011  . Urge incontinence of urine [N39.41] 11/16/2011   History of Present Illness:  Patient is a 77 year old married female who presented for follow-up. She stated that she has been followed by her oncologist and the PA on a regular basis.  She has been diagnosed with cancer in her left breast and she has been feeling depressed.  However patient reported that she has been improving slowly and is going to have her CT scan again in January to check on the progress of her chemotherapy.  Patient reported that she is also having difficulty walking and initially she was diagnosed with arthritis but she had recent evaluation by the orthopedist who reported that she has problem with her hip joint and is going to have the replacement surgery in January.  She is scheduled for hip replacement on January 7.  Patient reported that she is looking forward to have improvement in her ambulation after the surgery.  Patient reported that she has been using walker as she has difficulty walking and it is bothering her.  Patient reported that it is taking longer time for her to complete daily chores.  She reported that her husband is very supportive and he helps her around the house.  She reported that she spent the Thanksgiving holidays with her family.  They are also planning to come on the Christmas eve to spend time with her.  Patient reported that she feels depressed as she cannot go to the church as often as she plans to.  However her children takes her when  she decides that she wants to go to the church.    She reported that she is only taking Cymbalta at this time.  She has a stop taking the Valium as it was not helping her.  She denied having any suicidal homicidal ideations or plan.  She is compliant with her medications.  She is able to contract for safety at this time.         Elements:  Severity:  mild. Associated Signs/Symptoms: Depression Symptoms:  loss of energy/fatigue, (Hypo) Manic Symptoms:  none Anxiety Symptoms:  Excessive Worry, Psychotic Symptoms:  none PTSD Symptoms: Negative NA  Past Medical History:  Past Medical History:  Diagnosis Date  . Anxiety   . Atrial fibrillation ([place])   . Breast cancer ([place]) 1999   RT MASTECTOMY  . DDD (degenerative disc disease), cervical   . Depression   . Diverticulosis   . Diverticulosis   . DVT of lower extremity (deep venous thrombosis) ([place])   . Dyspnea   . Family history of colon cancer   . Family history of lung cancer   . Family history of throat cancer   . GERD (gastroesophageal reflux disease)   . Goiter   . Headache   . History of chemotherapy 2000   BREAST CA  . Hypertension   . Incontinence   . OSA (obstructive sleep apnea)   . Osteoporosis   . Personal history of chemotherapy 1999   BREAST CA  . Status post chemotherapy    2000 right breast cancer  . Thyroid disease   . UTI (urinary tract infection)   . Vertigo     Past Surgical History:  Procedure Laterality Date  . ABDOMINAL HYSTERECTOMY    . BREAST BIOPSY Left 2006   negative  . CHOLECYSTECTOMY    . COCHLEAR IMPLANT Right   . COLONOSCOPY WITH PROPOFOL N/A 07/24/2016   Procedure: COLONOSCOPY WITH PROPOFOL;  Surgeon: Manya Silvas, MD;  Location: [facility] ENDOSCOPY;  Service: Endoscopy;  Laterality: N/A;  . ESOPHAGOGASTRODUODENOSCOPY (EGD) WITH PROPOFOL N/A 04/12/2016   Procedure: ESOPHAGOGASTRODUODENOSCOPY (EGD) WITH PROPOFOL;  Surgeon: Manya Silvas, MD;  Location[facility] ENDOSCOPY;  Service: Endoscopy;  Laterality: N/A;  . EYE SURGERY Bilateral    cataract  . FOOT SURGERY Bilateral   . HAND SURGERY Right   . MASTECTOMY Right 1999   BREAST CA  . MASTECTOMY Right 1999  . PARTIAL COLECTOMY    . THYROIDECTOMY    . VIDEO ASSISTED THORACOSCOPY Left 09/10/2017   Procedure: VIDEO ASSISTED THORACOSCOPY;  Surgeon:  [name], MD;  Location: [facility] ORS;  Service: Thoracic;  Laterality: Left;  with biopsies  . VIDEO BRONCHOSCOPY  09/10/2017   Procedure: VIDEO BRONCHOSCOPY;  Surgeon: [name], MD;  Location: [facility] ORS;  Service: Thoracic;;   Family History:  Family History  Problem Relation Age of Onset  . Dementia Mother   . Dementia Sister   . Diabetes Sister   . Heart attack Brother 51  . COPD Brother   . Lung cancer Brother        hx smoking  . Colon cancer Brother   . Liver cancer Brother   . COPD Brother   . Lung cancer Brother        hx smoking  . Colon cancer Maternal Grandfather        dx >.50  . Breast cancer Neg Hx    Social History:   Social History   Socioeconomic History  . Marital status: Married  Spouse name: evert  . Number of children: 4  . Years of education: Not on file  . Highest education level: 9th grade  Occupational History  . Not on file  Social Needs  . Financial resource strain: Somewhat hard  . Food insecurity:    Worry: Often true    Inability: Often true  . Transportation needs:    Medical: No    Non-medical: No  Tobacco Use  . Smoking status: Never Smoker  . Smokeless tobacco: Never Used  Substance and Sexual Activity  . Alcohol use: No    Alcohol/week: 0.0 standard drinks  . Drug use: No  . Sexual activity: Never  Lifestyle  . Physical activity:    Days per week: 0 days    Minutes per session: 0 min  . Stress: Not at all  Relationships  . Social connections:    Talks on phone: Not on file    Gets together: Not on file    Attends religious service: More than 4 times per year    Active member of club or organization: No    Attends meetings of clubs or organizations: Never    Relationship status: Married  Other Topics Concern  . Not on file  Social History Narrative  . Not on file   Additional Social History: Married x 4.  Husband is alcoholic. She is concerned about is behavoir. 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  Has 4 children.   Musculoskeletal: Strength & Muscle Tone: within normal limits Gait & Station: normal Patient leans: N/A  Psychiatric Specialty Exam: Medication Refill     ROS  There were no vitals taken for this visit.There is no height or weight on file to calculate BMI.  General Appearance: Casual  Eye Contact:  Fair  Speech:  Clear and Coherent  Volume:  Normal  Mood:  Anxious  Affect:  Congruent  Thought Process:  Coherent and Goal Directed  Orientation:  Full (Time, Place, and Person)  Thought Content:  WDL  Suicidal Thoughts:  No  Homicidal Thoughts:  No  Memory:  Immediate;   Fair  Judgement:  Fair  Insight:  Fair  Psychomotor Activity:  Normal  Concentration:  Fair  Recall:  [employer] [place]  Language: Fair  Akathisia:  No  Handed:  Right  AIMS (if indicated):    Assets:  Communication Skills Desire for Improvement Physical Health Social Support Transportation  ADL's:  Intact  Cognition: WNL  Sleep:  7-8   Is the patient at risk to self?  No. Has the patient been a risk to self in the past 6 months?  No. Has the patient been a risk to self within the distant past?  No. Is the patient a risk to others?  No. Has the patient been a risk to others in the past 6 months?  No. Has the patient been a risk to others within the distant past?  No.  Allergies:   Allergies  Allergen Reactions  . Imipramine Tinitus  . Latex Rash  . Penicillins Rash  . Tape Rash   Current Medications: Current Outpatient Medications  Medication Sig Dispense Refill  . atorvastatin (LIPITOR) 40 MG tablet Take 40 mg by mouth at bedtime.    Marland Kitchen azelastine (ASTELIN) 0.1 % nasal spray Place into the nose.    Marland Kitchen azithromycin (ZITHROMAX) 250 MG tablet Take 2 tablets (500mg ) by mouth on Day 1. Take 1 tablet (250mg ) by  mouth on Days 2-5.    . cholecalciferol (VITAMIN D) 1000 units tablet Take 1,000 Units by mouth daily.    . cyanocobalamin (V-R VITAMIN  B-12) 500 MCG tablet Take 500 mcg by mouth daily.     . diazepam (VALIUM) 5 MG tablet Take 1.25 mg prn 60 tablet 0  . DULoxetine (CYMBALTA) 60 MG capsule Take 1 capsule (60 mg total) by mouth every morning. 90 capsule 1  . IBRANCE 100 MG capsule Take 100 mg by mouth daily with breakfast.   5  . letrozole (FEMARA) 2.5 MG tablet Take 1 tablet (2.5 mg total) by mouth daily. 30 tablet 5  . ondansetron (ZOFRAN) 4 MG tablet Take 1 tablet (4 mg total) by mouth every 6 (six) hours as needed for nausea or vomiting. 30 tablet 1  . oxyCODONE (OXY IR/ROXICODONE) 5 MG immediate release tablet Take 1 tablet (5 mg total) by mouth every 6 (six) hours as needed for moderate pain or severe pain. 60 tablet 0  . pantoprazole (PROTONIX) 40 MG tablet Take 40 mg by mouth 2 (two) times daily.   11  . SYNTHROID 88 MCG tablet Take 88 mcg by mouth daily.  11   No current facility-administered medications for this visit.     Previous Psychotropic Medications: as reported  Substance Abuse History in the last 12 months:  No.  Consequences of Substance Abuse: Negative NA  Medical Decision Making:  Review of Psycho-Social Stressors (1) and Review and summation of old records (2)  Treatment Plan Summary: Medication management   Medication refills for 90-day supply. Continue  Cymbalta 60 mg daily D/c valium  She will follow-up after her hip replacement surgery.  More than 50% of the time spent in psychoeducation, counseling and coordination of care.    This note was generated in part or whole with voice recognition software. Voice regonition is usually quite accurate but there are transcription errors that can and very often do occur. I apologize for any typographical errors that were not detected and corrected.   Rainey Pines, MD   12/9/20199:56 AM

## 2018-02-21 DIAGNOSIS — H353221 Exudative age-related macular degeneration, left eye, with active choroidal neovascularization: Secondary | ICD-10-CM | POA: Diagnosis not present

## 2018-02-26 ENCOUNTER — Other Ambulatory Visit: Payer: Self-pay | Admitting: Oncology

## 2018-02-26 NOTE — Telephone Encounter (Signed)
)   Ref Range & Units 3wk ago (02/04/18) 75moago (01/08/18) 226mogo (12/11/17) 65m765moo (11/13/17) 65mo677mo (10/30/17) 27mo 20mo(10/18/17) 27mo a722mo7/24/19)  WBC 4.0 - 10.5 K/uL 4.3  4.5  4.1 R 4.2 R 3.7 R 3.9 R 3.8 R  RBC 3.87 - 5.11 MIL/uL 3.23Low   3.41Low   3.45Low  R 3.45Low  R 3.75Low  R 3.70Low  R 3.62Low  R  Hemoglobin 12.0 - 15.0 g/dL 10.9Low   11.1Low   11.5Low  R 11.0Low  R 11.7Low  R 11.4Low  R 10.8Low  R  HCT 36.0 - 46.0 % 32.0Low   33.2Low   33.3Low  R 32.3Low  R 34.5Low  R 33.5Low  R 31.9Low  R  MCV 80.0 - 100.0 fL 99.1  97.4  96.4  93.5  92.1  90.6  88.2   MCH 26.0 - 34.0 pg 33.7  32.6  33.4  32.0  31.2  30.8  29.9   MCHC 30.0 - 36.0 g/dL 34.1  33.4  34.6 R 34.2 R 33.9 R 34.0 R 33.9 R  RDW 11.5 - 15.5 % 13.3  14.2  18.7High  R 19.3High  R 18.7High  R 17.7High  R 13.6 R  Platelets 150 - 400 K/uL 136Low   139Low   148Low  R 136Low  R 327 R 213 R 222 R  nRBC 0.0 - 0.2 % 0.0  0.0        Neutrophils Relative % % 46  35  37  35  33  34  42   Neutro Abs 1.7 - 7.7 K/uL 2.0  1.6Low   1.5 R 1.5 R 1.2Low  R 1.3Low  R 1.6 R  Lymphocytes Relative % 40  50  48  50  58  49  51   Lymphs Abs 0.7 - 4.0 K/uL 1.7  2.2  2.0 R 2.1 R 2.1 R 1.9 R 1.9 R  Monocytes Relative % 12  13  13  13  6  15  5    Monocytes Absolute 0.1 - 1.0 K/uL 0.5  0.6  0.5 R 0.5 R 0.2 R 0.6 R 0.2 R  Eosinophils Relative % 1  1  1  1  1  1  1    Eosinophils Absolute 0.0 - 0.5 K/uL 0.0  0.0  0.0 R 0.0 R 0.0 R 0.0 R 0.0 R  Basophils Relative % 1  1  1  1  2  1  1    Basophils Absolute 0.0 - 0.1 K/uL 0.1  0.1  0.0 R, CM 0.1 R, CM 0.1 R, CM 0.0 R, CM 0.0 R, CM  Immature Granulocytes % 0  0        Abs Immature Granulocytes 0.00 - 0.07 K/uL 0.01  0.01 CM       Comment: [facility] at [facility] [address] [address]  [facility] CLIAlbu[facility]queLAB CH CLIElimLAB CH CLIA[facility]LAB CH CLIH[facility]daleLAB CH CLITuckerLAB      Specimen Collected: 02/04/18 09:48  Last Resulted: 02/04/18 09:59        )   Ref Range & Units 3wk ago (02/04/18) 77mo ag65mo0/29/19) 22mo ago20mo/1/19) 65mo ago 665mo/19) 65mo ago (75mo/19) 27mo ago (8165mo9) 27mo ago (7/37mo9)  Sodium 135 - 145 mmol/L 140  137  140  138  139  139  137   Potassium 3.5 - 5.1  mmol/L 3.6  3.6  3.7  3.9  4.1  3.9  3.7   Chloride 98 - 111 mmol/L 104  106  105  104  106  105  105   CO2 22 - 32 mmol/L 28  27  26  26  25  25  23    Glucose, Bld 70 - 99 mg/dL 98  101High   83  114High   106High   96  100High    BUN 8 - 23 mg/dL 9  10  15  15  12  13  14    Creatinine, Ser 0.44 - 1.00 mg/dL 0.78  0.75  0.71  0.77  0.91  0.82  0.81   Calcium 8.9 - 10.3 mg/dL 8.4Low   8.8Low   8.7Low   8.3Low   8.8Low   8.9  8.3Low    Total Protein 6.5 - 8.1 g/dL 7.0  6.8  6.8  6.8  7.0  7.1  6.7   Albumin 3.5 - 5.0 g/dL 3.7  3.8  3.9  3.8  3.9  3.9  3.6   AST 15 - 41 U/L 21  23  21  21  20  22  21    ALT 0 - 44 U/L 11  13  11  13  12  13  13    Alkaline Phosphatase 38 - 126 U/L 49  55  55  50  50  54  63   Total Bilirubin 0.3 - 1.2 mg/dL 0.6  0.8  0.4  0.7  0.9  0.4  0.5   GFR calc non Af Amer >60 mL/min >60  >60  >60  >60  59Low   >60  >60   GFR calc Af Amer >60 mL/min >60  >60 CM >60 CM >60 CM >60 CM >60 CM >60 CM  Comment: (NOTE)  The eGFR has been calculated using the CKD EPI equation.  This calculation has not been validated in all clinical situations.  eGFR's persistently <60 mL/min signify possible Chronic Kidney  Disease.   Anion gap 5 - 15 8  4Low  CM 9 CM 8 CM 8 CM 9 CM 9 CM  Comment: Performed at [facility], [address], [place], [place] [address]  Resulting Agency  [facility] [place] [facility] [place] [facility] [place] [facility] [place] [facility] [place] [facility] [place] [facility]      Specimen Collected: 02/04/18 09:48  Last Resulted: 02/04/18 10:11

## 2018-03-01 ENCOUNTER — Other Ambulatory Visit: Payer: Self-pay | Admitting: Oncology

## 2018-03-04 ENCOUNTER — Encounter: Payer: Self-pay | Admitting: Oncology

## 2018-03-04 ENCOUNTER — Inpatient Hospital Stay: Payer: PPO

## 2018-03-04 ENCOUNTER — Inpatient Hospital Stay: Payer: PPO | Attending: Oncology | Admitting: Oncology

## 2018-03-04 VITALS — BP 134/76 | HR 76 | Temp 97.4°F | Resp 18 | Ht 66.0 in | Wt 176.0 lb

## 2018-03-04 DIAGNOSIS — C50911 Malignant neoplasm of unspecified site of right female breast: Secondary | ICD-10-CM | POA: Diagnosis not present

## 2018-03-04 DIAGNOSIS — Z79811 Long term (current) use of aromatase inhibitors: Secondary | ICD-10-CM | POA: Diagnosis not present

## 2018-03-04 DIAGNOSIS — C782 Secondary malignant neoplasm of pleura: Secondary | ICD-10-CM

## 2018-03-04 DIAGNOSIS — Z170 Estrogen receptor positive status [ER+]: Secondary | ICD-10-CM | POA: Insufficient documentation

## 2018-03-04 DIAGNOSIS — C50919 Malignant neoplasm of unspecified site of unspecified female breast: Secondary | ICD-10-CM

## 2018-03-04 DIAGNOSIS — Z79899 Other long term (current) drug therapy: Secondary | ICD-10-CM | POA: Insufficient documentation

## 2018-03-04 DIAGNOSIS — Z9011 Acquired absence of right breast and nipple: Secondary | ICD-10-CM | POA: Diagnosis not present

## 2018-03-04 DIAGNOSIS — Z9221 Personal history of antineoplastic chemotherapy: Secondary | ICD-10-CM | POA: Diagnosis not present

## 2018-03-04 LAB — CBC WITH DIFFERENTIAL/PLATELET
Abs Immature Granulocytes: 0.02 10*3/uL (ref 0.00–0.07)
BASOS ABS: 0.1 10*3/uL (ref 0.0–0.1)
BASOS PCT: 2 %
EOS ABS: 0 10*3/uL (ref 0.0–0.5)
Eosinophils Relative: 1 %
HCT: 33.7 % — ABNORMAL LOW (ref 36.0–46.0)
Hemoglobin: 11.2 g/dL — ABNORMAL LOW (ref 12.0–15.0)
IMMATURE GRANULOCYTES: 1 %
Lymphocytes Relative: 42 %
Lymphs Abs: 1.7 10*3/uL (ref 0.7–4.0)
MCH: 33.4 pg (ref 26.0–34.0)
MCHC: 33.2 g/dL (ref 30.0–36.0)
MCV: 100.6 fL — ABNORMAL HIGH (ref 80.0–100.0)
Monocytes Absolute: 0.4 10*3/uL (ref 0.1–1.0)
Monocytes Relative: 11 %
NEUTROS ABS: 1.8 10*3/uL (ref 1.7–7.7)
NEUTROS PCT: 43 %
NRBC: 0 % (ref 0.0–0.2)
PLATELETS: 140 10*3/uL — AB (ref 150–400)
RBC: 3.35 MIL/uL — ABNORMAL LOW (ref 3.87–5.11)
RDW: 13.4 % (ref 11.5–15.5)
WBC: 4 10*3/uL (ref 4.0–10.5)

## 2018-03-04 LAB — COMPREHENSIVE METABOLIC PANEL
ALBUMIN: 3.8 g/dL (ref 3.5–5.0)
ALT: 13 U/L (ref 0–44)
AST: 23 U/L (ref 15–41)
Alkaline Phosphatase: 51 U/L (ref 38–126)
Anion gap: 8 (ref 5–15)
BUN: 11 mg/dL (ref 8–23)
CHLORIDE: 104 mmol/L (ref 98–111)
CO2: 27 mmol/L (ref 22–32)
Calcium: 8.5 mg/dL — ABNORMAL LOW (ref 8.9–10.3)
Creatinine, Ser: 0.82 mg/dL (ref 0.44–1.00)
GFR calc Af Amer: >60 mL/min (ref >60)
GFR calc non Af Amer: >60 mL/min (ref >60)
GLUCOSE: 113 mg/dL — AB (ref 70–99)
POTASSIUM: 3.9 mmol/L (ref 3.5–5.1)
Sodium: 139 mmol/L (ref 135–145)
Total Bilirubin: 0.7 mg/dL (ref 0.3–1.2)
Total Protein: 7 g/dL (ref 6.5–8.1)

## 2018-03-04 NOTE — Progress Notes (Signed)
No new changes noted today 

## 2018-03-06 NOTE — Progress Notes (Signed)
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 She underwent mastectomy followed by adjuvant chemotherapy and 5 years of tamoxifen. Patient has been having some low back pain and underwent a CT lumbar spine without contrast on 08/10/2017 which was done by the pain clinic. CT mainly showed age-related degenerative disease but also showed incidental nodular thickening of the posterior left diaphragm concerning for tumor and a CT chest was recommended.  CT chest on 08/14/2017 showed scattered left pleural nodularity with tiny left pleural effusion and prominent left internal mammary and juxtadiaphragmatic lymph nodes which are nonspecific but metastatic disease could not be ruled out.  This was followed by a PET/CT scan on 08/22/2017 which showed numerous small left-sided pleural nodules which were mildly hypermetabolic along with hypermetabolic mediastinal lymph nodes concerning for metastatic disease. Small metastatic nodule in the left upper quadrant partly in the retrocrural space. No findings of pulmonary metastatic or osseous metastatic disease.   Patient underwent FNA of retrocrural LN which was positive for carcinoma but primary could not be ascertained. She then underwent  pleural biopsy which showed metastatic carcinoma consistent with breast primary. ER+HER-2. Patient started taking Ibrance in August 2019  RBC testing showedPIKCA and BRCA mutation  Scans have shown stable to improved disease so far  Interval history-patient has chronic fatigue which is at her baseline.  She continues to have problems with her left hip and ambulates with a cane.  She plans to undergo hip replacement on March 18, 2018  ECOG PS- 2 Pain scale- 4 Opioid associated constipation- no  Review of systems- Review of Systems  Constitutional: Positive for malaise/fatigue. Negative for chills, fever and weight loss.  HENT: Negative for congestion, ear discharge and nosebleeds.   Eyes: Negative for blurred vision.  Respiratory: Negative for cough, hemoptysis, sputum production, shortness of breath and wheezing.   Cardiovascular: Negative for chest pain, palpitations, orthopnea and claudication.  Gastrointestinal: Negative for abdominal pain, blood in stool, constipation, diarrhea, heartburn, melena, nausea and vomiting.  Genitourinary: Negative for dysuria, flank pain, frequency, hematuria and urgency.  Musculoskeletal: Positive for joint pain. Negative for back pain and myalgias.  Skin: Negative for rash.  Neurological: Negative for dizziness, tingling, focal weakness, seizures, weakness and headaches.  Endo/Heme/Allergies: Does not bruise/bleed easily.  Psychiatric/Behavioral: Negative for depression and suicidal ideas. The patient does not have insomnia.      Allergies  Allergen Reactions  . Imipramine Tinitus  . Latex Rash  . Penicillins Rash  . Tape Rash     Past Medical History:  Diagnosis Date  . Anxiety   . Atrial fibrillation ([place])   . Breast cancer ([place]) 1999   RT MASTECTOMY  . DDD (degenerative disc disease), cervical   . Depression   . Diverticulosis   . Diverticulosis   . DVT of lower extremity (deep venous thrombosis) ([place])   .  Dyspnea   . Family  history of colon cancer   . Family history of lung cancer   . Family history of throat cancer   . GERD (gastroesophageal reflux disease)   . Goiter   . Headache   . History of chemotherapy 2000   BREAST CA  . Hypertension   . Incontinence   . OSA (obstructive sleep apnea)   . Osteoporosis   . Personal history of chemotherapy 1999   BREAST CA  . Status post chemotherapy    2000 right breast cancer  . Thyroid disease   . UTI (urinary tract infection)   . Vertigo      Past Surgical History:  Procedure Laterality Date  . ABDOMINAL HYSTERECTOMY    . BREAST BIOPSY Left 2006   negative  . CHOLECYSTECTOMY    . COCHLEAR IMPLANT Right   . COLONOSCOPY WITH PROPOFOL N/A 07/24/2016   Procedure: COLONOSCOPY WITH PROPOFOL;  Surgeon: Manya Silvas, MD;  Location: [facility] ENDOSCOPY;  Service: Endoscopy;  Laterality: N/A;  . ESOPHAGOGASTRODUODENOSCOPY (EGD) WITH PROPOFOL N/A 04/12/2016   Procedure: ESOPHAGOGASTRODUODENOSCOPY (EGD) WITH PROPOFOL;  Surgeon: Manya Silvas, MD;  Location: [facility] ENDOSCOPY;  Service: Endoscopy;  Laterality: N/A;  . EYE SURGERY Bilateral    cataract  . FOOT SURGERY Bilateral   . HAND SURGERY Right   . MASTECTOMY Right 1999   BREAST CA  . MASTECTOMY Right 1999  . PARTIAL COLECTOMY    . THYROIDECTOMY    . VIDEO ASSISTED THORACOSCOPY Left 09/10/2017   Procedure: VIDEO ASSISTED THORACOSCOPY;  Surgeon: [name], MD;  Location: [facility] ORS;  Service: Thoracic;  Laterality: Left;  with biopsies  . VIDEO BRONCHOSCOPY  09/10/2017   Procedure: VIDEO BRONCHOSCOPY;  Surgeon: [name], MD;  Location: [facility] ORS;  Service: Thoracic;;    Social History   Socioeconomic History  . Marital status: Married    Spouse name: evert  . Number of children: 4  . Years of education: Not on file  . Highest education level: 9th grade  Occupational History  . Not on file  Social Needs  . Financial resource strain: Somewhat hard  . Food insecurity:    Worry: Often true     Inability: Often true  . Transportation needs:    Medical: No    Non-medical: No  Tobacco Use  . Smoking status: Never Smoker  . Smokeless tobacco: Never Used  Substance and Sexual Activity  . Alcohol use: No    Alcohol/week: 0.0 standard drinks  . Drug use: No  . Sexual activity: Never  Lifestyle  . Physical activity:    Days per week: 0 days    Minutes per session: 0 min  . Stress: Not at all  Relationships  . Social connections:    Talks on phone: Not on file    Gets together: Not on file    Attends religious service: More than 4 times per year    Active member of club or organization: No    Attends meetings of clubs or organizations: Never    Relationship status: Married  . Intimate partner violence:    Fear of current or ex partner: No    Emotionally abused: No    Physically abused: No    Forced sexual activity: No  Other Topics Concern  . Not on file  Social History Narrative  . Not on file    Family History  Problem Relation Age of Onset  . Dementia Mother   . Dementia Sister   .  Diabetes Sister   . Heart attack Brother 55  . COPD Brother   . Lung cancer Brother        hx smoking  . Colon cancer Brother   . Liver cancer Brother   . COPD Brother   . Lung cancer Brother        hx smoking  . Colon cancer Maternal Grandfather        dx >.50  . Breast cancer Neg Hx      Current Outpatient Medications:  .  atorvastatin (LIPITOR) 40 MG tablet, Take 40 mg by mouth at bedtime., Disp: , Rfl:  .  cholecalciferol (VITAMIN D) 1000 units tablet, Take 1,000 Units by mouth daily., Disp: , Rfl:  .  cyanocobalamin (V-R VITAMIN B-12) 500 MCG tablet, Take 500 mcg by mouth daily. , Disp: , Rfl:  .  DULoxetine (CYMBALTA) 60 MG capsule, Take 1 capsule (60 mg total) by mouth every morning., Disp: 90 capsule, Rfl: 1 .  IBRANCE 100 MG capsule, TAKE 1 CAPSULE (100 MG TOTAL) BY MOUTH DAILY WITH BREAKFAST. TAKE WHOLE WITH FOOD. TAKE FOR 21 DAYS ON, 7 DAYS OFF, REPEAT EVERY 28  DAYS. (Patient taking differently: Take 100 mg by mouth daily with breakfast. Take for 21 days on and 7 days off, repeat every 28 days), Disp: 21 capsule, Rfl: 5 .  letrozole (FEMARA) 2.5 MG tablet, TAKE 1 TABLET BY MOUTH EVERY DAY, Disp: 90 tablet, Rfl: 1 .  oxyCODONE (OXY IR/ROXICODONE) 5 MG immediate release tablet, Take 1 tablet (5 mg total) by mouth every 6 (six) hours as needed for moderate pain or severe pain., Disp: 60 tablet, Rfl: 0 .  pantoprazole (PROTONIX) 40 MG tablet, Take 40 mg by mouth 2 (two) times daily. , Disp: , Rfl: 11 .  SYNTHROID 88 MCG tablet, Take 88 mcg by mouth daily before breakfast. , Disp: , Rfl: 11 .  traZODone (DESYREL) 100 MG tablet, Take 100 mg by mouth at bedtime., Disp: , Rfl:  .  ondansetron (ZOFRAN) 4 MG tablet, Take 1 tablet (4 mg total) by mouth every 6 (six) hours as needed for nausea or vomiting. (Patient not taking: Reported on 03/04/2018), Disp: 30 tablet, Rfl: 1  Physical exam:  Vitals:   03/04/18 0931  BP: 134/76  Pulse: 76  Resp: 18  Temp: (!) 97.4 F (36.3 C)  TempSrc: Tympanic  SpO2: 97%  Weight: 176 lb 0.6 oz (79.9 kg)  Height: 5' 6" (1.676 m)   Physical Exam Constitutional:      Comments: Elderly female who ambulates with a cane.  Appears in no acute distress  HENT:     Head: Normocephalic and atraumatic.  Eyes:     Pupils: Pupils are equal, round, and reactive to light.  Neck:     Musculoskeletal: Normal range of motion.  Cardiovascular:     Rate and Rhythm: Normal rate and regular rhythm.     Heart sounds: Normal heart sounds.  Pulmonary:     Effort: Pulmonary effort is normal.     Breath sounds: Normal breath sounds.  Abdominal:     General: Bowel sounds are normal.     Palpations: Abdomen is soft.  Skin:    General: Skin is warm and dry.  Neurological:     Mental Status: She is alert and oriented to person, place, and time.      CMP Latest Ref Rng & Units 03/04/2018  Glucose 70 - 99 mg/dL 113(H)  BUN 8 - 23 mg/dL  11  Creatinine 0.44 - 1.00 mg/dL 0.82  Sodium 135 - 145 mmol/L 139  Potassium 3.5 - 5.1 mmol/L 3.9  Chloride 98 - 111 mmol/L 104  CO2 22 - 32 mmol/L 27  Calcium 8.9 - 10.3 mg/dL 8.5(L)  Total Protein 6.5 - 8.1 g/dL 7.0  Total Bilirubin 0.3 - 1.2 mg/dL 0.7  Alkaline Phos 38 - 126 U/L 51  AST 15 - 41 U/L 23  ALT 0 - 44 U/L 13   CBC Latest Ref Rng & Units 03/04/2018  WBC 4.0 - 10.5 K/uL 4.0  Hemoglobin 12.0 - 15.0 g/dL 11.2(L)  Hematocrit 36.0 - 46.0 % 33.7(L)  Platelets 150 - 400 K/uL 140(L)    No images are attached to the encounter.  No results found.   Assessment and plan- Patient is a 77 y.o. 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 Low volume disease.She is on letrozole and Ibrance. She is here for on treatment assessment prior to cycle 7 of Ibrance  Patient is scheduled to undergo left hip replacement on 03/18/2018.  Have therefore asked her to hold off on starting her Ibrance at this time.  She can also hold off on taking the letrozole at this time.  She will restart taking both on letrozole and Ibrance on 03/26/2018.  I will see her back on 12/31/2018 with CBC CMP CA-15-3 and CA-27-29 prior to cycle 8 of Ibrance.  Overall she seems to be responding to her treatment and scans have shown stable to improved disease.  Her tumor markers are slowly trending down.  We will plan to get repeat scans after 8-9 cycles   Visit Diagnosis 1. Metastatic breast cancer [facility])      Dr. Randa Evens, MD, MPH [facility] at [facility] [phone] 03/06/2018 10:24 AM

## 2018-03-07 ENCOUNTER — Encounter
Admission: RE | Admit: 2018-03-07 | Discharge: 2018-03-07 | Disposition: A | Discharge status: Home / Self Care | Payer: PPO | Source: Ambulatory Visit | Attending: Orthopedic Surgery | Admitting: Orthopedic Surgery

## 2018-03-07 ENCOUNTER — Other Ambulatory Visit: Payer: Self-pay

## 2018-03-07 DIAGNOSIS — Z01812 Encounter for preprocedural laboratory examination: Secondary | ICD-10-CM | POA: Diagnosis not present

## 2018-03-07 LAB — URINALYSIS, ROUTINE W REFLEX MICROSCOPIC
Bacteria, UA: NONE SEEN
Bilirubin Urine: NEGATIVE
Glucose, UA: NEGATIVE mg/dL
Ketones, ur: NEGATIVE mg/dL
Leukocytes, UA: NEGATIVE
Nitrite: NEGATIVE
Protein, ur: NEGATIVE mg/dL
Specific Gravity, Urine: 1.003 — ABNORMAL LOW (ref 1.005–1.030)
pH: 5 (ref 5.0–8.0)

## 2018-03-07 LAB — BASIC METABOLIC PANEL
ANION GAP: 6 (ref 5–15)
BUN: 14 mg/dL (ref 8–23)
CHLORIDE: 105 mmol/L (ref 98–111)
CO2: 27 mmol/L (ref 22–32)
Calcium: 8.9 mg/dL (ref 8.9–10.3)
Creatinine, Ser: 0.83 mg/dL (ref 0.44–1.00)
GFR calc non Af Amer: >60 mL/min (ref >60)
Glucose, Bld: 94 mg/dL (ref 70–99)
Potassium: 3.9 mmol/L (ref 3.5–5.1)
Sodium: 138 mmol/L (ref 135–145)

## 2018-03-07 LAB — CBC
HEMATOCRIT: 35.6 % — AB (ref 36.0–46.0)
HEMOGLOBIN: 12 g/dL (ref 12.0–15.0)
MCH: 33.3 pg (ref 26.0–34.0)
MCHC: 33.7 g/dL (ref 30.0–36.0)
MCV: 98.9 fL (ref 80.0–100.0)
NRBC: 0 % (ref 0.0–0.2)
Platelets: 169 10*3/uL (ref 150–400)
RBC: 3.6 MIL/uL — ABNORMAL LOW (ref 3.87–5.11)
RDW: 13.2 % (ref 11.5–15.5)
WBC: 5.7 10*3/uL (ref 4.0–10.5)

## 2018-03-07 LAB — APTT: aPTT: 28 seconds (ref 24–36)

## 2018-03-07 LAB — SURGICAL PCR SCREEN
MRSA, PCR: NEGATIVE
Staphylococcus aureus: NEGATIVE

## 2018-03-07 LAB — SEDIMENTATION RATE: Sed Rate: 30 mm/hr (ref 0–30)

## 2018-03-07 LAB — TYPE AND SCREEN
ABO/RH(D): O POS
Antibody Screen: NEGATIVE

## 2018-03-07 LAB — PROTIME-INR
INR: 1.3
Prothrombin Time: 16.1 seconds — ABNORMAL HIGH (ref 11.4–15.2)

## 2018-03-07 NOTE — Patient Instructions (Signed)
Your procedure is scheduled on: Tuesday 03/19/18  Report to [place]. To find out your arrival time please call [phone] between 1PM - 3PM on Monday 03/18/18.   Remember: Instructions that are not followed completely may result in serious medical risk, up to and including death, or upon the discretion of your surgeon and anesthesiologist your surgery may need to be rescheduled.      _X__ 1. Do not eat food after midnight the night before your procedure.                 No gum chewing or hard candies. You may drink clear liquids up to 2 hours                 before you are scheduled to arrive for your surgery- DO NOT drink clear                 liquids within 2 hours of the start of your surgery.                 Clear Liquids include:  water, apple juice without pulp, clear carbohydrate                 drink such as Clearfast or Gatorade, Black Coffee or Tea (Do not add                 anything to coffee or tea).  __X__2.  On the morning of surgery brush your teeth with toothpaste and water, you may rinse your mouth with mouthwash if you wish.  Do not swallow any toothpaste or mouthwash.      __X__3.  Notify your doctor if there is any change in your medical condition      (cold, fever, infections).     Do not wear jewelry, make-up, hairpins, clips or nail polish. Do not wear lotions, powders, or perfumes. You may wear deodorant. Do not shave 48 hours prior to surgery. Men may shave face and neck. Do not bring valuables to the hospital.    [facility] is not responsible for any belongings or valuables.   Contacts, dentures/partials or body piercings may not be worn into surgery. Bring a case for your contacts, glasses or hearing aids, a denture cup will be supplied.  Leave your suitcase in the car. After surgery it may be brought to your room.  For patients admitted to the hospital, discharge time is determined by  your treatment team.     Please read over the following fact sheets that you were given:   MRSA Information  __X__ Take these medicines the morning of surgery with A SIP OF WATER:     1. DULoxetine (CYMBALTA) 60 MG capsule  2. pantoprazole (PROTONIX) 40 MG tablet  3. SYNTHROID 88 MCG tablet  4. diazepam (VALIUM) 5 MG tablet (if needed)  5. oxyCODONE (OXY IR/ROXICODONE) 5 MG immediate release tablet (if needed)  6. ondansetron (ZOFRAN) 4 MG tablet (if needed)     __X__ Use SAGE wipes as directed    __X__ Stop Anti-inflammatories 7 days before surgery such as Advil, Ibuprofen, Motrin, BC or Goodies Powder, Naprosyn, Naproxen, Aleve, Aspirin, Meloxicam. May take Tylenol if needed for pain or discomfort.    __X__ Stop all herbal supplements, fish oil or vitamin E until after surgery.    __X__ Bring C-Pap to the hospital.

## 2018-03-07 NOTE — Pre-Procedure Instructions (Signed)
Verbal order obtained from Dr. Rudene Christians to change IV abx from Ancef per written order to Cleocin r/t PCN allergy.

## 2018-03-08 LAB — URINE CULTURE: CULTURE: NO GROWTH

## 2018-03-14 ENCOUNTER — Other Ambulatory Visit: Payer: Self-pay | Admitting: Psychiatry

## 2018-03-18 DIAGNOSIS — H353211 Exudative age-related macular degeneration, right eye, with active choroidal neovascularization: Secondary | ICD-10-CM | POA: Diagnosis not present

## 2018-03-18 MED ORDER — CLINDAMYCIN PHOSPHATE 900 MG/50ML IV SOLN
900.0000 mg | Freq: Once | INTRAVENOUS | Status: AC
  Administered 2018-03-19: 900 mg via INTRAVENOUS

## 2018-03-19 ENCOUNTER — Encounter
Admission: RE | Disposition: A | Discharge status: Skilled Nursing Facility | Payer: Self-pay | Source: Home / Self Care | Attending: Orthopedic Surgery

## 2018-03-19 ENCOUNTER — Inpatient Hospital Stay
Admission: RE | Admit: 2018-03-19 | Discharge: 2018-03-22 | DRG: 470 | Disposition: A | Discharge status: Skilled Nursing Facility | Payer: PPO | Attending: Orthopedic Surgery | Admitting: Orthopedic Surgery

## 2018-03-19 ENCOUNTER — Other Ambulatory Visit: Payer: Self-pay

## 2018-03-19 ENCOUNTER — Inpatient Hospital Stay: Payer: PPO

## 2018-03-19 ENCOUNTER — Inpatient Hospital Stay: Payer: PPO | Admitting: Certified Registered"

## 2018-03-19 DIAGNOSIS — R42 Dizziness and giddiness: Secondary | ICD-10-CM | POA: Diagnosis not present

## 2018-03-19 DIAGNOSIS — J90 Pleural effusion, not elsewhere classified: Secondary | ICD-10-CM | POA: Diagnosis not present

## 2018-03-19 DIAGNOSIS — Z96642 Presence of left artificial hip joint: Secondary | ICD-10-CM | POA: Diagnosis not present

## 2018-03-19 DIAGNOSIS — K219 Gastro-esophageal reflux disease without esophagitis: Secondary | ICD-10-CM | POA: Diagnosis present

## 2018-03-19 DIAGNOSIS — F419 Anxiety disorder, unspecified: Secondary | ICD-10-CM | POA: Diagnosis present

## 2018-03-19 DIAGNOSIS — F3340 Major depressive disorder, recurrent, in remission, unspecified: Secondary | ICD-10-CM | POA: Diagnosis not present

## 2018-03-19 DIAGNOSIS — I10 Essential (primary) hypertension: Secondary | ICD-10-CM | POA: Diagnosis present

## 2018-03-19 DIAGNOSIS — E039 Hypothyroidism, unspecified: Secondary | ICD-10-CM | POA: Diagnosis present

## 2018-03-19 DIAGNOSIS — R509 Fever, unspecified: Secondary | ICD-10-CM | POA: Diagnosis not present

## 2018-03-19 DIAGNOSIS — M169 Osteoarthritis of hip, unspecified: Secondary | ICD-10-CM | POA: Diagnosis not present

## 2018-03-19 DIAGNOSIS — Z86718 Personal history of other venous thrombosis and embolism: Secondary | ICD-10-CM | POA: Diagnosis not present

## 2018-03-19 DIAGNOSIS — Z419 Encounter for procedure for purposes other than remedying health state, unspecified: Secondary | ICD-10-CM

## 2018-03-19 DIAGNOSIS — Z801 Family history of malignant neoplasm of trachea, bronchus and lung: Secondary | ICD-10-CM

## 2018-03-19 DIAGNOSIS — Z91040 Latex allergy status: Secondary | ICD-10-CM

## 2018-03-19 DIAGNOSIS — F329 Major depressive disorder, single episode, unspecified: Secondary | ICD-10-CM | POA: Diagnosis not present

## 2018-03-19 DIAGNOSIS — N390 Urinary tract infection, site not specified: Secondary | ICD-10-CM | POA: Diagnosis not present

## 2018-03-19 DIAGNOSIS — M1612 Unilateral primary osteoarthritis, left hip: Secondary | ICD-10-CM | POA: Diagnosis not present

## 2018-03-19 DIAGNOSIS — Z9011 Acquired absence of right breast and nipple: Secondary | ICD-10-CM | POA: Diagnosis not present

## 2018-03-19 DIAGNOSIS — M6281 Muscle weakness (generalized): Secondary | ICD-10-CM | POA: Diagnosis not present

## 2018-03-19 DIAGNOSIS — Z8249 Family history of ischemic heart disease and other diseases of the circulatory system: Secondary | ICD-10-CM

## 2018-03-19 DIAGNOSIS — Z87440 Personal history of urinary (tract) infections: Secondary | ICD-10-CM | POA: Diagnosis not present

## 2018-03-19 DIAGNOSIS — Z7401 Bed confinement status: Secondary | ICD-10-CM | POA: Diagnosis not present

## 2018-03-19 DIAGNOSIS — Z9621 Cochlear implant status: Secondary | ICD-10-CM | POA: Diagnosis not present

## 2018-03-19 DIAGNOSIS — Z90710 Acquired absence of both cervix and uterus: Secondary | ICD-10-CM | POA: Diagnosis not present

## 2018-03-19 DIAGNOSIS — Z79899 Other long term (current) drug therapy: Secondary | ICD-10-CM

## 2018-03-19 DIAGNOSIS — Z79811 Long term (current) use of aromatase inhibitors: Secondary | ICD-10-CM | POA: Diagnosis not present

## 2018-03-19 DIAGNOSIS — Z9221 Personal history of antineoplastic chemotherapy: Secondary | ICD-10-CM

## 2018-03-19 DIAGNOSIS — M5136 Other intervertebral disc degeneration, lumbar region: Secondary | ICD-10-CM | POA: Diagnosis not present

## 2018-03-19 DIAGNOSIS — I4891 Unspecified atrial fibrillation: Secondary | ICD-10-CM | POA: Diagnosis present

## 2018-03-19 DIAGNOSIS — R5381 Other malaise: Secondary | ICD-10-CM | POA: Diagnosis not present

## 2018-03-19 DIAGNOSIS — Z800 Family history of malignant neoplasm of digestive organs: Secondary | ICD-10-CM

## 2018-03-19 DIAGNOSIS — Z808 Family history of malignant neoplasm of other organs or systems: Secondary | ICD-10-CM | POA: Diagnosis not present

## 2018-03-19 DIAGNOSIS — Z96691 Finger-joint replacement of right hand: Secondary | ICD-10-CM | POA: Diagnosis not present

## 2018-03-19 DIAGNOSIS — R600 Localized edema: Secondary | ICD-10-CM | POA: Diagnosis not present

## 2018-03-19 DIAGNOSIS — M810 Age-related osteoporosis without current pathological fracture: Secondary | ICD-10-CM | POA: Diagnosis present

## 2018-03-19 DIAGNOSIS — Z79818 Long term (current) use of other agents affecting estrogen receptors and estrogen levels: Secondary | ICD-10-CM | POA: Diagnosis not present

## 2018-03-19 DIAGNOSIS — G4733 Obstructive sleep apnea (adult) (pediatric): Secondary | ICD-10-CM | POA: Diagnosis present

## 2018-03-19 DIAGNOSIS — G8918 Other acute postprocedural pain: Secondary | ICD-10-CM

## 2018-03-19 DIAGNOSIS — Z853 Personal history of malignant neoplasm of breast: Secondary | ICD-10-CM

## 2018-03-19 DIAGNOSIS — R609 Edema, unspecified: Secondary | ICD-10-CM

## 2018-03-19 DIAGNOSIS — N3000 Acute cystitis without hematuria: Secondary | ICD-10-CM | POA: Diagnosis not present

## 2018-03-19 DIAGNOSIS — Z9049 Acquired absence of other specified parts of digestive tract: Secondary | ICD-10-CM | POA: Diagnosis not present

## 2018-03-19 DIAGNOSIS — Z833 Family history of diabetes mellitus: Secondary | ICD-10-CM

## 2018-03-19 DIAGNOSIS — E785 Hyperlipidemia, unspecified: Secondary | ICD-10-CM | POA: Diagnosis present

## 2018-03-19 DIAGNOSIS — C7802 Secondary malignant neoplasm of left lung: Secondary | ICD-10-CM | POA: Diagnosis not present

## 2018-03-19 DIAGNOSIS — M2550 Pain in unspecified joint: Secondary | ICD-10-CM | POA: Diagnosis not present

## 2018-03-19 DIAGNOSIS — Z471 Aftercare following joint replacement surgery: Secondary | ICD-10-CM | POA: Diagnosis not present

## 2018-03-19 DIAGNOSIS — Z880 Allergy status to penicillin: Secondary | ICD-10-CM

## 2018-03-19 DIAGNOSIS — H905 Unspecified sensorineural hearing loss: Secondary | ICD-10-CM | POA: Diagnosis not present

## 2018-03-19 DIAGNOSIS — Z79891 Long term (current) use of opiate analgesic: Secondary | ICD-10-CM

## 2018-03-19 DIAGNOSIS — Z91048 Other nonmedicinal substance allergy status: Secondary | ICD-10-CM

## 2018-03-19 HISTORY — PX: TOTAL HIP ARTHROPLASTY: SHX124

## 2018-03-19 LAB — ABO/RH: ABO/RH(D): O POS

## 2018-03-19 SURGERY — ARTHROPLASTY, HIP, TOTAL,POSTERIOR APPROACH
Anesthesia: Spinal | Site: Hip | Laterality: Left

## 2018-03-19 MED ORDER — MIDAZOLAM HCL 2 MG/2ML IJ SOLN
INTRAMUSCULAR | Status: AC
  Filled 2018-03-19: qty 2

## 2018-03-19 MED ORDER — MAGNESIUM CITRATE PO SOLN: 1.0000 | Freq: Once | ORAL | Status: DC | PRN

## 2018-03-19 MED ORDER — PROPOFOL 10 MG/ML IV BOLUS
INTRAVENOUS | Status: DC | PRN
  Administered 2018-03-19: 20 mg via INTRAVENOUS

## 2018-03-19 MED ORDER — PROPOFOL 10 MG/ML IV BOLUS
INTRAVENOUS | Status: AC
  Filled 2018-03-19: qty 20

## 2018-03-19 MED ORDER — VITAMIN D 25 MCG (1000 UNIT) PO TABS
1000.0000 [IU] | ORAL_TABLET | Freq: Every day | ORAL | Status: DC
  Administered 2018-03-20 – 2018-03-22 (×3): 1000 [IU] via ORAL
  Filled 2018-03-19 (×4): qty 1

## 2018-03-19 MED ORDER — ATORVASTATIN CALCIUM 20 MG PO TABS
40.0000 mg | ORAL_TABLET | Freq: Every day | ORAL | Status: DC
  Administered 2018-03-19 – 2018-03-21 (×3): 40 mg via ORAL
  Filled 2018-03-19 (×3): qty 2

## 2018-03-19 MED ORDER — HYDROCODONE-ACETAMINOPHEN 5-325 MG PO TABS
ORAL_TABLET | ORAL | Status: AC
  Filled 2018-03-19: qty 1

## 2018-03-19 MED ORDER — ALUM & MAG HYDROXIDE-SIMETH 200-200-20 MG/5ML PO SUSP: 30.0000 mL | ORAL | Status: DC | PRN

## 2018-03-19 MED ORDER — METOCLOPRAMIDE HCL 10 MG PO TABS: 5.0000 mg | ORAL_TABLET | Freq: Three times a day (TID) | ORAL | Status: DC | PRN

## 2018-03-19 MED ORDER — METHOCARBAMOL 500 MG PO TABS
500.0000 mg | ORAL_TABLET | Freq: Four times a day (QID) | ORAL | Status: DC | PRN
  Administered 2018-03-19: 500 mg via ORAL
  Filled 2018-03-19: qty 1

## 2018-03-19 MED ORDER — ZOLPIDEM TARTRATE 5 MG PO TABS: 5.0000 mg | ORAL_TABLET | Freq: Every evening | ORAL | Status: DC | PRN

## 2018-03-19 MED ORDER — PROPOFOL 500 MG/50ML IV EMUL
INTRAVENOUS | Status: DC | PRN
  Administered 2018-03-19: 30 ug/kg/min via INTRAVENOUS

## 2018-03-19 MED ORDER — FENTANYL CITRATE (PF) 100 MCG/2ML IJ SOLN
INTRAMUSCULAR | Status: DC | PRN
  Administered 2018-03-19: 50 ug via INTRAVENOUS

## 2018-03-19 MED ORDER — DULOXETINE HCL 60 MG PO CPEP
60.0000 mg | ORAL_CAPSULE | Freq: Every morning | ORAL | Status: DC
  Administered 2018-03-20 – 2018-03-22 (×3): 60 mg via ORAL
  Filled 2018-03-19 (×4): qty 1

## 2018-03-19 MED ORDER — LETROZOLE 2.5 MG PO TABS
2.5000 mg | ORAL_TABLET | Freq: Every day | ORAL | Status: DC
  Filled 2018-03-19 (×4): qty 1

## 2018-03-19 MED ORDER — FENTANYL CITRATE (PF) 100 MCG/2ML IJ SOLN
INTRAMUSCULAR | Status: AC
  Administered 2018-03-19: 25 ug via INTRAVENOUS
  Filled 2018-03-19: qty 2

## 2018-03-19 MED ORDER — SODIUM CHLORIDE 0.9 % IV SOLN
INTRAVENOUS | Status: DC | PRN
  Administered 2018-03-19: 08:00:00

## 2018-03-19 MED ORDER — VITAMIN B-12 1000 MCG PO TABS
500.0000 ug | ORAL_TABLET | Freq: Every day | ORAL | Status: DC
  Administered 2018-03-20 – 2018-03-22 (×3): 500 ug via ORAL
  Filled 2018-03-19 (×3): qty 1

## 2018-03-19 MED ORDER — CLINDAMYCIN PHOSPHATE 900 MG/50ML IV SOLN
INTRAVENOUS | Status: AC
  Filled 2018-03-19: qty 50

## 2018-03-19 MED ORDER — BUPIVACAINE HCL (PF) 0.25 % IJ SOLN
INTRAMUSCULAR | Status: AC
  Filled 2018-03-19: qty 30

## 2018-03-19 MED ORDER — GENTAMICIN SULFATE 40 MG/ML IJ SOLN
INTRAMUSCULAR | Status: AC
  Filled 2018-03-19: qty 2

## 2018-03-19 MED ORDER — BUPIVACAINE HCL (PF) 0.5 % IJ SOLN
INTRAMUSCULAR | Status: DC | PRN
  Administered 2018-03-19: 3 mL

## 2018-03-19 MED ORDER — METOCLOPRAMIDE HCL 5 MG/ML IJ SOLN: 5.0000 mg | Freq: Three times a day (TID) | INTRAMUSCULAR | Status: DC | PRN

## 2018-03-19 MED ORDER — TRAZODONE HCL 100 MG PO TABS
100.0000 mg | ORAL_TABLET | Freq: Every day | ORAL | Status: DC
  Administered 2018-03-19 – 2018-03-20 (×2): 100 mg via ORAL
  Filled 2018-03-19 (×3): qty 1

## 2018-03-19 MED ORDER — PANTOPRAZOLE SODIUM 40 MG PO TBEC
40.0000 mg | DELAYED_RELEASE_TABLET | Freq: Two times a day (BID) | ORAL | Status: DC
  Administered 2018-03-19 – 2018-03-22 (×6): 40 mg via ORAL
  Filled 2018-03-19 (×6): qty 1

## 2018-03-19 MED ORDER — MIDAZOLAM HCL 5 MG/5ML IJ SOLN
INTRAMUSCULAR | Status: DC | PRN
  Administered 2018-03-19: 1 mg via INTRAVENOUS

## 2018-03-19 MED ORDER — DIPHENHYDRAMINE HCL 12.5 MG/5ML PO ELIX: 12.5000 mg | ORAL_SOLUTION | ORAL | Status: DC | PRN

## 2018-03-19 MED ORDER — ONDANSETRON HCL 4 MG/2ML IJ SOLN
INTRAMUSCULAR | Status: AC
  Filled 2018-03-19: qty 2

## 2018-03-19 MED ORDER — PALBOCICLIB 100 MG PO CAPS: 100.0000 mg | ORAL_CAPSULE | Freq: Every day | ORAL | Status: DC

## 2018-03-19 MED ORDER — TRAMADOL HCL 50 MG PO TABS
50.0000 mg | ORAL_TABLET | Freq: Four times a day (QID) | ORAL | Status: DC
  Administered 2018-03-20 – 2018-03-21 (×7): 50 mg via ORAL
  Filled 2018-03-19 (×10): qty 1

## 2018-03-19 MED ORDER — SODIUM CHLORIDE 0.9 % IV SOLN
INTRAVENOUS | Status: DC
  Administered 2018-03-19: 13:00:00 via INTRAVENOUS

## 2018-03-19 MED ORDER — MAGNESIUM HYDROXIDE 400 MG/5ML PO SUSP
30.0000 mL | Freq: Every day | ORAL | Status: DC | PRN
  Administered 2018-03-20: 30 mL via ORAL
  Filled 2018-03-19: qty 30

## 2018-03-19 MED ORDER — ONDANSETRON HCL 4 MG PO TABS: 4.0000 mg | ORAL_TABLET | Freq: Four times a day (QID) | ORAL | Status: DC | PRN

## 2018-03-19 MED ORDER — ONDANSETRON HCL 4 MG/2ML IJ SOLN
4.0000 mg | Freq: Four times a day (QID) | INTRAMUSCULAR | Status: DC | PRN
  Administered 2018-03-19 – 2018-03-21 (×2): 4 mg via INTRAVENOUS
  Filled 2018-03-19 (×2): qty 2

## 2018-03-19 MED ORDER — ASPIRIN 81 MG PO CHEW
81.0000 mg | CHEWABLE_TABLET | Freq: Two times a day (BID) | ORAL | Status: DC
  Administered 2018-03-20 – 2018-03-21 (×3): 81 mg via ORAL
  Filled 2018-03-19 (×4): qty 1

## 2018-03-19 MED ORDER — FENTANYL CITRATE (PF) 100 MCG/2ML IJ SOLN
INTRAMUSCULAR | Status: AC
  Filled 2018-03-19: qty 2

## 2018-03-19 MED ORDER — EPINEPHRINE PF 1 MG/ML IJ SOLN
INTRAMUSCULAR | Status: AC
  Filled 2018-03-19: qty 1

## 2018-03-19 MED ORDER — DOCUSATE SODIUM 100 MG PO CAPS
100.0000 mg | ORAL_CAPSULE | Freq: Two times a day (BID) | ORAL | Status: DC
  Administered 2018-03-19 – 2018-03-22 (×6): 100 mg via ORAL
  Filled 2018-03-19 (×6): qty 1

## 2018-03-19 MED ORDER — FENTANYL CITRATE (PF) 100 MCG/2ML IJ SOLN
25.0000 ug | INTRAMUSCULAR | Status: AC | PRN
  Administered 2018-03-19 (×6): 25 ug via INTRAVENOUS

## 2018-03-19 MED ORDER — BISACODYL 5 MG PO TBEC: 5.0000 mg | DELAYED_RELEASE_TABLET | Freq: Every day | ORAL | Status: DC | PRN

## 2018-03-19 MED ORDER — BUPIVACAINE-EPINEPHRINE 0.25% -1:200000 IJ SOLN
INTRAMUSCULAR | Status: DC | PRN
  Administered 2018-03-19: 30 mL

## 2018-03-19 MED ORDER — MORPHINE SULFATE (PF) 4 MG/ML IV SOLN
0.5000 mg | INTRAVENOUS | Status: DC | PRN
  Administered 2018-03-19: 1 mg via INTRAVENOUS
  Filled 2018-03-19: qty 1

## 2018-03-19 MED ORDER — LEVOTHYROXINE SODIUM 88 MCG PO TABS
88.0000 ug | ORAL_TABLET | Freq: Every day | ORAL | Status: DC
  Administered 2018-03-20 – 2018-03-22 (×3): 88 ug via ORAL
  Filled 2018-03-19 (×3): qty 1

## 2018-03-19 MED ORDER — PHENOL 1.4 % MT LIQD: 1.0000 | OROMUCOSAL | Status: DC | PRN

## 2018-03-19 MED ORDER — HYDROCODONE-ACETAMINOPHEN 5-325 MG PO TABS
1.0000 | ORAL_TABLET | ORAL | Status: DC | PRN
  Administered 2018-03-19: 1 via ORAL
  Administered 2018-03-19 (×2): 2 via ORAL
  Administered 2018-03-19: 1 via ORAL
  Administered 2018-03-20 (×3): 2 via ORAL
  Filled 2018-03-19: qty 1
  Filled 2018-03-19 (×5): qty 2

## 2018-03-19 MED ORDER — DIAZEPAM 5 MG PO TABS: 5.0000 mg | ORAL_TABLET | Freq: Two times a day (BID) | ORAL | Status: DC | PRN

## 2018-03-19 MED ORDER — HYDROCODONE-ACETAMINOPHEN 7.5-325 MG PO TABS
1.0000 | ORAL_TABLET | ORAL | Status: DC | PRN
  Filled 2018-03-19: qty 2

## 2018-03-19 MED ORDER — LACTATED RINGERS IV SOLN
INTRAVENOUS | Status: DC
  Administered 2018-03-19: 07:00:00 via INTRAVENOUS

## 2018-03-19 MED ORDER — METHOCARBAMOL 1000 MG/10ML IJ SOLN
500.0000 mg | Freq: Four times a day (QID) | INTRAVENOUS | Status: DC | PRN
  Filled 2018-03-19: qty 5

## 2018-03-19 MED ORDER — MENTHOL 3 MG MT LOZG: 1.0000 | LOZENGE | OROMUCOSAL | Status: DC | PRN

## 2018-03-19 MED ORDER — ACETAMINOPHEN 500 MG PO TABS
500.0000 mg | ORAL_TABLET | Freq: Four times a day (QID) | ORAL | Status: AC
  Administered 2018-03-19: 500 mg via ORAL
  Filled 2018-03-19: qty 1

## 2018-03-19 MED ORDER — ONDANSETRON HCL 4 MG/2ML IJ SOLN
4.0000 mg | Freq: Once | INTRAMUSCULAR | Status: AC | PRN
  Administered 2018-03-19: 4 mg via INTRAVENOUS

## 2018-03-19 MED ORDER — ACETAMINOPHEN 325 MG PO TABS
325.0000 mg | ORAL_TABLET | Freq: Four times a day (QID) | ORAL | Status: DC | PRN
  Administered 2018-03-21 – 2018-03-22 (×2): 650 mg via ORAL
  Filled 2018-03-19 (×2): qty 2

## 2018-03-19 SURGICAL SUPPLY — 62 items
BLADE SAGITTAL AGGR TOOTH XLG (BLADE) ×3 IMPLANT
BNDG COHESIVE 6X5 TAN STRL LF (GAUZE/BANDAGES/DRESSINGS) ×9 IMPLANT
CANISTER SUCT 1200ML W/VALVE (MISCELLANEOUS) ×3 IMPLANT
CHLORAPREP W/TINT 26ML (MISCELLANEOUS) ×3 IMPLANT
COVER WAND RF STERILE (DRAPES) ×1 IMPLANT
DRAPE C-ARM XRAY 36X54 (DRAPES) ×3 IMPLANT
DRAPE INCISE IOBAN 66X60 STRL (DRAPES) IMPLANT
DRAPE POUCH INSTRU U-SHP 10X18 (DRAPES) ×3 IMPLANT
DRAPE SHEET LG 3/4 BI-LAMINATE (DRAPES) ×9 IMPLANT
DRAPE TABLE BACK 80X90 (DRAPES) ×3 IMPLANT
DRESSING SURGICEL FIBRLLR 1X2 (HEMOSTASIS) ×2 IMPLANT
DRSG OPSITE POSTOP 4X8 (GAUZE/BANDAGES/DRESSINGS) ×6 IMPLANT
DRSG SURGICEL FIBRILLAR 1X2 (HEMOSTASIS) ×6
ELECT BLADE 6.5 EXT (BLADE) ×3 IMPLANT
ELECT REM PT RETURN 9FT ADLT (ELECTROSURGICAL) ×3
ELECTRODE REM PT RTRN 9FT ADLT (ELECTROSURGICAL) ×1 IMPLANT
GLOVE BIOGEL PI IND STRL 7.5 (GLOVE) IMPLANT
GLOVE BIOGEL PI IND STRL 9 (GLOVE) ×1 IMPLANT
GLOVE BIOGEL PI INDICATOR 7.5 (GLOVE) ×12
GLOVE BIOGEL PI INDICATOR 9 (GLOVE) ×2
GLOVE SURG SYN 9.0  PF PI (GLOVE) ×4
GLOVE SURG SYN 9.0 PF PI (GLOVE) ×2 IMPLANT
GOWN SRG 2XL LVL 4 RGLN SLV (GOWNS) ×1 IMPLANT
GOWN STRL NON-REIN 2XL LVL4 (GOWNS) ×2
GOWN STRL REUS W/ TWL LRG LVL3 (GOWN DISPOSABLE) ×1 IMPLANT
GOWN STRL REUS W/TWL LRG LVL3 (GOWN DISPOSABLE) ×2
HEMOVAC 400CC 10FR (MISCELLANEOUS) IMPLANT
HIP DBL LINER 54X28 (Liner) ×2 IMPLANT
HIP FEM HD S 28 (Head) ×2 IMPLANT
HOLDER FOLEY CATH W/STRAP (MISCELLANEOUS) ×3 IMPLANT
HOOD PEEL AWAY FLYTE STAYCOOL (MISCELLANEOUS) ×3 IMPLANT
KIT PREVENA INCISION MGT 13 (CANNISTER) ×3 IMPLANT
MAT ABSORB  FLUID 56X50 GRAY (MISCELLANEOUS) ×2
MAT ABSORB FLUID 56X50 GRAY (MISCELLANEOUS) ×1 IMPLANT
NDL SAFETY ECLIPSE 18X1.5 (NEEDLE) ×1 IMPLANT
NDL SPNL 18GX3.5 QUINCKE PK (NEEDLE) ×1 IMPLANT
NEEDLE HYPO 18GX1.5 SHARP (NEEDLE) ×2
NEEDLE SPNL 18GX3.5 QUINCKE PK (NEEDLE) ×3 IMPLANT
NS IRRIG 1000ML POUR BTL (IV SOLUTION) ×3 IMPLANT
PACK HIP COMPR (MISCELLANEOUS) ×3 IMPLANT
PENCIL SMOKE ULTRAEVAC 22 CON (MISCELLANEOUS) ×3 IMPLANT
SCALPEL PROTECTED #10 DISP (BLADE) ×6 IMPLANT
SHELL ACETABULAR SZ 54 DM (Shell) ×2 IMPLANT
SOL PREP PVP 2OZ (MISCELLANEOUS) ×3
SOLUTION PREP PVP 2OZ (MISCELLANEOUS) ×1 IMPLANT
SPONGE DRAIN TRACH 4X4 STRL 2S (GAUZE/BANDAGES/DRESSINGS) ×3 IMPLANT
STAPLER SKIN PROX 35W (STAPLE) ×3 IMPLANT
STEM FEMORAL 4 STD COLLARED (Stem) ×2 IMPLANT
STRAP SAFETY 5IN WIDE (MISCELLANEOUS) ×3 IMPLANT
SUT DVC 2 QUILL PDO  T11 36X36 (SUTURE) ×2
SUT DVC 2 QUILL PDO T11 36X36 (SUTURE) ×1 IMPLANT
SUT SILK 0 (SUTURE) ×2
SUT SILK 0 30XBRD TIE 6 (SUTURE) ×1 IMPLANT
SUT V-LOC 90 ABS DVC 3-0 CL (SUTURE) ×3 IMPLANT
SUT VIC AB 1 CT1 36 (SUTURE) ×3 IMPLANT
SYR 20CC LL (SYRINGE) ×3 IMPLANT
SYR 30ML LL (SYRINGE) ×3 IMPLANT
SYR BULB IRRIG 60ML STRL (SYRINGE) ×3 IMPLANT
TAPE MICROFOAM 4IN (TAPE) ×1 IMPLANT
TOWEL OR 17X26 4PK STRL BLUE (TOWEL DISPOSABLE) ×3 IMPLANT
TRAY FOLEY MTR SLVR 16FR STAT (SET/KITS/TRAYS/PACK) ×3 IMPLANT
WND VAC CANISTER 500ML (MISCELLANEOUS) ×3 IMPLANT

## 2018-03-19 NOTE — H&P (Signed)
Reviewed paper H+P, will be scanned into chart. No changes noted.  

## 2018-03-19 NOTE — Progress Notes (Signed)
Pt admitted to rm 156. VSS. Pt in no acute distress. Oriented to unit. Safety measures in place.

## 2018-03-19 NOTE — Transfer of Care (Signed)
Immediate Anesthesia Transfer of Care Note  Patient: Breanna Zhang  Procedure(s) Performed: TOTAL HIP ARTHROPLASTY LEFT (Left Hip)  Patient Location: PACU  Anesthesia Type:Spinal  Level of Consciousness: awake, alert  and oriented  Airway & Oxygen Therapy: Patient Spontanous Breathing  Post-op Assessment: Report given to RN and Post -op Vital signs reviewed and stable  Post vital signs: Reviewed and stable  Last Vitals:  Vitals Value Taken Time  BP    Temp    Pulse    Resp    SpO2      Last Pain:  Vitals:   03/19/18 0627  TempSrc: Tympanic  PainSc: 5          Complications: No apparent anesthesia complications

## 2018-03-19 NOTE — NC FL2 (Signed)
[place] LEVEL OF CARE SCREENING TOOL     IDENTIFICATION  Patient Name: Breanna Zhang Birthdate: 11/15/1940 Sex: female Admission Date (Current Location): 03/19/2018  [place] and [place] Number:  Engineering geologist and Address:  [facility], [address] [place], [place] [address]      Provider Number: [id number]  Attending Physician Name and Address:  Hessie Knows, MD  Relative Name and Phone Number:       Current Level of Care: Hospital Recommended Level of Care: Central Prior Approval Number:    Date Approved/Denied:   PASRR Number:    Discharge Plan: SNF    Current Diagnoses: Patient Active Problem List   Diagnosis Date Noted  . Status post total hip replacement, left 03/19/2018  . Genetic testing 02/12/2018  . Family history of colon cancer   . Family history of lung cancer   . Family history of throat cancer   . Leukopenia due to antineoplastic chemotherapy ([place]) 12/04/2017  . Chest pain at rest 11/14/2017  . Hyperlipidemia 11/14/2017  . Shortness of breath 11/14/2017  . Metastatic breast cancer ([place]) 09/17/2017  . Lung cancer ([place]) 09/10/2017  . Headache disorder 02/26/2017  . Arthralgia of both hands 05/24/2016  . Personal history of malignant neoplasm of breast 09/08/2015  . Adult hypothyroidism 04/06/2015  . Blood glucose elevated 09/07/2014  . Deafness, sensorineural 07/20/2014  . Clinical depression 06/22/2014  . Acid reflux 06/22/2014  . BP (high blood pressure) 06/22/2014  . Malignant neoplasm of breast ([place]) 06/22/2014  . Frequent UTI 06/22/2014  . Apnea, sleep 06/22/2014  . Head revolving around 06/22/2014  . Dizziness 04/16/2014  . Degeneration of intervertebral disc of lumbar region 10/06/2013  . Neuritis or radiculitis due to rupture of lumbar intervertebral disc 10/06/2013  . Arthritis of knee, degenerative 10/06/2013  . Lumbar radiculitis 10/06/2013  . Cystocele, midline  11/16/2011  . Female genuine stress incontinence 11/16/2011  . Incomplete bladder emptying 11/16/2011  . 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 Excessive urination at night 11/16/2011  . Neuralgia neuritis, sciatic nerve 11/16/2011  . Urge incontinence of urine 11/16/2011    Orientation RESPIRATION BLADDER Height & Weight     Self, Time, Place  Normal Continent Weight:   Height:     BEHAVIORAL SYMPTOMS/MOOD NEUROLOGICAL BOWEL NUTRITION STATUS  (none) (None) Continent Diet(Regular diet )  AMBULATORY STATUS COMMUNICATION OF NEEDS Skin   Extensive Assist Verbally Surgical wounds(Left Hip incision )                       Personal Care Assistance Level of Assistance  Bathing, Feeding, Dressing Bathing Assistance: Limited assistance Feeding assistance: Independent Dressing Assistance: Limited assistance     Functional Limitations Info  Sight, Hearing, Speech Sight Info: Adequate Hearing Info: Adequate Speech Info: Adequate    SPECIAL CARE FACTORS FREQUENCY  PT (By licensed PT), OT (By licensed OT)     PT Frequency: 5 OT Frequency: 5            Contractures Contractures Info: Not present    Additional Factors Info  Code Status, Allergies Code Status Info: Full Code  Allergies Info:  Imipramine, Latex, Penicillins, Tape           Current Medications (03/19/2018):  This is the current hospital active medication list Current Facility-Administered Medications  Medication Dose Route Frequency Provider Last Rate Last Dose  . 0.9 %  sodium chloride infusion   Intravenous Continuous  Hessie Knows, MD 75 mL/hr at 03/19/18 1255    . [START ON 03/20/2018] acetaminophen (TYLENOL) tablet 325-650 mg  325-650 mg Oral Q6H PRN Hessie Knows, MD      . acetaminophen (TYLENOL) tablet 500 mg  500 mg Oral Q6H Hessie Knows, MD      . alum & mag hydroxide-simeth (MAALOX/MYLANTA) 200-200-20 MG/5ML suspension 30 mL  30 mL Oral Q4H PRN Hessie Knows, MD      . aspirin chewable  tablet 81 mg  81 mg Oral BID Hessie Knows, MD      . atorvastatin (LIPITOR) tablet 40 mg  40 mg Oral QHS Hessie Knows, MD      . bisacodyl (DULCOLAX) EC tablet 5 mg  5 mg Oral Daily PRN Hessie Knows, MD      . cholecalciferol (VITAMIN D3) tablet 1,000 Units  1,000 Units Oral Daily Hessie Knows, MD      . diazepam (VALIUM) tablet 5 mg  5 mg Oral Q12H PRN Hessie Knows, MD      . diphenhydrAMINE (BENADRYL) 12.5 MG/5ML elixir 12.5-25 mg  12.5-25 mg Oral Q4H PRN Hessie Knows, MD      . docusate sodium (COLACE) capsule 100 mg  100 mg Oral BID Hessie Knows, MD      . DULoxetine (CYMBALTA) DR capsule 60 mg  60 mg Oral q morning - 10a Hessie Knows, MD      . HYDROcodone-acetaminophen [facility]) 7.5-325 MG per tablet 1-2 tablet  1-2 tablet Oral Q4H PRN Hessie Knows, MD      . HYDROcodone-acetaminophen (NORCO/VICODIN) 5-325 MG per tablet 1-2 tablet  1-2 tablet Oral Q4H PRN Hessie Knows, MD   1 tablet at 03/19/18 1328  . letrozole [facility]) tablet 2.5 mg  2.5 mg Oral Daily Hessie Knows, MD      . Derrill Memo ON 03/20/2018] levothyroxine (SYNTHROID, LEVOTHROID) tablet 88 mcg  88 mcg Oral QAC breakfast Hessie Knows, MD      . magnesium citrate solution 1 Bottle  1 Bottle Oral Once PRN Hessie Knows, MD      . magnesium hydroxide (MILK OF MAGNESIA) suspension 30 mL  30 mL Oral Daily PRN Hessie Knows, MD      . menthol-cetylpyridinium (CEPACOL) lozenge 3 mg  1 lozenge Oral PRN Hessie Knows, MD       Or  . phenol (CHLORASEPTIC) mouth spray 1 spray  1 spray Mouth/Throat PRN Hessie Knows, MD      . methocarbamol (ROBAXIN) tablet 500 mg  500 mg Oral Q6H PRN Hessie Knows, MD   500 mg at 03/19/18 1328   Or  . methocarbamol (ROBAXIN) 500 mg in dextrose 5 % 50 mL IVPB  500 mg Intravenous Q6H PRN Hessie Knows, MD      . metoCLOPramide (REGLAN) tablet 5-10 mg  5-10 mg Oral Q8H PRN Hessie Knows, MD       Or  . metoCLOPramide (REGLAN) injection 5-10 mg  5-10 mg Intravenous Q8H PRN Hessie Knows, MD      . morphine  4 MG/ML injection 0.52-1 mg  0.52-1 mg Intravenous Q2H PRN Hessie Knows, MD   1 mg at 03/19/18 1532  . ondansetron (ZOFRAN) tablet 4 mg  4 mg Oral Q6H PRN Hessie Knows, MD       Or  . ondansetron [facility]) injection 4 mg  4 mg Intravenous Q6H PRN Hessie Knows, MD   4 mg at 03/19/18 1532  . [START ON 03/20/2018] palbociclib (IBRANCE) capsule 100 mg  100 mg Oral Q  breakfast Hessie Knows, MD      . pantoprazole (PROTONIX) EC tablet 40 mg  40 mg Oral BID Hessie Knows, MD      . traMADol [facility]) tablet 50 mg  50 mg Oral Q6H Hessie Knows, MD      . traZODone (DESYREL) tablet 100 mg  100 mg Oral QHS Hessie Knows, MD      . vitamin B-12 (CYANOCOBALAMIN) tablet 500 mcg  500 mcg Oral Daily Hessie Knows, MD      . zolpidem (AMBIEN) tablet 5 mg  5 mg Oral QHS PRN Hessie Knows, MD         Discharge Medications: Please see discharge summary for a list of discharge medications.  Relevant Imaging Results:  Relevant Lab Results:   Additional Information SSN: [id number]  Annamaria Boots, [place]

## 2018-03-19 NOTE — Anesthesia Procedure Notes (Signed)
Spinal  Patient location during procedure: OR Start time: 03/19/2018 7:26 AM End time: 03/19/2018 7:42 AM Staffing Anesthesiologist: Martha Clan, MD Resident/CRNA: Meghann [place], Einar Grad, CRNA Performed: anesthesiologist and resident/CRNA  Preanesthetic Checklist Completed: patient identified, site marked, surgical consent, pre-op evaluation, timeout performed, IV checked, risks and benefits discussed and monitors and equipment checked Spinal Block Patient position: sitting Prep: ChloraPrep Patient monitoring: heart rate, continuous pulse ox and blood pressure Approach: midline Location: L2-3 Injection technique: single-shot Needle Needle type: [place] and Introducer  Needle gauge: 24 G Needle length: 10 cm Additional Notes Patient identified and IV placement confirmed prior to spinal placement.  No paresthesias on injection of medication.  Patient tolerated the procedure well without complications.

## 2018-03-19 NOTE — Anesthesia Post-op Follow-up Note (Signed)
Anesthesia QCDR form completed.        

## 2018-03-19 NOTE — Op Note (Signed)
03/19/2018  8:58 AM  PATIENT:  Breanna Zhang  78 y.o. female  PRE-OPERATIVE DIAGNOSIS:  PRIMARY OSTEOARTHRITIS OF LEFT HIP  POST-OPERATIVE DIAGNOSIS:  PRIMARY OSTEOARTHRITIS OF LEFT HIP  PROCEDURE:  Procedure(s): TOTAL HIP ARTHROPLASTY LEFT (Left)  SURGEON: Laurene Footman, MD  ASSISTANTS: none  ANESTHESIA:   spinal  EBL:  Total I/O In: 300 [I.V.:300] Out: 50 [Blood:50]  BLOOD ADMINISTERED:none  DRAINS: none   LOCAL MEDICATIONS USED:  MARCAINE     SPECIMEN:  Source of Specimen:  Left femoral head  DISPOSITION OF SPECIMEN:  PATHOLOGY  COUNTS:  YES  TOURNIQUET:  * No tourniquets in log *  IMPLANTS: Medacta AMIS 4 standard stem with S metal 28 mm head, 54 mm Mpact DM cup and liner  DICTATION: .Dragon Dictation   The patient was brought to the operating room and after spinal anesthesia was obtained patient was placed on the operative table with the ipsilateral foot into the Medacta attachment, contralateral leg on a well-padded table. C-arm was brought in and preop template x-ray taken. After prepping and draping in usual sterile fashion appropriate patient identification and timeout procedures were completed. Anterior approach to the hip was obtained and centered over the greater trochanter and TFL muscle. The subcutaneous tissue was incised hemostasis being achieved by electrocautery. TFL fascia was incised and the muscle retracted laterally deep retractor placed. The lateral femoral circumflex vessels were identified and ligated. The anterior capsule was exposed and a capsulotomy performed. The neck was identified and a femoral neck cut carried out with a saw. The head was removed without difficulty and showed sclerotic femoral head and acetabulum. Reaming was carried out to 54 mm and a 54 mm cup trial gave appropriate tightness to the acetabular component a 54 DM cup was impacted into position. The leg was then externally rotated and ischiofemoral and pubofemoral releases carried  out. The femur was sequentially broached to a size 4, size 4 standard with as had trials were placed and the final components chosen. The 4 standard stem was inserted along with a metal S 28 mm head and 54 mm liner. The hip was reduced and was stable the wound was thoroughly irrigated with fibrillar placed along the posterior capsule and medial neck. The deep fascia ws closed using a heavy [place] after infiltration of 30 cc of quarter percent Sensorcaine with epinephrine.3-0 V-loc to close the skin with skin staples.  Incisional wound VAC applied and patient was sent to recovery in stable condition.   PLAN OF CARE: Admit to inpatient

## 2018-03-19 NOTE — Evaluation (Signed)
Physical Therapy Evaluation Patient Details Name: Breanna Zhang MRN: [id number] DOB: 12/22/1940 Today's Date: 03/19/2018   History of Present Illness  Pt is a 78 y/o F s/p L THA.  Pt's PMH includes vertigo, cochlear implant, osteoporosis, breast cancer, a-fib.    Clinical Impression  Pt is s/p THA resulting in the deficits listed below (see PT Problem List). Ms. Douse required min assist for bed mobility and min +2 assist for stand pivot transfer.  Cues provided for safe technique with transfers.  Pt lives with husband but he is gone during the day at work.  Given pt's current mobility status, recommending SNF at d/c.   Pt will benefit from skilled PT to increase their independence and safety with mobility to allow discharge to the venue listed below.     Follow Up Recommendations SNF    Equipment Recommendations  Rolling walker with 5" wheels    Recommendations for Other Services OT consult     Precautions / Restrictions Precautions Precautions: Fall Precaution Comments: direct anterior, no hip precautions Restrictions Weight Bearing Restrictions: Yes LLE Weight Bearing: Weight bearing as tolerated      Mobility  Bed Mobility Overal bed mobility: Needs Assistance Bed Mobility: Supine to Sit     Supine to sit: Min assist;HOB elevated     General bed mobility comments: Increased effort and time with cues for sequencing.  Assist to advance LLE to EOB.  Heavy use of bed rail.  Cues to allow knees to bend once off side of bed as pt maintains Bil knee E due to pain.   Transfers Overall transfer level: Needs assistance Equipment used: Rolling walker (2 wheeled) Transfers: Sit to/from [facility] Sit to Stand: Min assist Stand pivot transfers: Min assist;+2 physical assistance       General transfer comment: Cues for proper hand placement and assist to boost to standing as well as to control descent to sit. Assist to remain steady when pivoting.    Ambulation/Gait             General Gait Details: Deferred due to fatigue  Stairs            Wheelchair Mobility    Modified Rankin (Stroke Patients Only)       Balance Overall balance assessment: Needs assistance Sitting-balance support: No upper extremity supported;Feet supported Sitting balance-Leahy Scale: Good     Standing balance support: Single extremity supported;During functional activity Standing balance-Leahy Scale: Poor Standing balance comment: Pt relies on at least 1UE support for static and dynamic activities                             Pertinent Vitals/Pain Pain Assessment: 0-10 Pain Score: 5  Pain Location: L hip Pain Descriptors / Indicators: Aching;Grimacing;Guarding;Moaning Pain Intervention(s): Limited activity within patient's tolerance;Monitored during session;Premedicated before session;Repositioned;Ice applied;Utilized relaxation techniques    Home Living Family/patient expects to be discharged to:: Private residence Living Arrangements: Spouse/significant other Available Help at Discharge: Family;Available PRN/intermittently(husband works during the day) Type of Home: Mobile home Home Access: [place]: One level Home Equipment: Environmental consultant - 4 wheels;Cane - single point;Shower seat;Grab bars - tub/shower      Prior Function Level of Independence: Independent with assistive device(s)         Comments: Ambulates with rollator in community and sometimes in the home, otherwise uses SPC in the home.  No falls in the past 6  months.  Husband and pt both do the cooking.  Pt does the cleaning.  Ind with bathing, dressing.      Hand Dominance        Extremity/Trunk Assessment   Upper Extremity Assessment Upper Extremity Assessment: (BUE strength grossly 4/5)    Lower Extremity Assessment Lower Extremity Assessment: LLE deficits/detail LLE Deficits / Details: Unable to formally assess strength  LLE s/p L THA.  Strength functionally at least 3/5 LLE Sensation: WNL       Communication   Communication: HOH;Other (comment)(has cochlear implant)  Cognition Arousal/Alertness: Awake/alert Behavior During Therapy: WFL for tasks assessed/performed Overall Cognitive Status: Within Functional Limits for tasks assessed                                        General Comments General comments (skin integrity, edema, etc.): BP taken in supine and sitting, WNL.     Exercises Total Joint Exercises Ankle Circles/Pumps: AROM;Both;10 reps;Supine Quad Sets: Strengthening;Both;10 reps;Supine   Assessment/Plan    PT Assessment Patient needs continued PT services  PT Problem List Decreased strength;Decreased range of motion;Decreased activity tolerance;Decreased balance;Decreased mobility;Decreased knowledge of use of DME;Decreased safety awareness;Pain       PT Treatment Interventions DME instruction;Gait training;Functional mobility training;Therapeutic activities;Therapeutic exercise;Balance training;Neuromuscular re-education;Patient/family education;Modalities    PT Goals (Current goals can be found in the Care Plan section)  Acute Rehab PT Goals Patient Stated Goal: to be able to walk PT Goal Formulation: With patient Time For Goal Achievement: 04/02/18 Potential to Achieve Goals: Good    Frequency BID   Barriers to discharge Decreased caregiver support Husband works during the day    Co-evaluation               AM-PAC PT "6 Clicks" Mobility  Outcome Measure Help needed turning from your back to your side while in a flat bed without using bedrails?: A Lot Help needed moving from lying on your back to sitting on the side of a flat bed without using bedrails?: A Lot Help needed moving to and from a bed to a chair (including a wheelchair)?: A Lot Help needed standing up from a chair using your arms (e.g., wheelchair or bedside chair)?: A Little Help  needed to walk in hospital room?: A Little Help needed climbing 3-5 steps with a railing? : A Lot 6 Click Score: 14    End of Session Equipment Utilized During Treatment: Gait belt Activity Tolerance: Patient tolerated treatment well;Patient limited by fatigue Patient left: in chair;with call bell/phone within reach;with chair alarm set;with SCD's reapplied Nurse Communication: Mobility status PT Visit Diagnosis: Pain;Unsteadiness on feet (R26.81);Other abnormalities of gait and mobility (R26.89);Muscle weakness (generalized) (M62.81) Pain - Right/Left: Left Pain - part of body: Hip    Time: 1275-1700 PT Time Calculation (min) (ACUTE ONLY): 28 min   Charges:   PT Evaluation $PT Eval Low Complexity: 1 Low PT Treatments $Therapeutic Exercise: 8-22 mins        Collie Siad PT, DPT 03/19/2018, 3:56 PM

## 2018-03-19 NOTE — Anesthesia Preprocedure Evaluation (Addendum)
Anesthesia Evaluation  Patient identified by MRN, date of birth, ID band Patient awake    Reviewed: Allergy & Precautions, NPO status , Patient's Chart, lab work & pertinent test results, reviewed documented beta blocker date and time   History of Anesthesia Complications Negative for: history of anesthetic complications  Airway Mallampati: II  TM Distance: >3 FB     Dental  (+) Upper Dentures, Lower Dentures   Pulmonary shortness of breath, sleep apnea , neg COPD, neg recent URI,           Cardiovascular hypertension, Pt. on medications (-) angina+ Peripheral Vascular Disease  (-) CAD, (-) Past MI, (-) Cardiac Stents and (-) CABG + dysrhythmias Atrial Fibrillation (-) Valvular Problems/Murmurs     Neuro/Psych  Headaches, neg Seizures PSYCHIATRIC DISORDERS Anxiety Depression  Neuromuscular disease    GI/Hepatic Neg liver ROS, GERD  Controlled,  Endo/Other  neg diabetesHypothyroidism   Renal/GU negative Renal ROS     Musculoskeletal  (+) Arthritis ,   Abdominal   Peds  Hematology   Anesthesia Other Findings Past Medical History: No date: Anxiety No date: Atrial fibrillation ([place]) 1999: Breast cancer ([place])     Comment:  RT MASTECTOMY No date: DDD (degenerative disc disease), cervical No date: Depression No date: Diverticulosis No date: Diverticulosis No date: DVT of lower extremity (deep venous thrombosis) ([facility]) No date: Dyspnea No date: Family history of colon cancer No date: Family history of lung cancer No date: Family history of throat cancer No date: GERD (gastroesophageal reflux disease) No date: Goiter No date: Headache 2000: History of chemotherapy     Comment:  BREAST CA No date: Hypertension No date: Incontinence No date: OSA (obstructive sleep apnea) No date: Osteoporosis 1999: Personal history of chemotherapy     Comment:  BREAST CA No date: Status post chemotherapy     Comment:  2000 right  breast cancer No date: Thyroid disease No date: UTI (urinary tract infection) No date: Vertigo   Reproductive/Obstetrics                            Anesthesia Physical  Anesthesia Plan  ASA: III  Anesthesia Plan: Spinal   Post-op Pain Management:    Induction:   PONV Risk Score and Plan: 2 and Propofol infusion and TIVA  Airway Management Planned: Natural Airway and Simple Face Mask  Additional Equipment:   Intra-op Plan:   Post-operative Plan:   Informed Consent: I have reviewed the patients History and Physical, chart, labs and discussed the procedure including the risks, benefits and alternatives for the proposed anesthesia with the patient or authorized representative who has indicated his/her understanding and acceptance.     Plan Discussed with: CRNA  Anesthesia Plan Comments:        Anesthesia Quick Evaluation

## 2018-03-20 ENCOUNTER — Encounter
Admission: RE | Admit: 2018-03-20 | Discharge: 2018-03-20 | Disposition: A | Discharge status: Home / Self Care | Payer: PPO | Source: Ambulatory Visit | Attending: Internal Medicine | Admitting: Internal Medicine

## 2018-03-20 ENCOUNTER — Telehealth: Payer: Self-pay | Admitting: Pharmacist

## 2018-03-20 LAB — CBC
HCT: 32.6 % — ABNORMAL LOW (ref 36.0–46.0)
Hemoglobin: 10.7 g/dL — ABNORMAL LOW (ref 12.0–15.0)
MCH: 32.8 pg (ref 26.0–34.0)
MCHC: 32.8 g/dL (ref 30.0–36.0)
MCV: 100 fL (ref 80.0–100.0)
PLATELETS: 295 10*3/uL (ref 150–400)
RBC: 3.26 MIL/uL — ABNORMAL LOW (ref 3.87–5.11)
RDW: 12.4 % (ref 11.5–15.5)
WBC: 8 10*3/uL (ref 4.0–10.5)
nRBC: 0 % (ref 0.0–0.2)

## 2018-03-20 LAB — BASIC METABOLIC PANEL
Anion gap: 6 (ref 5–15)
BUN: 12 mg/dL (ref 8–23)
CO2: 25 mmol/L (ref 22–32)
Calcium: 7.8 mg/dL — ABNORMAL LOW (ref 8.9–10.3)
Chloride: 104 mmol/L (ref 98–111)
Creatinine, Ser: 0.79 mg/dL (ref 0.44–1.00)
GFR calc Af Amer: >60 mL/min (ref >60)
Glucose, Bld: 131 mg/dL — ABNORMAL HIGH (ref 70–99)
Potassium: 3.6 mmol/L (ref 3.5–5.1)
Sodium: 135 mmol/L (ref 135–145)

## 2018-03-20 LAB — SURGICAL PATHOLOGY

## 2018-03-20 NOTE — Evaluation (Signed)
Occupational Therapy Evaluation Patient Details Name: Breanna Zhang MRN: [id number] DOB: 26-Aug-1940 Today's Date: 03/20/2018    History of Present Illness Pt is a 77 y/o F s/p L THA.  Pt's PMH includes vertigo, cochlear implant, osteoporosis, breast cancer, a-fib.     Clinical Impression   Pt seen for OT evaluation this date, POD#1 from above surgery. Pt was independent in all ADLs prior to surgery, however using a 4ww while ambulating in the community and [facility] for mobility in the home. Pt is eager to return to PLOF with less pain and improved safety and independence. Pt currently requires mod assist +2 (safety and equipment) for mobility, LB dressing, and bathing while in seated position due to pain and limited AROM of L hip. Pt instructed in self care skills, falls prevention strategies, home/routines modifications, DME/AE for LB bathing and dressing tasks. Pt requiring consistent verbal cues for safety and sequencing during transfer from bed to chair. Pt and daughter report that pt has had 1 fall (no injury) in her home in the last 12 months. Pt endorses that she consistently wakes up at night to address urinary incontinence. Pt would benefit from additional instruction in self care skills and techniques with or without assistive devices to to help maintain safety, reduce risk of falls, and support recall and carryover of education prior to discharge. Recommend SNF upon discharge.      Follow Up Recommendations  SNF    Equipment Recommendations  Other (comment)(TBD)    Recommendations for Other Services       Precautions / Restrictions Precautions Precautions: Fall Precaution Comments: direct anterior, no hip precautions Restrictions Weight Bearing Restrictions: Yes LLE Weight Bearing: Weight bearing as tolerated      Mobility Bed Mobility Overal bed mobility: Needs Assistance Bed Mobility: Supine to Sit     Supine to sit: HOB elevated;Mod assist     General bed mobility  comments: Pt needed VC for bed mobility. Very unsure about moving leg, and using UE to support herslef in bed.   Transfers Overall transfer level: Needs assistance Equipment used: Rolling walker (2 wheeled);2 person hand held assist Transfers: Sit to/from Stand Sit to Stand: Min assist;+2 safety/equipment Stand pivot transfers: Min assist;+2 physical assistance       General transfer comment: Assist to scoot to edge of seat prior to standing.  Cues for proper hand placement and Bil foot placement for improved mechanical advantage and safety.  Assist to boost to standing. VC for ambulation to chair and for hand placment when moving from standing to sitting.     Balance Overall balance assessment: Mild deficits observed, not formally tested;Needs assistance Sitting-balance support: No upper extremity supported;Feet supported Sitting balance-Leahy Scale: Good     Standing balance support: Bilateral upper extremity supported Standing balance-Leahy Scale: Poor Standing balance comment: Pt required BUE supported on RW when standing. Unable to reach outside BOS.                            ADL either performed or assessed with clinical judgement   ADL Overall ADL's : Needs assistance/impaired Eating/Feeding: Independent   Grooming: Set up;Bed level;Cueing for safety   Upper Body Bathing: Set up;Bed level   Lower Body Bathing: Moderate assistance;+2 for safety/equipment;Cueing for safety   Upper Body Dressing : Set up;Cueing for safety   Lower Body Dressing: Moderate assistance;+2 for safety/equipment;Sit to/from stand   Toilet Transfer: Moderate assistance;+2 for safety/equipment;Stand-pivot;BSC;Cueing for safety;Cueing for  sequencing   Toileting- Clothing Manipulation and Hygiene: +2 for safety/equipment;Moderate assistance;Cueing for safety;Cueing for sequencing   Tub/ Shower Transfer: Moderate assistance;+2 for safety/equipment;Cueing for safety;Rolling walker;Grab  bars;Tub bench;Cueing for sequencing   Functional mobility during ADLs: Moderate assistance;Minimal assistance;Cueing for safety;Rolling walker;Cueing for sequencing General ADL Comments: Pt requiring consistent verbal cueing for safety throughout ADL and mobility tasks. At time requiring cuing for sequencing and to move feet while ambulating with RW. Pt endorsing feeling "unsure" about putting weight through LLE. Required reminders about WBAT status.       Vision Baseline Vision/History: Macular Degeneration;Cataracts(H/O cataract surgery. Recieves injections for Mac D. Does not wear glasses. ) Patient Visual Report: No change from baseline       Perception     Praxis      Pertinent Vitals/Pain Pain Assessment: 0-10 Pain Score: 5  Faces Pain Scale: Hurts whole lot Pain Location: L hip Pain Descriptors / Indicators: Aching;Grimacing;Guarding Pain Intervention(s): Limited activity within patient's tolerance;Monitored during session;Repositioned     Hand Dominance     Extremity/Trunk Assessment Upper Extremity Assessment Upper Extremity Assessment: Overall WFL for tasks assessed   Lower Extremity Assessment Lower Extremity Assessment: Overall WFL for tasks assessed;Defer to PT evaluation LLE Deficits / Details: Unable to formally assess strength LLE s/p L THA.  Strength functionally at least 3/5 LLE Sensation: WNL   Cervical / Trunk Assessment Cervical / Trunk Assessment: Normal   Communication Communication Communication: HOH;Other (comment)(Bilat cochlear implants typically only wears L-side.)   Cognition Arousal/Alertness: Awake/alert Behavior During Therapy: WFL for tasks assessed/performed Overall Cognitive Status: Within Functional Limits for tasks assessed                                     General Comments       Exercises  Other Exercises: Pt instructed in safe use of AE/DME for LB dressing and ADL/IADL participation for safety and falls  prevention.    Shoulder Instructions      Home Living Family/patient expects to be discharged to:: Private residence Living Arrangements: Spouse/significant other Available Help at Discharge: Family;Available PRN/intermittently Type of Home: Mobile home Home Access: Ramped entrance     Home Layout: One level     Bathroom Shower/Tub: Teacher, early years/pre: Standard     Home Equipment: Environmental consultant - 4 wheels;Cane - single point;Shower seat;Grab bars - tub/shower          Prior Functioning/Environment Level of Independence: Independent with assistive device(s)        Comments: Ambulates with rollator in community and sometimes in the home, otherwise uses SPC in the home.  No falls in the past 6 months.  Husband and pt both do the cooking.  Pt does the cleaning.  Ind with bathing, dressing.         OT Problem List: Decreased strength;Decreased range of motion;Decreased activity tolerance;Decreased safety awareness;Decreased knowledge of use of DME or AE;Pain      OT Treatment/Interventions: Self-care/ADL training;Therapeutic exercise;Therapeutic activities;DME and/or AE instruction;Energy conservation;Patient/family education    OT Goals(Current goals can be found in the care plan section) Acute Rehab OT Goals Patient Stated Goal: to be able to walk OT Goal Formulation: With patient/family Time For Goal Achievement: 04/03/18 Potential to Achieve Goals: Good ADL Goals Pt Will Perform Lower Body Bathing: sit to/from stand;with min assist;with adaptive equipment(With LRAD for safety and precaution adherence.) Pt Will Perform Lower Body Dressing: with  adaptive equipment;sit to/from stand;with min guard assist(With LRAD for safety and precaution adherence) Pt Will Transfer to Toilet: with min assist;ambulating;bedside commode(With LRAD/DME for safety and adherence to precautions.) Additional ADL Goal #1: Pt will independently demonstrate 3/3 hip precautions while  completing ADL and IADL tasks.  OT Frequency: Min 1X/week   Barriers to D/C: Decreased caregiver support  Caregiver not available 24/7.       Co-evaluation              AM-PAC OT "6 Clicks" Daily Activity     Outcome Measure Help from another person eating meals?: None Help from another person taking care of personal grooming?: A Little Help from another person toileting, which includes using toliet, bedpan, or urinal?: A Lot Help from another person bathing (including washing, rinsing, drying)?: A Lot Help from another person to put on and taking off regular upper body clothing?: A Little Help from another person to put on and taking off regular lower body clothing?: A Lot 6 Click Score: 16   End of Session Equipment Utilized During Treatment: Gait belt;Rolling walker  Activity Tolerance: Patient tolerated treatment well;No increased pain;Patient limited by pain Patient left: in chair;with call bell/phone within reach;with chair alarm set;with family/visitor present;with SCD's reapplied  OT Visit Diagnosis: Unsteadiness on feet (R26.81);Other abnormalities of gait and mobility (R26.89);History of falling (Z91.81);Pain Pain - Right/Left: Left Pain - part of body: Hip                Time: 1660-6301 OT Time Calculation (min): 52 min Charges:  OT General Charges $OT Visit: 1 Visit OT Evaluation $OT Eval Low Complexity: 1 Low OT Treatments $Self Care/Home Management : 23-37 mins  Renard Caperton, OTR/L 03/20/18, 11:49 AM

## 2018-03-20 NOTE — Progress Notes (Signed)
Physical Therapy Treatment Patient Details Name: Breanna Zhang MRN: [id number] DOB: 1940/11/04 Today's Date: 03/20/2018    History of Present Illness Pt is a 78 y/o F s/p L THA.  Pt's PMH includes vertigo, cochlear implant, osteoporosis, breast cancer, a-fib.      PT Comments    Breanna Zhang made good progress toward therapy goals this session.  Pt ambulated 70 ft with cues for proper sequencing and posture.  She is limited by fatigue and continues to require assist with transfers and bed mobility.  Follow up recommendations remain appropriate.   Follow Up Recommendations  SNF     Equipment Recommendations  Rolling walker with 5" wheels    Recommendations for Other Services       Precautions / Restrictions Precautions Precautions: Fall Precaution Comments: direct anterior, no hip precautions Restrictions Weight Bearing Restrictions: Yes LLE Weight Bearing: Weight bearing as tolerated    Mobility  Bed Mobility Overal bed mobility: Needs Assistance Bed Mobility: Sit to Supine       Sit to supine: Min assist   General bed mobility comments: Assist to bring LLE into bed  Transfers Overall transfer level: Needs assistance Equipment used: Rolling walker (2 wheeled) Transfers: Sit to/from Stand Sit to Stand: Min assist         General transfer comment: Pt able to scoot to edge of seat without assist but with increased time and cues.  Cues for hand placement.    Ambulation/Gait Ambulation/Gait assistance: Min assist;+2 safety/equipment Gait Distance (Feet): 70 Feet Assistive device: Rolling walker (2 wheeled) Gait Pattern/deviations: Step-to pattern;Decreased step length - right;Decreased weight shift to left;Decreased stance time - left;Antalgic;Trunk flexed Gait velocity: decreased Gait velocity interpretation: <1.31 ft/sec, indicative of household ambulator General Gait Details: Verbal cues for sequencing using RW and for forward gaze.  Very dec gait speed but pt  remains steady.    Stairs             Wheelchair Mobility    Modified Rankin (Stroke Patients Only)       Balance Overall balance assessment: Needs assistance Sitting-balance support: No upper extremity supported;Feet supported Sitting balance-Leahy Scale: Good     Standing balance support: Single extremity supported;During functional activity Standing balance-Leahy Scale: Poor Standing balance comment: Pt relies on BUE support for static and dynamic activities                            Cognition Arousal/Alertness: Awake/alert Behavior During Therapy: WFL for tasks assessed/performed Overall Cognitive Status: Within Functional Limits for tasks assessed                                        Exercises      General Comments        Pertinent Vitals/Pain Pain Assessment: Faces Faces Pain Scale: Hurts little more Pain Location: L hip Pain Descriptors / Indicators: Aching;Grimacing;Guarding Pain Intervention(s): Limited activity within patient's tolerance;Monitored during session;Repositioned;Premedicated before session;Utilized relaxation techniques;Ice applied    Home Living                      Prior Function            PT Goals (current goals can now be found in the care plan section) Acute Rehab PT Goals Patient Stated Goal: to be able to walk PT Goal Formulation: With  patient Time For Goal Achievement: 04/02/18 Potential to Achieve Goals: Good Progress towards PT goals: Progressing toward goals    Frequency    BID      PT Plan Current plan remains appropriate    Co-evaluation              AM-PAC PT "6 Clicks" Mobility   Outcome Measure  Help needed turning from your back to your side while in a flat bed without using bedrails?: A Little Help needed moving from lying on your back to sitting on the side of a flat bed without using bedrails?: A Little Help needed moving to and from a bed to a  chair (including a wheelchair)?: A Little Help needed standing up from a chair using your arms (e.g., wheelchair or bedside chair)?: A Little Help needed to walk in hospital room?: A Little Help needed climbing 3-5 steps with a railing? : A Lot 6 Click Score: 17    End of Session Equipment Utilized During Treatment: Gait belt Activity Tolerance: Patient limited by fatigue Patient left: in chair;with call bell/phone within reach;with chair alarm set;with SCD's reapplied;with family/visitor present Nurse Communication: Mobility status PT Visit Diagnosis: Pain;Unsteadiness on feet (R26.81);Other abnormalities of gait and mobility (R26.89);Muscle weakness (generalized) (M62.81) Pain - Right/Left: Left Pain - part of body: Hip     Time: 8366-2947 PT Time Calculation (min) (ACUTE ONLY): 40 min  Charges:  $Gait Training: 23-37 mins $Therapeutic Activity: 8-22 mins                     Breanna Zhang PT, DPT 03/20/2018, 4:26 PM

## 2018-03-20 NOTE — Progress Notes (Addendum)
Health Team SNF authorization has been received, authorization # [id number]. Patient is are aware of above. [facility] admissions coordinator at [facility] is aware of above.   [employer], LCSW [phone]

## 2018-03-20 NOTE — Telephone Encounter (Signed)
Oral Chemotherapy Pharmacist Encounter   When the [facility] called Ms. Hefty for a medication refill, she requested another medication calendar.  Reviewed Dr. Janese Banks most recent office note and due to surgery, the patient was told to hol her medication and restart on 03/26/2018. I made Ms. Aldredge a calendar for January-March reflected a 03/26/2018 start date. Medication calendar was placed in the mail.   Darl Pikes, PharmD, BCPS, [facility] Hematology/Oncology Clinical Pharmacist [facility] [phone]  03/20/2018 4:17 PM

## 2018-03-20 NOTE — Progress Notes (Signed)
   Subjective: 1 Day Post-Op Procedure(s) (LRB): TOTAL HIP ARTHROPLASTY LEFT (Left) Patient reports pain as moderate.   Patient is well, and has had no acute complaints or problems Denies any CP, SOB, ABD pain. We will continue therapy today.   Objective: Vital signs in last 24 hours: Temp:  [97.2 F (36.2 C)-99.9 F (37.7 C)] 98.4 F (36.9 C) (01/08 0410) Pulse Rate:  [71-93] 93 (01/08 0740) Resp:  [12-30] 18 (01/08 0740) BP: (103-152)/(51-76) 140/66 (01/08 0740) SpO2:  [94 %-100 %] 98 % (01/08 0740) Weight:  [80.1 kg] 80.1 kg (01/08 0744)  Intake/Output from previous day: 01/07 0701 - 01/08 0700 In: 1480 [P.O.:480; I.V.:1000] Out: 1125 [Urine:1075; Blood:50] Intake/Output this shift: No intake/output data recorded.  Recent Labs    03/20/18 0455  HGB 10.7*   Recent Labs    03/20/18 0455  WBC 8.0  RBC 3.26*  HCT 32.6*  PLT 295   Recent Labs    03/20/18 0455  NA 135  K 3.6  CL 104  CO2 25  BUN 12  CREATININE 0.79  GLUCOSE 131*  CALCIUM 7.8*   No results for input(s): LABPT, INR in the last 72 hours.  EXAM General - Patient is Alert, Appropriate and Oriented Extremity - Neurovascular intact Sensation intact distally Intact pulses distally Dorsiflexion/Plantar flexion intact No cellulitis present Compartment soft Dressing - dressing C/D/I and no drainage, provena intact Motor Function - intact, moving foot and toes well on exam.   Past Medical History:  Diagnosis Date  . Anxiety   . Atrial fibrillation ([place])   . Breast cancer ([place]) 1999   RT MASTECTOMY  . DDD (degenerative disc disease), cervical   . Depression   . Diverticulosis   . Diverticulosis   . DVT of lower extremity (deep venous thrombosis) ([place])   . Dyspnea   . Family history of colon cancer   . Family history of lung cancer   . Family history of throat cancer   . GERD (gastroesophageal reflux disease)   . Goiter   . Headache   . History of chemotherapy 2000   BREAST CA  .  Hypertension   . Incontinence   . OSA (obstructive sleep apnea)   . Osteoporosis   . Personal history of chemotherapy 1999   BREAST CA  . Status post chemotherapy    2000 right breast cancer  . Thyroid disease   . UTI (urinary tract infection)   . Vertigo     Assessment/Plan:   1 Day Post-Op Procedure(s) (LRB): TOTAL HIP ARTHROPLASTY LEFT (Left) Active Problems:   Status post total hip replacement, left  Estimated body mass index is 28.5 kg/m as calculated from the following:   Height as of this encounter: 5\' 6"  (1.676 m).   Weight as of this encounter: 80.1 kg. Advance diet Up with therapy  Needs BM Labs and VSS Recheck labs in the am CM to assist with discharge  DVT Prophylaxis - Aspirin, TED hose and SCDs Weight-Bearing as tolerated to left leg   T. Rachelle Hora, PA-C [place] 03/20/2018, 8:10 AM

## 2018-03-20 NOTE — Clinical Social Work Note (Signed)
Clinical Social Work Assessment  Patient Details  Name: Breanna Zhang MRN: [id number] Date of Birth: 03/30/1940  Date of referral:  03/20/18               Reason for consult:  Facility Placement                Permission sought to share information with:  Chartered certified accountant granted to share information::  Yes, Verbal Permission Granted  Name::      Breanna Zhang::   [place]anna [place]   Relationship::     Contact Information:     Housing/Transportation Living arrangements for the past 2 months:  [place] of Information:  Patient, Adult Children Patient Interpreter Needed:  None Criminal Activity/Legal Involvement Pertinent to Current Situation/Hospitalization:  No - Comment as needed Significant Relationships:  Adult Children, Spouse Lives with:  Spouse Do you feel safe going back to the place where you live?  Yes Need for family participation in patient care:  Yes (Comment)  Care giving concerns:  Patient lives in [place] with her husband [place].    Social Worker assessment / plan:  Holiday representative (CSW) received SNF consult. PT is recommending SNF. CSW met with patient and her daughter [place] [phone] was at bedside. Patient was alert and oriented X4 and was sitting up in the chair. CSW introduced self and explained role of CSW department. Per patient she lives with her husband [place] in [place]. CSW explained SNF process and that Health Team will have to approve SNF. CSW explained that patient will have a co-pay of $10-$20 per day at a SNF. Patient and her daughter verbalized her understanding and are agreeable to SNF search in [name]. FL2 complete and faxed out.   CSW presented bed offers to patient and her daughter Breanna [place]. They chose [name] and are okay with a semi-private room. Per patient she has her own c-pap at bedside that she can bring to [name]. Health Team SNF authorization has been  started. CSW will continue to follow and assist as needed.   Employment status:  Disabled (Comment on whether or not currently receiving Disability), Retired Nurse, adult PT Recommendations:  [place] / Referral to community resources:  [place]  Patient/Family's Response to care:  Patient and her daughter chose [name] Patient is hoping to improve enough with PT to D/C home.   Patient/Family's Understanding of and Emotional Response to Diagnosis, Current Treatment, and Prognosis:  Patient and her daughter were very pleasant and thanked CSW for assistance.   Emotional Assessment Appearance:  Appears stated age Attitude/Demeanor/Rapport:    Affect (typically observed):  Accepting, Adaptable, Pleasant Orientation:  Oriented to Self, Oriented to Place, Oriented to  Time, Oriented to Situation Alcohol / Substance use:  Not Applicable Psych involvement (Current and /or in the community):  No (Comment)  Discharge Needs  Concerns to be addressed:  Discharge Planning Concerns Readmission within the last 30 days:  No Current discharge risk:  Dependent with Mobility Barriers to Discharge:  Continued Medical Work up   [employer], Veronia Beets, LCSW 03/20/2018, 2:20 PM

## 2018-03-20 NOTE — Anesthesia Postprocedure Evaluation (Signed)
Anesthesia Post Note  Patient: Breanna Zhang  Procedure(s) Performed: TOTAL HIP ARTHROPLASTY LEFT (Left Hip)  Patient location during evaluation: Nursing Unit Anesthesia Type: Spinal Level of consciousness: awake, awake and alert and oriented Pain management: pain level controlled Vital Signs Assessment: post-procedure vital signs reviewed and stable Respiratory status: spontaneous breathing, nonlabored ventilation and respiratory function stable Cardiovascular status: stable Postop Assessment: no headache, no backache, spinal receding, adequate PO intake, no apparent nausea or vomiting and patient able to bend at knees Anesthetic complications: no     Last Vitals:  Vitals:   03/19/18 2253 03/20/18 0410  BP: (!) 118/58 (!) 118/56  Pulse: 80 89  Resp: 17 16  Temp: 37.7 C 36.9 C  SpO2: 96% 98%    Last Pain:  Vitals:   03/20/18 0728  TempSrc:   PainSc: 6                  Jhoselyn Ruffini,  Rosalind Guido R

## 2018-03-20 NOTE — Care Management Note (Signed)
Case Management Note  Patient Details  Name: Breanna Zhang MRN: [id number] Date of Birth: February 20, 1941  Subjective/Objective:                   Met with patient and family to discuss discharge  Walks with a cane inside and a rollator outside Patient lives at home with husband and they drive her where she needs to go Patient uses [facility] in [place] Patient is able to afford medications Patient has Dr. Ramonita Lab as PCP Patient needs a 3 in 1 and a rolling walker when discharges Patient is aware of SNF recommendation from PT and is not sure what she wants to do yet Patient and family has been given the [facility] list er CMS.gov  Action/Plan: Provided Patient and Family with [facility] list per CMS.gov  Expected Discharge Date:  03/21/18               Expected Discharge Plan:     In-House Referral:     Discharge planning Services     Post Acute Care Choice:  Durable Medical Equipment Choice offered to:  Patient  DME Arranged:  3-N-1, Walker rolling DME Agency:  [place]:  PT [facility] Agency:     Status of Service:  In process, will continue to follow  If discussed at Long Length of Stay Meetings, dates discussed:    Additional Comments:  Su Hilt, RN 03/20/2018, 2:12 PM

## 2018-03-20 NOTE — Care Management (Signed)
RNCM notified Corene Cornea with [facility] that patient needs a rolling walker and 3 in 1

## 2018-03-20 NOTE — Clinical Social Work Placement (Signed)
   CLINICAL SOCIAL WORK PLACEMENT  NOTE  Date:  03/20/2018  Patient Details  Name: Breanna Zhang MRN: [id number] Date of Birth: 1940-04-21  Clinical Social Work is seeking post-discharge placement for this patient at the [place] level of care (*CSW will initial, date and re-position this form in  chart as items are completed):  Yes   Patient/family provided with [facility] Work Department's list of facilities offering this level of care within the geographic area requested by the patient (or if unable, by the patient's family).  Yes   Patient/family informed of their freedom to choose among providers that offer the needed level of care, that participate in Medicare, Medicaid or managed care program needed by the patient, have an available bed and are willing to accept the patient.  Yes   Patient/family informed of [facility]'s ownership interest in [facility] and [facility], as well as of the fact that they are under no obligation to receive care at these facilities.  PASRR submitted to EDS on 03/19/18     PASRR number received on 03/20/18     Existing PASRR number confirmed on       FL2 transmitted to all facilities in geographic area requested by pt/family on 03/19/18     FL2 transmitted to all facilities within larger geographic area on       Patient informed that his/her managed care company has contracts with or will negotiate with certain facilities, including the following:        Yes   Patient/family informed of bed offers received.  Patient chooses bed at [facility] )     Physician recommends and patient chooses bed at      Patient to be transferred to   on  .  Patient to be transferred to facility by       Patient family notified on   of transfer.  Name of family member notified:        PHYSICIAN       Additional Comment:    _______________________________________________ Aditya Nastasi, Veronia Beets, LCSW 03/20/2018,  2:19 PM

## 2018-03-20 NOTE — Progress Notes (Signed)
Physical Therapy Treatment Patient Details Name: Breanna Zhang MRN: [id number] DOB: 11-15-1940 Today's Date: 03/20/2018    History of Present Illness Pt is a 78 y/o F s/p L THA.  Pt's PMH includes vertigo, cochlear implant, osteoporosis, breast cancer, a-fib.      PT Comments    Ms. Lycan was in significant pain in L hip.  She completed therapeutic exercises before ambulating a short distance in room (6 ft) with max encouragement provided throughout.  Pt required max verbal cues for proper sequencing using RW while ambulating.  Follow up recommendations remain appropriate.    Follow Up Recommendations  SNF     Equipment Recommendations  Rolling walker with 5" wheels    Recommendations for Other Services OT consult     Precautions / Restrictions Precautions Precautions: Fall Precaution Comments: direct anterior, no hip precautions Restrictions Weight Bearing Restrictions: Yes LLE Weight Bearing: Weight bearing as tolerated    Mobility  Bed Mobility Overal bed mobility: Needs Assistance Bed Mobility: Supine to Sit     Supine to sit: HOB elevated;Mod assist     General bed mobility comments: Pt sitting in chair at start and end of session  Transfers Overall transfer level: Needs assistance Equipment used: Rolling walker (2 wheeled) Transfers: Sit to/from Stand Sit to Stand: Min assist Stand pivot transfers: Min assist;+2 physical assistance       General transfer comment: Assist to scoot to edge of seat prior to standing.  Cues for proper hand placement and Bil foot placement for improved mechanical advantage and safety.  Assist to boost to standing.    Ambulation/Gait Ambulation/Gait assistance: Min assist;+2 safety/equipment Gait Distance (Feet): 6 Feet Assistive device: Rolling walker (2 wheeled) Gait Pattern/deviations: Step-to pattern;Decreased step length - right;Decreased weight shift to left;Decreased stance time - left;Antalgic;Trunk flexed Gait  velocity: decreased   General Gait Details: Max verbal cues for sequencing using RW with min assist to advance RW.  Cues for proper hand positioning on RW.  Pt initially scoots L foot but this improves with cues to flex L knee and for foot clearance with LLE.  Distance limited due to pain.    Stairs             Wheelchair Mobility    Modified Rankin (Stroke Patients Only)       Balance Overall balance assessment: Needs assistance Sitting-balance support: No upper extremity supported;Feet supported Sitting balance-Leahy Scale: Good     Standing balance support: Single extremity supported;During functional activity Standing balance-Leahy Scale: Poor Standing balance comment: Pt relies on BUE support for static and dynamic activities                            Cognition Arousal/Alertness: Awake/alert Behavior During Therapy: WFL for tasks assessed/performed Overall Cognitive Status: Within Functional Limits for tasks assessed                                        Exercises Total Joint Exercises Ankle Circles/Pumps: AROM;Both;10 reps;Seated Quad Sets: Strengthening;Both;10 reps;Seated Hip ABduction/ADduction: AAROM;Left;10 reps;Seated Long Arc Quad: Left;5 reps;Seated;Strengthening Other Exercises Other Exercises: Pt instructed in safe use of AE/DME for LB dressing and ADL/IADL participation for safety and falls prevention.     General Comments        Pertinent Vitals/Pain Pain Assessment: Faces Pain Score: 5  Faces Pain Scale: Hurts whole  lot Pain Location: L hip Pain Descriptors / Indicators: Aching;Grimacing;Guarding;Moaning Pain Intervention(s): Limited activity within patient's tolerance;Monitored during session;Utilized relaxation techniques;Ice applied    Home Living Family/patient expects to be discharged to:: Private residence Living Arrangements: Spouse/significant other Available Help at Discharge: Family;Available  PRN/intermittently Type of Home: Mobile home Home Access: Ramped entrance   Home Layout: One level Home Equipment: [place] - 4 wheels;Cane - single point;Shower seat;Grab bars - tub/shower      Prior Function Level of Independence: Independent with assistive device(s)      Comments: Ambulates with rollator in community and sometimes in the home, otherwise uses SPC in the home.  No falls in the past 6 months.  Husband and pt both do the cooking.  Pt does the cleaning.  Ind with bathing, dressing.    PT Goals (current goals can now be found in the care plan section) Acute Rehab PT Goals Patient Stated Goal: to be able to walk PT Goal Formulation: With patient Time For Goal Achievement: 04/02/18 Potential to Achieve Goals: Good Progress towards PT goals: Progressing toward goals    Frequency    BID      PT Plan Current plan remains appropriate    Co-evaluation              AM-PAC PT "6 Clicks" Mobility   Outcome Measure  Help needed turning from your back to your side while in a flat bed without using bedrails?: A Lot Help needed moving from lying on your back to sitting on the side of a flat bed without using bedrails?: A Lot Help needed moving to and from a bed to a chair (including a wheelchair)?: A Lot Help needed standing up from a chair using your arms (e.g., wheelchair or bedside chair)?: A Little Help needed to walk in hospital room?: A Little Help needed climbing 3-5 steps with a railing? : Total 6 Click Score: 13    End of Session Equipment Utilized During Treatment: Gait belt Activity Tolerance: Patient limited by pain;Patient limited by fatigue Patient left: in chair;with call bell/phone within reach;with chair alarm set;with SCD's reapplied;with family/visitor present Nurse Communication: Mobility status;Other (comment)(pt's level of pain) PT Visit Diagnosis: Pain;Unsteadiness on feet (R26.81);Other abnormalities of gait and mobility (R26.89);Muscle  weakness (generalized) (M62.81) Pain - Right/Left: Left Pain - part of body: Hip     Time: 1033-1100 PT Time Calculation (min) (ACUTE ONLY): 27 min  Charges:  $Gait Training: 8-22 mins $Therapeutic Exercise: 8-22 mins                     Collie Siad PT, DPT 03/20/2018, 12:07 PM

## 2018-03-21 ENCOUNTER — Inpatient Hospital Stay: Payer: PPO

## 2018-03-21 ENCOUNTER — Telehealth: Payer: Self-pay | Admitting: *Deleted

## 2018-03-21 ENCOUNTER — Telehealth: Payer: Self-pay | Admitting: Pharmacist

## 2018-03-21 LAB — URINALYSIS, COMPLETE (UACMP) WITH MICROSCOPIC
Bilirubin Urine: NEGATIVE
Glucose, UA: NEGATIVE mg/dL
KETONES UR: NEGATIVE mg/dL
Nitrite: NEGATIVE
Protein, ur: NEGATIVE mg/dL
Specific Gravity, Urine: 1.008 (ref 1.005–1.030)
pH: 7 (ref 5.0–8.0)

## 2018-03-21 LAB — BASIC METABOLIC PANEL
Anion gap: 3 — ABNORMAL LOW (ref 5–15)
BUN: 13 mg/dL (ref 8–23)
CALCIUM: 7.6 mg/dL — AB (ref 8.9–10.3)
CO2: 27 mmol/L (ref 22–32)
Chloride: 105 mmol/L (ref 98–111)
Creatinine, Ser: 0.72 mg/dL (ref 0.44–1.00)
GFR calc Af Amer: >60 mL/min (ref >60)
GFR calc non Af Amer: >60 mL/min (ref >60)
Glucose, Bld: 145 mg/dL — ABNORMAL HIGH (ref 70–99)
Potassium: 3.9 mmol/L (ref 3.5–5.1)
Sodium: 135 mmol/L (ref 135–145)

## 2018-03-21 LAB — CBC
HCT: 31 % — ABNORMAL LOW (ref 36.0–46.0)
Hemoglobin: 10 g/dL — ABNORMAL LOW (ref 12.0–15.0)
MCH: 32.4 pg (ref 26.0–34.0)
MCHC: 32.3 g/dL (ref 30.0–36.0)
MCV: 100.3 fL — ABNORMAL HIGH (ref 80.0–100.0)
Platelets: 279 10*3/uL (ref 150–400)
RBC: 3.09 MIL/uL — ABNORMAL LOW (ref 3.87–5.11)
RDW: 12.5 % (ref 11.5–15.5)
WBC: 11.4 10*3/uL — ABNORMAL HIGH (ref 4.0–10.5)
nRBC: 0 % (ref 0.0–0.2)

## 2018-03-21 MED ORDER — ENOXAPARIN SODIUM 30 MG/0.3ML ~~LOC~~ SOLN: 30.0000 mg | Freq: Two times a day (BID) | SUBCUTANEOUS | 0 refills | Status: DC

## 2018-03-21 MED ORDER — CEPHALEXIN 500 MG PO CAPS: 500.0000 mg | ORAL_CAPSULE | Freq: Four times a day (QID) | ORAL | 0 refills | Status: AC

## 2018-03-21 MED ORDER — HYDROCODONE-ACETAMINOPHEN 5-325 MG PO TABS: 1.0000 | ORAL_TABLET | ORAL | 0 refills | Status: DC | PRN

## 2018-03-21 MED ORDER — ENOXAPARIN SODIUM 30 MG/0.3ML ~~LOC~~ SOLN
30.0000 mg | Freq: Two times a day (BID) | SUBCUTANEOUS | Status: DC
  Administered 2018-03-21 – 2018-03-22 (×3): 30 mg via SUBCUTANEOUS
  Filled 2018-03-21 (×3): qty 0.3

## 2018-03-21 MED ORDER — CEPHALEXIN 500 MG PO CAPS
500.0000 mg | ORAL_CAPSULE | Freq: Four times a day (QID) | ORAL | Status: DC
  Administered 2018-03-21 – 2018-03-22 (×4): 500 mg via ORAL
  Filled 2018-03-21 (×4): qty 1

## 2018-03-21 MED ORDER — BISACODYL 10 MG RE SUPP
10.0000 mg | Freq: Once | RECTAL | Status: AC
  Administered 2018-03-21: 10 mg via RECTAL
  Filled 2018-03-21: qty 1

## 2018-03-21 MED FILL — IBRANCE 100 MG CAPSULE: 100 | 28 days supply | Qty: 21 | Fill #0

## 2018-03-21 NOTE — Progress Notes (Signed)
   Subjective: 2 Days Post-Op Procedure(s) (LRB): TOTAL HIP ARTHROPLASTY LEFT (Left) Patient reports pain as mild.   Patient is well, and has had no acute complaints or problems Denies any CP, SOB, ABD pain. We will continue therapy today.   Objective: Vital signs in last 24 hours: Temp:  [98.4 F (36.9 C)-99.8 F (37.7 C)] 99.8 F (37.7 C) (01/08 2330) Pulse Rate:  [88-96] 96 (01/08 2330) Resp:  [16-18] 16 (01/08 2330) BP: (121-140)/(56-69) 121/69 (01/08 2330) SpO2:  [95 %-98 %] 95 % (01/08 2330) Weight:  [80.1 kg] 80.1 kg (01/08 0744)  Intake/Output from previous day: 01/08 0701 - 01/09 0700 In: 840 [P.O.:840] Out: 1150 [Urine:1150] Intake/Output this shift: No intake/output data recorded.  Recent Labs    03/20/18 0455 03/21/18 0432  HGB 10.7* 10.0*   Recent Labs    03/20/18 0455 03/21/18 0432  WBC 8.0 11.4*  RBC 3.26* 3.09*  HCT 32.6* 31.0*  PLT 295 279   Recent Labs    03/20/18 0455 03/21/18 0432  NA 135 135  K 3.6 3.9  CL 104 105  CO2 25 27  BUN 12 13  CREATININE 0.79 0.72  GLUCOSE 131* 145*  CALCIUM 7.8* 7.6*   No results for input(s): LABPT, INR in the last 72 hours.  EXAM General - Patient is Alert, Appropriate and Oriented Extremity - Neurovascular intact Sensation intact distally Intact pulses distally Dorsiflexion/Plantar flexion intact No cellulitis present Compartment soft Dressing - dressing C/D/I and no drainage, provena intact with out drainage Motor Function - intact, moving foot and toes well on exam.   Past Medical History:  Diagnosis Date  . Anxiety   . Atrial fibrillation ([place])   . Breast cancer ([place]) 1999   RT MASTECTOMY  . DDD (degenerative disc disease), cervical   . Depression   . Diverticulosis   . Diverticulosis   . DVT of lower extremity (deep venous thrombosis) ([place])   . Dyspnea   . Family history of colon cancer   . Family history of lung cancer   . Family history of throat cancer   . GERD  (gastroesophageal reflux disease)   . Goiter   . Headache   . History of chemotherapy 2000   BREAST CA  . Hypertension   . Incontinence   . OSA (obstructive sleep apnea)   . Osteoporosis   . Personal history of chemotherapy 1999   BREAST CA  . Status post chemotherapy    2000 right breast cancer  . Thyroid disease   . UTI (urinary tract infection)   . Vertigo     Assessment/Plan:   2 Days Post-Op Procedure(s) (LRB): TOTAL HIP ARTHROPLASTY LEFT (Left) Active Problems:   Status post total hip replacement, left  Estimated body mass index is 28.5 kg/m as calculated from the following:   Height as of this encounter: 5\' 6"  (1.676 m).   Weight as of this encounter: 80.1 kg. Advance diet Up with therapy  Needs BM Labs and VSS CM to assist with discharge to SNF  DVT Prophylaxis - Aspirin, TED hose and SCDs Weight-Bearing as tolerated to left leg   T. Rachelle Hora, PA-C [facility] 03/21/2018, 7:22 AM

## 2018-03-21 NOTE — Progress Notes (Signed)
OT Cancellation Note  Patient Details Name: Breanna Zhang MRN: [id number] DOB: [birth year]   Cancelled Treatment:    Reason Eval/Treat Not Completed: Medical issues which prohibited therapy. Spoke with PT. Per RN pt not feeling well this morning. Chest x-ray and abdominal x-ray pending.  Will hold OT until imaging complete and POC established.  Jeni Salles, MPH, MS, OTR/L ascom [phone] 03/21/18, 10:36 AM

## 2018-03-21 NOTE — Progress Notes (Signed)
Pts temp 101.2, after incentive spirometry now 100.1. MD Rudene Christians and Rachelle Hora notified. Orders received for chest xray, KUB, and UA. Orders placed.

## 2018-03-21 NOTE — Telephone Encounter (Signed)
Called today to the house and got voicemail and left a message for the daughter of Ms. Burges to let her know that Ebony Hail the pharmacist for the oral medicine that Ms. Scheuermann is on called and spoke with you guys and because of the surgery and holding the drug for right now we needed to see her back on February 10 instead of January 20 so I have made that change in the appointment on February 10 will be 930 for labs and 10:00 to see the doctor.  Also left my direct number in case this is not correct or we do not have the accurate information as of yesterday  to have her daughter Ms. Robbins to call me back```

## 2018-03-21 NOTE — Progress Notes (Signed)
PT Cancellation Note  Patient Details Name: Breanna Zhang MRN: [id number] DOB: [birth year]   Cancelled Treatment:    Reason Eval/Treat Not Completed: Other (comment).  Pt's L thigh red, swollen, and painful with gentle squeeze.  Pt now pending doppler of LLE.   Will hold PT until results confirmed and POC established.    Collie Siad PT, DPT 03/21/2018, 10:56 AM

## 2018-03-21 NOTE — Discharge Summary (Addendum)
Physician Discharge Summary  Patient ID: Breanna Zhang MRN: [id number] DOB/AGE: 07/21/1940 78 y.o.  Admit date: 03/19/2018 Discharge date: 03/22/2018 Admission Diagnoses:  PRIMARY OSTEOARTHRITIS OF LEFT HIP   Discharge Diagnoses: Patient Active Problem List   Diagnosis Date Noted  . Status post total hip replacement, left 03/19/2018  . Genetic testing 02/12/2018  . Family history of colon cancer   . Family history of lung cancer   . Family history of throat cancer   . Leukopenia due to antineoplastic chemotherapy ([place]) 12/04/2017  . Chest pain at rest 11/14/2017  . Hyperlipidemia 11/14/2017  . Shortness of breath 11/14/2017  . Metastatic breast cancer ([place]) 09/17/2017  . Lung cancer ([place]) 09/10/2017  . Headache disorder 02/26/2017  . Arthralgia of both hands 05/24/2016  . Personal history of malignant neoplasm of breast 09/08/2015  . Adult hypothyroidism 04/06/2015  . Blood glucose elevated 09/07/2014  . Deafness, sensorineural 07/20/2014  . Clinical depression 06/22/2014  . Acid reflux 06/22/2014  . BP (high blood pressure) 06/22/2014  . Malignant neoplasm of breast ([place]) 06/22/2014  . Frequent UTI 06/22/2014  . Apnea, sleep 06/22/2014  . Head revolving around 06/22/2014  . Dizziness 04/16/2014  . Degeneration of intervertebral disc of lumbar region 10/06/2013  . Neuritis or radiculitis due to rupture of lumbar intervertebral disc 10/06/2013  . Arthritis of knee, degenerative 10/06/2013  . Lumbar radiculitis 10/06/2013  . Cystocele, midline 11/16/2011  . Female genuine stress incontinence 11/16/2011  . Incomplete bladder emptying 11/16/2011  . 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 Excessive urination at night 11/16/2011  . Neuralgia neuritis, sciatic nerve 11/16/2011  . Urge incontinence of urine 11/16/2011    Past Medical History:  Diagnosis Date  . Anxiety   . Atrial fibrillation ([place])   . Breast cancer ([facility]) 1999   RT MASTECTOMY  . DDD (degenerative  disc disease), cervical   . Depression   . Diverticulosis   . Diverticulosis   . DVT of lower extremity (deep venous thrombosis) ([place])   . Dyspnea   . Family history of colon cancer   . Family history of lung cancer   . Family history of throat cancer   . GERD (gastroesophageal reflux disease)   . Goiter   . Headache   . History of chemotherapy 2000   BREAST CA  . Hypertension   . Incontinence   . OSA (obstructive sleep apnea)   . Osteoporosis   . Personal history of chemotherapy 1999   BREAST CA  . Status post chemotherapy    2000 right breast cancer  . Thyroid disease   . UTI (urinary tract infection)   . Vertigo      Transfusion: NONE   Consultants (if any):   Discharged Condition: Improved  Hospital Course: Breanna Zhang is an 78 y.o. female who was admitted 03/19/2018 with a diagnosis of left hip osteoarthritis and went to the operating room on 03/19/2018 and underwent the above named procedures.    Surgeries: Procedure(s): TOTAL HIP ARTHROPLASTY LEFT on 03/19/2018 Patient tolerated the surgery well. Taken to PACU where she was stabilized and then transferred to the orthopedic floor.  Started on Lovenox 30 MG  q 12 hrs. Foot pumps applied bilaterally at 80 mm. Heels elevated on bed with rolled towels. No evidence of DVT. Negative Homan. Physical therapy started on day #1 for gait training and transfer. OT started day #1 for ADL and assisted devices.  Patient's foley was d/c on day #1. Patient's IV and  was  d/c on day #2. On post op day 2 patient noted to have fever. UA showed UTI, started on cephalexin. CXR / KUB negative.  On post op day #3 patient was stable and ready for discharge to snf.  Implants: Medacta AMIS 4 standard stem with S metal 28 mm head, 54 mm Mpact DM cup and liner  She was given perioperative antibiotics:  Anti-infectives (From admission, onward)   Start     Dose/Rate Route Frequency Ordered Stop   03/21/18 1800  cephALEXin (KEFLEX) capsule 500  mg     500 mg Oral Every 6 hours 03/21/18 1539 03/28/18 1759   03/21/18 0000  cephALEXin (KEFLEX) 500 MG capsule     500 mg Oral Every 6 hours 03/21/18 1551 03/26/18 2359   03/19/18 0805  gentamicin (GARAMYCIN) 80 mg in sodium chloride 0.9 % 500 mL irrigation  Status:  Discontinued       As needed 03/19/18 0817 03/19/18 0854   03/19/18 0559  clindamycin (CLEOCIN) 900 MG/50ML IVPB    Note to Pharmacy:  Phineas Real   : cabinet override      03/19/18 0559 03/19/18 0756   03/18/18 2145  clindamycin (CLEOCIN) IVPB 900 mg     900 mg 100 mL/hr over 30 Minutes Intravenous  Once 03/18/18 2136 03/19/18 0756    .  She was given sequential compression devices, early ambulation, and lOVENOX, teds for DVT prophylaxis.  Please remove provena negative pressure dressing on 03/29/2018 and apply honey comb dressing. Keep dressing clean and dry at all times.   She benefited maximally from the hospital stay and there were no complications.    Recent vital signs:  Vitals:   03/22/18 0513 03/22/18 0757  BP:  140/60  Pulse:  98  Resp:  18  Temp: 98.6 F (37 C) 99.8 F (37.7 C)  SpO2:  100%    Recent laboratory studies:  Lab Results  Component Value Date   HGB 10.0 (L) 03/21/2018   HGB 10.7 (L) 03/20/2018   HGB 12.0 03/07/2018   Lab Results  Component Value Date   WBC 11.4 (H) 03/21/2018   PLT 279 03/21/2018   Lab Results  Component Value Date   INR 1.30 03/07/2018   Lab Results  Component Value Date   NA 135 03/21/2018   K 3.9 03/21/2018   CL 105 03/21/2018   CO2 27 03/21/2018   BUN 13 03/21/2018   CREATININE 0.72 03/21/2018   GLUCOSE 145 (H) 03/21/2018    Discharge Medications:   Allergies as of 03/22/2018      Reactions   Imipramine Tinitus   Latex Rash   Penicillins Rash   Tape Rash      Medication List    STOP taking these medications   oxyCODONE 5 MG immediate release tablet Commonly known as:  Oxy IR/ROXICODONE     TAKE these medications   atorvastatin 40  MG tablet Commonly known as:  LIPITOR Take 40 mg by mouth at bedtime.   cephALEXin 500 MG capsule Commonly known as:  KEFLEX Take 1 capsule (500 mg total) by mouth every 6 (six) hours for 5 days.   cholecalciferol 1000 units tablet Commonly known as:  VITAMIN D Take 1,000 Units by mouth daily.   diazepam 5 MG tablet Commonly known as:  VALIUM Take 5 mg by mouth every 12 (twelve) hours as needed for anxiety.   DULoxetine 60 MG capsule Commonly known as:  CYMBALTA Take 1 capsule (60 mg total) by mouth  every morning.   enoxaparin 30 MG/0.3ML injection Commonly known as:  LOVENOX Inject 0.3 mLs (30 mg total) into the skin every 12 (twelve) hours for 14 days.   IBRANCE 100 MG capsule Generic drug:  palbociclib TAKE 1 CAPSULE (100 MG TOTAL) BY MOUTH DAILY WITH BREAKFAST. TAKE WHOLE WITH FOOD. TAKE FOR 21 DAYS ON, 7 DAYS OFF, REPEAT EVERY 28 DAYS. What changed:  See the new instructions.   letrozole 2.5 MG tablet Commonly known as:  FEMARA TAKE 1 TABLET BY MOUTH EVERY DAY   ondansetron 4 MG tablet Commonly known as:  ZOFRAN Take 1 tablet (4 mg total) by mouth every 6 (six) hours as needed for nausea or vomiting.   pantoprazole 40 MG tablet Commonly known as:  PROTONIX Take 40 mg by mouth 2 (two) times daily.   SYNTHROID 88 MCG tablet Generic drug:  levothyroxine Take 88 mcg by mouth daily before breakfast.   traMADol 50 MG tablet Commonly known as:  ULTRAM Take 1 tablet (50 mg total) by mouth every 6 (six) hours as needed for severe pain.   traZODone 100 MG tablet Commonly known as:  DESYREL Take 100 mg by mouth at bedtime.   V-R VITAMIN B-12 500 MCG tablet Generic drug:  vitamin B-12 Take 500 mcg by mouth daily.       Diagnostic Studies: Dg Chest 2 View  Result Date: 03/21/2018 CLINICAL DATA:  Fever.  History of breast carcinoma. EXAM: CHEST - 2 VIEW COMPARISON:  10/30/2017 FINDINGS: Heart size upper limits of normal in size. Chronic aortic atherosclerosis. Poor  inspiration. Blunting of the posterior costophrenic angles suggesting small effusions. The lungs are otherwise clear. Ordinary degenerative changes affect the spine. Previous right mastectomy. No acute bone finding. IMPRESSION: Poor inspiration. Small effusions in the posterior costophrenic angles. Electronically Signed   By: Nelson Chimes M.D.   On: 03/21/2018 10:01   Dg Abd 1 View  Result Date: 03/21/2018 CLINICAL DATA:  Fever. History of breast cancer. EXAM: ABDOMEN - 1 VIEW COMPARISON:  CT 01/01/2018 FINDINGS: Previous cholecystectomy. Surgical clips in the region of the right colon. Moderate amount of fecal matter within the right colon. No evidence of bowel obstruction pattern. Ordinary degenerative changes affect the lumbar spine. Previous hip replacement on the left. IMPRESSION: No acute finding. Previous cholecystectomy. Old surgical clips in the region of the right colon. Moderate amount of fecal matter in the right colon. Electronically Signed   By: Nelson Chimes M.D.   On: 03/21/2018 10:02   US Venous Img Lower Unilateral Left  Result Date: 03/21/2018 CLINICAL DATA:  Left lower extremity pain and edema. Left total hip replacement performed 03/19/2018. History of DVT. History of breast cancer. Evaluate for acute or chronic DVT. EXAM: LEFT LOWER EXTREMITY VENOUS DOPPLER ULTRASOUND TECHNIQUE: Gray-scale sonography with graded compression, as well as color Doppler and duplex ultrasound were performed to evaluate the lower extremity deep venous systems from the level of the common femoral vein and including the common femoral, femoral, profunda femoral, popliteal and calf veins including the posterior tibial, peroneal and gastrocnemius veins when visible. The superficial great saphenous vein was also interrogated. Spectral Doppler was utilized to evaluate flow at rest and with distal augmentation maneuvers in the common femoral, femoral and popliteal veins. COMPARISON:  None. FINDINGS: Contralateral  Common Femoral Vein: Respiratory phasicity is normal and symmetric with the symptomatic side. No evidence of thrombus. Normal compressibility. Common Femoral Vein: No evidence of thrombus. Normal compressibility, respiratory phasicity and response to augmentation. Saphenofemoral  Junction: No evidence of thrombus. Normal compressibility and flow on color Doppler imaging. Profunda Femoral Vein: No evidence of thrombus. Normal compressibility and flow on color Doppler imaging. Femoral Vein: No evidence of thrombus. Normal compressibility, respiratory phasicity and response to augmentation. Popliteal Vein: No evidence of thrombus. Normal compressibility, respiratory phasicity and response to augmentation. Calf Veins: No evidence of thrombus. Normal compressibility and flow on color Doppler imaging. Superficial Great Saphenous Vein: No evidence of thrombus. Normal compressibility. Venous Reflux:  None. Other Findings:  None. IMPRESSION: No evidence of acute or chronic DVT within the left lower extremity. Electronically Signed   By: Sandi Mariscal M.D.   On: 03/21/2018 12:43   Dg Hip Operative Unilat W Or W/o Pelvis Left  Result Date: 03/19/2018 CLINICAL DATA:  Left hip arthritis. EXAM: OPERATIVE LEFT HIP (WITH PELVIS IF PERFORMED) 2 VIEWS TECHNIQUE: Fluoroscopic spot image(s) were submitted for interpretation post-operatively. COMPARISON:  None. FINDINGS: AP views of the left hip before and after total hip prosthesis insertion demonstrate the patient has now undergone a left total hip prosthesis insertion. Normal appearance in the AP projection. IMPRESSION: Satisfactory appearance of the left hip in the AP projection after total hip prosthesis insertion. Electronically Signed   By: Lorriane Shire M.D.   On: 03/19/2018 09:05   Dg Hip Unilat W Or W/o Pelvis 2-3 Views Left  Result Date: 03/19/2018 CLINICAL DATA:  Post left hip replacement EXAM: DG HIP (WITH OR WITHOUT PELVIS) 2-3V LEFT COMPARISON:  CT abdomen pelvis of  01/01/2018 FINDINGS: The components of the left hip replacement are in good position. Only on the lateral view there is a is there are linear lucency overlying the proximal to mid left femoral shaft. This probably represents fat planes between musculature overlying the femur, but clinical correlation is recommended. No abnormality is seen on the frontal view in that region. The left ramus is intact. IMPRESSION: 1. Components of left hip replacement are in good position. 2. Linear lucency on the lateral view overlying the mid proximal left femur most likely is artifactual. Correlate clinically. Electronically Signed   By: Ivar Drape M.D.   On: 03/19/2018 09:26    Disposition: Discharge disposition: 03-Skilled [place] information for follow-up providers    Duanne Guess, PA-C On 04/03/2018.   Specialties:  Orthopedic Surgery, Emergency Medicine Why:  @ 9:15 am Contact information: [address] [place] [address] [phone]            Contact information for after-discharge care    Destination    [facility]erred [facility]F .   Service:  Skilled Nursing Contact information: [address] [place] [phone]                   Signed: Dorise Hiss [facility] 03/22/2018, 10:28 AM

## 2018-03-21 NOTE — Telephone Encounter (Signed)
Oral Chemotherapy Pharmacist Encounter  Successfully enrolled patient for copayment assistance funds from [employer] from the Breast Cancer fund.  Award amount: $16,000 Effective dates: 03/21/2018 - 03/21/2019 ID: [id number] BIN: [id number] Group: [id number] PCN: [id number]  Billing information will be shared with [facility]. I will place a copy of the award letter to be scanned into patient's chart.  Darl Pikes, PharmD, BCPS Hematology/Oncology Clinical Pharmacist [facility] [phone]  03/21/2018 10:04 AM

## 2018-03-21 NOTE — Progress Notes (Signed)
OT Cancellation Note  Patient Details Name: Breanna Zhang MRN: [id number] DOB: 05-19-1940   Cancelled Treatment:    Reason Eval/Treat Not Completed: Medical issues which prohibited therapy. Chart reviewed. Pt's L thigh red, swollen, and painful with gentle squeeze.  Pt now pending doppler of LLE.   Will hold OT treatment until results confirmed and POC established.   Jeni Salles, MPH, MS, OTR/L ascom [phone] 03/21/18, 11:35 AM

## 2018-03-21 NOTE — Progress Notes (Signed)
Pts left leg painful to touch, warm, swollen and red. PA Rachelle Hora notified. Orders received for venous ultrasound doppler lower extremity. Order placed.

## 2018-03-21 NOTE — Progress Notes (Signed)
Physical Therapy Treatment Patient Details Name: Breanna Zhang MRN: [id number] DOB: 01/06/1941 Today's Date: 03/21/2018    History of Present Illness Pt is a 78 y/o F s/p L THA.  Pt's PMH includes vertigo, cochlear implant, osteoporosis, breast cancer, a-fib.      PT Comments    [name] reports not feeling as well as yesterday but agreeable to work with therapy.  Pt requires encouragement with mobility due to pain.  She requires max verbal and tactile cues for safety with transfers and bed mobility.  Pt ambulated 35 ft, limited by pain and fatigue.  Follow up recommendations remain appropriate.    Follow Up Recommendations  SNF     Equipment Recommendations  Rolling walker with 5" wheels    Recommendations for Other Services       Precautions / Restrictions Precautions Precautions: Fall Precaution Comments: direct anterior, no hip precautions Restrictions Weight Bearing Restrictions: Yes LLE Weight Bearing: Weight bearing as tolerated    Mobility  Bed Mobility Overal bed mobility: Needs Assistance Bed Mobility: Supine to Sit     Supine to sit: Mod assist;HOB elevated     General bed mobility comments: Assist elevating trunk and bringing LEs to EOB.  Cues for sequencing. Heavy use of bed rail.   Transfers Overall transfer level: Needs assistance Equipment used: Rolling walker (2 wheeled) Transfers: Sit to/from Stand Sit to Stand: Min assist;+2 physical assistance Stand pivot transfers: Min assist;+2 physical assistance       General transfer comment: Assist to boost to standing after verbal and tactile cues provided for proper hand and Bil foot placement.  Assist for controlled descent to sit with max cues for hand placement with stand>sit.  Very poor safety awareness with sit<>stand transfers.  Ambulation/Gait Ambulation/Gait assistance: Min assist;+2 safety/equipment Gait Distance (Feet): 35 Feet Assistive device: Rolling walker (2 wheeled) Gait  Pattern/deviations: Step-to pattern;Decreased step length - right;Decreased weight shift to left;Decreased stance time - left;Antalgic;Trunk flexed Gait velocity: decreased Gait velocity interpretation: <1.31 ft/sec, indicative of household ambulator General Gait Details: Verbal cues for sequencing using RW and for forward gaze.  Very dec gait speed but pt remains steady.    Stairs             Wheelchair Mobility    Modified Rankin (Stroke Patients Only)       Balance Overall balance assessment: Needs assistance Sitting-balance support: No upper extremity supported;Feet supported Sitting balance-Leahy Scale: Good     Standing balance support: Single extremity supported;During functional activity Standing balance-Leahy Scale: Poor Standing balance comment: Pt relies on BUE support for static and dynamic activities                            Cognition Arousal/Alertness: Awake/alert Behavior During Therapy: WFL for tasks assessed/performed Overall Cognitive Status: Within Functional Limits for tasks assessed                                        Exercises Total Joint Exercises Ankle Circles/Pumps: AROM;Both;10 reps;Supine Quad Sets: Strengthening;Both;10 reps;Supine Hip ABduction/ADduction: AAROM;Strengthening;Left;10 reps;Supine    General Comments General comments (skin integrity, edema, etc.): BP taken in supine 137/72, in sitting EOB 122/102, sitting at end of session 116/104      Pertinent Vitals/Pain Pain Assessment: Faces Faces Pain Scale: Hurts whole lot Pain Location: L hip Pain Descriptors / Indicators: Aching;Grimacing;Guarding  Pain Intervention(s): Limited activity within patient's tolerance;Premedicated before session;Monitored during session;Repositioned;Utilized relaxation techniques    Home Living                      Prior Function            PT Goals (current goals can now be found in the care plan  section) Acute Rehab PT Goals Patient Stated Goal: to be able to walk PT Goal Formulation: With patient Time For Goal Achievement: 04/02/18 Potential to Achieve Goals: Good Progress towards PT goals: Not progressing toward goals - comment(due to pt not feeling as well today)    Frequency    BID      PT Plan Current plan remains appropriate    Co-evaluation              AM-PAC PT "6 Clicks" Mobility   Outcome Measure  Help needed turning from your back to your side while in a flat bed without using bedrails?: A Lot Help needed moving from lying on your back to sitting on the side of a flat bed without using bedrails?: A Lot Help needed moving to and from a bed to a chair (including a wheelchair)?: A Lot Help needed standing up from a chair using your arms (e.g., wheelchair or bedside chair)?: A Lot Help needed to walk in hospital room?: A Little Help needed climbing 3-5 steps with a railing? : Total 6 Click Score: 12    End of Session Equipment Utilized During Treatment: Gait belt Activity Tolerance: Patient limited by fatigue;Patient limited by pain Patient left: in chair;with call bell/phone within reach;with chair alarm set;with SCD's reapplied;with family/visitor present Nurse Communication: Mobility status;Other (comment)(BP readings) PT Visit Diagnosis: Pain;Unsteadiness on feet (R26.81);Other abnormalities of gait and mobility (R26.89);Muscle weakness (generalized) (M62.81) Pain - Right/Left: Left Pain - part of body: Hip     Time: 8563-1497 PT Time Calculation (min) (ACUTE ONLY): 54 min  Charges:  $Gait Training: 8-22 mins $Therapeutic Exercise: 8-22 mins $Therapeutic Activity: 23-37 mins                     Collie Siad PT, DPT 03/21/2018, 2:46 PM

## 2018-03-21 NOTE — Discharge Instructions (Signed)

## 2018-03-21 NOTE — Progress Notes (Signed)
PT Cancellation Note  Patient Details Name: Breanna Zhang MRN: [id number] DOB: August 19, 1940   Cancelled Treatment:    Reason Eval/Treat Not Completed: Other (comment).  Per RN pt not feeling well this morning.  Chest x-ray and abdominal x-ray pending.  Will hold PT until imaging complete and POC established.    Collie Siad PT, DPT 03/21/2018, 9:39 AM

## 2018-03-22 DIAGNOSIS — E039 Hypothyroidism, unspecified: Secondary | ICD-10-CM | POA: Diagnosis not present

## 2018-03-22 DIAGNOSIS — H905 Unspecified sensorineural hearing loss: Secondary | ICD-10-CM | POA: Diagnosis not present

## 2018-03-22 DIAGNOSIS — F321 Major depressive disorder, single episode, moderate: Secondary | ICD-10-CM | POA: Diagnosis not present

## 2018-03-22 DIAGNOSIS — N3000 Acute cystitis without hematuria: Secondary | ICD-10-CM | POA: Diagnosis not present

## 2018-03-22 DIAGNOSIS — F3342 Major depressive disorder, recurrent, in full remission: Secondary | ICD-10-CM | POA: Diagnosis not present

## 2018-03-22 DIAGNOSIS — Z96642 Presence of left artificial hip joint: Secondary | ICD-10-CM | POA: Diagnosis not present

## 2018-03-22 DIAGNOSIS — M810 Age-related osteoporosis without current pathological fracture: Secondary | ICD-10-CM | POA: Diagnosis not present

## 2018-03-22 DIAGNOSIS — R5381 Other malaise: Secondary | ICD-10-CM | POA: Diagnosis not present

## 2018-03-22 DIAGNOSIS — M2550 Pain in unspecified joint: Secondary | ICD-10-CM | POA: Diagnosis not present

## 2018-03-22 DIAGNOSIS — Z79818 Long term (current) use of other agents affecting estrogen receptors and estrogen levels: Secondary | ICD-10-CM | POA: Diagnosis not present

## 2018-03-22 DIAGNOSIS — Z79811 Long term (current) use of aromatase inhibitors: Secondary | ICD-10-CM | POA: Diagnosis not present

## 2018-03-22 DIAGNOSIS — K5909 Other constipation: Secondary | ICD-10-CM | POA: Diagnosis not present

## 2018-03-22 DIAGNOSIS — E785 Hyperlipidemia, unspecified: Secondary | ICD-10-CM | POA: Diagnosis not present

## 2018-03-22 DIAGNOSIS — D649 Anemia, unspecified: Secondary | ICD-10-CM | POA: Diagnosis not present

## 2018-03-22 DIAGNOSIS — M1612 Unilateral primary osteoarthritis, left hip: Secondary | ICD-10-CM | POA: Diagnosis not present

## 2018-03-22 DIAGNOSIS — I10 Essential (primary) hypertension: Secondary | ICD-10-CM | POA: Diagnosis not present

## 2018-03-22 DIAGNOSIS — C3490 Malignant neoplasm of unspecified part of unspecified bronchus or lung: Secondary | ICD-10-CM | POA: Diagnosis not present

## 2018-03-22 DIAGNOSIS — Z9621 Cochlear implant status: Secondary | ICD-10-CM | POA: Diagnosis not present

## 2018-03-22 DIAGNOSIS — C50912 Malignant neoplasm of unspecified site of left female breast: Secondary | ICD-10-CM | POA: Diagnosis not present

## 2018-03-22 DIAGNOSIS — M6281 Muscle weakness (generalized): Secondary | ICD-10-CM | POA: Diagnosis not present

## 2018-03-22 DIAGNOSIS — F3340 Major depressive disorder, recurrent, in remission, unspecified: Secondary | ICD-10-CM | POA: Diagnosis not present

## 2018-03-22 DIAGNOSIS — E890 Postprocedural hypothyroidism: Secondary | ICD-10-CM | POA: Diagnosis not present

## 2018-03-22 DIAGNOSIS — R42 Dizziness and giddiness: Secondary | ICD-10-CM | POA: Diagnosis not present

## 2018-03-22 DIAGNOSIS — M5136 Other intervertebral disc degeneration, lumbar region: Secondary | ICD-10-CM | POA: Diagnosis not present

## 2018-03-22 DIAGNOSIS — M150 Primary generalized (osteo)arthritis: Secondary | ICD-10-CM | POA: Diagnosis not present

## 2018-03-22 DIAGNOSIS — R300 Dysuria: Secondary | ICD-10-CM | POA: Diagnosis not present

## 2018-03-22 DIAGNOSIS — C50919 Malignant neoplasm of unspecified site of unspecified female breast: Secondary | ICD-10-CM | POA: Diagnosis not present

## 2018-03-22 DIAGNOSIS — G4733 Obstructive sleep apnea (adult) (pediatric): Secondary | ICD-10-CM | POA: Diagnosis not present

## 2018-03-22 DIAGNOSIS — Z853 Personal history of malignant neoplasm of breast: Secondary | ICD-10-CM | POA: Diagnosis not present

## 2018-03-22 DIAGNOSIS — Z471 Aftercare following joint replacement surgery: Secondary | ICD-10-CM | POA: Diagnosis not present

## 2018-03-22 DIAGNOSIS — C7802 Secondary malignant neoplasm of left lung: Secondary | ICD-10-CM | POA: Diagnosis not present

## 2018-03-22 DIAGNOSIS — E7849 Other hyperlipidemia: Secondary | ICD-10-CM | POA: Diagnosis not present

## 2018-03-22 DIAGNOSIS — Z7401 Bed confinement status: Secondary | ICD-10-CM | POA: Diagnosis not present

## 2018-03-22 DIAGNOSIS — K219 Gastro-esophageal reflux disease without esophagitis: Secondary | ICD-10-CM | POA: Diagnosis not present

## 2018-03-22 MED ORDER — TRAMADOL HCL 50 MG PO TABS: 50.0000 mg | ORAL_TABLET | Freq: Four times a day (QID) | ORAL | Status: DC | PRN

## 2018-03-22 MED ORDER — TRAMADOL HCL 50 MG PO TABS: 50.0000 mg | ORAL_TABLET | Freq: Four times a day (QID) | ORAL | 0 refills | Status: DC | PRN

## 2018-03-22 NOTE — Care Management Important Message (Signed)
Important Message  Patient Details  Name: Breanna Zhang MRN: [id number] Date of Birth: September 23, 1940   Medicare Important Message Given:  Yes    [name] 03/22/2018, 10:38 AM

## 2018-03-22 NOTE — Progress Notes (Signed)
Patient is medically stable for D/C to [facility] today. [facility] authorization has been received. Per [facility] admissions coordinator at [facility] patient can come today to room 208-B. RN will call report at [phone] and arrange EMS for transport. Clinical Education officer, museum (CSW) sent D/C orders to [facility] via [employer]. Patient is aware of above. Patient's daughter [name] is at bedside and aware of above. Patient has her own cpap at bedside that will go with her to [facility]. Please reconsult if future social work needs arise. CSW signing off.   [employer], LCSW [phone]

## 2018-03-22 NOTE — Clinical Social Work Placement (Signed)
   CLINICAL SOCIAL WORK PLACEMENT  NOTE  Date:  03/22/2018  Patient Details  Name: Breanna Zhang MRN: [id number] Date of Birth: 1940-06-16  Clinical Social Work is seeking post-discharge placement for this patient at the [place] level of care (*CSW will initial, date and re-position this form in  chart as items are completed):  Yes   Patient/family provided with [place] Work Department's list of facilities offering this level of care within the geographic area requested by the patient (or if unable, by the patient's family).  Yes   Patient/family informed of their freedom to choose among providers that offer the needed level of care, that participate in Medicare, Medicaid or managed care program needed by the patient, have an available bed and are willing to accept the patient.  Yes   Patient/family informed of [place]'s ownership interest in [facility] and [facility], as well as of the fact that they are under no obligation to receive care at these facilities.  PASRR submitted to EDS on 03/19/18     PASRR number received on 03/20/18     Existing PASRR number confirmed on       FL2 transmitted to all facilities in geographic area requested by pt/family on 03/19/18     FL2 transmitted to all facilities within larger geographic area on       Patient informed that his/her managed care company has contracts with or will negotiate with certain facilities, including the following:        Yes   Patient/family informed of bed offers received.  Patient chooses bed at [facility] )     Physician recommends and patient chooses bed at      Patient to be transferred to [facility] ) on 03/22/18.  Patient to be transferred to facility by [facility] )     Patient family notified on 03/22/18 of transfer.  Name of family member notified:  (Patient's daughter [name] is aware of D/C today. )     PHYSICIAN        Additional Comment:    _______________________________________________ Khaleel Beckom, Veronia Beets, LCSW 03/22/2018, 11:25 AM

## 2018-03-22 NOTE — Progress Notes (Signed)
Physical Therapy Treatment Patient Details Name: Breanna Zhang MRN: [id number] DOB: [birth year] Today's Date: 03/22/2018    History of Present Illness Pt is a 78 y/o F s/p L THA.  Pt's PMH includes vertigo, cochlear implant, osteoporosis, breast cancer, a-fib.      PT Comments    Breanna Zhang reports feeling overall better today but does report continued hallucinations this morning (seeing people in her room).  RN notified and RN and MD already aware.  Pt continues to require assist with bed mobility, transfers, and ambulation.  Follow up recommendations remain appropriate.     Follow Up Recommendations  SNF     Equipment Recommendations  Rolling walker with 5" wheels    Recommendations for Other Services       Precautions / Restrictions Precautions Precautions: Fall Precaution Comments: direct anterior, no hip precautions Restrictions Weight Bearing Restrictions: Yes LLE Weight Bearing: Weight bearing as tolerated    Mobility  Bed Mobility Overal bed mobility: Needs Assistance Bed Mobility: Supine to Sit     Supine to sit: HOB elevated;Min assist     General bed mobility comments: Assist advancing LLE to EOB but improved compared to prior sessions.    Transfers Overall transfer level: Needs assistance Equipment used: Rolling walker (2 wheeled) Transfers: Sit to/from Stand Sit to Stand: Min assist;+2 physical assistance         General transfer comment: Pt continues to require verbal and tactile cues for proper hand placement with sit>stand transfer.  Pt performs consistent correct safety technique with stand>sit.   Ambulation/Gait Ambulation/Gait assistance: Min assist Gait Distance (Feet): 35 Feet(5, 30) Assistive device: Rolling walker (2 wheeled) Gait Pattern/deviations: Step-to pattern;Decreased step length - right;Decreased weight shift to left;Decreased stance time - left;Antalgic;Trunk flexed Gait velocity: decreased Gait velocity interpretation: <1.31  ft/sec, indicative of household ambulator General Gait Details: Cues for upright posture and forward gaze.  Pt ambulates 5 ft before needing to stop due to feeling hot and nauseous.  BP checked 110/61 in sitting.  Symptoms resolved and pt able to ambulate 30 ft.  SpO2 98% on RA, pulse up to 112.    Stairs             Wheelchair Mobility    Modified Rankin (Stroke Patients Only)       Balance Overall balance assessment: Needs assistance Sitting-balance support: No upper extremity supported;Feet supported Sitting balance-Leahy Scale: Good     Standing balance support: Single extremity supported;During functional activity Standing balance-Leahy Scale: Poor Standing balance comment: Pt relies on BUE support for static and dynamic activities                            Cognition Arousal/Alertness: Awake/alert Behavior During Therapy: WFL for tasks assessed/performed Overall Cognitive Status: Within Functional Limits for tasks assessed                                        Exercises      General Comments General comments (skin integrity, edema, etc.): Pt hallucinating that there were people in her room this morning.  RN notified.        Pertinent Vitals/Pain Pain Assessment: Faces Faces Pain Scale: Hurts even more Pain Location: L hip Pain Descriptors / Indicators: Aching;Grimacing;Guarding Pain Intervention(s): Limited activity within patient's tolerance;Monitored during session;Repositioned;Utilized relaxation techniques    Home Living  Prior Function            PT Goals (current goals can now be found in the care plan section) Acute Rehab PT Goals Patient Stated Goal: to be able to walk PT Goal Formulation: With patient Time For Goal Achievement: 04/02/18 Potential to Achieve Goals: Good Progress towards PT goals: Progressing toward goals    Frequency    BID      PT Plan Current plan remains  appropriate    Co-evaluation              AM-PAC PT "6 Clicks" Mobility   Outcome Measure  Help needed turning from your back to your side while in a flat bed without using bedrails?: A Little Help needed moving from lying on your back to sitting on the side of a flat bed without using bedrails?: A Little Help needed moving to and from a bed to a chair (including a wheelchair)?: A Little Help needed standing up from a chair using your arms (e.g., wheelchair or bedside chair)?: A Little Help needed to walk in hospital room?: A Little Help needed climbing 3-5 steps with a railing? : Total 6 Click Score: 16    End of Session Equipment Utilized During Treatment: Gait belt Activity Tolerance: Patient limited by fatigue Patient left: in chair;with call bell/phone within reach;with chair alarm set;with SCD's reapplied;with family/visitor present Nurse Communication: Mobility status;Other (comment)(BP reading with nausea episode, hallucinations) PT Visit Diagnosis: Pain;Unsteadiness on feet (R26.81);Other abnormalities of gait and mobility (R26.89);Muscle weakness (generalized) (M62.81) Pain - Right/Left: Left Pain - part of body: Hip     Time: 3582-5189 PT Time Calculation (min) (ACUTE ONLY): 48 min  Charges:  $Gait Training: 23-37 mins $Therapeutic Activity: 8-22 mins                     Collie Siad PT, DPT 03/22/2018, 9:55 AM

## 2018-03-22 NOTE — Progress Notes (Signed)
Report given to [place], RN at [facility] called to transport patient to room 208- B; patient updated and family made aware.

## 2018-03-22 NOTE — Progress Notes (Addendum)
   Subjective: 3 Days Post-Op Procedure(s) (LRB): TOTAL HIP ARTHROPLASTY LEFT (Left) Patient reports pain as mild.   Patient is well, and has had no acute complaints or problems Denies any CP, SOB, ABD pain. We will continue therapy today.   Objective: Vital signs in last 24 hours: Temp:  [98.6 F (37 C)-100.1 F (37.8 C)] 99.8 F (37.7 C) (01/10 0757) Pulse Rate:  [97-103] 98 (01/10 0757) Resp:  [18-19] 18 (01/10 0757) BP: (140-153)/(60-67) 140/60 (01/10 0757) SpO2:  [95 %-100 %] 100 % (01/10 0757)  Intake/Output from previous day: 01/09 0701 - 01/10 0700 In: 240 [P.O.:240] Out: 600 [Urine:600] Intake/Output this shift: No intake/output data recorded.  Recent Labs    03/20/18 0455 03/21/18 0432  HGB 10.7* 10.0*   Recent Labs    03/20/18 0455 03/21/18 0432  WBC 8.0 11.4*  RBC 3.26* 3.09*  HCT 32.6* 31.0*  PLT 295 279   Recent Labs    03/20/18 0455 03/21/18 0432  NA 135 135  K 3.6 3.9  CL 104 105  CO2 25 27  BUN 12 13  CREATININE 0.79 0.72  GLUCOSE 131* 145*  CALCIUM 7.8* 7.6*   No results for input(s): LABPT, INR in the last 72 hours.  EXAM General - Patient is Alert, Appropriate and Oriented Extremity - Neurovascular intact Sensation intact distally Intact pulses distally Dorsiflexion/Plantar flexion intact No cellulitis present Compartment soft Dressing - dressing C/D/I and no drainage, provena intact with out drainage Motor Function - intact, moving foot and toes well on exam.   Past Medical History:  Diagnosis Date  . Anxiety   . Atrial fibrillation ([place])   . Breast cancer ([place]) 1999   RT MASTECTOMY  . DDD (degenerative disc disease), cervical   . Depression   . Diverticulosis   . Diverticulosis   . DVT of lower extremity (deep venous thrombosis) ([place])   . Dyspnea   . Family history of colon cancer   . Family history of lung cancer   . Family history of throat cancer   . GERD (gastroesophageal reflux disease)   . Goiter   .  Headache   . History of chemotherapy 2000   BREAST CA  . Hypertension   . Incontinence   . OSA (obstructive sleep apnea)   . Osteoporosis   . Personal history of chemotherapy 1999   BREAST CA  . Status post chemotherapy    2000 right breast cancer  . Thyroid disease   . UTI (urinary tract infection)   . Vertigo     Assessment/Plan:   3 Days Post-Op Procedure(s) (LRB): TOTAL HIP ARTHROPLASTY LEFT (Left) Active Problems:   Status post total hip replacement, left   UTI  Estimated body mass index is 28.5 kg/m as calculated from the following:   Height as of this encounter: 5\' 6"  (1.676 m).   Weight as of this encounter: 80.1 kg. Advance diet Up with therapy  UTI - on cephalexin  Labs and VSS. Afebrile CM to assist with discharge to SNF today  DVT Prophylaxis - Lovenox, TED hose and SCDs Weight-Bearing as tolerated to left leg   T. Rachelle Hora, PA-C [place] 03/22/2018, 8:21 AM

## 2018-03-25 ENCOUNTER — Non-Acute Institutional Stay (SKILLED_NURSING_FACILITY): Payer: PPO | Admitting: Adult Health

## 2018-03-25 ENCOUNTER — Other Ambulatory Visit
Admission: RE | Admit: 2018-03-25 | Discharge: 2018-03-25 | Disposition: A | Discharge status: Home / Self Care | Payer: PPO | Source: Skilled Nursing Facility | Attending: Internal Medicine | Admitting: Internal Medicine

## 2018-03-25 ENCOUNTER — Other Ambulatory Visit: Payer: Self-pay | Admitting: Adult Health

## 2018-03-25 ENCOUNTER — Encounter: Payer: Self-pay | Admitting: Adult Health

## 2018-03-25 DIAGNOSIS — Z96642 Presence of left artificial hip joint: Secondary | ICD-10-CM

## 2018-03-25 DIAGNOSIS — M1612 Unilateral primary osteoarthritis, left hip: Secondary | ICD-10-CM | POA: Diagnosis not present

## 2018-03-25 DIAGNOSIS — E7849 Other hyperlipidemia: Secondary | ICD-10-CM | POA: Diagnosis not present

## 2018-03-25 DIAGNOSIS — M159 Polyosteoarthritis, unspecified: Secondary | ICD-10-CM | POA: Insufficient documentation

## 2018-03-25 DIAGNOSIS — G4733 Obstructive sleep apnea (adult) (pediatric): Secondary | ICD-10-CM

## 2018-03-25 DIAGNOSIS — K219 Gastro-esophageal reflux disease without esophagitis: Secondary | ICD-10-CM | POA: Diagnosis not present

## 2018-03-25 DIAGNOSIS — K5909 Other constipation: Secondary | ICD-10-CM

## 2018-03-25 DIAGNOSIS — C50919 Malignant neoplasm of unspecified site of unspecified female breast: Secondary | ICD-10-CM

## 2018-03-25 DIAGNOSIS — F321 Major depressive disorder, single episode, moderate: Secondary | ICD-10-CM

## 2018-03-25 DIAGNOSIS — M150 Primary generalized (osteo)arthritis: Secondary | ICD-10-CM | POA: Diagnosis not present

## 2018-03-25 DIAGNOSIS — F3342 Major depressive disorder, recurrent, in full remission: Secondary | ICD-10-CM | POA: Diagnosis not present

## 2018-03-25 DIAGNOSIS — N3000 Acute cystitis without hematuria: Secondary | ICD-10-CM | POA: Diagnosis not present

## 2018-03-25 DIAGNOSIS — C3490 Malignant neoplasm of unspecified part of unspecified bronchus or lung: Secondary | ICD-10-CM

## 2018-03-25 DIAGNOSIS — C7802 Secondary malignant neoplasm of left lung: Secondary | ICD-10-CM | POA: Diagnosis not present

## 2018-03-25 DIAGNOSIS — R300 Dysuria: Secondary | ICD-10-CM | POA: Insufficient documentation

## 2018-03-25 DIAGNOSIS — E890 Postprocedural hypothyroidism: Secondary | ICD-10-CM

## 2018-03-25 LAB — URINALYSIS, COMPLETE (UACMP) WITH MICROSCOPIC
Bilirubin Urine: NEGATIVE
Glucose, UA: NEGATIVE mg/dL
Ketones, ur: NEGATIVE mg/dL
Nitrite: NEGATIVE
Protein, ur: 30 mg/dL — AB
SPECIFIC GRAVITY, URINE: 1.024 (ref 1.005–1.030)
WBC, UA: >50 WBC/hpf — ABNORMAL HIGH (ref 0–5)
pH: 6 (ref 5.0–8.0)

## 2018-03-25 MED ORDER — TRAMADOL HCL 50 MG PO TABS: 50.0000 mg | ORAL_TABLET | Freq: Four times a day (QID) | ORAL | 0 refills | Status: DC | PRN

## 2018-03-25 NOTE — Progress Notes (Signed)
Location:   [facility] Room Number: [place] of Service:  SNF (31)   CODE STATUS: Full Code  Allergies  Allergen Reactions  . Imipramine Tinitus  . Latex Rash  . Penicillins Rash  . Tape Rash    Chief Complaint  Patient presents with  . Hospitalization Follow-up    Hospital follow up    HPI:  She is a 78 year old woman who has been hospitalized for a left hip replacement. She is here for short rehab with her goal to return back home. She denies any uncontrolled pain. She would like to take ultram for her pain. She denies any constipation; no nausea or vomiting. No insomnia. She will continue to be followed for her chronic illnesses including: hypothyroidism; hyperlipidemia; gerd.   Past Medical History:  Diagnosis Date  . Anxiety   . Atrial fibrillation ([place])   . Breast cancer ([place]) 1999   RT MASTECTOMY  . DDD (degenerative disc disease), cervical   . Depression   . Diverticulosis   . Diverticulosis   . DVT of lower extremity (deep venous thrombosis) ([place])   . Dyspnea   . Family history of colon cancer   . Family history of lung cancer   . Family history of throat cancer   . GERD (gastroesophageal reflux disease)   . Goiter   . Headache   . History of chemotherapy 2000   BREAST CA  . Hypertension   . Incontinence   . OSA (obstructive sleep apnea)   . Osteoporosis   . Personal history of chemotherapy 1999   BREAST CA  . Status post chemotherapy    2000 right breast cancer  . Thyroid disease   . UTI (urinary tract infection)   . Vertigo     Past Surgical History:  Procedure Laterality Date  . ABDOMINAL HYSTERECTOMY    . BREAST BIOPSY Left 2006   negative  . BUNIONECTOMY Bilateral    Screws implanted.  . CHOLECYSTECTOMY    . COCHLEAR IMPLANT Right   . COLONOSCOPY WITH PROPOFOL N/A 07/24/2016   Procedure: COLONOSCOPY WITH PROPOFOL;  Surgeon: Manya Silvas, MD;  Location: [facility] ENDOSCOPY;  Service: Endoscopy;   Laterality: N/A;  . ESOPHAGOGASTRODUODENOSCOPY (EGD) WITH PROPOFOL N/A 04/12/2016   Procedure: ESOPHAGOGASTRODUODENOSCOPY (EGD) WITH PROPOFOL;  Surgeon: Manya Silvas, MD;  Location: [facility] ENDOSCOPY;  Service: Endoscopy;  Laterality: N/A;  . EYE SURGERY Bilateral    cataract  . FOOT SURGERY Bilateral   . HAND SURGERY Right   . MASTECTOMY Right 1999   BREAST CA  . MASTECTOMY Right 1999  . PARTIAL COLECTOMY    . THYROIDECTOMY    . TOTAL HIP ARTHROPLASTY Left 03/19/2018   Procedure: TOTAL HIP ARTHROPLASTY LEFT;  Surgeon: Hessie Knows, MD;  Location: [facility] ORS;  Service: Orthopedics;  Laterality: Left;  Marland Kitchen VIDEO ASSISTED THORACOSCOPY Left 09/10/2017   Procedure: VIDEO ASSISTED THORACOSCOPY;  Surgeon: [name], MD;  Location: [facility] ORS;  Service: Thoracic;  Laterality: Left;  with biopsies  . VIDEO BRONCHOSCOPY  09/10/2017   Procedure: VIDEO BRONCHOSCOPY;  Surgeon: [name], MD;  Location: [facility] ORS;  Service: Thoracic;;    Social History   Socioeconomic History  . Marital status: Married    Spouse name: evert  . Number of children: 4  . Years of education: Not on file  . Highest education level: 9th grade  Occupational History  . Not on file  Social Needs  . Financial resource strain: Somewhat  hard  . Food insecurity:    Worry: Often true    Inability: Often true  . Transportation needs:    Medical: No    Non-medical: No  Tobacco Use  . Smoking status: Never Smoker  . Smokeless tobacco: Never Used  Substance and Sexual Activity  . Alcohol use: No    Alcohol/week: 0.0 standard drinks  . Drug use: No  . Sexual activity: Not Currently  Lifestyle  . Physical activity:    Days per week: 0 days    Minutes per session: 0 min  . Stress: Not at all  Relationships  . Social connections:    Talks on phone: Not on file    Gets together: Not on file    Attends religious service: More than 4 times per year    Active member of club or organization: No    Attends meetings of  clubs or organizations: Never    Relationship status: Married  . Intimate partner violence:    Fear of current or ex partner: No    Emotionally abused: No    Physically abused: No    Forced sexual activity: No  Other Topics Concern  . Not on file  Social History Narrative  . Not on file   Family History  Problem Relation Age of Onset  . Dementia Mother   . Dementia Sister   . Diabetes Sister   . Heart attack Brother 28  . COPD Brother   . Lung cancer Brother        hx smoking  . Colon cancer Brother   . Liver cancer Brother   . COPD Brother   . Lung cancer Brother        hx smoking  . Colon cancer Maternal Grandfather        dx >.50  . Breast cancer Neg Hx       VITAL SIGNS BP 111/68   Pulse 91   Temp 99.9 F (37.7 C)   Resp 18   Ht 5\' 6"  (1.676 m)   Wt 175 lb 11.2 oz (79.7 kg)   SpO2 99%   BMI 28.36 kg/m   Outpatient Encounter Medications as of 03/25/2018  Medication Sig  . atorvastatin (LIPITOR) 40 MG tablet Take 40 mg by mouth at bedtime.  . [EXPIRED] cephALEXin (KEFLEX) 500 MG capsule Take 1 capsule (500 mg total) by mouth every 6 (six) hours for 5 days.  . cholecalciferol (VITAMIN D) 1000 units tablet Take 1,000 Units by mouth daily.  . cyanocobalamin (V-R VITAMIN B-12) 500 MCG tablet Take 500 mcg by mouth daily.   . DULoxetine (CYMBALTA) 60 MG capsule Take 1 capsule (60 mg total) by mouth every morning.  . enoxaparin (LOVENOX) 30 MG/0.3ML injection Inject 0.3 mLs (30 mg total) into the skin every 12 (twelve) hours for 14 days.  Marland Kitchen letrozole (FEMARA) 2.5 MG tablet TAKE 1 TABLET BY MOUTH EVERY DAY  . NON FORMULARY Diet Type: Regular  . ondansetron (ZOFRAN) 4 MG tablet Take 1 tablet (4 mg total) by mouth every 6 (six) hours as needed for nausea or vomiting.  . palbociclib (IBRANCE) 100 MG capsule Take 100 mg by mouth daily with breakfast. Take whole with food. Take for 21 days on, 7 days off, repeat every 28 days.  . pantoprazole (PROTONIX) 40 MG tablet Take  40 mg by mouth 2 (two) times daily.   Marland Kitchen senna (SENOKOT) 8.6 MG TABS tablet Take 2 tablets by mouth 2 (two) times daily.  Marland Kitchen  SYNTHROID 88 MCG tablet Take 88 mcg by mouth daily before breakfast.   . traZODone (DESYREL) 100 MG tablet Take 100 mg by mouth at bedtime.   No facility-administered encounter medications on file as of 03/25/2018.      SIGNIFICANT DIAGNOSTIC EXAMS  TODAY;   03-21-18: chest x-ray: Poor inspiration. Small effusions in the posterior costophrenic [place].  LABS REVIEWED TODAY:   03-21-18: wbc 11.4; hgb 10.0; hct 31.90; mcv 100.3; k+ 279; glucose 145;bun 13; creat 0.72; k+ 3.9; na++ 135; ca 7.6    Review of Systems  Constitutional: Negative for malaise/fatigue.  Respiratory: Negative for cough and shortness of breath.   Cardiovascular: Negative for chest pain, palpitations and leg swelling.  Gastrointestinal: Negative for abdominal pain, constipation and heartburn.  Musculoskeletal: Negative for back pain, joint pain and myalgias.  Skin: Negative.   Neurological: Negative for dizziness.  Psychiatric/Behavioral: The patient is not nervous/anxious.     Physical Exam Constitutional:      General: She is not in acute distress.    Appearance: She is well-developed. She is not diaphoretic.  HENT:     Head:     Comments: History of right cochlear implant  Neck:     Thyroid: No thyromegaly.  Cardiovascular:     Rate and Rhythm: Normal rate and regular rhythm.     Heart sounds: Normal heart sounds.  Pulmonary:     Effort: Pulmonary effort is normal. No respiratory distress.     Breath sounds: Normal breath sounds.  Abdominal:     General: Bowel sounds are normal. There is no distension.     Palpations: Abdomen is soft.     Tenderness: There is no abdominal tenderness.  Musculoskeletal:     Right lower leg: No edema.     Left lower leg: No edema.     Comments: Is able to move all extremities Is status post left hip replacement   Lymphadenopathy:     Cervical:  No cervical adenopathy.  Skin:    General: Skin is warm and dry.     Comments: History of right breath mastectomy 1995 Incision line without signs of infection present.   Neurological:     Mental Status: She is alert and oriented to person, place, and time.  Psychiatric:        Mood and Affect: Mood normal.       ASSESSMENT/ PLAN:  TODAY:   1. Post operative hypothyroidism: is stable will continue synthroid 88 mcg daily   2. Hyperlipidemia: is stable will continue lipitor 40 mg daily   3. Metastatic breast cancer/lung cancer: is without change: is followed by oncology: will continue inbrance 100 mg daily as directed will continue femera 2.5 mg daily   4. GERD without esophagitis: is stable will continue protonix 40 mg twice daily   5. Chronic constipation: is stable will continue senna 2 tabs twice daily   6. Obstructive sleep apnea: stable using cpap  7. Primary osteoarthritis of left hip: is status post left hip replacement: will continue therapy as directed; will follow up with orthopedics. Will continue ultram 50 mg every 6 hours as needed will continue lovenox 30 mg daily for 14 days.   8.  Depression major single episode moderate: is stable will continue cymbalta 60 mg daily and trazodone 100 mg nightly   Will check cbc 04-01-18       MD is aware of resident's narcotic use and is in agreement with current plan of care. We will attempt to  wean resident as apropriate   Ok Edwards NP [facility]  Contact [phone] Monday through Friday 8am- 5pm  After hours call [phone]

## 2018-03-26 ENCOUNTER — Encounter: Payer: Self-pay | Admitting: Adult Health

## 2018-03-26 ENCOUNTER — Non-Acute Institutional Stay (SKILLED_NURSING_FACILITY): Payer: PPO | Admitting: Adult Health

## 2018-03-26 DIAGNOSIS — Z96642 Presence of left artificial hip joint: Secondary | ICD-10-CM | POA: Diagnosis not present

## 2018-03-26 DIAGNOSIS — C50912 Malignant neoplasm of unspecified site of left female breast: Secondary | ICD-10-CM

## 2018-03-26 DIAGNOSIS — M1612 Unilateral primary osteoarthritis, left hip: Secondary | ICD-10-CM

## 2018-03-26 NOTE — Progress Notes (Signed)
[facility]   [facility] Room Number: [place] of Service:  SNF (31)   CODE STATUS: Full Code  Allergies  Allergen Reactions  . Imipramine Tinitus  . Latex Rash  . Penicillins Rash  . Tape Rash    Chief Complaint  Patient presents with  . Acute Visit    Care Plan Meeting    HPI:  We have come together for her routine care plan meeting. She does have family present. She doe have a poor appetite. We have discussed her therapy; she did loose her balance today in the bathroom with therapy. She denies any uncontrolled pain. Her goal continues to be for her return back home. She will need a front wheel walker and a 3:1 commode    Past Medical History:  Diagnosis Date  . Anxiety   . Atrial fibrillation ([place])   . Breast cancer ([place]) 1999   RT MASTECTOMY  . DDD (degenerative disc disease), cervical   . Depression   . Diverticulosis   . Diverticulosis   . DVT of lower extremity (deep venous thrombosis) ([place])   . Dyspnea   . Family history of colon cancer   . Family history of lung cancer   . Family history of throat cancer   . GERD (gastroesophageal reflux disease)   . Goiter   . Headache   . History of chemotherapy 2000   BREAST CA  . Hypertension   . Incontinence   . OSA (obstructive sleep apnea)   . Osteoporosis   . Personal history of chemotherapy 1999   BREAST CA  . Status post chemotherapy    2000 right breast cancer  . Thyroid disease   . UTI (urinary tract infection)   . Vertigo     Past Surgical History:  Procedure Laterality Date  . ABDOMINAL HYSTERECTOMY    . BREAST BIOPSY Left 2006   negative  . BUNIONECTOMY Bilateral    Screws implanted.  . CHOLECYSTECTOMY    . COCHLEAR IMPLANT Right   . COLONOSCOPY WITH PROPOFOL N/A 07/24/2016   Procedure: COLONOSCOPY WITH PROPOFOL;  Surgeon: Manya Silvas, MD;  Location: [facility] ENDOSCOPY;  Service: Endoscopy;  Laterality: N/A;  . ESOPHAGOGASTRODUODENOSCOPY (EGD) WITH PROPOFOL  N/A 04/12/2016   Procedure: ESOPHAGOGASTRODUODENOSCOPY (EGD) WITH PROPOFOL;  Surgeon: Manya Silvas, MD;  Location: [facility] ENDOSCOPY;  Service: Endoscopy;  Laterality: N/A;  . EYE SURGERY Bilateral    cataract  . FOOT SURGERY Bilateral   . HAND SURGERY Right   . MASTECTOMY Right 1999   BREAST CA  . MASTECTOMY Right 1999  . PARTIAL COLECTOMY    . THYROIDECTOMY    . TOTAL HIP ARTHROPLASTY Left 03/19/2018   Procedure: TOTAL HIP ARTHROPLASTY LEFT;  Surgeon: Hessie Knows, MD;  Location: [facility] ORS;  Service: Orthopedics;  Laterality: Left;  Marland Kitchen VIDEO ASSISTED THORACOSCOPY Left 09/10/2017   Procedure: VIDEO ASSISTED THORACOSCOPY;  Surgeon: [name], MD;  Location: [facility] ORS;  Service: Thoracic;  Laterality: Left;  with biopsies  . VIDEO BRONCHOSCOPY  09/10/2017   Procedure: VIDEO BRONCHOSCOPY;  Surgeon: [name], MD;  Location: [facility] ORS;  Service: Thoracic;;    Social History   Socioeconomic History  . Marital status: Married    Spouse name: evert  . Number of children: 4  . Years of education: Not on file  . Highest education level: 9th grade  Occupational History  . Not on file  Social Needs  . Financial resource strain: Somewhat hard  .  Food insecurity:    Worry: Often true    Inability: Often true  . Transportation needs:    Medical: No    Non-medical: No  Tobacco Use  . Smoking status: Never Smoker  . Smokeless tobacco: Never Used  Substance and Sexual Activity  . Alcohol use: No    Alcohol/week: 0.0 standard drinks  . Drug use: No  . Sexual activity: Not Currently  Lifestyle  . Physical activity:    Days per week: 0 days    Minutes per session: 0 min  . Stress: Not at all  Relationships  . Social connections:    Talks on phone: Not on file    Gets together: Not on file    Attends religious service: More than 4 times per year    Active member of club or organization: No    Attends meetings of clubs or organizations: Never    Relationship status: Married  .  Intimate partner violence:    Fear of current or ex partner: No    Emotionally abused: No    Physically abused: No    Forced sexual activity: No  Other Topics Concern  . Not on file  Social History Narrative  . Not on file   Family History  Problem Relation Age of Onset  . Dementia Mother   . Dementia Sister   . Diabetes Sister   . Heart attack Brother 16  . COPD Brother   . Lung cancer Brother        hx smoking  . Colon cancer Brother   . Liver cancer Brother   . COPD Brother   . Lung cancer Brother        hx smoking  . Colon cancer Maternal Grandfather        dx >.50  . Breast cancer Neg Hx       VITAL SIGNS BP (!) 118/55   Pulse 90   Temp 99.7 F (37.6 C)   Resp 18   Ht 5\' 6"  (1.676 m)   Wt 174 lb 6.4 oz (79.1 kg)   SpO2 100%   BMI 28.15 kg/m   Outpatient Encounter Medications as of 03/26/2018  Medication Sig  . atorvastatin (LIPITOR) 40 MG tablet Take 40 mg by mouth at bedtime.  . cephALEXin (KEFLEX) 500 MG capsule Take 1 capsule (500 mg total) by mouth every 6 (six) hours for 5 days.  . cholecalciferol (VITAMIN D) 1000 units tablet Take 1,000 Units by mouth daily.  . cyanocobalamin (V-R VITAMIN B-12) 500 MCG tablet Take 500 mcg by mouth daily.   . diazepam (VALIUM) 5 MG tablet Take 5 mg by mouth every 12 (twelve) hours as needed for anxiety.  . DULoxetine (CYMBALTA) 60 MG capsule Take 1 capsule (60 mg total) by mouth every morning.  . enoxaparin (LOVENOX) 30 MG/0.3ML injection Inject 0.3 mLs (30 mg total) into the skin every 12 (twelve) hours for 14 days.  Marland Kitchen letrozole (FEMARA) 2.5 MG tablet TAKE 1 TABLET BY MOUTH EVERY DAY  . NON FORMULARY Diet Type: Regular  . ondansetron (ZOFRAN) 4 MG tablet Take 1 tablet (4 mg total) by mouth every 6 (six) hours as needed for nausea or vomiting.  . palbociclib (IBRANCE) 100 MG capsule Take 100 mg by mouth daily with breakfast. Take whole with food. Take for 21 days on, 7 days off, repeat every 28 days.  . pantoprazole  (PROTONIX) 40 MG tablet Take 40 mg by mouth 2 (two) times daily.   Marland Kitchen  senna (SENOKOT) 8.6 MG TABS tablet Take 2 tablets by mouth 2 (two) times daily.  Marland Kitchen SYNTHROID 88 MCG tablet Take 88 mcg by mouth daily before breakfast.   . traMADol (ULTRAM) 50 MG tablet Take 1 tablet (50 mg total) by mouth every 6 (six) hours as needed for severe pain.  . traZODone (DESYREL) 100 MG tablet Take 100 mg by mouth at bedtime.   No facility-administered encounter medications on file as of 03/26/2018.      SIGNIFICANT DIAGNOSTIC EXAMS  PREVIOUS;   03-21-18: chest x-ray: Poor inspiration. Small effusions in the posterior costophrenic Angles.  LABS REVIEWED PREVIOUS:   03-21-18: wbc 11.4; hgb 10.0; hct 31.90; mcv 100.3; k+ 279; glucose 145;bun 13; creat 0.72; k+ 3.9; na++ 135; ca 7.6   NO NEW LABS.    Review of Systems  Constitutional: Negative for malaise/fatigue.  Respiratory: Negative for cough and shortness of breath.   Cardiovascular: Negative for chest pain, palpitations and leg swelling.  Gastrointestinal: Negative for abdominal pain, constipation and heartburn.  Musculoskeletal: Negative for back pain, joint pain and myalgias.  Skin: Negative.   Neurological: Negative for dizziness.  Psychiatric/Behavioral: The patient is not nervous/anxious.     Physical Exam Constitutional:      General: She is not in acute distress.    Appearance: She is well-developed. She is not diaphoretic.  HENT:     Head:     Comments: History of right cochlear implant  Neck:     Thyroid: No thyromegaly.  Cardiovascular:     Rate and Rhythm: Normal rate and regular rhythm.     Pulses: Normal pulses.     Heart sounds: Normal heart sounds.  Pulmonary:     Effort: Pulmonary effort is normal. No respiratory distress.     Breath sounds: Normal breath sounds.  Abdominal:     General: Bowel sounds are normal. There is no distension.     Palpations: Abdomen is soft.     Tenderness: There is no abdominal tenderness.    Musculoskeletal:     Right lower leg: No edema.     Left lower leg: No edema.     Comments: Is able to move all extremities Is status post left hip replacement    Lymphadenopathy:     Cervical: No cervical adenopathy.  Skin:    General: Skin is warm and dry.     Comments: History of right breath mastectomy 1995 Incision line without signs of infection present.    Neurological:     Mental Status: She is alert and oriented to person, place, and time.  Psychiatric:        Mood and Affect: Mood normal.      ASSESSMENT/ PLAN:  TODAY:   1. Primary osteoarthritis of left hip 2. Status post left hip replacement.  3. Metastatic breath cancer 4. Lung cancer  Will continue current plan of care Will continue current medications Will need a front wheel walker and 3:1 commode upon discharge      MD is aware of resident's narcotic use and is in agreement with current plan of care. We will attempt to wean resident as apropriate   Ok Edwards NP [facility]  Contact [phone] Monday through Friday 8am- 5pm  After hours call [phone]

## 2018-03-27 ENCOUNTER — Other Ambulatory Visit: Payer: Self-pay | Admitting: Adult Health

## 2018-03-27 MED ORDER — DIAZEPAM 5 MG PO TABS: 5.0000 mg | ORAL_TABLET | Freq: Two times a day (BID) | ORAL | 0 refills | Status: DC | PRN

## 2018-03-27 MED ORDER — HYDROCODONE-ACETAMINOPHEN 5-325 MG PO TABS: 1.0000 | ORAL_TABLET | Freq: Four times a day (QID) | ORAL | 0 refills | Status: DC | PRN

## 2018-03-28 DIAGNOSIS — M1612 Unilateral primary osteoarthritis, left hip: Secondary | ICD-10-CM | POA: Insufficient documentation

## 2018-03-28 DIAGNOSIS — E890 Postprocedural hypothyroidism: Secondary | ICD-10-CM | POA: Insufficient documentation

## 2018-03-28 DIAGNOSIS — F321 Major depressive disorder, single episode, moderate: Secondary | ICD-10-CM | POA: Insufficient documentation

## 2018-03-28 DIAGNOSIS — K5909 Other constipation: Secondary | ICD-10-CM | POA: Insufficient documentation

## 2018-03-28 LAB — URINE CULTURE: Culture: >=100000 — AB

## 2018-04-01 ENCOUNTER — Other Ambulatory Visit
Admission: RE | Admit: 2018-04-01 | Discharge: 2018-04-01 | Disposition: A | Discharge status: Home / Self Care | Payer: PPO | Source: Ambulatory Visit | Attending: Adult Health | Admitting: Adult Health

## 2018-04-01 ENCOUNTER — Encounter: Payer: Self-pay | Admitting: Adult Health

## 2018-04-01 ENCOUNTER — Other Ambulatory Visit: Payer: Self-pay | Admitting: Adult Health

## 2018-04-01 ENCOUNTER — Other Ambulatory Visit: Payer: PPO

## 2018-04-01 ENCOUNTER — Ambulatory Visit: Payer: PPO | Admitting: Oncology

## 2018-04-01 ENCOUNTER — Non-Acute Institutional Stay (SKILLED_NURSING_FACILITY): Payer: PPO | Admitting: Adult Health

## 2018-04-01 DIAGNOSIS — D649 Anemia, unspecified: Secondary | ICD-10-CM | POA: Insufficient documentation

## 2018-04-01 DIAGNOSIS — M1612 Unilateral primary osteoarthritis, left hip: Secondary | ICD-10-CM | POA: Diagnosis not present

## 2018-04-01 DIAGNOSIS — Z96642 Presence of left artificial hip joint: Secondary | ICD-10-CM | POA: Diagnosis not present

## 2018-04-01 DIAGNOSIS — I10 Essential (primary) hypertension: Secondary | ICD-10-CM | POA: Diagnosis not present

## 2018-04-01 DIAGNOSIS — E890 Postprocedural hypothyroidism: Secondary | ICD-10-CM

## 2018-04-01 LAB — CBC WITH DIFFERENTIAL/PLATELET
ABS IMMATURE GRANULOCYTES: 0.03 10*3/uL (ref 0.00–0.07)
Basophils Absolute: 0.1 10*3/uL (ref 0.0–0.1)
Basophils Relative: 1 %
Eosinophils Absolute: 0.3 10*3/uL (ref 0.0–0.5)
Eosinophils Relative: 4 %
HCT: 29.4 % — ABNORMAL LOW (ref 36.0–46.0)
HEMOGLOBIN: 9.3 g/dL — AB (ref 12.0–15.0)
Immature Granulocytes: 0 %
Lymphocytes Relative: 24 %
Lymphs Abs: 1.9 10*3/uL (ref 0.7–4.0)
MCH: 31.3 pg (ref 26.0–34.0)
MCHC: 31.6 g/dL (ref 30.0–36.0)
MCV: 99 fL (ref 80.0–100.0)
MONO ABS: 0.5 10*3/uL (ref 0.1–1.0)
Monocytes Relative: 6 %
NEUTROS ABS: 5 10*3/uL (ref 1.7–7.7)
Neutrophils Relative %: 65 %
Platelets: 429 10*3/uL — ABNORMAL HIGH (ref 150–400)
RBC: 2.97 MIL/uL — AB (ref 3.87–5.11)
RDW: 13.2 % (ref 11.5–15.5)
WBC: 7.7 10*3/uL (ref 4.0–10.5)
nRBC: 0 % (ref 0.0–0.2)

## 2018-04-01 MED ORDER — ENOXAPARIN SODIUM 30 MG/0.3ML ~~LOC~~ SOLN: 30.0000 mg | Freq: Two times a day (BID) | SUBCUTANEOUS | 0 refills | Status: DC

## 2018-04-01 MED ORDER — DIAZEPAM 5 MG PO TABS: 5.0000 mg | ORAL_TABLET | Freq: Two times a day (BID) | ORAL | 0 refills | Status: AC | PRN

## 2018-04-01 MED ORDER — SYNTHROID 88 MCG PO TABS: 88.0000 ug | ORAL_TABLET | Freq: Every day | ORAL | 0 refills | Status: DC

## 2018-04-01 MED ORDER — ONDANSETRON HCL 4 MG PO TABS: 4.0000 mg | ORAL_TABLET | Freq: Four times a day (QID) | ORAL | 0 refills | Status: DC | PRN

## 2018-04-01 MED ORDER — LETROZOLE 2.5 MG PO TABS: 2.5000 mg | ORAL_TABLET | Freq: Every day | ORAL | 0 refills | Status: DC

## 2018-04-01 MED ORDER — DULOXETINE HCL 60 MG PO CPEP: 60.0000 mg | ORAL_CAPSULE | Freq: Every morning | ORAL | 0 refills | Status: DC

## 2018-04-01 MED ORDER — PANTOPRAZOLE SODIUM 40 MG PO TBEC: 40.0000 mg | DELAYED_RELEASE_TABLET | Freq: Two times a day (BID) | ORAL | 0 refills | Status: AC

## 2018-04-01 MED ORDER — TRAZODONE HCL 100 MG PO TABS: 100.0000 mg | ORAL_TABLET | Freq: Every day | ORAL | 0 refills | Status: DC

## 2018-04-01 MED ORDER — ATORVASTATIN CALCIUM 40 MG PO TABS: 40.0000 mg | ORAL_TABLET | Freq: Every day | ORAL | 0 refills | Status: AC

## 2018-04-01 MED ORDER — TRAMADOL HCL 50 MG PO TABS: 50.0000 mg | ORAL_TABLET | Freq: Four times a day (QID) | ORAL | 0 refills | Status: DC | PRN

## 2018-04-01 NOTE — Progress Notes (Signed)
[facility]   [facility] Room Number: [place] of Service:  SNF (31)    CODE STATUS:   Allergies  Allergen Reactions  . Imipramine Tinitus  . Latex Rash  . Penicillins Rash  . Tape Rash    Chief Complaint  Patient presents with  . Discharge Note    Discharging to home on 04/03/18    HPI:  She is being discharged to home with home health for pt/ot. She will need a front wheel walker and 3;1 commode. She will need her prescriptions written and will need to follow up with her medical provider. She had been hospitalized for a left hip replacement. She was admitted to this facility for short term rehab. She has completed therapy at the skilled nursing level and is ready for discharge to home.    Past Medical History:  Diagnosis Date  . Anxiety   . Atrial fibrillation ([place])   . Breast cancer ([place]) 1999   RT MASTECTOMY  . DDD (degenerative disc disease), cervical   . Depression   . Diverticulosis   . Diverticulosis   . DVT of lower extremity (deep venous thrombosis) ([place])   . Dyspnea   . Family history of colon cancer   . Family history of lung cancer   . Family history of throat cancer   . GERD (gastroesophageal reflux disease)   . Goiter   . Headache   . History of chemotherapy 2000   BREAST CA  . Hypertension   . Incontinence   . OSA (obstructive sleep apnea)   . Osteoporosis   . Personal history of chemotherapy 1999   BREAST CA  . Status post chemotherapy    2000 right breast cancer  . Thyroid disease   . UTI (urinary tract infection)   . Vertigo     Past Surgical History:  Procedure Laterality Date  . ABDOMINAL HYSTERECTOMY    . BREAST BIOPSY Left 2006   negative  . BUNIONECTOMY Bilateral    Screws implanted.  . CHOLECYSTECTOMY    . COCHLEAR IMPLANT Right   . COLONOSCOPY WITH PROPOFOL N/A 07/24/2016   Procedure: COLONOSCOPY WITH PROPOFOL;  Surgeon: Manya Silvas, MD;  Location: [facility] ENDOSCOPY;  Service: Endoscopy;   Laterality: N/A;  . ESOPHAGOGASTRODUODENOSCOPY (EGD) WITH PROPOFOL N/A 04/12/2016   Procedure: ESOPHAGOGASTRODUODENOSCOPY (EGD) WITH PROPOFOL;  Surgeon: Manya Silvas, MD;  Location: [facility] ENDOSCOPY;  Service: Endoscopy;  Laterality: N/A;  . EYE SURGERY Bilateral    cataract  . FOOT SURGERY Bilateral   . HAND SURGERY Right   . MASTECTOMY Right 1999   BREAST CA  . MASTECTOMY Right 1999  . PARTIAL COLECTOMY    . THYROIDECTOMY    . TOTAL HIP ARTHROPLASTY Left 03/19/2018   Procedure: TOTAL HIP ARTHROPLASTY LEFT;  Surgeon: Hessie Knows, MD;  Location: [facility] ORS;  Service: Orthopedics;  Laterality: Left;  Marland Kitchen VIDEO ASSISTED THORACOSCOPY Left 09/10/2017   Procedure: VIDEO ASSISTED THORACOSCOPY;  Surgeon: [name], MD;  Location: [facility] ORS;  Service: Thoracic;  Laterality: Left;  with biopsies  . VIDEO BRONCHOSCOPY  09/10/2017   Procedure: VIDEO BRONCHOSCOPY;  Surgeon: [name], MD;  Location: [facility] ORS;  Service: Thoracic;;    Social History   Socioeconomic History  . Marital status: Married    Spouse name: evert  . Number of children: 4  . Years of education: Not on file  . Highest education level: 9th grade  Occupational History  . Not on file  Social Needs  . Financial resource strain: Somewhat hard  . Food insecurity:    Worry: Often true    Inability: Often true  . Transportation needs:    Medical: No    Non-medical: No  Tobacco Use  . Smoking status: Never Smoker  . Smokeless tobacco: Never Used  Substance and Sexual Activity  . Alcohol use: No    Alcohol/week: 0.0 standard drinks  . Drug use: No  . Sexual activity: Not Currently  Lifestyle  . Physical activity:    Days per week: 0 days    Minutes per session: 0 min  . Stress: Not at all  Relationships  . Social connections:    Talks on phone: Not on file    Gets together: Not on file    Attends religious service: More than 4 times per year    Active member of club or organization: No    Attends meetings of  clubs or organizations: Never    Relationship status: Married  . Intimate partner violence:    Fear of current or ex partner: No    Emotionally abused: No    Physically abused: No    Forced sexual activity: No  Other Topics Concern  . Not on file  Social History Narrative  . Not on file   Family History  Problem Relation Age of Onset  . Dementia Mother   . Dementia Sister   . Diabetes Sister   . Heart attack Brother 25  . COPD Brother   . Lung cancer Brother        hx smoking  . Colon cancer Brother   . Liver cancer Brother   . COPD Brother   . Lung cancer Brother        hx smoking  . Colon cancer Maternal Grandfather        dx >.50  . Breast cancer Neg Hx     VITAL SIGNS BP (!) 134/58   Pulse 82   Temp 99.4 F (37.4 C)   Resp 18   Ht 5\' 6"  (1.676 m)   Wt 174 lb 6.4 oz (79.1 kg)   SpO2 100%   BMI 28.15 kg/m   Patient's Medications  New Prescriptions   No medications on file  Previous Medications   ACETAMINOPHEN (TYLENOL) 325 MG TABLET    Take 650 mg by mouth every 4 (four) hours as needed. For pain / increased temp.  May be administered orally,  per G-Tube if needed or rectally if unable to swallow (separate order).  Maximum dose for 24 hours is 3,000 mg from all sources of Acetaminophen / Tylenol   ATORVASTATIN (LIPITOR) 40 MG TABLET    Take 40 mg by mouth at bedtime.   CHOLECALCIFEROL (VITAMIN D) 1000 UNITS TABLET    Take 1,000 Units by mouth daily.   CYANOCOBALAMIN (V-R VITAMIN B-12) 500 MCG TABLET    Take 500 mcg by mouth daily.    DIAZEPAM (VALIUM) 5 MG TABLET    Take 1 tablet (5 mg total) by mouth every 12 (twelve) hours as needed for up to 14 days for anxiety.   DULOXETINE (CYMBALTA) 60 MG CAPSULE    Take 1 capsule (60 mg total) by mouth every morning.   ENOXAPARIN (LOVENOX) 30 MG/0.3ML INJECTION    Inject 0.3 mLs (30 mg total) into the skin every 12 (twelve) hours for 14 days.   INFANT CARE PRODUCTS (DERMACLOUD EX)    Apply liberal amount topically to  area of skin  irritation as needed.  OK to leave at bedside.   LETROZOLE (FEMARA) 2.5 MG TABLET    TAKE 1 TABLET BY MOUTH EVERY DAY   LOPERAMIDE (IMODIUM A-D) 2 MG TABLET    Give 2 tablets (4 mg) with first loose stool, then 1 tablet (2 mg) with each subsequent loose stool up to 8 doses in 24 hours   NON FORMULARY    Diet Type: Regular   ONDANSETRON (ZOFRAN) 4 MG TABLET    Take 1 tablet (4 mg total) by mouth every 6 (six) hours as needed for nausea or vomiting.   PALBOCICLIB (IBRANCE) 100 MG CAPSULE    Take 100 mg by mouth daily with breakfast. Take whole with food. Take for 21 days on, 7 days off, repeat every 28 days.   PANTOPRAZOLE (PROTONIX) 40 MG TABLET    Take 40 mg by mouth 2 (two) times daily.    POLYETHYLENE GLYCOL (MIRALAX / GLYCOLAX) PACKET    Take 17 g by mouth at bedtime.   SENNA (SENOKOT) 8.6 MG TABS TABLET    Take 2 tablets by mouth 2 (two) times daily.   SYNTHROID 88 MCG TABLET    Take 88 mcg by mouth daily before breakfast.    TRAMADOL (ULTRAM) 50 MG TABLET    Take 1 tablet (50 mg total) by mouth every 6 (six) hours as needed for severe pain.   TRAZODONE (DESYREL) 100 MG TABLET    Take 100 mg by mouth at bedtime.  Modified Medications   No medications on file  Discontinued Medications   HYDROCODONE-ACETAMINOPHEN (NORCO/VICODIN) 5-325 MG TABLET    Take 1-2 tablets by mouth every 6 (six) hours as needed for moderate pain or severe pain (1 for 5-7/10 and 2 for 8-10/10).     SIGNIFICANT DIAGNOSTIC EXAMS  PREVIOUS;   03-21-18: chest x-ray: Poor inspiration. Small effusions in the posterior costophrenic Angles.  LABS REVIEWED PREVIOUS:   03-21-18: wbc 11.4; hgb 10.0; hct 31.90; mcv 100.3; k+ 279; glucose 145;bun 13; creat 0.72; k+ 3.9; na++ 135; ca 7.6   NO NEW LABS.    Review of Systems  Constitutional: Negative for malaise/fatigue.  Respiratory: Negative for cough and shortness of breath.   Cardiovascular: Negative for chest pain, palpitations and leg swelling.    Gastrointestinal: Negative for abdominal pain, constipation and heartburn.  Musculoskeletal: Negative for back pain, joint pain and myalgias.  Skin: Negative.   Neurological: Negative for dizziness.  Psychiatric/Behavioral: The patient is not nervous/anxious.     Physical Exam Constitutional:      General: She is not in acute distress.    Appearance: She is well-developed. She is not diaphoretic.  HENT:     Head:     Comments:  History of right cochlear implant  Neck:     Musculoskeletal: Neck supple.     Thyroid: No thyromegaly.  Cardiovascular:     Rate and Rhythm: Normal rate and regular rhythm.     Pulses: Normal pulses.     Heart sounds: Normal heart sounds.  Pulmonary:     Effort: Pulmonary effort is normal. No respiratory distress.     Breath sounds: Normal breath sounds.  Abdominal:     General: Bowel sounds are normal. There is no distension.     Palpations: Abdomen is soft.     Tenderness: There is no abdominal tenderness.  Musculoskeletal:     Right lower leg: No edema.     Left lower leg: No edema.     Comments:  Is able to move all extremities Is status post left hip replacement     Lymphadenopathy:     Cervical: No cervical adenopathy.  Skin:    General: Skin is warm and dry.     Comments:  History of right breath mastectomy 1995 Incision line without signs of infection present.     Neurological:     Mental Status: She is alert and oriented to person, place, and time.     ASSESSMENT/ PLAN:   Patient is being discharged with the following home health services:  Pt/ot to evaluate and treat as indicated for gait balance strength adl training   Patient is being discharged with the following durable medical equipment:  Front wheel walker to allow her to maintain her current level of independence with her adls. Will need 3:1 commode   Patient has been advised to f/u with their PCP in 1-2 weeks to bring them up to date on their rehab stay.  Social services  at facility was responsible for arranging this appointment.  Pt was provided with a 30 day supply of prescriptions for medications and refills must be obtained from their PCP.  For controlled substances, a more limited supply may be provided adequate until PCP appointment only.  A 30 day supply of her medications have been sent to [facility] in [facility] with #5 valium 5 mg tabs; and #15 ultram 50 mg tabs  Time spent with patient: 40 minutes: discussed home health needs; dme and medications; have verbalized understanding.     Ok Edwards NP [facility]  Contact [phone] Monday through Friday 8am- 5pm  After hours call [phone]

## 2018-04-07 DIAGNOSIS — K219 Gastro-esophageal reflux disease without esophagitis: Secondary | ICD-10-CM | POA: Diagnosis not present

## 2018-04-07 DIAGNOSIS — Z96642 Presence of left artificial hip joint: Secondary | ICD-10-CM | POA: Diagnosis not present

## 2018-04-07 DIAGNOSIS — Z86718 Personal history of other venous thrombosis and embolism: Secondary | ICD-10-CM | POA: Diagnosis not present

## 2018-04-07 DIAGNOSIS — Z853 Personal history of malignant neoplasm of breast: Secondary | ICD-10-CM | POA: Diagnosis not present

## 2018-04-07 DIAGNOSIS — F329 Major depressive disorder, single episode, unspecified: Secondary | ICD-10-CM | POA: Diagnosis not present

## 2018-04-07 DIAGNOSIS — K5904 Chronic idiopathic constipation: Secondary | ICD-10-CM | POA: Diagnosis not present

## 2018-04-07 DIAGNOSIS — F331 Major depressive disorder, recurrent, moderate: Secondary | ICD-10-CM | POA: Diagnosis not present

## 2018-04-07 DIAGNOSIS — C7802 Secondary malignant neoplasm of left lung: Secondary | ICD-10-CM | POA: Diagnosis not present

## 2018-04-07 DIAGNOSIS — M810 Age-related osteoporosis without current pathological fracture: Secondary | ICD-10-CM | POA: Diagnosis not present

## 2018-04-07 DIAGNOSIS — R42 Dizziness and giddiness: Secondary | ICD-10-CM | POA: Diagnosis not present

## 2018-04-07 DIAGNOSIS — G4733 Obstructive sleep apnea (adult) (pediatric): Secondary | ICD-10-CM | POA: Diagnosis not present

## 2018-04-07 DIAGNOSIS — Z471 Aftercare following joint replacement surgery: Secondary | ICD-10-CM | POA: Diagnosis not present

## 2018-04-07 DIAGNOSIS — I10 Essential (primary) hypertension: Secondary | ICD-10-CM | POA: Diagnosis not present

## 2018-04-07 DIAGNOSIS — Z9011 Acquired absence of right breast and nipple: Secondary | ICD-10-CM | POA: Diagnosis not present

## 2018-04-08 ENCOUNTER — Other Ambulatory Visit: Payer: Self-pay | Admitting: Psychiatry

## 2018-04-08 ENCOUNTER — Other Ambulatory Visit: Payer: Self-pay | Admitting: *Deleted

## 2018-04-08 MED ORDER — TRAZODONE HCL 100 MG PO TABS: 100.0000 mg | ORAL_TABLET | Freq: Every day | ORAL | 0 refills | Status: DC

## 2018-04-08 MED ORDER — DULOXETINE HCL 60 MG PO CPEP: 60.0000 mg | ORAL_CAPSULE | Freq: Every morning | ORAL | 0 refills | Status: DC

## 2018-04-08 NOTE — Patient Outreach (Signed)
[facility]) Care Management  04/08/2018  Breanna Zhang 1940-04-03 [id number]   Subjective: Telephone call to patient's home number, no answer, left HIPAA compliant voicemail message, and requested call back.     Objective: Per KPN (Knowledge Performance Now, point of care tool) and chart review,  Patient hospitalized 03/19/2018 - 03/22/2018 for PRIMARY OSTEOARTHRITIS OF LEFT HIP, status post TOTAL HIP ARTHROPLASTY LEFT (Left) on 03/19/2018.   Patient discharged from skilled nursing facility ( [place] [facility]) on 04/03/2018.   Patient also has a history of hypertension, OSA, breast cancer with chemotherapy, Leukopenia due to antineoplastic chemotherapy, Hyperlipidemia, Metastatic breast cancer ([place]), Lung cancer, Arthralgia of both hands, Deafness, sensorineural, Diverticulosis, and  Atrial fibrillation.       Assessment: Received [employer] Transition of care follow up referral on 04/05/2018.   Transition of care follow up pending patient contact.       Plan: RNCM will send unsuccessful outreach  letter, [facility] pamphlet, will call patient for 2nd telephone outreach attempt within 4 business days,  transition of care follow up, and proceed with case closure, within 10 business days if no return call.       Thoma Paulsen H. Annia Friendly, BSN, [place] Management [facility] Telephonic CM Phone: [phone] Fax: [phone]

## 2018-04-09 DIAGNOSIS — G4733 Obstructive sleep apnea (adult) (pediatric): Secondary | ICD-10-CM | POA: Diagnosis not present

## 2018-04-10 ENCOUNTER — Encounter: Payer: Self-pay | Admitting: *Deleted

## 2018-04-10 ENCOUNTER — Other Ambulatory Visit: Payer: Self-pay | Admitting: *Deleted

## 2018-04-10 NOTE — Patient Outreach (Signed)
[employer] Breanna Zhang) Care Management  04/10/2018  Breanna Zhang 1940/08/04 [id number]   Subjective: Received voicemail message from Merry Proud, Physical Therapist at [facility], states he is in patient's home, patient has questions regarding [facility], he is calling on patients behalf, and patient has requested a call back at home number.  Merry Proud states can call him if [facility] has any additional questions at [phone]). Telephone call to patient's home number, spoke with patient, and HIPAA verified.  Discussed [facility] Care Management [employer] Transition of care follow up, patient voiced understanding, and is in agreement to follow up.  Patient states she is doing very well, receiving home health therapy, therapist very pleased with her progress, has had 1 follow up with surgeon's physician assistant on 04/03/2018, appointment went well, very pleased with progress, has a follow up appointment with the surgeon on 04/26/2018.  States she is managing pain with Tylenol as needed and pain in decreasing everyday.  Patient states she is able to manage self care and has assistance as needed.  Patient voices understanding of medical diagnosis, surgery, and treatment plan. Patient states she does not have any education material, transition of care, care coordination, disease management, disease monitoring, transportation, community resource, or pharmacy needs at this time. States she is very appreciative of the follow up and is in agreement to receive [place] Management information.     Objective: Per KPN (Knowledge Performance Now, point of care tool) and chart review,  Patient hospitalized 03/19/2018 - 03/22/2018 for PRIMARY OSTEOARTHRITIS OF LEFT HIP, status post TOTAL HIP ARTHROPLASTY LEFT (Left) on 03/19/2018.   Patient discharged from skilled nursing facility ( [place] [facility] [place] [place]) on 04/03/2018.   Patient also has a history of hypertension, OSA, breast cancer with chemotherapy,  Leukopenia due to antineoplastic chemotherapy, Hyperlipidemia, Metastatic breast cancer ([place]), Lung cancer, Arthralgia of both hands, Deafness, sensorineural, Diverticulosis, and  Atrial fibrillation.       Assessment: Received [employer] Transition of care follow up referral on 04/05/2018.   Transition of care follow up completed, no care management needs, and will proceed with case closure.        Plan: RNCM will send patient successful outreach letter, [facility] pamphlet, and magnet. RNCM will complete case closure due to follow up completed / no care management needs.  RNCM will send MD case closure letter.        Keigo Whalley H. Annia Friendly, BSN, [place] Management [facility] Telephonic CM Phone: [phone] Fax: [phone]

## 2018-04-12 ENCOUNTER — Other Ambulatory Visit: Payer: Self-pay | Admitting: Adult Health

## 2018-04-17 DIAGNOSIS — I10 Essential (primary) hypertension: Secondary | ICD-10-CM | POA: Diagnosis not present

## 2018-04-17 DIAGNOSIS — G4733 Obstructive sleep apnea (adult) (pediatric): Secondary | ICD-10-CM | POA: Diagnosis not present

## 2018-04-17 DIAGNOSIS — Z471 Aftercare following joint replacement surgery: Secondary | ICD-10-CM | POA: Diagnosis not present

## 2018-04-17 DIAGNOSIS — Z853 Personal history of malignant neoplasm of breast: Secondary | ICD-10-CM | POA: Diagnosis not present

## 2018-04-17 DIAGNOSIS — Z86718 Personal history of other venous thrombosis and embolism: Secondary | ICD-10-CM | POA: Diagnosis not present

## 2018-04-17 DIAGNOSIS — C7802 Secondary malignant neoplasm of left lung: Secondary | ICD-10-CM | POA: Diagnosis not present

## 2018-04-17 DIAGNOSIS — M810 Age-related osteoporosis without current pathological fracture: Secondary | ICD-10-CM | POA: Diagnosis not present

## 2018-04-17 DIAGNOSIS — F329 Major depressive disorder, single episode, unspecified: Secondary | ICD-10-CM | POA: Diagnosis not present

## 2018-04-17 DIAGNOSIS — R42 Dizziness and giddiness: Secondary | ICD-10-CM | POA: Diagnosis not present

## 2018-04-17 DIAGNOSIS — K5904 Chronic idiopathic constipation: Secondary | ICD-10-CM | POA: Diagnosis not present

## 2018-04-17 DIAGNOSIS — Z96642 Presence of left artificial hip joint: Secondary | ICD-10-CM | POA: Diagnosis not present

## 2018-04-17 DIAGNOSIS — K219 Gastro-esophageal reflux disease without esophagitis: Secondary | ICD-10-CM | POA: Diagnosis not present

## 2018-04-17 DIAGNOSIS — F331 Major depressive disorder, recurrent, moderate: Secondary | ICD-10-CM | POA: Diagnosis not present

## 2018-04-17 DIAGNOSIS — Z9011 Acquired absence of right breast and nipple: Secondary | ICD-10-CM | POA: Diagnosis not present

## 2018-04-19 MED FILL — IBRANCE 100 MG CAPSULE: 100 | 28 days supply | Qty: 21 | Fill #1

## 2018-04-21 DIAGNOSIS — I10 Essential (primary) hypertension: Secondary | ICD-10-CM | POA: Diagnosis not present

## 2018-04-21 DIAGNOSIS — F329 Major depressive disorder, single episode, unspecified: Secondary | ICD-10-CM | POA: Diagnosis not present

## 2018-04-21 DIAGNOSIS — K5904 Chronic idiopathic constipation: Secondary | ICD-10-CM | POA: Diagnosis not present

## 2018-04-21 DIAGNOSIS — C7802 Secondary malignant neoplasm of left lung: Secondary | ICD-10-CM | POA: Diagnosis not present

## 2018-04-21 DIAGNOSIS — M810 Age-related osteoporosis without current pathological fracture: Secondary | ICD-10-CM | POA: Diagnosis not present

## 2018-04-21 DIAGNOSIS — Z471 Aftercare following joint replacement surgery: Secondary | ICD-10-CM | POA: Diagnosis not present

## 2018-04-21 DIAGNOSIS — Z96642 Presence of left artificial hip joint: Secondary | ICD-10-CM | POA: Diagnosis not present

## 2018-04-21 DIAGNOSIS — K219 Gastro-esophageal reflux disease without esophagitis: Secondary | ICD-10-CM | POA: Diagnosis not present

## 2018-04-22 ENCOUNTER — Inpatient Hospital Stay: Payer: PPO | Admitting: Oncology

## 2018-04-22 ENCOUNTER — Inpatient Hospital Stay: Payer: PPO

## 2018-04-24 DIAGNOSIS — T451X5A Adverse effect of antineoplastic and immunosuppressive drugs, initial encounter: Secondary | ICD-10-CM | POA: Diagnosis not present

## 2018-04-24 DIAGNOSIS — K219 Gastro-esophageal reflux disease without esophagitis: Secondary | ICD-10-CM | POA: Diagnosis not present

## 2018-04-24 DIAGNOSIS — D701 Agranulocytosis secondary to cancer chemotherapy: Secondary | ICD-10-CM | POA: Diagnosis not present

## 2018-04-24 DIAGNOSIS — G4733 Obstructive sleep apnea (adult) (pediatric): Secondary | ICD-10-CM | POA: Diagnosis not present

## 2018-04-24 DIAGNOSIS — I10 Essential (primary) hypertension: Secondary | ICD-10-CM | POA: Diagnosis not present

## 2018-04-24 DIAGNOSIS — R739 Hyperglycemia, unspecified: Secondary | ICD-10-CM | POA: Diagnosis not present

## 2018-04-24 DIAGNOSIS — E785 Hyperlipidemia, unspecified: Secondary | ICD-10-CM | POA: Diagnosis not present

## 2018-04-24 DIAGNOSIS — F3342 Major depressive disorder, recurrent, in full remission: Secondary | ICD-10-CM | POA: Diagnosis not present

## 2018-04-24 DIAGNOSIS — C50919 Malignant neoplasm of unspecified site of unspecified female breast: Secondary | ICD-10-CM | POA: Diagnosis not present

## 2018-04-24 DIAGNOSIS — Z96642 Presence of left artificial hip joint: Secondary | ICD-10-CM | POA: Diagnosis not present

## 2018-04-24 DIAGNOSIS — C7802 Secondary malignant neoplasm of left lung: Secondary | ICD-10-CM | POA: Diagnosis not present

## 2018-04-24 DIAGNOSIS — M5136 Other intervertebral disc degeneration, lumbar region: Secondary | ICD-10-CM | POA: Diagnosis not present

## 2018-04-24 DIAGNOSIS — E034 Atrophy of thyroid (acquired): Secondary | ICD-10-CM | POA: Diagnosis not present

## 2018-04-24 LAB — CANCER ANTIGEN 27.29: CA 27.29: UNDETERMINED

## 2018-04-24 LAB — CANCER ANTIGEN 15-3: CA 15-3: UNDETERMINED

## 2018-04-25 ENCOUNTER — Telehealth: Payer: Self-pay | Admitting: *Deleted

## 2018-04-25 NOTE — Telephone Encounter (Signed)
-----   Message from Sindy Guadeloupe, MD sent at 04/25/2018  8:20 AM EST ----- She needs repeat blood draw for tumor markers. Thanks, Astrid Divine

## 2018-04-25 NOTE — Telephone Encounter (Signed)
I called daughter Ms. Unk Lightning about the patient's from December 23 had just sent back a message to [place] stating that they were unable to perform the tumor marker test.  I have a call into [facility] so they can look into why the specimen was not tested and why it took so long to let us knowI called the Daughter about labs.  Patient had appt 2/10 for labs-including the tumor markers for 2/10 but daughter had called and cancelled it. Patient was having a bad day with hurting and weather made it worse for her. She is doing well and she started her 2nd cycle of ibrance ( since surgery)2/11.  She had labs done in [facility] 2/12 and wbc, hgb, anc, and plt were ok to have started the meds. I did not know if you want me to r/s her to come in soon or wait til she is on her week off and repeat tumor markers and cbcd and met c at that time. Let me know. Daughter said she will do what ever you need.  I will call daughter back tomorrow when I get an answer from Dr. Janese Banks.   I spoke to a [place] [facility] and she will get back with me.  I faxed the information in our epic system about the 2 labs and when they were drawn and when they were resulted.

## 2018-04-26 ENCOUNTER — Telehealth: Payer: Self-pay | Admitting: *Deleted

## 2018-04-26 DIAGNOSIS — C50919 Malignant neoplasm of unspecified site of unspecified female breast: Secondary | ICD-10-CM

## 2018-04-26 NOTE — Telephone Encounter (Signed)
Daughter today and let her know that Dr. Janese Banks would like to see her on her week off from the [place].  Would also like a CT of the chest abdomen pelvis 1 day prior to coming to see [place].  When I spoke to the daughter before she had started her next dose of Ibrance on 2/11 I have talked to her about making an appointment for scan on March 5 and then coming to see the doctor on March 6.  Daughter is agreeable so I will get this set up and arranged and call the daughter back

## 2018-04-26 NOTE — Telephone Encounter (Signed)
-----   Message from Sindy Guadeloupe, MD sent at 04/26/2018  8:14 AM EST ----- She will need ct chest abdomen pelvis with contrast prior

## 2018-04-29 ENCOUNTER — Ambulatory Visit: Payer: PPO | Admitting: Psychiatry

## 2018-04-29 ENCOUNTER — Telehealth: Payer: Self-pay

## 2018-04-29 NOTE — Telephone Encounter (Signed)
Called Mrs Hollice Gong daughter Basilia Jumbo @ [phone] to inform her of mothers upcoming CT scan ,and repeat appt March 6 /2012  @ 1030 am  -asked her to return our call for specifics on CT scan ,and prep. CAW

## 2018-04-30 DIAGNOSIS — Z9621 Cochlear implant status: Secondary | ICD-10-CM | POA: Diagnosis not present

## 2018-04-30 DIAGNOSIS — H903 Sensorineural hearing loss, bilateral: Secondary | ICD-10-CM | POA: Diagnosis not present

## 2018-04-30 DIAGNOSIS — C50919 Malignant neoplasm of unspecified site of unspecified female breast: Secondary | ICD-10-CM | POA: Diagnosis not present

## 2018-05-01 ENCOUNTER — Other Ambulatory Visit: Payer: Self-pay | Admitting: Pharmacist

## 2018-05-01 ENCOUNTER — Telehealth: Payer: Self-pay | Admitting: *Deleted

## 2018-05-01 NOTE — Telephone Encounter (Signed)
Called daughter and she answered the phone to let her know that I did get the CT worked out as well as a follow-up appointment.  Got the message that Gerald Stabs had left a few days ago.  She is fine with doing a scan and then seeing the doctor with labs the next day for her mom and I will put it in the mail for her.  Daughter states it is fine to send it to her mom's address that she checks the mail and they will make it to the appointments

## 2018-05-01 NOTE — Patient Outreach (Signed)
[facility]) Care Management  [place]   05/01/2018  MARCIEL OFFENBERGER 09/02/1940 [id number]  Reason for referral: Medication Reconciliation Post Discharge  Referral source: [employer]   PMHx includes but not limited to: breast cancer, osteoarthritis s/p L total hip arthroplasty, GERD  Subjective: Successful telephone call with patient.  HIPAA identifiers verified. She notes that she is doing well s/p total hip arthroplasty on 03/19/2018. She was [facility] from [facility] on 03/[facility] to SNF, and discharged home on 04/03/2018.   Today, she notes that her pain is improving. She is sometimes "stiff" and in moderate pain when she first wakes up, but she takes 2 acetaminophen, and that plus movement relieves the pain. She has not needed tramadol.   She notes that her husband helps her fill a pill box currently, but she plans to transition to [facility] in ~ April when she is due for refills. She notes that she is excited about this to help her better manage her medications. For example, she notes that "one of the cancer medicines" and her vitamin B12 are the same color, and she occasionally confuses them.    Objective: Lab Results  Component Value Date   CREATININE 0.72 03/21/2018   CREATININE 0.79 03/20/2018   CREATININE 0.83 03/07/2018  eGFR >60 mL/min  Hemoglobin A1c 5.8% (11/26/2017, Care Everywhere)  Lipid Panel  Chol: 129 TG: 67 HDL 46.2 LDL 69 (11/25/2017, Care Everywhere)  BP Readings from Last 3 Encounters:  04/01/18 (!) 134/58  03/26/18 (!) 118/55  03/25/18 111/68    Allergies  Allergen Reactions  . Imipramine Tinitus  . Latex Rash  . Penicillins Rash  . Tape Rash    Medications Reviewed Today    Reviewed by De Hollingshead, [facility] (Pharmacist) on 05/01/18 at [phone]  Med List Status: <None>  Medication Order Taking? Sig Documenting Provider Last Dose Status Informant  acetaminophen (TYLENOL) 325 MG tablet [id number] Yes Take 650  mg by mouth every 4 (four) hours as needed. For pain / increased temp.  May be administered orally,  per G-Tube if needed or rectally if unable to swallow (separate order).  Maximum dose for 24 hours is 3,000 mg from all sources of Acetaminophen / Tylenol [provider] Taking Active            Med Note Darnelle Maffucci, Arville Lime   Wed May 01, 2018  9:46 AM) Dewaine Conger 2 every day  atorvastatin (LIPITOR) 40 MG tablet [id number] Yes Take 1 tablet (40 mg total) by mouth at bedtime. Gerlene Fee, NP Taking Active   cholecalciferol (VITAMIN D) 1000 units tablet [id number] Yes Take 1,000 Units by mouth daily. [provider] Taking Active Self  cyanocobalamin (V-R VITAMIN B-12) 500 MCG tablet [id number] Yes Take 500 mcg by mouth daily.  [provider] Taking Active Self           Med Note Francene Finders   Thu Jun 07, 2017  9:34 AM)    DULoxetine (CYMBALTA) 60 MG capsule [id number] Yes Take 1 capsule (60 mg total) by mouth every morning. Rainey Pines, MD Taking Active   letrozole [facility]) 2.5 MG tablet [id number] Yes Take 1 tablet (2.5 mg total) by mouth daily. Gerlene Fee, NP Taking Active   ondansetron (ZOFRAN) 4 MG tablet [id number] No Take 1 tablet (4 mg total) by mouth every 6 (six) hours as needed for nausea or vomiting.  Patient not taking:  Reported on 05/01/2018   Gerlene Fee,  NP Not Taking Active   palbociclib (IBRANCE) 100 MG capsule [id number] Yes Take 100 mg by mouth daily with breakfast. Take whole with food. Take for 21 days on, 7 days off, repeat every 28 days. [provider] Taking Active   pantoprazole (PROTONIX) 40 MG tablet [id number] Yes Take 1 tablet (40 mg total) by mouth 2 (two) times daily. Gerlene Fee, NP Taking Active   polyethylene glycol [facility] / GLYCOLAX) packet [id number] Yes Take 17 g by mouth at bedtime. [provider] Taking Active   senna (SENOKOT) 8.6 MG TABS tablet [id number] Yes Take 2 tablets by mouth 2 (two) times  daily. [provider] Taking Active            Med Note Darnelle Maffucci, Arville Lime   Wed May 01, 2018  9:49 AM) Takes 1 tab daily  SYNTHROID 88 MCG tablet [id number] Yes Take 1 tablet (88 mcg total) by mouth daily before breakfast. Gerlene Fee, NP Taking Active   traMADol (ULTRAM) 50 MG tablet [id number] No Take 1 tablet (50 mg total) by mouth every 6 (six) hours as needed for severe pain.  Patient not taking:  Reported on 04/10/2018   Gerlene Fee, NP Not Taking Active            Med Note Burt Knack, Karena Addison Apr 10, 2018  4:40 PM) States not needed.   traZODone (DESYREL) 100 MG tablet [id number] Yes Take 1 tablet (100 mg total) by mouth at bedtime. Rainey Pines, MD Taking Active           Assessment:  Date Discharged from Hospital: 04/03/2018 Date Medication Reconciliation Performed: 05/01/2018  Medications:  New at Discharge: . Lovenox (completed course)  . Tramadol  . Cephalexin (completed course)  Patient was recently discharged from hospital and all medications have been reviewed.   Medication Review Findings:  . Palbociclib may increase trazodone and atorvastatin concentrations via CYP3A4 inhibition. Continue to monitor for trazodone/atorvastatin side effects and consider dose reduction or change in therapy if needed  Plan: - Patient denies any questions or concerns at this time. Provided her with my contact information for any future needs.  - Will route message to PCP Dr. Ramonita Lab for [facility], PharmD, [place] PGY2 Ambulatory Care Pharmacy Resident, [place] Phone: [phone]

## 2018-05-03 DIAGNOSIS — M1612 Unilateral primary osteoarthritis, left hip: Secondary | ICD-10-CM | POA: Diagnosis not present

## 2018-05-13 ENCOUNTER — Other Ambulatory Visit: Payer: Self-pay

## 2018-05-13 DIAGNOSIS — C50919 Malignant neoplasm of unspecified site of unspecified female breast: Secondary | ICD-10-CM

## 2018-05-13 DIAGNOSIS — H353211 Exudative age-related macular degeneration, right eye, with active choroidal neovascularization: Secondary | ICD-10-CM | POA: Diagnosis not present

## 2018-05-15 DIAGNOSIS — H353221 Exudative age-related macular degeneration, left eye, with active choroidal neovascularization: Secondary | ICD-10-CM | POA: Diagnosis not present

## 2018-05-15 MED FILL — IBRANCE 100 MG CAPSULE: 100 | 28 days supply | Qty: 21 | Fill #2

## 2018-05-16 ENCOUNTER — Other Ambulatory Visit: Payer: Self-pay

## 2018-05-16 ENCOUNTER — Ambulatory Visit
Admission: RE | Admit: 2018-05-16 | Discharge: 2018-05-16 | Disposition: A | Discharge status: Home / Self Care | Payer: PPO | Source: Ambulatory Visit | Attending: Oncology | Admitting: Oncology

## 2018-05-16 DIAGNOSIS — K449 Diaphragmatic hernia without obstruction or gangrene: Secondary | ICD-10-CM | POA: Diagnosis not present

## 2018-05-16 DIAGNOSIS — J90 Pleural effusion, not elsewhere classified: Secondary | ICD-10-CM | POA: Insufficient documentation

## 2018-05-16 DIAGNOSIS — C50919 Malignant neoplasm of unspecified site of unspecified female breast: Secondary | ICD-10-CM

## 2018-05-16 DIAGNOSIS — R918 Other nonspecific abnormal finding of lung field: Secondary | ICD-10-CM | POA: Diagnosis not present

## 2018-05-16 DIAGNOSIS — Z853 Personal history of malignant neoplasm of breast: Secondary | ICD-10-CM | POA: Diagnosis not present

## 2018-05-16 LAB — POCT I-STAT CREATININE: Creatinine, Ser: 0.7 mg/dL (ref 0.44–1.00)

## 2018-05-16 MED ORDER — IOHEXOL 300 MG/ML  SOLN
100.0000 mL | Freq: Once | INTRAMUSCULAR | Status: AC | PRN
  Administered 2018-05-16: 100 mL via INTRAVENOUS

## 2018-05-17 ENCOUNTER — Inpatient Hospital Stay: Payer: PPO | Attending: Oncology | Admitting: Oncology

## 2018-05-17 ENCOUNTER — Encounter: Payer: Self-pay | Admitting: Oncology

## 2018-05-17 ENCOUNTER — Inpatient Hospital Stay: Payer: PPO

## 2018-05-17 ENCOUNTER — Other Ambulatory Visit: Payer: Self-pay

## 2018-05-17 VITALS — BP 129/76 | HR 76 | Temp 97.3°F | Resp 12 | Ht 66.0 in | Wt 175.4 lb

## 2018-05-17 DIAGNOSIS — Z9011 Acquired absence of right breast and nipple: Secondary | ICD-10-CM

## 2018-05-17 DIAGNOSIS — E079 Disorder of thyroid, unspecified: Secondary | ICD-10-CM | POA: Insufficient documentation

## 2018-05-17 DIAGNOSIS — Z79811 Long term (current) use of aromatase inhibitors: Secondary | ICD-10-CM | POA: Insufficient documentation

## 2018-05-17 DIAGNOSIS — Z79899 Other long term (current) drug therapy: Secondary | ICD-10-CM | POA: Diagnosis not present

## 2018-05-17 DIAGNOSIS — F329 Major depressive disorder, single episode, unspecified: Secondary | ICD-10-CM

## 2018-05-17 DIAGNOSIS — F419 Anxiety disorder, unspecified: Secondary | ICD-10-CM | POA: Diagnosis not present

## 2018-05-17 DIAGNOSIS — G4733 Obstructive sleep apnea (adult) (pediatric): Secondary | ICD-10-CM | POA: Diagnosis not present

## 2018-05-17 DIAGNOSIS — C50911 Malignant neoplasm of unspecified site of right female breast: Secondary | ICD-10-CM | POA: Diagnosis not present

## 2018-05-17 DIAGNOSIS — I10 Essential (primary) hypertension: Secondary | ICD-10-CM | POA: Insufficient documentation

## 2018-05-17 DIAGNOSIS — I4891 Unspecified atrial fibrillation: Secondary | ICD-10-CM | POA: Diagnosis not present

## 2018-05-17 DIAGNOSIS — C50919 Malignant neoplasm of unspecified site of unspecified female breast: Secondary | ICD-10-CM

## 2018-05-17 DIAGNOSIS — Z170 Estrogen receptor positive status [ER+]: Secondary | ICD-10-CM | POA: Insufficient documentation

## 2018-05-17 DIAGNOSIS — Z86718 Personal history of other venous thrombosis and embolism: Secondary | ICD-10-CM | POA: Diagnosis not present

## 2018-05-17 DIAGNOSIS — C782 Secondary malignant neoplasm of pleura: Secondary | ICD-10-CM | POA: Diagnosis not present

## 2018-05-17 LAB — COMPREHENSIVE METABOLIC PANEL
ALT: 11 U/L (ref 0–44)
ANION GAP: 4 — AB (ref 5–15)
AST: 18 U/L (ref 15–41)
Albumin: 3.9 g/dL (ref 3.5–5.0)
Alkaline Phosphatase: 53 U/L (ref 38–126)
BUN: 9 mg/dL (ref 8–23)
CO2: 26 mmol/L (ref 22–32)
Calcium: 7.6 mg/dL — ABNORMAL LOW (ref 8.9–10.3)
Chloride: 107 mmol/L (ref 98–111)
Creatinine, Ser: 0.89 mg/dL (ref 0.44–1.00)
GFR calc Af Amer: >60 mL/min (ref >60)
GFR calc non Af Amer: >60 mL/min (ref >60)
Glucose, Bld: 101 mg/dL — ABNORMAL HIGH (ref 70–99)
POTASSIUM: 3.5 mmol/L (ref 3.5–5.1)
Sodium: 137 mmol/L (ref 135–145)
Total Bilirubin: 0.6 mg/dL (ref 0.3–1.2)
Total Protein: 6.9 g/dL (ref 6.5–8.1)

## 2018-05-17 LAB — CBC WITH DIFFERENTIAL/PLATELET
Abs Immature Granulocytes: 0 10*3/uL (ref 0.00–0.07)
BASOS ABS: 0.1 10*3/uL (ref 0.0–0.1)
Basophils Relative: 1 %
Eosinophils Absolute: 0 10*3/uL (ref 0.0–0.5)
Eosinophils Relative: 1 %
HCT: 30.7 % — ABNORMAL LOW (ref 36.0–46.0)
Hemoglobin: 10.2 g/dL — ABNORMAL LOW (ref 12.0–15.0)
Immature Granulocytes: 0 %
Lymphocytes Relative: 52 %
Lymphs Abs: 2.2 10*3/uL (ref 0.7–4.0)
MCH: 32.5 pg (ref 26.0–34.0)
MCHC: 33.2 g/dL (ref 30.0–36.0)
MCV: 97.8 fL (ref 80.0–100.0)
Monocytes Absolute: 0.4 10*3/uL (ref 0.1–1.0)
Monocytes Relative: 9 %
NEUTROS ABS: 1.5 10*3/uL — AB (ref 1.7–7.7)
Neutrophils Relative %: 37 %
Platelets: 145 10*3/uL — ABNORMAL LOW (ref 150–400)
RBC: 3.14 MIL/uL — AB (ref 3.87–5.11)
RDW: 16.2 % — ABNORMAL HIGH (ref 11.5–15.5)
WBC: 4.1 10*3/uL (ref 4.0–10.5)
nRBC: 0 % (ref 0.0–0.2)

## 2018-05-17 NOTE — Progress Notes (Signed)
Patient here for follow up. She reports that she has had hip replacement in January and tolerated procedure well.

## 2018-05-18 LAB — CANCER ANTIGEN 15-3: CA 15-3: 33.7 U/mL — ABNORMAL HIGH (ref 0.0–25.0)

## 2018-05-18 LAB — CANCER ANTIGEN 27.29: CA 27.29: 38.1 U/mL (ref 0.0–38.6)

## 2018-05-20 NOTE — Progress Notes (Signed)
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 She underwent mastectomy followed by adjuvant chemotherapy and 5 years of tamoxifen. Patient has been having some low back pain and underwent a CT lumbar spine without contrast on 08/10/2017 which was done by the pain clinic. CT mainly showed age-related degenerative disease but also showed incidental nodular thickening of the posterior left diaphragm concerning for tumor and a CT chest was recommended.  CT chest on 08/14/2017 showed scattered left pleural nodularity with tiny left pleural effusion and prominent left internal mammary and juxtadiaphragmatic lymph nodes which are nonspecific but metastatic disease could not be ruled out.  This was followed by a PET/CT scan on 08/22/2017 which showed numerous small left-sided pleural nodules which were mildly hypermetabolic along with hypermetabolic mediastinal lymph nodes concerning for metastatic disease. Small metastatic nodule in the left upper quadrant partly in the retrocrural space. No findings of pulmonary metastatic or osseous metastatic disease.   Patient underwent FNA of retrocrural LN which was positive for carcinoma but primary could not be ascertained. She then  underwent pleural biopsy which showed metastatic carcinoma consistent with breast primary. ER+HER-2. Patient started taking Ibrance in August 2019  RBC testing showedPIKCA and BRCA mutation  Scans have shown stable to improved disease so far   Interval history-patient recently underwent left hip surgery and has been doing remarkably well since then.  She does not require a walker or cane to ambulate anymore and her left hip pain has almost resolved.  She needs an occasional Tylenol for it.  She has some mild chronic fatigue but denies any cough or shortness of breath.  Denies any pain  ECOG PS- 1 Pain scale- 0 Opioid associated constipation- no  Review of systems- Review of Systems  Constitutional: Positive for malaise/fatigue. Negative for chills, fever and weight loss.  HENT: Negative for congestion, ear discharge and nosebleeds.   Eyes: Negative for blurred vision.  Respiratory: Negative for cough, hemoptysis, sputum production, shortness of breath and wheezing.   Cardiovascular: Negative for chest pain, palpitations, orthopnea and claudication.  Gastrointestinal: Negative for abdominal pain, blood in stool, constipation, diarrhea, heartburn, melena, nausea and vomiting.  Genitourinary: Negative for dysuria, flank pain, frequency, hematuria and urgency.  Musculoskeletal: Negative for back pain, joint pain and myalgias.  Skin: Negative for rash.  Neurological: Negative for dizziness, tingling, focal weakness, seizures, weakness and headaches.  Endo/Heme/Allergies: Does not bruise/bleed easily.  Psychiatric/Behavioral: Negative for depression and suicidal ideas. The patient does not have insomnia.        Allergies  Allergen Reactions  . Imipramine Tinitus  . Latex Rash  . Penicillins Rash  . Tape Rash     Past Medical History:  Diagnosis Date  . Anxiety   . Atrial fibrillation ([place])   . Breast cancer ([place]) 1999   RT MASTECTOMY and  chemo tx's.   . DDD (degenerative  disc disease), cervical   . Depression   . Diverticulosis   . Diverticulosis   . DVT of lower extremity (deep venous thrombosis) ([place])   . Dyspnea   . Family history of colon cancer   . Family history of lung cancer   . Family history of throat cancer   . GERD (gastroesophageal reflux disease)   . Goiter   . Headache   . History of chemotherapy 2000   BREAST CA  . Hypertension   . Incontinence   . OSA (obstructive sleep apnea)   . Osteoporosis   . Personal history of chemotherapy 1999   BREAST CA  . Status post chemotherapy    2000 right breast cancer  . Thyroid disease   . UTI (urinary tract infection)   . Vertigo      Past Surgical History:  Procedure Laterality Date  . ABDOMINAL HYSTERECTOMY    . BREAST BIOPSY Left 2006   negative  . BUNIONECTOMY Bilateral    Screws implanted.  . CHOLECYSTECTOMY    . COCHLEAR IMPLANT Right   . COLONOSCOPY WITH PROPOFOL N/A 07/24/2016   Procedure: COLONOSCOPY WITH PROPOFOL;  Surgeon: Manya Silvas, MD;  Location: [facility] ENDOSCOPY;  Service: Endoscopy;  Laterality: N/A;  . ESOPHAGOGASTRODUODENOSCOPY (EGD) WITH PROPOFOL N/A 04/12/2016   Procedure: ESOPHAGOGASTRODUODENOSCOPY (EGD) WITH PROPOFOL;  Surgeon: Manya Silvas, MD;  Location[facility] ENDOSCOPY;  Service: Endoscopy;  Laterality: N/A;  . EYE SURGERY Bilateral    cataract  . FOOT SURGERY Bilateral   . HAND SURGERY Right   . MASTECTOMY Right 1999   BREAST CA  . MASTECTOMY Right 1999  . PARTIAL COLECTOMY    . THYROIDECTOMY    . TOTAL HIP ARTHROPLASTY Left 03/19/2018   Procedure: TOTAL HIP ARTHROPLASTY LEFT;  Surgeon: Hessie Knows, MD;  Location: [facility] ORS;  Service: Orthopedics;  Laterality: Left;  Marland Kitchen VIDEO ASSISTED THORACOSCOPY Left 09/10/2017   Procedure: VIDEO ASSISTED THORACOSCOPY;  Surgeon: [name], MD;  Location: [facility] ORS;  Service: Thoracic;  Laterality: Left;  with biopsies  . VIDEO BRONCHOSCOPY  09/10/2017   Procedure: VIDEO BRONCHOSCOPY;  Surgeon: [name], MD;   Location: [facility] ORS;  Service: Thoracic;;    Social History   Socioeconomic History  . Marital status: Married    Spouse name: evert  . Number of children: 4  . Years of education: Not on file  . Highest education level: 9th grade  Occupational History  . Not on file  Social Needs  . Financial resource strain: Somewhat hard  . Food insecurity:    Worry: Often true    Inability: Often true  . Transportation needs:    Medical: No    Non-medical: No  Tobacco Use  . Smoking status: Never Smoker  . Smokeless tobacco: Never Used  Substance and Sexual Activity  . Alcohol use: No    Alcohol/week: 0.0 standard drinks  . Drug use: No  . Sexual activity: Not Currently  Lifestyle  . Physical activity:    Days per week: 0 days    Minutes per session: 0 min  . Stress: Not at all  Relationships  . Social connections:    Talks on phone: Not on file    Gets together: Not on file    Attends religious service: More than 4 times per year    Active member of club or organization: No    Attends meetings of clubs or organizations: Never  Relationship status: Married  . Intimate partner violence:    Fear of current or ex partner: No    Emotionally abused: No    Physically abused: No    Forced sexual activity: No  Other Topics Concern  . Not on file  Social History Narrative  . Not on file    Family History  Problem Relation Age of Onset  . Dementia Mother   . Dementia Sister   . Diabetes Sister   . Heart attack Brother 56  . COPD Brother   . Lung cancer Brother        hx smoking  . Colon cancer Brother   . Liver cancer Brother   . COPD Brother   . Lung cancer Brother        hx smoking  . Colon cancer Maternal Grandfather        dx >.50  . Breast cancer Neg Hx      Current Outpatient Medications:  .  acetaminophen (TYLENOL) 325 MG tablet, Take 650 mg by mouth every 4 (four) hours as needed. For pain / increased temp.  May be administered orally,  per G-Tube if needed  or rectally if unable to swallow (separate order).  Maximum dose for 24 hours is 3,000 mg from all sources of Acetaminophen / Tylenol, Disp: , Rfl:  .  atorvastatin (LIPITOR) 40 MG tablet, Take 1 tablet (40 mg total) by mouth at bedtime., Disp: 30 tablet, Rfl: 0 .  cholecalciferol (VITAMIN D) 1000 units tablet, Take 1,000 Units by mouth daily., Disp: , Rfl:  .  cyanocobalamin (V-R VITAMIN B-12) 500 MCG tablet, Take 500 mcg by mouth daily. , Disp: , Rfl:  .  DULoxetine (CYMBALTA) 60 MG capsule, Take 1 capsule (60 mg total) by mouth every morning., Disp: 30 capsule, Rfl: 0 .  letrozole (FEMARA) 2.5 MG tablet, Take 1 tablet (2.5 mg total) by mouth daily., Disp: 30 tablet, Rfl: 0 .  ondansetron (ZOFRAN) 4 MG tablet, Take 1 tablet (4 mg total) by mouth every 6 (six) hours as needed for nausea or vomiting., Disp: 30 tablet, Rfl: 0 .  palbociclib (IBRANCE) 100 MG capsule, Take 100 mg by mouth daily with breakfast. Take whole with food. Take for 21 days on, 7 days off, repeat every 28 days., Disp: , Rfl:  .  pantoprazole (PROTONIX) 40 MG tablet, Take 1 tablet (40 mg total) by mouth 2 (two) times daily., Disp: 60 tablet, Rfl: 0 .  polyethylene glycol (MIRALAX / GLYCOLAX) packet, Take 17 g by mouth at bedtime., Disp: , Rfl:  .  senna (SENOKOT) 8.6 MG TABS tablet, Take 2 tablets by mouth 2 (two) times daily., Disp: , Rfl:  .  SYNTHROID 88 MCG tablet, Take 1 tablet (88 mcg total) by mouth daily before breakfast., Disp: 30 tablet, Rfl: 0 .  traMADol (ULTRAM) 50 MG tablet, Take 1 tablet (50 mg total) by mouth every 6 (six) hours as needed for severe pain., Disp: 15 tablet, Rfl: 0 .  traZODone (DESYREL) 100 MG tablet, Take 1 tablet (100 mg total) by mouth at bedtime., Disp: 30 tablet, Rfl: 0  Physical exam:  Vitals:   05/17/18 1113 05/17/18 1120  BP:  129/76  Pulse:  76  Resp: 12   Temp:  (!) 97.3 F (36.3 C)  TempSrc:  Tympanic  Weight: 175 lb 6.4 oz (79.6 kg)   Height: _0  (1.676 m)    Physical  Exam Constitutional:      General: She is not  in acute distress. HENT:     Head: Normocephalic and atraumatic.  Eyes:     Pupils: Pupils are equal, round, and reactive to light.  Neck:     Musculoskeletal: Normal range of motion.  Cardiovascular:     Rate and Rhythm: Normal rate and regular rhythm.     Heart sounds: Normal heart sounds.  Pulmonary:     Effort: Pulmonary effort is normal.     Breath sounds: Normal breath sounds.  Abdominal:     General: Bowel sounds are normal.     Palpations: Abdomen is soft.  Skin:    General: Skin is warm and dry.  Neurological:     Mental Status: She is alert and oriented to person, place, and time.      CMP Latest Ref Rng & Units 05/17/2018  Glucose 70 - 99 mg/dL 101(H)  BUN 8 - 23 mg/dL 9  Creatinine 0.44 - 1.00 mg/dL 0.89  Sodium 135 - 145 mmol/L 137  Potassium 3.5 - 5.1 mmol/L 3.5  Chloride 98 - 111 mmol/L 107  CO2 22 - 32 mmol/L 26  Calcium 8.9 - 10.3 mg/dL 7.6(L)  Total Protein 6.5 - 8.1 g/dL 6.9  Total Bilirubin 0.3 - 1.2 mg/dL 0.6  Alkaline Phos 38 - 126 U/L 53  AST 15 - 41 U/L 18  ALT 0 - 44 U/L 11   CBC Latest Ref Rng & Units 05/17/2018  WBC 4.0 - 10.5 K/uL 4.1  Hemoglobin 12.0 - 15.0 g/dL 10.2(L)  Hematocrit 36.0 - 46.0 % 30.7(L)  Platelets 150 - 400 K/uL 145(L)    No images are attached to the encounter.  Ct Chest W Contrast  Result Date: 05/16/2018 CLINICAL DATA:  History of metastatic breast cancer. EXAM: CT CHEST, ABDOMEN, AND PELVIS WITH CONTRAST TECHNIQUE: Multidetector CT imaging of the chest, abdomen and pelvis was performed following the standard protocol during bolus administration of intravenous contrast. CONTRAST:  141m OMNIPAQUE IOHEXOL 300 MG/ML  SOLN COMPARISON:  CT CAP 01/01/2018 FINDINGS: CT CHEST FINDINGS Cardiovascular: Normal heart size. Trace fluid superior pericardial recess. Thoracic aortic vascular calcifications. Mediastinum/Nodes: Postsurgical changes right axilla. No left axillary adenopathy.  Interval decrease in size of 0.6 cm left pericardiophrenic node (image 49; series 2), previously 0.8 cm. Similar to mild decrease in size of 4 mm left internal mammary node (image 26; series 2). Previously described prevascular lymph node not demonstrated on current exam. No new or enlarging mediastinal adenopathy. Moderate-sized hiatal hernia. Lungs/Pleura: Central airways are patent. Similar 4 mm nodule along the left fissure (image 75; series 4). Scarring/atelectasis within the lingula, unchanged. Similar-appearing atelectasis/scarring within the medial lower lobes bilaterally. Stable left upper lobe calcified granuloma. Interval resolution of trace left pleural effusion. Interval decrease in size of left pleural nodularity. Previously described reference left pleural nodule measures 4 mm in thickness (image 49; series 2), previously 6 mm. No new or enlarging pleural based nodularity. Musculoskeletal: No aggressive or acute appearing osseous lesions. Prior right mastectomy. CT ABDOMEN PELVIS FINDINGS Hepatobiliary: Liver is normal in size and contour. No focal lesion is identified. Prior cholecystectomy. Pancreas: Unremarkable Spleen: Unremarkable Adrenals/Urinary Tract: Normal adrenal glands. Kidneys enhance symmetrically with contrast. No hydronephrosis. Urinary bladder is unremarkable. Stomach/Bowel: Stool throughout the colon. No abnormal bowel wall thickening or evidence for bowel obstruction. Prior ascending colectomy. No evidence for bowel obstruction. No free fluid or free intraperitoneal air. Vascular/Lymphatic: Normal caliber abdominal aorta. Peripheral calcified atherosclerotic plaque. No retroperitoneal lymphadenopathy. Unchanged 1 cm upper abdominal lymph node associated with  the left diaphragmatic crus (image 56; series 2). Reproductive: Prior hysterectomy. Other: None. Musculoskeletal: Left hip arthroplasty. Lumbar spine degenerative changes. No aggressive or acute appearing osseous lesions.  IMPRESSION: 1. Interval decrease in size of pericardiophrenic lymph node, left pleural effusion and left pleural based nodularity. Similar-appearing left internal mammary lymph node. No new or progressive metastatic disease. Electronically Signed   By: Lovey Newcomer M.D.   On: 05/16/2018 12:22   Ct Abdomen Pelvis W Contrast  Result Date: 05/16/2018 CLINICAL DATA:  History of metastatic breast cancer. EXAM: CT CHEST, ABDOMEN, AND PELVIS WITH CONTRAST TECHNIQUE: Multidetector CT imaging of the chest, abdomen and pelvis was performed following the standard protocol during bolus administration of intravenous contrast. CONTRAST:  117m OMNIPAQUE IOHEXOL 300 MG/ML  SOLN COMPARISON:  CT CAP 01/01/2018 FINDINGS: CT CHEST FINDINGS Cardiovascular: Normal heart size. Trace fluid superior pericardial recess. Thoracic aortic vascular calcifications. Mediastinum/Nodes: Postsurgical changes right axilla. No left axillary adenopathy. Interval decrease in size of 0.6 cm left pericardiophrenic node (image 49; series 2), previously 0.8 cm. Similar to mild decrease in size of 4 mm left internal mammary node (image 26; series 2). Previously described prevascular lymph node not demonstrated on current exam. No new or enlarging mediastinal adenopathy. Moderate-sized hiatal hernia. Lungs/Pleura: Central airways are patent. Similar 4 mm nodule along the left fissure (image 75; series 4). Scarring/atelectasis within the lingula, unchanged. Similar-appearing atelectasis/scarring within the medial lower lobes bilaterally. Stable left upper lobe calcified granuloma. Interval resolution of trace left pleural effusion. Interval decrease in size of left pleural nodularity. Previously described reference left pleural nodule measures 4 mm in thickness (image 49; series 2), previously 6 mm. No new or enlarging pleural based nodularity. Musculoskeletal: No aggressive or acute appearing osseous lesions. Prior right mastectomy. CT ABDOMEN PELVIS  FINDINGS Hepatobiliary: Liver is normal in size and contour. No focal lesion is identified. Prior cholecystectomy. Pancreas: Unremarkable Spleen: Unremarkable Adrenals/Urinary Tract: Normal adrenal glands. Kidneys enhance symmetrically with contrast. No hydronephrosis. Urinary bladder is unremarkable. Stomach/Bowel: Stool throughout the colon. No abnormal bowel wall thickening or evidence for bowel obstruction. Prior ascending colectomy. No evidence for bowel obstruction. No free fluid or free intraperitoneal air. Vascular/Lymphatic: Normal caliber abdominal aorta. Peripheral calcified atherosclerotic plaque. No retroperitoneal lymphadenopathy. Unchanged 1 cm upper abdominal lymph node associated with the left diaphragmatic crus (image 56; series 2). Reproductive: Prior hysterectomy. Other: None. Musculoskeletal: Left hip arthroplasty. Lumbar spine degenerative changes. No aggressive or acute appearing osseous lesions. IMPRESSION: 1. Interval decrease in size of pericardiophrenic lymph node, left pleural effusion and left pleural based nodularity. Similar-appearing left internal mammary lymph node. No new or progressive metastatic disease. Electronically Signed   By: DLovey NewcomerM.D.   On: 05/16/2018 12:22     Assessment and plan- Patient is a 78y.o. 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 Low volume disease.She is on letrozole and Ibrance. She is here for on treatment assessment prior to cycle 8 of Ibrance and letrozole  I have reviewed CT chest abdomen and pelvis images independently and discussed findings with the patient. She [place] to have good [place]  There is interval decrease in the size of her pericardiophrenic lymph nodes as well as left pleural-based nodularity.  No new findings of metastatic disease.  Patient will continue Ibrance and letrozole at this time.  Her counts are otherwise stable to proceed with her next cycle of  Ibrance  She will return to clinic in 4  weeks for labs only CBC with differential and CMP.  I will see her back in 8 weeks with CBC with differential, CMP, CA-27-29 and CA-15-3 prior to cycle 9 of Ibrance.   Visit Diagnosis 1. Metastatic breast cancer ([place])   2. High risk medication use      Dr. Randa Evens, MD, MPH [facility] [phone] 05/20/2018 10:14 AM

## 2018-05-22 DIAGNOSIS — H903 Sensorineural hearing loss, bilateral: Secondary | ICD-10-CM | POA: Diagnosis not present

## 2018-05-29 DIAGNOSIS — D701 Agranulocytosis secondary to cancer chemotherapy: Secondary | ICD-10-CM | POA: Diagnosis not present

## 2018-05-29 DIAGNOSIS — R739 Hyperglycemia, unspecified: Secondary | ICD-10-CM | POA: Diagnosis not present

## 2018-05-29 DIAGNOSIS — E034 Atrophy of thyroid (acquired): Secondary | ICD-10-CM | POA: Diagnosis not present

## 2018-05-29 DIAGNOSIS — T451X5A Adverse effect of antineoplastic and immunosuppressive drugs, initial encounter: Secondary | ICD-10-CM | POA: Diagnosis not present

## 2018-05-29 DIAGNOSIS — I10 Essential (primary) hypertension: Secondary | ICD-10-CM | POA: Diagnosis not present

## 2018-06-05 DIAGNOSIS — M62562 Muscle wasting and atrophy, not elsewhere classified, left lower leg: Secondary | ICD-10-CM | POA: Diagnosis not present

## 2018-06-05 DIAGNOSIS — I10 Essential (primary) hypertension: Secondary | ICD-10-CM | POA: Diagnosis not present

## 2018-06-05 DIAGNOSIS — M79605 Pain in left leg: Secondary | ICD-10-CM | POA: Diagnosis not present

## 2018-06-11 MED FILL — IBRANCE 100 MG CAPSULE: 100 | 28 days supply | Qty: 21 | Fill #3

## 2018-06-12 ENCOUNTER — Other Ambulatory Visit: Payer: Self-pay | Admitting: Psychiatry

## 2018-06-14 ENCOUNTER — Inpatient Hospital Stay: Payer: PPO | Attending: Oncology

## 2018-06-14 ENCOUNTER — Other Ambulatory Visit: Payer: Self-pay

## 2018-06-14 DIAGNOSIS — C50919 Malignant neoplasm of unspecified site of unspecified female breast: Secondary | ICD-10-CM | POA: Insufficient documentation

## 2018-06-14 LAB — CBC WITH DIFFERENTIAL/PLATELET
Abs Immature Granulocytes: 0.01 10*3/uL (ref 0.00–0.07)
Basophils Absolute: 0 10*3/uL (ref 0.0–0.1)
Basophils Relative: 1 %
Eosinophils Absolute: 0 10*3/uL (ref 0.0–0.5)
Eosinophils Relative: 1 %
HCT: 32.9 % — ABNORMAL LOW (ref 36.0–46.0)
Hemoglobin: 10.9 g/dL — ABNORMAL LOW (ref 12.0–15.0)
Immature Granulocytes: 0 %
Lymphocytes Relative: 46 %
Lymphs Abs: 1.7 10*3/uL (ref 0.7–4.0)
MCH: 32.6 pg (ref 26.0–34.0)
MCHC: 33.1 g/dL (ref 30.0–36.0)
MCV: 98.5 fL (ref 80.0–100.0)
Monocytes Absolute: 0.3 10*3/uL (ref 0.1–1.0)
Monocytes Relative: 8 %
Neutro Abs: 1.6 10*3/uL — ABNORMAL LOW (ref 1.7–7.7)
Neutrophils Relative %: 44 %
Platelets: 128 10*3/uL — ABNORMAL LOW (ref 150–400)
RBC: 3.34 MIL/uL — ABNORMAL LOW (ref 3.87–5.11)
RDW: 16 % — ABNORMAL HIGH (ref 11.5–15.5)
WBC: 3.7 10*3/uL — ABNORMAL LOW (ref 4.0–10.5)
nRBC: 0 % (ref 0.0–0.2)

## 2018-06-14 LAB — COMPREHENSIVE METABOLIC PANEL
ALT: 12 U/L (ref 0–44)
AST: 19 U/L (ref 15–41)
Albumin: 3.9 g/dL (ref 3.5–5.0)
Alkaline Phosphatase: 54 U/L (ref 38–126)
Anion gap: 6 (ref 5–15)
BUN: 13 mg/dL (ref 8–23)
CO2: 24 mmol/L (ref 22–32)
Calcium: 8.7 mg/dL — ABNORMAL LOW (ref 8.9–10.3)
Chloride: 106 mmol/L (ref 98–111)
Creatinine, Ser: 1 mg/dL (ref 0.44–1.00)
GFR calc Af Amer: >60 mL/min (ref >60)
GFR calc non Af Amer: 54 mL/min — ABNORMAL LOW (ref >60)
Glucose, Bld: 97 mg/dL (ref 70–99)
Potassium: 3.9 mmol/L (ref 3.5–5.1)
Sodium: 136 mmol/L (ref 135–145)
Total Bilirubin: 0.5 mg/dL (ref 0.3–1.2)
Total Protein: 7 g/dL (ref 6.5–8.1)

## 2018-06-17 ENCOUNTER — Telehealth: Payer: Self-pay | Admitting: *Deleted

## 2018-06-17 NOTE — Telephone Encounter (Signed)
I had called late Friday and got voicemail. The daughter called me back today and said that they cancelled the nerve study. They are thinking about brace. Since it is a nerve conduction study it is ok to do that. I told daughter that her Arthur was almost normal but not quite there. If patient was going to have a procedure we would want her to have the anc be normal but the nerve conduction study is not invasive so if she needs it she is ok. Daughter or patient will call ortho office about a brace or nerve conduction study update.

## 2018-07-03 DIAGNOSIS — I10 Essential (primary) hypertension: Secondary | ICD-10-CM | POA: Diagnosis not present

## 2018-07-03 DIAGNOSIS — F3342 Major depressive disorder, recurrent, in full remission: Secondary | ICD-10-CM | POA: Diagnosis not present

## 2018-07-03 DIAGNOSIS — M5136 Other intervertebral disc degeneration, lumbar region: Secondary | ICD-10-CM | POA: Diagnosis not present

## 2018-07-03 DIAGNOSIS — D701 Agranulocytosis secondary to cancer chemotherapy: Secondary | ICD-10-CM | POA: Diagnosis not present

## 2018-07-03 DIAGNOSIS — R739 Hyperglycemia, unspecified: Secondary | ICD-10-CM | POA: Diagnosis not present

## 2018-07-03 DIAGNOSIS — K219 Gastro-esophageal reflux disease without esophagitis: Secondary | ICD-10-CM | POA: Diagnosis not present

## 2018-07-03 DIAGNOSIS — M898X6 Other specified disorders of bone, lower leg: Secondary | ICD-10-CM | POA: Diagnosis not present

## 2018-07-03 DIAGNOSIS — E785 Hyperlipidemia, unspecified: Secondary | ICD-10-CM | POA: Diagnosis not present

## 2018-07-03 DIAGNOSIS — C50919 Malignant neoplasm of unspecified site of unspecified female breast: Secondary | ICD-10-CM | POA: Diagnosis not present

## 2018-07-03 DIAGNOSIS — E034 Atrophy of thyroid (acquired): Secondary | ICD-10-CM | POA: Diagnosis not present

## 2018-07-03 DIAGNOSIS — H353211 Exudative age-related macular degeneration, right eye, with active choroidal neovascularization: Secondary | ICD-10-CM | POA: Diagnosis not present

## 2018-07-03 DIAGNOSIS — C7802 Secondary malignant neoplasm of left lung: Secondary | ICD-10-CM | POA: Diagnosis not present

## 2018-07-03 DIAGNOSIS — G4733 Obstructive sleep apnea (adult) (pediatric): Secondary | ICD-10-CM | POA: Diagnosis not present

## 2018-07-04 DIAGNOSIS — R200 Anesthesia of skin: Secondary | ICD-10-CM | POA: Diagnosis not present

## 2018-07-04 DIAGNOSIS — R202 Paresthesia of skin: Secondary | ICD-10-CM | POA: Diagnosis not present

## 2018-07-10 ENCOUNTER — Telehealth: Payer: Self-pay | Admitting: Pharmacy Technician

## 2018-07-10 DIAGNOSIS — R29898 Other symptoms and signs involving the musculoskeletal system: Secondary | ICD-10-CM | POA: Diagnosis not present

## 2018-07-10 DIAGNOSIS — G629 Polyneuropathy, unspecified: Secondary | ICD-10-CM | POA: Diagnosis not present

## 2018-07-10 NOTE — Telephone Encounter (Signed)
Oral Oncology Patient Advocate Encounter  Prior Authorization for Ibrance (tablets) has been approved.    PA# [id number] Effective dates: 07/10/2018 through 03/13/2019.  Patients co-pay is $609.17.  Patient currently has grant funding that covers high copay.  Oral Oncology Clinic will continue to follow.   [place] Patient [place] Phone [phone] Fax [phone] 07/10/2018 3:07 PM

## 2018-07-10 NOTE — Telephone Encounter (Signed)
Oral Oncology Patient Advocate Encounter  Received notification from [employer] that prior authorization for Ibrance (tablets) is required.  PA submitted on CoverMyMeds Key [id number] Status is pending  Oral Oncology Clinic will continue to follow.  Breanna Zhang Patient [place] Phone [phone] Fax [phone] 07/10/2018 10:26 AM

## 2018-07-11 ENCOUNTER — Other Ambulatory Visit: Payer: Self-pay | Admitting: *Deleted

## 2018-07-11 ENCOUNTER — Other Ambulatory Visit: Payer: Self-pay

## 2018-07-11 DIAGNOSIS — C50919 Malignant neoplasm of unspecified site of unspecified female breast: Secondary | ICD-10-CM

## 2018-07-11 MED FILL — IBRANCE 100 MG CAPSULE: 100 | 28 days supply | Qty: 21 | Fill #4

## 2018-07-12 ENCOUNTER — Other Ambulatory Visit: Payer: Self-pay

## 2018-07-12 ENCOUNTER — Inpatient Hospital Stay: Payer: PPO | Attending: Oncology

## 2018-07-12 ENCOUNTER — Other Ambulatory Visit: Payer: PPO

## 2018-07-12 ENCOUNTER — Inpatient Hospital Stay: Payer: PPO | Admitting: Oncology

## 2018-07-12 DIAGNOSIS — G4733 Obstructive sleep apnea (adult) (pediatric): Secondary | ICD-10-CM | POA: Diagnosis not present

## 2018-07-12 DIAGNOSIS — I4891 Unspecified atrial fibrillation: Secondary | ICD-10-CM | POA: Insufficient documentation

## 2018-07-12 DIAGNOSIS — I10 Essential (primary) hypertension: Secondary | ICD-10-CM | POA: Insufficient documentation

## 2018-07-12 DIAGNOSIS — E079 Disorder of thyroid, unspecified: Secondary | ICD-10-CM | POA: Insufficient documentation

## 2018-07-12 DIAGNOSIS — C50911 Malignant neoplasm of unspecified site of right female breast: Secondary | ICD-10-CM | POA: Diagnosis not present

## 2018-07-12 DIAGNOSIS — Z79899 Other long term (current) drug therapy: Secondary | ICD-10-CM | POA: Insufficient documentation

## 2018-07-12 DIAGNOSIS — Z170 Estrogen receptor positive status [ER+]: Secondary | ICD-10-CM | POA: Insufficient documentation

## 2018-07-12 DIAGNOSIS — Z79811 Long term (current) use of aromatase inhibitors: Secondary | ICD-10-CM | POA: Insufficient documentation

## 2018-07-12 DIAGNOSIS — C782 Secondary malignant neoplasm of pleura: Secondary | ICD-10-CM | POA: Diagnosis not present

## 2018-07-12 DIAGNOSIS — Z9011 Acquired absence of right breast and nipple: Secondary | ICD-10-CM | POA: Diagnosis not present

## 2018-07-12 DIAGNOSIS — Z86718 Personal history of other venous thrombosis and embolism: Secondary | ICD-10-CM | POA: Diagnosis not present

## 2018-07-12 DIAGNOSIS — C50919 Malignant neoplasm of unspecified site of unspecified female breast: Secondary | ICD-10-CM

## 2018-07-12 DIAGNOSIS — F329 Major depressive disorder, single episode, unspecified: Secondary | ICD-10-CM | POA: Diagnosis not present

## 2018-07-12 DIAGNOSIS — F419 Anxiety disorder, unspecified: Secondary | ICD-10-CM | POA: Insufficient documentation

## 2018-07-12 LAB — CBC WITH DIFFERENTIAL/PLATELET
Abs Immature Granulocytes: 0.01 10*3/uL (ref 0.00–0.07)
Basophils Absolute: 0.1 10*3/uL (ref 0.0–0.1)
Basophils Relative: 1 %
Eosinophils Absolute: 0 10*3/uL (ref 0.0–0.5)
Eosinophils Relative: 1 %
HCT: 31.4 % — ABNORMAL LOW (ref 36.0–46.0)
Hemoglobin: 10.5 g/dL — ABNORMAL LOW (ref 12.0–15.0)
Immature Granulocytes: 0 %
Lymphocytes Relative: 40 %
Lymphs Abs: 1.6 10*3/uL (ref 0.7–4.0)
MCH: 32.8 pg (ref 26.0–34.0)
MCHC: 33.4 g/dL (ref 30.0–36.0)
MCV: 98.1 fL (ref 80.0–100.0)
Monocytes Absolute: 0.4 10*3/uL (ref 0.1–1.0)
Monocytes Relative: 10 %
Neutro Abs: 1.9 10*3/uL (ref 1.7–7.7)
Neutrophils Relative %: 48 %
Platelets: 139 10*3/uL — ABNORMAL LOW (ref 150–400)
RBC: 3.2 MIL/uL — ABNORMAL LOW (ref 3.87–5.11)
RDW: 14.9 % (ref 11.5–15.5)
WBC: 4 10*3/uL (ref 4.0–10.5)
nRBC: 0 % (ref 0.0–0.2)

## 2018-07-12 LAB — COMPREHENSIVE METABOLIC PANEL
ALT: 11 U/L (ref 0–44)
AST: 20 U/L (ref 15–41)
Albumin: 3.7 g/dL (ref 3.5–5.0)
Alkaline Phosphatase: 48 U/L (ref 38–126)
Anion gap: 7 (ref 5–15)
BUN: 11 mg/dL (ref 8–23)
CO2: 27 mmol/L (ref 22–32)
Calcium: 8.4 mg/dL — ABNORMAL LOW (ref 8.9–10.3)
Chloride: 106 mmol/L (ref 98–111)
Creatinine, Ser: 0.91 mg/dL (ref 0.44–1.00)
GFR calc Af Amer: >60 mL/min (ref >60)
GFR calc non Af Amer: >60 mL/min (ref >60)
Glucose, Bld: 121 mg/dL — ABNORMAL HIGH (ref 70–99)
Potassium: 3.9 mmol/L (ref 3.5–5.1)
Sodium: 140 mmol/L (ref 135–145)
Total Bilirubin: 0.5 mg/dL (ref 0.3–1.2)
Total Protein: 6.7 g/dL (ref 6.5–8.1)

## 2018-07-13 LAB — CANCER ANTIGEN 15-3: CA 15-3: 32 U/mL — ABNORMAL HIGH (ref 0.0–25.0)

## 2018-07-13 LAB — CANCER ANTIGEN 27.29: CA 27.29: 47.7 U/mL — ABNORMAL HIGH (ref 0.0–38.6)

## 2018-07-15 ENCOUNTER — Other Ambulatory Visit: Payer: Self-pay | Admitting: Internal Medicine

## 2018-07-15 DIAGNOSIS — D701 Agranulocytosis secondary to cancer chemotherapy: Secondary | ICD-10-CM | POA: Diagnosis not present

## 2018-07-15 DIAGNOSIS — C7802 Secondary malignant neoplasm of left lung: Secondary | ICD-10-CM | POA: Diagnosis not present

## 2018-07-15 DIAGNOSIS — M5136 Other intervertebral disc degeneration, lumbar region: Secondary | ICD-10-CM | POA: Diagnosis not present

## 2018-07-15 DIAGNOSIS — G4733 Obstructive sleep apnea (adult) (pediatric): Secondary | ICD-10-CM | POA: Diagnosis not present

## 2018-07-15 DIAGNOSIS — R948 Abnormal results of function studies of other organs and systems: Secondary | ICD-10-CM

## 2018-07-15 DIAGNOSIS — C50919 Malignant neoplasm of unspecified site of unspecified female breast: Secondary | ICD-10-CM | POA: Diagnosis not present

## 2018-07-15 DIAGNOSIS — E034 Atrophy of thyroid (acquired): Secondary | ICD-10-CM | POA: Diagnosis not present

## 2018-07-15 DIAGNOSIS — F3342 Major depressive disorder, recurrent, in full remission: Secondary | ICD-10-CM | POA: Diagnosis not present

## 2018-07-15 DIAGNOSIS — M62562 Muscle wasting and atrophy, not elsewhere classified, left lower leg: Secondary | ICD-10-CM

## 2018-07-15 DIAGNOSIS — E785 Hyperlipidemia, unspecified: Secondary | ICD-10-CM | POA: Diagnosis not present

## 2018-07-15 DIAGNOSIS — M76822 Posterior tibial tendinitis, left leg: Secondary | ICD-10-CM | POA: Diagnosis not present

## 2018-07-15 DIAGNOSIS — M659 Synovitis and tenosynovitis, unspecified: Secondary | ICD-10-CM | POA: Diagnosis not present

## 2018-07-15 DIAGNOSIS — M25572 Pain in left ankle and joints of left foot: Secondary | ICD-10-CM | POA: Diagnosis not present

## 2018-07-15 DIAGNOSIS — I10 Essential (primary) hypertension: Secondary | ICD-10-CM | POA: Diagnosis not present

## 2018-07-15 DIAGNOSIS — K219 Gastro-esophageal reflux disease without esophagitis: Secondary | ICD-10-CM | POA: Diagnosis not present

## 2018-07-15 DIAGNOSIS — R739 Hyperglycemia, unspecified: Secondary | ICD-10-CM | POA: Diagnosis not present

## 2018-07-15 DIAGNOSIS — M898X6 Other specified disorders of bone, lower leg: Secondary | ICD-10-CM

## 2018-07-16 ENCOUNTER — Encounter: Payer: Self-pay | Admitting: Oncology

## 2018-07-16 ENCOUNTER — Ambulatory Visit: Payer: PPO | Admitting: Oncology

## 2018-07-16 ENCOUNTER — Inpatient Hospital Stay (HOSPITAL_BASED_OUTPATIENT_CLINIC_OR_DEPARTMENT_OTHER): Payer: PPO | Admitting: Oncology

## 2018-07-16 DIAGNOSIS — C50919 Malignant neoplasm of unspecified site of unspecified female breast: Secondary | ICD-10-CM | POA: Diagnosis not present

## 2018-07-16 DIAGNOSIS — Z79899 Other long term (current) drug therapy: Secondary | ICD-10-CM

## 2018-07-16 DIAGNOSIS — Z79811 Long term (current) use of aromatase inhibitors: Secondary | ICD-10-CM | POA: Diagnosis not present

## 2018-07-16 DIAGNOSIS — Z9221 Personal history of antineoplastic chemotherapy: Secondary | ICD-10-CM

## 2018-07-16 NOTE — Progress Notes (Signed)
Patient states she has peripheral neuropathy- it started over a month ago with an indention in her ankle left and it got worse and her fingers are numb. One doctor told her that she ha peripheral neuropathy in her leg and she had boot made for her and that has helped some. 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 Her fingers have big knuckles and she can's ball her fist up and therfore can't hold things as well. She will start a prednisone taper today. Her pain is 5 in her back and it gets worse when moving around. This is her off week with the ibrance

## 2018-07-18 NOTE — Progress Notes (Addendum)
I connected with Breanna Zhang on 07/18/18 at 11:15 AM EDT by video enabled telemedicine visit and verified that I am speaking with the correct person using two identifiers.  I tried to connect patient with video visit. Patient had difficulty hearing and seeing me. Therefore I had to switch to telephone call for remainder of conversation   I discussed the limitations, risks, security and privacy concerns of performing an evaluation and management service by telemedicine and the availability of in-person appointments. I also discussed with the patient that there may be a patient responsible charge related to this service. The patient expressed understanding and agreed to proceed.  Other persons participating in the visit and their role in the encounter:  none  Patient's location:  home Provider's location:  home  Chief Complaint:  Routine f/u of metastatic breast cancer on ibrance and letrozole  History of present illness: patient is a 78 year old female with a past medical history significant for right breast cancer in 1995. She underwent mastectomy followed by adjuvant chemotherapy and 5 years of tamoxifen. Patient has been having some low back pain and underwent a CT lumbar spine without contrast on 08/10/2017 which was done by the pain clinic. CT mainly showed age-related degenerative disease but also showed incidental nodular thickening of the posterior left diaphragm concerning for tumor and a CT chest was recommended.  CT chest on 08/14/2017 showed scattered left pleural nodularity with tiny left pleural effusion and prominent left internal mammary and juxtadiaphragmatic lymph nodes which are nonspecific but metastatic disease could not be ruled out.  This was followed by a PET/CT scan on 08/22/2017 which showed numerous small left-sided pleural nodules which were mildly hypermetabolic along with hypermetabolic mediastinal lymph nodes concerning for metastatic disease. Small metastatic nodule  in the left upper quadrant partly in the retrocrural space. No findings of pulmonary metastatic or osseous metastatic disease.   Patient underwent FNA of retrocrural LN which was positive for carcinoma but primary could not be ascertained. 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 Patient started taking Ibrance in August 2019  Omniseq testing showedPIKCA and BRCA mutation  Scans have shown stable to improved disease so far   Interval history she has back pain and weakness and tingling in her rightleg. She was seen by nuerology   Review of Systems  Constitutional: Negative for chills, fever, malaise/fatigue and weight loss.  HENT: Negative for congestion, ear discharge and nosebleeds.   Eyes: Negative for blurred vision.  Respiratory: Negative for cough, hemoptysis, sputum production, shortness of breath and wheezing.   Cardiovascular: Negative for chest pain, palpitations, orthopnea and claudication.  Gastrointestinal: Negative for abdominal pain, blood in stool, constipation, diarrhea, heartburn, melena, nausea and vomiting.  Genitourinary: Negative for dysuria, flank pain, frequency, hematuria and urgency.  Musculoskeletal: Positive for back pain and joint pain. Negative for myalgias.  Skin: Negative for rash.  Neurological: Negative for dizziness, tingling, focal weakness, seizures, weakness and headaches.  Endo/Heme/Allergies: Does not bruise/bleed easily.  Psychiatric/Behavioral: Negative for depression and suicidal ideas. The patient does not have insomnia.     Allergies  Allergen Reactions  . Imipramine Tinitus  . Latex Rash  . Penicillins Rash  . Tape Rash    Past Medical History:  Diagnosis Date  . Anxiety   . Atrial fibrillation ([place])   . Breast cancer ([place]) 1999   RT MASTECTOMY and chemo tx's.   . DDD (degenerative disc disease), cervical    hands and back with  arthritis  . Depression    . Diverticulosis   . Diverticulosis   . DVT of lower extremity (deep venous thrombosis) ([facility])   . Dyspnea   . Family history of colon cancer   . Family history of lung cancer   . Family history of throat cancer   . GERD (gastroesophageal reflux disease)   . Goiter   . Headache   . History of chemotherapy 2000   BREAST CA  . Hypertension   . Incontinence   . Neuromuscular disorder ([facility])    peipheral neuropathy  . OSA (obstructive sleep apnea)   . Osteoporosis   . Personal history of chemotherapy 1999   BREAST CA  . Status post chemotherapy    2000 right breast cancer  . Thyroid disease   . UTI (urinary tract infection)   . Vertigo     Past Surgical History:  Procedure Laterality Date  . ABDOMINAL HYSTERECTOMY    . BREAST BIOPSY Left 2006   negative  . BUNIONECTOMY Bilateral    Screws implanted.  . CHOLECYSTECTOMY    . COCHLEAR IMPLANT Right   . COLONOSCOPY WITH PROPOFOL N/A 07/24/2016   Procedure: COLONOSCOPY WITH PROPOFOL;  Surgeon: Elliott, Robert T, MD;  Location: [facility];  Service: Endoscopy;  Laterality: N/A;  . ESOPHAGOGASTRODUODENOSCOPY (EGD) WITH PROPOFOL N/A 04/12/2016   Procedure: ESOPHAGOGASTRODUODENOSCOPY (EGD) WITH PROPOFOL;  Surgeon: Robert T Elliott, MD;  Location: [facility] END[facility];  Service: Endoscopy;  Laterality: N/A;  . EYE SURGERY Bilateral    cataract  . FOOT SURGERY Bilateral   . HAND SURGERY Right   . MASTECTOMY Right 1999   BREAST CA  . MASTECTOMY Right 1999  . PARTIAL COLECTOMY    . THYROIDECTOMY    . TOTAL HIP ARTHROPLASTY Left 03/19/2018   Procedure: TOTAL HIP ARTHROPLASTY LEFT;  Surgeon: Menz, Michael, MD;  Location: [facility] OR[facility];  Service: Orthopedics;  Laterality: Left;  . VIDEO ASSISTED THORACOSCOPY Left 09/10/2017   Procedure: VIDEO ASSISTED THORACOSCOPY;  Surgeon: Oaks, Timothy, MD;  Location: [facility] OR[facility];  Service: Thoracic;  Laterality: Left;  with biopsies  . VIDEO BRONCHOSCOPY  09/10/2017   Procedure: VIDEO BRONCHOSCOPY;  Surgeon:  Oaks, Timothy, MD;  Location: [facility];  Service: Thoracic;;    Social History   Socioeconomic History  . Marital status: Married    Spouse name: evert  . Number of children: 4  . Years of education: Not on file  . Highest education level: 9th grade  Occupational History  . Not on file  Social Needs  . Financial resource strain: Somewhat hard  . Food insecurity:    Worry: Often true    Inability: Often true  . Transportation needs:    Medical: No    Non-medical: No  Tobacco Use  . Smoking status: Never Smoker  . Smokeless tobacco: Never Used  Substance and Sexual Activity  . Alcohol use: No    Alcohol/week: 0.0 standard drinks  . Drug use: No  . Sexual activity: Not Currently  Lifestyle  . Physical activity:    Days per week: 0 days    Minutes per session: 0 min  . Stress: Not at all  Relationships  . Social connections:    Talks on phone: Not on file    Gets together: Not on file    Attends religious service: More than 4 times per year    Active member of club or organization: No    Attends meetings of clubs or organizations: Never    Relationship   status: Married  . Intimate partner violence:    Fear of current or ex partner: No    Emotionally abused: No    Physically abused: No    Forced sexual activity: No  Other Topics Concern  . Not on file  Social History Narrative  . Not on file    Family History  Problem Relation Age of Onset  . Dementia Mother   . Dementia Sister   . Diabetes Sister   . Heart attack Brother 3  . COPD Brother   . Lung cancer Brother        hx smoking  . Colon cancer Brother   . Liver cancer Brother   . COPD Brother   . Lung cancer Brother        hx smoking  . Colon cancer Maternal Grandfather        dx >.50  . Breast cancer Neg Hx      Current Outpatient Medications:  .  acetaminophen (TYLENOL) 325 MG tablet, Take 650 mg by mouth every 4 (four) hours as needed. For pain / increased temp.  May be administered orally,   per G-Tube if needed or rectally if unable to swallow (separate order).  Maximum dose for 24 hours is 3,000 mg from all sources of Acetaminophen / Tylenol, Disp: , Rfl:  .  atorvastatin (LIPITOR) 40 MG tablet, Take 1 tablet (40 mg total) by mouth at bedtime., Disp: 30 tablet, Rfl: 0 .  busPIRone (BUSPAR) 10 MG tablet, TAKE 1 TABLET BY MOUTH EVERY DAY, Disp: 90 tablet, Rfl: 2 .  cholecalciferol (VITAMIN D) 1000 units tablet, Take 1,000 Units by mouth daily., Disp: , Rfl:  .  cyanocobalamin (V-R VITAMIN B-12) 500 MCG tablet, Take 500 mcg by mouth daily. , Disp: , Rfl:  .  DULoxetine (CYMBALTA) 60 MG capsule, Take 1 capsule (60 mg total) by mouth every morning., Disp: 30 capsule, Rfl: 0 .  letrozole (FEMARA) 2.5 MG tablet, Take 1 tablet (2.5 mg total) by mouth daily., Disp: 30 tablet, Rfl: 0 .  ondansetron (ZOFRAN) 4 MG tablet, Take 1 tablet (4 mg total) by mouth every 6 (six) hours as needed for nausea or vomiting., Disp: 30 tablet, Rfl: 0 .  palbociclib (IBRANCE) 100 MG capsule, Take 100 mg by mouth daily with breakfast. Take whole with food. Take for 21 days on, 7 days off, repeat every 28 days., Disp: , Rfl:  .  pantoprazole (PROTONIX) 40 MG tablet, Take 1 tablet (40 mg total) by mouth 2 (two) times daily., Disp: 60 tablet, Rfl: 0 .  polyethylene glycol (MIRALAX / GLYCOLAX) packet, Take 17 g by mouth at bedtime., Disp: , Rfl:  .  predniSONE (STERAPRED UNI-PAK 21 TAB) 5 MG (21) TBPK tablet, Take 60 mg by mouth daily. Decrease by 10 mg each day til complete, Disp: , Rfl:  .  senna (SENOKOT) 8.6 MG TABS tablet, Take 2 tablets by mouth 2 (two) times daily., Disp: , Rfl:  .  SYNTHROID 88 MCG tablet, Take 1 tablet (88 mcg total) by mouth daily before breakfast., Disp: 30 tablet, Rfl: 0 .  traMADol (ULTRAM) 50 MG tablet, Take 1 tablet (50 mg total) by mouth every 6 (six) hours as needed for severe pain., Disp: 15 tablet, Rfl: 0 .  traZODone (DESYREL) 100 MG tablet, Take 1 tablet (100 mg total) by mouth at  bedtime., Disp: 30 tablet, Rfl: 0  No results found.  No images are attached to the encounter.   CMP Latest Ref  Rng & Units 07/12/2018  Glucose 70 - 99 mg/dL 121(H)  BUN 8 - 23 mg/dL 11  Creatinine 0.44 - 1.00 mg/dL 0.91  Sodium 135 - 145 mmol/L 140  Potassium 3.5 - 5.1 mmol/L 3.9  Chloride 98 - 111 mmol/L 106  CO2 22 - 32 mmol/L 27  Calcium 8.9 - 10.3 mg/dL 8.4(L)  Total Protein 6.5 - 8.1 g/dL 6.7  Total Bilirubin 0.3 - 1.2 mg/dL 0.5  Alkaline Phos 38 - 126 U/L 48  AST 15 - 41 U/L 20  ALT 0 - 44 U/L 11   CBC Latest Ref Rng & Units 07/12/2018  WBC 4.0 - 10.5 K/uL 4.0  Hemoglobin 12.0 - 15.0 g/dL 10.5(L)  Hematocrit 36.0 - 46.0 % 31.4(L)  Platelets 150 - 400 K/uL 139(L)      Assessment and plan:Patient is a 77 yr old female with Er + metastatic breast cancer low volume disease with small pleural nodules. She is currently on ibrance and letrozole and this is a routine f/u visit  Counts remain stable on ibrance. Tumor markers stable- neither increasing nor decreasing. CT scans in march showed stable disease. Continue ibrance and letrozole  Patient found to have polyneuropathy in her feet. Being followed by neuroology. Ibrance typically does not lead to neuropathy symptoms. I therefore do not think this is related to ibrance or letrozole  Follow-up instructions: I will see her on 5/29 in my office with labs: cbc with diff/cmp ca 27.29 and ca 15-3. Scans thereafter  I discussed the assessment and treatment plan with the patient. The patient was provided an opportunity to ask questions and all were answered. The patient agreed with the plan and demonstrated an understanding of the instructions.   The patient was advised to call back or seek an in-person evaluation if the symptoms worsen or if the condition fails to improve as anticipated.    Visit Diagnosis: 1. Metastatic breast cancer (HCC)   2. High risk medication use     Dr.  , MD, MPH [facility] Pager- [phone] 07/18/2018 12:52 PM  

## 2018-07-31 DIAGNOSIS — H353221 Exudative age-related macular degeneration, left eye, with active choroidal neovascularization: Secondary | ICD-10-CM | POA: Diagnosis not present

## 2018-08-02 ENCOUNTER — Other Ambulatory Visit: Payer: Self-pay

## 2018-08-02 ENCOUNTER — Encounter
Admission: RE | Admit: 2018-08-02 | Discharge: 2018-08-02 | Disposition: A | Discharge status: Home / Self Care | Payer: PPO | Source: Ambulatory Visit | Attending: Internal Medicine | Admitting: Internal Medicine

## 2018-08-02 DIAGNOSIS — Z853 Personal history of malignant neoplasm of breast: Secondary | ICD-10-CM | POA: Diagnosis not present

## 2018-08-02 DIAGNOSIS — M62562 Muscle wasting and atrophy, not elsewhere classified, left lower leg: Secondary | ICD-10-CM | POA: Diagnosis not present

## 2018-08-02 DIAGNOSIS — M898X6 Other specified disorders of bone, lower leg: Secondary | ICD-10-CM | POA: Diagnosis not present

## 2018-08-02 DIAGNOSIS — R948 Abnormal results of function studies of other organs and systems: Secondary | ICD-10-CM | POA: Insufficient documentation

## 2018-08-02 MED ORDER — TECHNETIUM TC 99M MEDRONATE IV KIT
20.4600 | PACK | Freq: Once | INTRAVENOUS | Status: AC | PRN
  Administered 2018-08-02: 09:00:00 20.46 via INTRAVENOUS

## 2018-08-06 DIAGNOSIS — M65871 Other synovitis and tenosynovitis, right ankle and foot: Secondary | ICD-10-CM | POA: Diagnosis not present

## 2018-08-06 DIAGNOSIS — M76822 Posterior tibial tendinitis, left leg: Secondary | ICD-10-CM | POA: Diagnosis not present

## 2018-08-08 ENCOUNTER — Other Ambulatory Visit: Payer: Self-pay

## 2018-08-08 MED FILL — IBRANCE 100 MG CAPSULE: 100 | 28 days supply | Qty: 21 | Fill #5

## 2018-08-09 ENCOUNTER — Inpatient Hospital Stay: Payer: PPO

## 2018-08-09 ENCOUNTER — Other Ambulatory Visit: Payer: Self-pay

## 2018-08-09 ENCOUNTER — Inpatient Hospital Stay: Payer: PPO | Admitting: Oncology

## 2018-08-09 ENCOUNTER — Inpatient Hospital Stay (HOSPITAL_BASED_OUTPATIENT_CLINIC_OR_DEPARTMENT_OTHER): Payer: PPO | Admitting: Oncology

## 2018-08-09 VITALS — BP 139/87 | HR 74 | Temp 97.6°F | Resp 20 | Ht 66.0 in | Wt 181.0 lb

## 2018-08-09 DIAGNOSIS — C50919 Malignant neoplasm of unspecified site of unspecified female breast: Secondary | ICD-10-CM

## 2018-08-09 DIAGNOSIS — Z170 Estrogen receptor positive status [ER+]: Secondary | ICD-10-CM | POA: Diagnosis not present

## 2018-08-09 DIAGNOSIS — G4733 Obstructive sleep apnea (adult) (pediatric): Secondary | ICD-10-CM | POA: Diagnosis not present

## 2018-08-09 DIAGNOSIS — C50911 Malignant neoplasm of unspecified site of right female breast: Secondary | ICD-10-CM | POA: Diagnosis not present

## 2018-08-09 DIAGNOSIS — Z79899 Other long term (current) drug therapy: Secondary | ICD-10-CM

## 2018-08-09 DIAGNOSIS — Z79811 Long term (current) use of aromatase inhibitors: Secondary | ICD-10-CM | POA: Diagnosis not present

## 2018-08-09 DIAGNOSIS — C782 Secondary malignant neoplasm of pleura: Secondary | ICD-10-CM

## 2018-08-09 DIAGNOSIS — Z9011 Acquired absence of right breast and nipple: Secondary | ICD-10-CM | POA: Diagnosis not present

## 2018-08-09 DIAGNOSIS — D702 Other drug-induced agranulocytosis: Secondary | ICD-10-CM

## 2018-08-09 LAB — COMPREHENSIVE METABOLIC PANEL
ALT: 12 U/L (ref 0–44)
AST: 22 U/L (ref 15–41)
Albumin: 3.7 g/dL (ref 3.5–5.0)
Alkaline Phosphatase: 53 U/L (ref 38–126)
Anion gap: 8 (ref 5–15)
BUN: 9 mg/dL (ref 8–23)
CO2: 27 mmol/L (ref 22–32)
Calcium: 8.5 mg/dL — ABNORMAL LOW (ref 8.9–10.3)
Chloride: 103 mmol/L (ref 98–111)
Creatinine, Ser: 0.91 mg/dL (ref 0.44–1.00)
GFR calc Af Amer: >60 mL/min (ref >60)
GFR calc non Af Amer: >60 mL/min (ref >60)
Glucose, Bld: 139 mg/dL — ABNORMAL HIGH (ref 70–99)
Potassium: 4 mmol/L (ref 3.5–5.1)
Sodium: 138 mmol/L (ref 135–145)
Total Bilirubin: 0.5 mg/dL (ref 0.3–1.2)
Total Protein: 6.7 g/dL (ref 6.5–8.1)

## 2018-08-09 LAB — CBC WITH DIFFERENTIAL/PLATELET
Abs Immature Granulocytes: 0 10*3/uL (ref 0.00–0.07)
Basophils Absolute: 0 10*3/uL (ref 0.0–0.1)
Basophils Relative: 1 %
Eosinophils Absolute: 0.1 10*3/uL (ref 0.0–0.5)
Eosinophils Relative: 3 %
HCT: 31.6 % — ABNORMAL LOW (ref 36.0–46.0)
Hemoglobin: 10.6 g/dL — ABNORMAL LOW (ref 12.0–15.0)
Lymphocytes Relative: 39 %
Lymphs Abs: 1.1 10*3/uL (ref 0.7–4.0)
MCH: 33.7 pg (ref 26.0–34.0)
MCHC: 33.5 g/dL (ref 30.0–36.0)
MCV: 100.3 fL — ABNORMAL HIGH (ref 80.0–100.0)
Monocytes Absolute: 0.1 10*3/uL (ref 0.1–1.0)
Monocytes Relative: 5 %
Neutro Abs: 1.5 10*3/uL — ABNORMAL LOW (ref 1.7–7.7)
Neutrophils Relative %: 52 %
Platelets: 120 10*3/uL — ABNORMAL LOW (ref 150–400)
RBC: 3.15 MIL/uL — ABNORMAL LOW (ref 3.87–5.11)
RDW: 14.4 % (ref 11.5–15.5)
Smear Review: NORMAL
WBC: 2.9 10*3/uL — ABNORMAL LOW (ref 4.0–10.5)
nRBC: 0 % (ref 0.0–0.2)

## 2018-08-10 LAB — CANCER ANTIGEN 27.29: CA 27.29: 36 U/mL (ref 0.0–38.6)

## 2018-08-10 LAB — CANCER ANTIGEN 15-3: CA 15-3: 32.9 U/mL — ABNORMAL HIGH (ref 0.0–25.0)

## 2018-08-12 ENCOUNTER — Encounter: Payer: Self-pay | Admitting: Oncology

## 2018-08-12 NOTE — Progress Notes (Signed)
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 She underwent mastectomy followed by adjuvant chemotherapy and 5 years of tamoxifen. Patient has been having some low back pain and underwent a CT lumbar spine without contrast on 08/10/2017 which was done by the pain clinic. CT mainly showed age-related degenerative disease but also showed incidental nodular thickening of the posterior left diaphragm concerning for tumor and a CT chest was recommended.  CT chest on 08/14/2017 showed scattered left pleural nodularity with tiny left pleural effusion and prominent left internal mammary and juxtadiaphragmatic lymph nodes which are nonspecific but metastatic disease could not be ruled out.  This was followed by a PET/CT scan on 08/22/2017 which showed numerous small left-sided pleural nodules which were mildly hypermetabolic along with hypermetabolic mediastinal lymph nodes concerning for metastatic disease. Small metastatic nodule in the left upper quadrant partly in the retrocrural space. No findings of pulmonary metastatic or osseous metastatic disease.   Patient underwent FNA of retrocrural LN which was positive for carcinoma but primary could not be ascertained. She then  underwent pleural biopsy which showed metastatic carcinoma consistent with breast primary. ER+HER-2. Patient started taking Ibrance in August 2019  RBC testing showedPIKCA and BRCA mutation  Scans have shown stable to improved disease so far   Interval history-she continues to have pain in her left ankle possibly secondary to arthritis and currently has a compression wrap in place  ECOG PS- 1 Pain scale- 0 Opioid associated constipation- no  Review of systems- Review of Systems  Constitutional: Positive for malaise/fatigue. Negative for chills, fever and weight loss.  HENT: Negative for congestion, ear discharge and nosebleeds.   Eyes: Negative for blurred vision.  Respiratory: Negative for cough, hemoptysis, sputum production, shortness of breath and wheezing.   Cardiovascular: Negative for chest pain, palpitations, orthopnea and claudication.  Gastrointestinal: Negative for abdominal pain, blood in stool, constipation, diarrhea, heartburn, melena, nausea and vomiting.  Genitourinary: Negative for dysuria, flank pain, frequency, hematuria and urgency.  Musculoskeletal: Positive for joint pain (Right hip pain and left ankle pain). Negative for back pain and myalgias.  Skin: Negative for rash.  Neurological: Negative for dizziness, tingling, focal weakness, seizures, weakness and headaches.  Endo/Heme/Allergies: Does not bruise/bleed easily.  Psychiatric/Behavioral: Negative for depression and suicidal ideas. The patient does not have insomnia.       Allergies  Allergen Reactions   Imipramine Tinitus   Latex Rash   Penicillins Rash   Tape Rash     Past Medical History:  Diagnosis Date   Anxiety    Atrial fibrillation (HCC)    Breast cancer ([place]) 1999   RT MASTECTOMY and chemo tx's.    DDD (degenerative disc disease), cervical    hands and back with arthritis   Depression    Diverticulosis    Diverticulosis    DVT of  lower extremity (deep venous  thrombosis) ([place])    Dyspnea    Family history of colon cancer    Family history of lung cancer    Family history of throat cancer    GERD (gastroesophageal reflux disease)    Goiter    Headache    History of chemotherapy 2000   BREAST CA   Hypertension    Incontinence    Neuromuscular disorder ([place])    peipheral neuropathy   OSA (obstructive sleep apnea)    Osteoporosis    Personal history of chemotherapy 1999   BREAST CA   Status post chemotherapy    2000 right breast cancer   Thyroid disease    UTI (urinary tract infection)    Vertigo      Past Surgical History:  Procedure Laterality Date   ABDOMINAL HYSTERECTOMY     BREAST BIOPSY Left 2006   negative   BUNIONECTOMY Bilateral    Screws implanted.   CHOLECYSTECTOMY     COCHLEAR IMPLANT Right    COLONOSCOPY WITH PROPOFOL N/A 07/24/2016   Procedure: COLONOSCOPY WITH PROPOFOL;  Surgeon: Manya Silvas, MD;  Location[facility] ENDOSCOPY;  Service: Endoscopy;  Laterality: N/A;   ESOPHAGOGASTRODUODENOSCOPY (EGD) WITH PROPOFOL N/A 04/12/2016   Procedure: ESOPHAGOGASTRODUODENOSCOPY (EGD) WITH PROPOFOL;  Surgeon: Manya Silvas, MD;  Location: [facility] [place] ENDOSCOPY;  Service: Endoscopy;  Laterality: N/A;   EYE SURGERY Bilateral    cataract   FOOT SURGERY Bilateral    HAND SURGERY Right    MASTECTOMY Right 1999   BREAST CA   MASTECTOMY Right 1999   PARTIAL COLECTOMY     THYROIDECTOMY     TOTAL HIP ARTHROPLASTY Left 03/19/2018   Procedure: TOTAL HIP ARTHROPLASTY LEFT;  Surgeon: Hessie Knows, MD;  Location: [facility] ORS;  Service: Orthopedics;  Laterality: Left;   VIDEO ASSISTED THORACOSCOPY Left 09/10/2017   Procedure: VIDEO ASSISTED THORACOSCOPY;  Surgeon: [name], MD;  Location: [facility] ORS;  Service: Thoracic;  Laterality: Left;  with biopsies   VIDEO BRONCHOSCOPY  09/10/2017   Procedure: VIDEO BRONCHOSCOPY;  Surgeon: [name], MD;  Location: [facility] ORS;  Service: Thoracic;;    Social History     Socioeconomic History   Marital status: Married    Spouse name: evert   Number of children: 4   Years of education: Not on file   Highest education level: 9th grade  Occupational History   Not on file  Social Needs   Financial resource strain: Somewhat hard   Food insecurity:    Worry: Often true    Inability: Often true   Transportation needs:    Medical: No    Non-medical: No  Tobacco Use   Smoking status: Never Smoker   Smokeless tobacco: Never Used  Substance and Sexual Activity   Alcohol use: No    Alcohol/week: 0.0 standard drinks   Drug use: No   Sexual activity: Not Currently  Lifestyle   Physical activity:    Days per week: 0 days    Minutes per session: 0 min   Stress: Not at all  Relationships   Social connections:    Talks on phone: Not on file    Gets together: Not on file    Attends religious service: More than 4 times per year    Active member of club or organization: No    Attends meetings of clubs or organizations: Never    Relationship status: Married   Intimate partner violence:    Fear of current or ex  partner: No    Emotionally abused: No    Physically abused: No    Forced sexual activity: No  Other Topics Concern   Not on file  Social History Narrative   Not on file    Family History  Problem Relation Age of Onset   Dementia Mother    Dementia Sister    Diabetes Sister    Heart attack Brother 71   COPD Brother    Lung cancer Brother        hx smoking   Colon cancer Brother    Liver cancer Brother    COPD Brother    Lung cancer Brother        hx smoking   Colon cancer Maternal Grandfather        dx >.50   Breast cancer Neg Hx      Current Outpatient Medications:    acetaminophen (TYLENOL) 325 MG tablet, Take 650 mg by mouth every 4 (four) hours as needed. For pain / increased temp.  May be administered orally,  per G-Tube if needed or rectally if unable to swallow (separate order).  Maximum  dose for 24 hours is 3,000 mg from all sources of Acetaminophen / Tylenol, Disp: , Rfl:    atorvastatin (LIPITOR) 40 MG tablet, Take 1 tablet (40 mg total) by mouth at bedtime., Disp: 30 tablet, Rfl: 0   cholecalciferol (VITAMIN D) 1000 units tablet, Take 1,000 Units by mouth daily., Disp: , Rfl:    cyanocobalamin (V-R VITAMIN B-12) 500 MCG tablet, Take 500 mcg by mouth daily. , Disp: , Rfl:    DULoxetine (CYMBALTA) 60 MG capsule, Take 1 capsule (60 mg total) by mouth every morning., Disp: 30 capsule, Rfl: 0   gabapentin (NEURONTIN) 100 MG capsule, Take 1 capsule by mouth daily., Disp: , Rfl:    levothyroxine (SYNTHROID) 100 MCG tablet, Take 1 tablet by mouth daily., Disp: , Rfl:    ondansetron (ZOFRAN) 4 MG tablet, Take 1 tablet (4 mg total) by mouth every 6 (six) hours as needed for nausea or vomiting., Disp: 30 tablet, Rfl: 0   palbociclib (IBRANCE) 100 MG capsule, Take 100 mg by mouth daily with breakfast. Take whole with food. Take for 21 days on, 7 days off, repeat every 28 days., Disp: , Rfl:    pantoprazole (PROTONIX) 40 MG tablet, Take 1 tablet (40 mg total) by mouth 2 (two) times daily., Disp: 60 tablet, Rfl: 0   senna (SENOKOT) 8.6 MG TABS tablet, Take 2 tablets by mouth 2 (two) times daily., Disp: , Rfl:    traZODone (DESYREL) 100 MG tablet, Take 1 tablet (100 mg total) by mouth at bedtime., Disp: 30 tablet, Rfl: 0   polyethylene glycol (MIRALAX / GLYCOLAX) packet, Take 17 g by mouth at bedtime., Disp: , Rfl:   Physical exam:  Vitals:   08/09/18 0852  BP: 139/87  Pulse: 74  Resp: 20  Temp: 97.6 F (36.4 C)  TempSrc: Tympanic  Weight: 181 lb (82.1 kg)  Height: 5' 6"  (1.676 m)   Physical Exam Constitutional:      General: She is not in acute distress. HENT:     Head: Normocephalic and atraumatic.  Eyes:     Pupils: Pupils are equal, round, and reactive to light.  Neck:     Musculoskeletal: Normal range of motion.  Cardiovascular:     Rate and Rhythm: Normal  rate and regular rhythm.     Heart sounds: Normal heart sounds.  Pulmonary:  Effort: Pulmonary effort is normal.     Breath sounds: Normal breath sounds.  Abdominal:     General: Bowel sounds are normal.     Palpations: Abdomen is soft.  Musculoskeletal:     Comments: Right ankle as compression wrap in place  Skin:    General: Skin is warm and dry.  Neurological:     Mental Status: She is alert and oriented to person, place, and time.      CMP Latest Ref Rng & Units 08/09/2018  Glucose 70 - 99 mg/dL 139(H)  BUN 8 - 23 mg/dL 9  Creatinine 0.44 - 1.00 mg/dL 0.91  Sodium 135 - 145 mmol/L 138  Potassium 3.5 - 5.1 mmol/L 4.0  Chloride 98 - 111 mmol/L 103  CO2 22 - 32 mmol/L 27  Calcium 8.9 - 10.3 mg/dL 8.5(L)  Total Protein 6.5 - 8.1 g/dL 6.7  Total Bilirubin 0.3 - 1.2 mg/dL 0.5  Alkaline Phos 38 - 126 U/L 53  AST 15 - 41 U/L 22  ALT 0 - 44 U/L 12   CBC Latest Ref Rng & Units 08/09/2018  WBC 4.0 - 10.5 K/uL 2.9(L)  Hemoglobin 12.0 - 15.0 g/dL 10.6(L)  Hematocrit 36.0 - 46.0 % 31.6(L)  Platelets 150 - 400 K/uL 120(L)    No images are attached to the encounter.  Nm Bone Scan Whole Body  Result Date: 08/02/2018 CLINICAL DATA:  History of breast cancer. EXAM: NUCLEAR MEDICINE WHOLE BODY BONE SCAN TECHNIQUE: Whole body anterior and posterior images were obtained approximately 3 hours after intravenous injection of radiopharmaceutical. RADIOPHARMACEUTICALS:  20.46 mCi Technetium-44mMDP IV COMPARISON:  Bone scan of January 01, 2018. FINDINGS: Status post left hip arthroplasty. Stable uptake is noted around the left knee including proximal tibia, most consistent with degenerative change. Abnormal uptake is noted in both feet, left greater than right, consistent with degenerative change. Stable left-sided focus of uptake seen in cervical spine consistent with degenerative change no new foci of abnormal uptake are noted. IMPRESSION: No definite scintigraphic evidence of osseous  metastases. Abnormal uptake is seen in multiple areas which is stable compared to prior exam and most consistent with degenerative change. Electronically Signed   By: JMarijo ConceptionM.D.   On: 08/02/2018 13:31     Assessment and plan- Patient is a 78y.o. female  With the ER positive metastatic breast cancer mainly with pleural nodules on Ibrance and letrozole.  This is a routine follow-up visit on Ibrance and letrozole.    Patient's white count is 2.9 today with an ANC of 1.5.  Hemoglobin is stable around 10 and platelets are mildly low at 120 which is at her baseline.  Given that her ATetlinis greater than 1 she can continue with present dose of Ibrance at 100 mg daily.  She is taking Ibrance 3 weeks on and one-week off.  She also takes letrozole daily.  Left ankle pain: Likely secondary to arthritis and not related to Ibrance.  Bone scan also showed no evidence of metastatic disease she has been following up with Dr. BRamonita Labas well as orthopedics for this    Return to clinic in 4 weeks.  CBC with differential, CMP, CA 15 3 and CA-27-29 to be checked on that day.  Need CT chest abdomen and pelvis with contrast prior   Visit Diagnosis 1. High risk medication use   2. Drug-induced neutropenia (HCC)   3. Metastatic breast cancer ([facility]      Dr. ARanda Evens  MD, MPH [facility] [phone] 08/12/2018 9:49 AM

## 2018-08-22 ENCOUNTER — Other Ambulatory Visit: Payer: Self-pay | Admitting: Adult Health

## 2018-08-22 DIAGNOSIS — H903 Sensorineural hearing loss, bilateral: Secondary | ICD-10-CM | POA: Diagnosis not present

## 2018-08-28 DIAGNOSIS — H353211 Exudative age-related macular degeneration, right eye, with active choroidal neovascularization: Secondary | ICD-10-CM | POA: Diagnosis not present

## 2018-09-02 ENCOUNTER — Other Ambulatory Visit: Payer: Self-pay | Admitting: Oncology

## 2018-09-04 ENCOUNTER — Other Ambulatory Visit: Payer: Self-pay

## 2018-09-04 ENCOUNTER — Ambulatory Visit
Admission: RE | Admit: 2018-09-04 | Discharge: 2018-09-04 | Disposition: A | Discharge status: Home / Self Care | Payer: PPO | Source: Ambulatory Visit | Attending: Oncology | Admitting: Oncology

## 2018-09-04 DIAGNOSIS — C50919 Malignant neoplasm of unspecified site of unspecified female breast: Secondary | ICD-10-CM

## 2018-09-04 DIAGNOSIS — R918 Other nonspecific abnormal finding of lung field: Secondary | ICD-10-CM | POA: Diagnosis not present

## 2018-09-04 DIAGNOSIS — Z9049 Acquired absence of other specified parts of digestive tract: Secondary | ICD-10-CM | POA: Insufficient documentation

## 2018-09-04 DIAGNOSIS — K449 Diaphragmatic hernia without obstruction or gangrene: Secondary | ICD-10-CM | POA: Diagnosis not present

## 2018-09-04 DIAGNOSIS — R195 Other fecal abnormalities: Secondary | ICD-10-CM | POA: Diagnosis not present

## 2018-09-04 MED ORDER — IOHEXOL 300 MG/ML  SOLN
100.0000 mL | Freq: Once | INTRAMUSCULAR | Status: AC | PRN
  Administered 2018-09-04: 100 mL via INTRAVENOUS

## 2018-09-05 MED FILL — IBRANCE 100 MG CAPSULE: 100 | 28 days supply | Qty: 21 | Fill #0

## 2018-09-06 ENCOUNTER — Other Ambulatory Visit: Payer: Self-pay

## 2018-09-06 ENCOUNTER — Inpatient Hospital Stay (HOSPITAL_BASED_OUTPATIENT_CLINIC_OR_DEPARTMENT_OTHER): Payer: PPO | Admitting: Oncology

## 2018-09-06 ENCOUNTER — Inpatient Hospital Stay: Payer: PPO | Attending: Oncology

## 2018-09-06 VITALS — BP 143/79 | HR 80 | Temp 97.3°F | Resp 16 | Wt 177.9 lb

## 2018-09-06 DIAGNOSIS — Z170 Estrogen receptor positive status [ER+]: Secondary | ICD-10-CM | POA: Insufficient documentation

## 2018-09-06 DIAGNOSIS — C50912 Malignant neoplasm of unspecified site of left female breast: Secondary | ICD-10-CM

## 2018-09-06 DIAGNOSIS — E079 Disorder of thyroid, unspecified: Secondary | ICD-10-CM | POA: Diagnosis not present

## 2018-09-06 DIAGNOSIS — C50919 Malignant neoplasm of unspecified site of unspecified female breast: Secondary | ICD-10-CM

## 2018-09-06 DIAGNOSIS — Z79899 Other long term (current) drug therapy: Secondary | ICD-10-CM | POA: Diagnosis not present

## 2018-09-06 DIAGNOSIS — F419 Anxiety disorder, unspecified: Secondary | ICD-10-CM | POA: Diagnosis not present

## 2018-09-06 DIAGNOSIS — I10 Essential (primary) hypertension: Secondary | ICD-10-CM | POA: Diagnosis not present

## 2018-09-06 DIAGNOSIS — C782 Secondary malignant neoplasm of pleura: Secondary | ICD-10-CM

## 2018-09-06 DIAGNOSIS — G4733 Obstructive sleep apnea (adult) (pediatric): Secondary | ICD-10-CM | POA: Diagnosis not present

## 2018-09-06 DIAGNOSIS — Z79811 Long term (current) use of aromatase inhibitors: Secondary | ICD-10-CM | POA: Insufficient documentation

## 2018-09-06 DIAGNOSIS — Z86718 Personal history of other venous thrombosis and embolism: Secondary | ICD-10-CM | POA: Insufficient documentation

## 2018-09-06 DIAGNOSIS — C50911 Malignant neoplasm of unspecified site of right female breast: Secondary | ICD-10-CM | POA: Insufficient documentation

## 2018-09-06 DIAGNOSIS — Z9011 Acquired absence of right breast and nipple: Secondary | ICD-10-CM | POA: Insufficient documentation

## 2018-09-06 DIAGNOSIS — F329 Major depressive disorder, single episode, unspecified: Secondary | ICD-10-CM | POA: Diagnosis not present

## 2018-09-06 DIAGNOSIS — I4891 Unspecified atrial fibrillation: Secondary | ICD-10-CM | POA: Diagnosis not present

## 2018-09-06 LAB — COMPREHENSIVE METABOLIC PANEL
ALT: 12 U/L (ref 0–44)
AST: 22 U/L (ref 15–41)
Albumin: 4 g/dL (ref 3.5–5.0)
Alkaline Phosphatase: 48 U/L (ref 38–126)
Anion gap: 7 (ref 5–15)
BUN: 10 mg/dL (ref 8–23)
CO2: 26 mmol/L (ref 22–32)
Calcium: 8.6 mg/dL — ABNORMAL LOW (ref 8.9–10.3)
Chloride: 106 mmol/L (ref 98–111)
Creatinine, Ser: 1.12 mg/dL — ABNORMAL HIGH (ref 0.44–1.00)
GFR calc Af Amer: 55 mL/min — ABNORMAL LOW (ref >60)
GFR calc non Af Amer: 47 mL/min — ABNORMAL LOW (ref >60)
Glucose, Bld: 111 mg/dL — ABNORMAL HIGH (ref 70–99)
Potassium: 4.4 mmol/L (ref 3.5–5.1)
Sodium: 139 mmol/L (ref 135–145)
Total Bilirubin: 0.5 mg/dL (ref 0.3–1.2)
Total Protein: 7 g/dL (ref 6.5–8.1)

## 2018-09-06 LAB — CBC WITH DIFFERENTIAL/PLATELET
Abs Immature Granulocytes: 0.01 10*3/uL (ref 0.00–0.07)
Basophils Absolute: 0.1 10*3/uL (ref 0.0–0.1)
Basophils Relative: 1 %
Eosinophils Absolute: 0 10*3/uL (ref 0.0–0.5)
Eosinophils Relative: 1 %
HCT: 31.5 % — ABNORMAL LOW (ref 36.0–46.0)
Hemoglobin: 10.7 g/dL — ABNORMAL LOW (ref 12.0–15.0)
Immature Granulocytes: 0 %
Lymphocytes Relative: 48 %
Lymphs Abs: 2.1 10*3/uL (ref 0.7–4.0)
MCH: 34.2 pg — ABNORMAL HIGH (ref 26.0–34.0)
MCHC: 34 g/dL (ref 30.0–36.0)
MCV: 100.6 fL — ABNORMAL HIGH (ref 80.0–100.0)
Monocytes Absolute: 0.4 10*3/uL (ref 0.1–1.0)
Monocytes Relative: 10 %
Neutro Abs: 1.7 10*3/uL (ref 1.7–7.7)
Neutrophils Relative %: 40 %
Platelets: 151 10*3/uL (ref 150–400)
RBC: 3.13 MIL/uL — ABNORMAL LOW (ref 3.87–5.11)
RDW: 14.2 % (ref 11.5–15.5)
Smear Review: NORMAL
WBC: 4.3 10*3/uL (ref 4.0–10.5)
nRBC: 0 % (ref 0.0–0.2)

## 2018-09-06 NOTE — Progress Notes (Signed)
Pt here for follow up. Denies any concerns.  

## 2018-09-07 ENCOUNTER — Other Ambulatory Visit: Payer: Self-pay | Admitting: Adult Health

## 2018-09-07 ENCOUNTER — Other Ambulatory Visit: Payer: Self-pay | Admitting: Psychiatry

## 2018-09-07 LAB — CANCER ANTIGEN 15-3: CA 15-3: 33.3 U/mL — ABNORMAL HIGH (ref 0.0–25.0)

## 2018-09-07 LAB — CANCER ANTIGEN 27.29: CA 27.29: 53.2 U/mL — ABNORMAL HIGH (ref 0.0–38.6)

## 2018-09-09 ENCOUNTER — Other Ambulatory Visit: Payer: Self-pay | Admitting: *Deleted

## 2018-09-09 ENCOUNTER — Other Ambulatory Visit: Payer: Self-pay | Admitting: Oncology

## 2018-09-09 ENCOUNTER — Encounter: Payer: Self-pay | Admitting: Oncology

## 2018-09-09 NOTE — Progress Notes (Signed)
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  Discussed results of CT scan  Heme/Onc history:  patient is a 78 year old female with a past medical history significant for right breast cancer in 1995. She underwent mastectomy followed by adjuvant chemotherapy and 5 years of tamoxifen. Patient has been having some low back pain and underwent a CT lumbar spine without contrast on 08/10/2017 which was done by the pain clinic. CT mainly showed age-related degenerative disease but also showed incidental nodular thickening of the posterior left diaphragm concerning for tumor and a CT chest was recommended.  CT chest on 08/14/2017 showed scattered left pleural nodularity with tiny left pleural effusion and prominent left internal mammary and juxtadiaphragmatic lymph nodes which are nonspecific but metastatic disease could not be ruled out.  This was followed by a PET/CT scan on 08/22/2017 which showed numerous small left-sided pleural nodules which were mildly hypermetabolic along with hypermetabolic mediastinal lymph nodes concerning for metastatic disease. Small metastatic nodule in the left upper quadrant partly in the retrocrural space. No findings of pulmonary metastatic or osseous metastatic disease.   Patient underwent FNA of retrocrural LN which was positive for carcinoma but primary could  not be ascertained. 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 Patient started taking Ibrance in August 2019  RBC testing showedPIKCA and BRCA mutation  Scans have shown stable to improved disease so far  Interval history-she continues to have hip pain and ankle pain which has remained relatively stable.  She has chronic fatigue.  Denies other complaints at this time  ECOG PS- 1 Pain scale- 0   Review of systems- Review of Systems  Constitutional: Positive for malaise/fatigue. Negative for chills, fever and weight loss.  HENT: Negative for congestion, ear discharge and nosebleeds.   Eyes: Negative for blurred vision.  Respiratory: Negative for cough, hemoptysis, sputum production, shortness of breath and wheezing.   Cardiovascular: Negative for chest pain, palpitations, orthopnea and claudication.  Gastrointestinal: Negative for abdominal pain, blood in stool, constipation, diarrhea, heartburn, melena, nausea and vomiting.  Genitourinary: Negative for dysuria, flank pain, frequency, hematuria and urgency.  Musculoskeletal: Positive for joint pain (left hip pain and left ankle pain). Negative for back pain and myalgias.  Skin: Negative for rash.  Neurological: Negative for dizziness, tingling, focal weakness, seizures, weakness and headaches.  Endo/Heme/Allergies: Does not bruise/bleed easily.  Psychiatric/Behavioral: Negative for depression and suicidal ideas. The patient does not have insomnia.       Allergies  Allergen Reactions   Imipramine Tinitus   Latex Rash   Penicillins Rash   Tape Rash     Past Medical History:  Diagnosis Date   Anxiety    Atrial fibrillation (HCC)    Breast cancer ([place]) 1999   RT MASTECTOMY and chemo tx's.    DDD (degenerative disc disease), cervical    hands and back with arthritis   Depression    Diverticulosis  Diverticulosis    DVT of lower extremity  (deep venous thrombosis) ([place])    Dyspnea    Family history of colon cancer    Family history of lung cancer    Family history of throat cancer    GERD (gastroesophageal reflux disease)    Goiter    Headache    History of chemotherapy 2000   BREAST CA   Hypertension    Incontinence    Neuromuscular disorder ([place])    peipheral neuropathy   OSA (obstructive sleep apnea)    Osteoporosis    Personal history of chemotherapy 1999   BREAST CA   Status post chemotherapy    2000 right breast cancer   Thyroid disease    UTI (urinary tract infection)    Vertigo      Past Surgical History:  Procedure Laterality Date   ABDOMINAL HYSTERECTOMY     BREAST BIOPSY Left 2006   negative   BUNIONECTOMY Bilateral    Screws implanted.   CHOLECYSTECTOMY     COCHLEAR IMPLANT Right    COLONOSCOPY WITH PROPOFOL N/A 07/24/2016   Procedure: COLONOSCOPY WITH PROPOFOL;  Surgeon: Manya Silvas, MD;  Location: [facility] ENDOSCOPY;  Service: Endoscopy;  Laterality: N/A;   ESOPHAGOGASTRODUODENOSCOPY (EGD) WITH PROPOFOL N/A 04/12/2016   Procedure: ESOPHAGOGASTRODUODENOSCOPY (EGD) WITH PROPOFOL;  Surgeon: Manya Silvas, MD;  Location: [facility] ENDOSCOPY;  Service: Endoscopy;  Laterality: N/A;   EYE SURGERY Bilateral    cataract   FOOT SURGERY Bilateral    HAND SURGERY Right    MASTECTOMY Right 1999   BREAST CA   MASTECTOMY Right 1999   PARTIAL COLECTOMY     THYROIDECTOMY     TOTAL HIP ARTHROPLASTY Left 03/19/2018   Procedure: TOTAL HIP ARTHROPLASTY LEFT;  Surgeon: Hessie Knows, MD;  Location: [facility] ORS;  Service: Orthopedics;  Laterality: Left;   VIDEO ASSISTED THORACOSCOPY Left 09/10/2017   Procedure: VIDEO ASSISTED THORACOSCOPY;  Surgeon: [name], MD;  Location: [facility] ORS;  Service: Thoracic;  Laterality: Left;  with biopsies   VIDEO BRONCHOSCOPY  09/10/2017   Procedure: VIDEO BRONCHOSCOPY;  Surgeon: [name], MD;  Location: [facility] ORS;  Service: Thoracic;;     Social History   Socioeconomic History   Marital status: Married    Spouse name: evert   Number of children: 4   Years of education: Not on file   Highest education level: 9th grade  Occupational History   Not on file  Social Needs   Financial resource strain: Somewhat hard   Food insecurity    Worry: Often true    Inability: Often true   Transportation needs    Medical: No    Non-medical: No  Tobacco Use   Smoking status: Never Smoker   Smokeless tobacco: Never Used  Substance and Sexual Activity   Alcohol use: No    Alcohol/week: 0.0 standard drinks   Drug use: No   Sexual activity: Not Currently  Lifestyle   Physical activity    Days per week: 0 days    Minutes per session: 0 min   Stress: Not at all  Relationships   Social connections    Talks on phone: Not on file    Gets together: Not on file    Attends religious service: More than 4 times per year    Active member of club or organization: No    Attends meetings of clubs or organizations: Never    Relationship status: Married   Intimate partner violence  Fear of current or ex partner: No    Emotionally abused: No    Physically abused: No    Forced sexual activity: No  Other Topics Concern   Not on file  Social History Narrative   Not on file    Family History  Problem Relation Age of Onset   Dementia Mother    Dementia Sister    Diabetes Sister    Heart attack Brother 79   COPD Brother    Lung cancer Brother        hx smoking   Colon cancer Brother    Liver cancer Brother    COPD Brother    Lung cancer Brother        hx smoking   Colon cancer Maternal Grandfather        dx >.50   Breast cancer Neg Hx      Current Outpatient Medications:    acetaminophen (TYLENOL) 325 MG tablet, Take 650 mg by mouth every 4 (four) hours as needed. For pain / increased temp.  May be administered orally,  per G-Tube if needed or rectally if unable to swallow (separate  order).  Maximum dose for 24 hours is 3,000 mg from all sources of Acetaminophen / Tylenol, Disp: , Rfl:    atorvastatin (LIPITOR) 40 MG tablet, Take 1 tablet (40 mg total) by mouth at bedtime., Disp: 30 tablet, Rfl: 0   cholecalciferol (VITAMIN D) 1000 units tablet, Take 1,000 Units by mouth daily., Disp: , Rfl:    cyanocobalamin (V-R VITAMIN B-12) 500 MCG tablet, Take 500 mcg by mouth daily. , Disp: , Rfl:    DULoxetine (CYMBALTA) 60 MG capsule, Take 1 capsule (60 mg total) by mouth every morning., Disp: 30 capsule, Rfl: 0   gabapentin (NEURONTIN) 100 MG capsule, Take 1 capsule by mouth daily., Disp: , Rfl:    IBRANCE 100 MG capsule, TAKE 1 CAPSULE (100 MG TOTAL) BY MOUTH DAILY WITH BREAKFAST. TAKE WHOLE WITH FOOD. TAKE FOR 21 DAYS ON, 7 DAYS OFF, REPEAT EVERY 28 DAYS., Disp: 21 capsule, Rfl: 5   levothyroxine (SYNTHROID) 100 MCG tablet, Take 1 tablet by mouth daily., Disp: , Rfl:    ondansetron (ZOFRAN) 4 MG tablet, Take 1 tablet (4 mg total) by mouth every 6 (six) hours as needed for nausea or vomiting., Disp: 30 tablet, Rfl: 0   pantoprazole (PROTONIX) 40 MG tablet, Take 1 tablet (40 mg total) by mouth 2 (two) times daily., Disp: 60 tablet, Rfl: 0   polyethylene glycol (MIRALAX / GLYCOLAX) packet, Take 17 g by mouth at bedtime., Disp: , Rfl:    senna (SENOKOT) 8.6 MG TABS tablet, Take 2 tablets by mouth 2 (two) times daily., Disp: , Rfl:    sulfamethoxazole-trimethoprim (BACTRIM DS) 800-160 MG tablet, Take 1 tablet by mouth 2 (two) times daily. , Disp: , Rfl:    traZODone (DESYREL) 100 MG tablet, Take 1 tablet (100 mg total) by mouth at bedtime., Disp: 30 tablet, Rfl: 0  Physical exam:  Vitals:   09/06/18 1104  BP: (!) 143/79  Pulse: 80  Resp: 16  Temp: (!) 97.3 F (36.3 C)  TempSrc: Tympanic  Weight: 177 lb 14.4 oz (80.7 kg)   Physical Exam Constitutional:      General: She is not in acute distress. HENT:     Head: Normocephalic and atraumatic.  Eyes:     Pupils:  Pupils are equal, round, and reactive to light.  Neck:     Musculoskeletal: Normal range of motion.  Cardiovascular:     Rate and Rhythm: Normal rate and regular rhythm.     Heart sounds: Normal heart sounds.  Pulmonary:     Effort: Pulmonary effort is normal.     Breath sounds: Normal breath sounds.  Abdominal:     General: Bowel sounds are normal.     Palpations: Abdomen is soft.  Skin:    General: Skin is warm and dry.  Neurological:     Mental Status: She is alert and oriented to person, place, and time.      CMP Latest Ref Rng & Units 09/06/2018  Glucose 70 - 99 mg/dL 111(H)  BUN 8 - 23 mg/dL 10  Creatinine 0.44 - 1.00 mg/dL 1.12(H)  Sodium 135 - 145 mmol/L 139  Potassium 3.5 - 5.1 mmol/L 4.4  Chloride 98 - 111 mmol/L 106  CO2 22 - 32 mmol/L 26  Calcium 8.9 - 10.3 mg/dL 8.6(L)  Total Protein 6.5 - 8.1 g/dL 7.0  Total Bilirubin 0.3 - 1.2 mg/dL 0.5  Alkaline Phos 38 - 126 U/L 48  AST 15 - 41 U/L 22  ALT 0 - 44 U/L 12   CBC Latest Ref Rng & Units 09/06/2018  WBC 4.0 - 10.5 K/uL 4.3  Hemoglobin 12.0 - 15.0 g/dL 10.7(L)  Hematocrit 36.0 - 46.0 % 31.5(L)  Platelets 150 - 400 K/uL 151    No images are attached to the encounter.  Ct Chest W Contrast  Result Date: 09/04/2018 CLINICAL DATA:  Metastatic breast cancer with pleural nodules. Restaging assessment. EXAM: CT CHEST, ABDOMEN, AND PELVIS WITH CONTRAST TECHNIQUE: Multidetector CT imaging of the chest, abdomen and pelvis was performed following the standard protocol during bolus administration of intravenous contrast. CONTRAST:  130m OMNIPAQUE IOHEXOL 300 MG/ML  SOLN COMPARISON:  Multiple exams, including 05/16/2018 FINDINGS: CT CHEST FINDINGS Cardiovascular: Aortic and branch vessel atherosclerotic vascular calcifications. Calcification of the posterior mitral valve leaflet. Mediastinum/Nodes: Small type 1 hiatal hernia. Contrast medium in the esophagus suggesting dysmotility or reflux. Right mastectomy. Right axillary  dissection. Indistinct pericardial node near the diaphragm measures about 4 mm in short axis on image 49/2, minimally less prominent on 05/16/2018. Stable 4 mm in short axis left internal mammary node on image 25/2. Lungs/Pleura: Trace left pleural fluid posteriorly. Triangular pleural-based nodularity posteromedially along the right lower lobe measuring 0.4 by 0.6 cm, and by my measurements previously 1.0 by 0.7 cm on 05/16/2018. Minimal nodularity along the lateral margin of the left major fissure on image 41/5 measuring 1.1 by 0.6 cm today, previously 1.4 by 0.6 cm. Mild biapical pleuroparenchymal scarring. Mild lingular scarring. A nodule along the left major fissure measures 0.3 cm in short axis on image 80/5, stable. A suspected calcified pulmonary nodule measuring 3 mm in diameter is present on image 53/5 in the left upper lobe. Musculoskeletal: Thoracic spondylosis and mild thoracic kyphosis. CT ABDOMEN PELVIS FINDINGS Hepatobiliary: Cholecystectomy. Stable mild extrahepatic biliary dilatation, likely a physiologic response to the cholecystectomy. No liver lesion identified. Pancreas: Unremarkable Spleen: Unremarkable Adrenals/Urinary Tract: The adrenal glands and kidneys appear normal. Radiodensities along both sides of the urethra, possibly from prior urethral bulking. 0.8 cm density in the vicinity of the internal urethral orifice, stable, and not in a dependent position, this may also be related to urethral bulking and is less likely to be a bladder stone. Stomach/Bowel: Prominent stool throughout the colon favors constipation. Right hemicolectomy. Vascular/Lymphatic: Aortoiliac atherosclerotic vascular disease. No pathologic adenopathy. Reproductive: Uterus absent.  Adnexa unremarkable. Other: No supplemental non-categorized findings.  Musculoskeletal: Left hip prosthesis. No compelling findings of osseous metastatic disease. IMPRESSION: 1. Minimal and reduced left pleural nodularity compared to the  prior exam. Equivocal trace left pleural fluid. 2. No pathologic adenopathy or evidence of other distant spread. 3. Other imaging findings of potential clinical significance: Aortic Atherosclerosis (ICD10-I70.0). Mitral valve calcification. Type 1 hiatal hernia. Esophageal dysmotility versus reflux. Radiodensities along the urethra are probably from urethral bulking, correlate with procedural history. Prominent stool throughout the colon favors constipation. Right hemicolectomy. Electronically Signed   By: Van Clines M.D.   On: 09/04/2018 16:19   Ct Abdomen Pelvis W Contrast  Result Date: 09/04/2018 CLINICAL DATA:  Metastatic breast cancer with pleural nodules. Restaging assessment. EXAM: CT CHEST, ABDOMEN, AND PELVIS WITH CONTRAST TECHNIQUE: Multidetector CT imaging of the chest, abdomen and pelvis was performed following the standard protocol during bolus administration of intravenous contrast. CONTRAST:  170m OMNIPAQUE IOHEXOL 300 MG/ML  SOLN COMPARISON:  Multiple exams, including 05/16/2018 FINDINGS: CT CHEST FINDINGS Cardiovascular: Aortic and branch vessel atherosclerotic vascular calcifications. Calcification of the posterior mitral valve leaflet. Mediastinum/Nodes: Small type 1 hiatal hernia. Contrast medium in the esophagus suggesting dysmotility or reflux. Right mastectomy. Right axillary dissection. Indistinct pericardial node near the diaphragm measures about 4 mm in short axis on image 49/2, minimally less prominent on 05/16/2018. Stable 4 mm in short axis left internal mammary node on image 25/2. Lungs/Pleura: Trace left pleural fluid posteriorly. Triangular pleural-based nodularity posteromedially along the right lower lobe measuring 0.4 by 0.6 cm, and by my measurements previously 1.0 by 0.7 cm on 05/16/2018. Minimal nodularity along the lateral margin of the left major fissure on image 41/5 measuring 1.1 by 0.6 cm today, previously 1.4 by 0.6 cm. Mild biapical pleuroparenchymal  scarring. Mild lingular scarring. A nodule along the left major fissure measures 0.3 cm in short axis on image 80/5, stable. A suspected calcified pulmonary nodule measuring 3 mm in diameter is present on image 53/5 in the left upper lobe. Musculoskeletal: Thoracic spondylosis and mild thoracic kyphosis. CT ABDOMEN PELVIS FINDINGS Hepatobiliary: Cholecystectomy. Stable mild extrahepatic biliary dilatation, likely a physiologic response to the cholecystectomy. No liver lesion identified. Pancreas: Unremarkable Spleen: Unremarkable Adrenals/Urinary Tract: The adrenal glands and kidneys appear normal. Radiodensities along both sides of the urethra, possibly from prior urethral bulking. 0.8 cm density in the vicinity of the internal urethral orifice, stable, and not in a dependent position, this may also be related to urethral bulking and is less likely to be a bladder stone. Stomach/Bowel: Prominent stool throughout the colon favors constipation. Right hemicolectomy. Vascular/Lymphatic: Aortoiliac atherosclerotic vascular disease. No pathologic adenopathy. Reproductive: Uterus absent.  Adnexa unremarkable. Other: No supplemental non-categorized findings. Musculoskeletal: Left hip prosthesis. No compelling findings of osseous metastatic disease. IMPRESSION: 1. Minimal and reduced left pleural nodularity compared to the prior exam. Equivocal trace left pleural fluid. 2. No pathologic adenopathy or evidence of other distant spread. 3. Other imaging findings of potential clinical significance: Aortic Atherosclerosis (ICD10-I70.0). Mitral valve calcification. Type 1 hiatal hernia. Esophageal dysmotility versus reflux. Radiodensities along the urethra are probably from urethral bulking, correlate with procedural history. Prominent stool throughout the colon favors constipation. Right hemicolectomy. Electronically Signed   By: WVan ClinesM.D.   On: 09/04/2018 16:19     Assessment and plan- Patient is a 78y.o.  female with stage IV metastatic ER positive breast cancer low-volume disease with pleural nodules here for routine follow-up of breast cancer on Ibrance and letrozole as well as to discuss the results of  her CT scan  I have reviewed CT chest abdomen and pelvis images independently and discussed findings with the patient.  She continues to respond to [facility] and letrozole.  Pleural nodules appear smaller.  There are no new lesions seen at this time.  She is tolerating Ibrance and letrozole well except for ongoing fatigue.  Counts are okay to proceed with next cycle of Ibrance in 1 week's time.  I will see her back in 2 months time with CBC with differential, CMP and CA-15-3   Visit Diagnosis 1. High risk medication use   2. Malignant neoplasm of left female breast, unspecified estrogen receptor status, unspecified site of breast ([place])      Dr. Randa Evens, MD, MPH [facility] [phone] 09/09/2018 7:27 AM

## 2018-09-09 NOTE — Telephone Encounter (Signed)
This was refill today

## 2018-09-11 DIAGNOSIS — R399 Unspecified symptoms and signs involving the genitourinary system: Secondary | ICD-10-CM | POA: Diagnosis not present

## 2018-09-12 DIAGNOSIS — G4733 Obstructive sleep apnea (adult) (pediatric): Secondary | ICD-10-CM | POA: Diagnosis not present

## 2018-09-15 ENCOUNTER — Other Ambulatory Visit: Payer: Self-pay | Admitting: Oncology

## 2018-09-19 ENCOUNTER — Telehealth: Payer: Self-pay | Admitting: Pharmacy Technician

## 2018-09-19 NOTE — Telephone Encounter (Signed)
Oral Oncology Patient Advocate Encounter   Was successful in securing patient an $105 grant from Patient [facility]) to provide copayment coverage for Ibrance.  This will keep the out of pocket expense at $0.    The billing information is as follows and has been shared with [place].   Member ID: [id number] Group ID: [id number] RxBin: [id number] Dates of Eligibility: 09/18/2018 through 09/17/2019  [place] [facility] Phone [phone] Fax [phone] 09/19/2018 1:19 PM

## 2018-09-24 ENCOUNTER — Other Ambulatory Visit: Payer: Self-pay | Admitting: Pharmacist

## 2018-09-24 DIAGNOSIS — C50912 Malignant neoplasm of unspecified site of left female breast: Secondary | ICD-10-CM

## 2018-09-24 MED ORDER — PALBOCICLIB 100 MG PO TABS: 100.0000 mg | ORAL_TABLET | Freq: Every day | ORAL | 4 refills | Status: DC

## 2018-09-24 NOTE — Progress Notes (Signed)
Prescription for Ibrance TABLETS sent in to [place], capsule rx suspended.

## 2018-10-03 MED FILL — IBRANCE 100 MG TABS: 100 | 28 days supply | Qty: 21 | Fill #0

## 2018-10-08 ENCOUNTER — Telehealth: Payer: Self-pay

## 2018-10-08 NOTE — Telephone Encounter (Signed)
pt made appt for 10-14-18 but she needs refills on her medications to get her to that appt.

## 2018-10-08 NOTE — Telephone Encounter (Signed)
Breanna Zhang could you please find out whether she was taking her medications , since the last refill from Dr.Faheem was several months ago for 30 days . Please verify and let me know.thanks

## 2018-10-13 DIAGNOSIS — G4733 Obstructive sleep apnea (adult) (pediatric): Secondary | ICD-10-CM | POA: Diagnosis not present

## 2018-10-14 ENCOUNTER — Other Ambulatory Visit: Payer: Self-pay

## 2018-10-14 ENCOUNTER — Encounter: Payer: Self-pay | Admitting: Psychiatry

## 2018-10-14 ENCOUNTER — Ambulatory Visit (INDEPENDENT_AMBULATORY_CARE_PROVIDER_SITE_OTHER): Payer: PPO | Admitting: Psychiatry

## 2018-10-14 DIAGNOSIS — F0630 Mood disorder due to known physiological condition, unspecified: Secondary | ICD-10-CM | POA: Diagnosis not present

## 2018-10-14 MED ORDER — DULOXETINE HCL 60 MG PO CPEP: 60.0000 mg | ORAL_CAPSULE | Freq: Every morning | ORAL | 2 refills | Status: DC

## 2018-10-14 MED ORDER — TRAZODONE HCL 100 MG PO TABS: 100.0000 mg | ORAL_TABLET | Freq: Every day | ORAL | 2 refills | Status: DC

## 2018-10-14 NOTE — Progress Notes (Signed)
Patient ID: Breanna Zhang, female   DOB: [birth year], 78 y.o.   MRN: [id number]    Patient is a 78 year old female with history of metastatic breast cancer and mood disorder who was followed for medication management. She appeared tired and stated that she has been compliant with her medications. She was hard of hearing and her husband helped with her interview. She reported that she is following with the cancer center on a regular basis. Her husband reported that she is taking her medications and has an appointment towards the end of the month that the cancer center as her cancer is shrinking. Patient reported that she takes trazodone to help her sleep but she ran out of the medication. She currently denied having any suicidal or homicidal ideation or plans. Her husband remained supportive at this time.  Plan I will refill her Cymbalta and trazodone.  She will follow-up in two months or earlier depending on her symptoms.   I connected with patient via telemedicine application and verified that I am speaking with the correct person using two identifiers.  I discussed the limitations of evaluation and management by telemedicine and the availability of in person appointments. The patient expressed understanding and agreed to proceed.   I discussed the assessment and treatment plan with the patient. The patient was provided an opportunity to ask questions and all were answered. The patient agreed with the plan and demonstrated an understanding of the instructions.   The patient was advised to call back or seek an in-person evaluation if the symptoms worsen or if the condition fails to improve as anticipated.   I provided 15 minutes of non-face-to-face time during this encounter.

## 2018-10-17 DIAGNOSIS — H353221 Exudative age-related macular degeneration, left eye, with active choroidal neovascularization: Secondary | ICD-10-CM | POA: Diagnosis not present

## 2018-10-22 ENCOUNTER — Telehealth: Payer: Self-pay | Admitting: Pharmacist

## 2018-10-22 NOTE — Telephone Encounter (Signed)
Oral Chemotherapy Pharmacist Encounter   Spoke with patient today to follow up regarding patient's oral chemotherapy medication: Ibrance (palbociclib)  Original Start date of oral chemotherapy: 09/2017  Pt reports 0 tablets/doses of Ibrance missed in the last month.   Pt reports no side effects due to Ibrance.   Pertinent labs reviewed: CBC and CMET from 09/09/18.  Other Issues: She stated she has chronic issues with constipation, and most recently had to use magnesium citrate for relief. Patient reported she had not been using her Miralax frequently. Reinforced using Miralax or senna/docusate more frequently to prevent constipation.  Patient knows to call the office with questions or concerns.  Leron Croak, PharmD PGY2 Oncology/Hematology Pharmacy Resident 10/22/2018 10:11 AM Oral Oncology Clinic [phone]

## 2018-10-23 DIAGNOSIS — H353211 Exudative age-related macular degeneration, right eye, with active choroidal neovascularization: Secondary | ICD-10-CM | POA: Diagnosis not present

## 2018-10-31 MED FILL — IBRANCE 100 MG TABS: 100 | 28 days supply | Qty: 21 | Fill #1

## 2018-11-08 ENCOUNTER — Other Ambulatory Visit: Payer: Self-pay

## 2018-11-08 ENCOUNTER — Inpatient Hospital Stay (HOSPITAL_BASED_OUTPATIENT_CLINIC_OR_DEPARTMENT_OTHER): Payer: PPO | Admitting: Oncology

## 2018-11-08 ENCOUNTER — Inpatient Hospital Stay: Payer: PPO | Attending: Oncology

## 2018-11-08 ENCOUNTER — Encounter: Payer: Self-pay | Admitting: Oncology

## 2018-11-08 VITALS — BP 127/68 | HR 78 | Temp 98.0°F | Resp 18 | Wt 178.0 lb

## 2018-11-08 DIAGNOSIS — Z79899 Other long term (current) drug therapy: Secondary | ICD-10-CM | POA: Insufficient documentation

## 2018-11-08 DIAGNOSIS — Z170 Estrogen receptor positive status [ER+]: Secondary | ICD-10-CM | POA: Diagnosis not present

## 2018-11-08 DIAGNOSIS — D649 Anemia, unspecified: Secondary | ICD-10-CM | POA: Diagnosis not present

## 2018-11-08 DIAGNOSIS — C50919 Malignant neoplasm of unspecified site of unspecified female breast: Secondary | ICD-10-CM

## 2018-11-08 DIAGNOSIS — C782 Secondary malignant neoplasm of pleura: Secondary | ICD-10-CM | POA: Diagnosis not present

## 2018-11-08 DIAGNOSIS — C50911 Malignant neoplasm of unspecified site of right female breast: Secondary | ICD-10-CM | POA: Insufficient documentation

## 2018-11-08 DIAGNOSIS — C50912 Malignant neoplasm of unspecified site of left female breast: Secondary | ICD-10-CM

## 2018-11-08 DIAGNOSIS — Z79811 Long term (current) use of aromatase inhibitors: Secondary | ICD-10-CM | POA: Diagnosis not present

## 2018-11-08 LAB — CBC WITH DIFFERENTIAL/PLATELET
Abs Immature Granulocytes: 0.01 10*3/uL (ref 0.00–0.07)
Basophils Absolute: 0.1 10*3/uL (ref 0.0–0.1)
Basophils Relative: 1 %
Eosinophils Absolute: 0 10*3/uL (ref 0.0–0.5)
Eosinophils Relative: 1 %
HCT: 31.2 % — ABNORMAL LOW (ref 36.0–46.0)
Hemoglobin: 10.6 g/dL — ABNORMAL LOW (ref 12.0–15.0)
Immature Granulocytes: 0 %
Lymphocytes Relative: 34 %
Lymphs Abs: 1.5 10*3/uL (ref 0.7–4.0)
MCH: 33.9 pg (ref 26.0–34.0)
MCHC: 34 g/dL (ref 30.0–36.0)
MCV: 99.7 fL (ref 80.0–100.0)
Monocytes Absolute: 0.7 10*3/uL (ref 0.1–1.0)
Monocytes Relative: 15 %
Neutro Abs: 2.2 10*3/uL (ref 1.7–7.7)
Neutrophils Relative %: 49 %
Platelets: 161 10*3/uL (ref 150–400)
RBC: 3.13 MIL/uL — ABNORMAL LOW (ref 3.87–5.11)
RDW: 14.3 % (ref 11.5–15.5)
WBC: 4.5 10*3/uL (ref 4.0–10.5)
nRBC: 0 % (ref 0.0–0.2)

## 2018-11-08 LAB — COMPREHENSIVE METABOLIC PANEL
ALT: 12 U/L (ref 0–44)
AST: 21 U/L (ref 15–41)
Albumin: 3.8 g/dL (ref 3.5–5.0)
Alkaline Phosphatase: 57 U/L (ref 38–126)
Anion gap: 7 (ref 5–15)
BUN: 10 mg/dL (ref 8–23)
CO2: 24 mmol/L (ref 22–32)
Calcium: 8.5 mg/dL — ABNORMAL LOW (ref 8.9–10.3)
Chloride: 108 mmol/L (ref 98–111)
Creatinine, Ser: 0.82 mg/dL (ref 0.44–1.00)
GFR calc Af Amer: >60 mL/min (ref >60)
GFR calc non Af Amer: >60 mL/min (ref >60)
Glucose, Bld: 114 mg/dL — ABNORMAL HIGH (ref 70–99)
Potassium: 3.3 mmol/L — ABNORMAL LOW (ref 3.5–5.1)
Sodium: 139 mmol/L (ref 135–145)
Total Bilirubin: 0.6 mg/dL (ref 0.3–1.2)
Total Protein: 6.9 g/dL (ref 6.5–8.1)

## 2018-11-08 NOTE — Progress Notes (Signed)
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 She underwent mastectomy followed by adjuvant chemotherapy and 5 years of tamoxifen. Patient has been having some low back pain and underwent a CT lumbar spine without contrast on 08/10/2017 which was done by the pain clinic. CT mainly showed age-related degenerative disease but also showed incidental nodular thickening of the posterior left diaphragm concerning for tumor and a CT chest was recommended.  CT chest on 08/14/2017 showed scattered left pleural nodularity with tiny left pleural effusion and prominent left internal mammary and juxtadiaphragmatic lymph nodes which are nonspecific but metastatic disease could not be ruled out.  This was followed by a PET/CT scan on 08/22/2017 which showed numerous small left-sided pleural nodules which were mildly hypermetabolic along with hypermetabolic mediastinal lymph nodes concerning for metastatic disease. Small metastatic nodule in the left upper quadrant partly in the retrocrural space. No findings of pulmonary metastatic or osseous metastatic disease.   Patient underwent FNA of retrocrural LN which was positive for carcinoma but primary could not be ascertained. She then  underwent pleural biopsy which showed metastatic carcinoma consistent with breast primary. ER+HER-2. Patient started taking Ibrance in August 2019  RBC testing showedPIKCA and BRCA mutation  Scans have shown stable to improved disease so far  Interval history-she continues to have pain in her left ankle more than her left hip which makes it difficult for her to walk at times.  Her bowel movements have been irregular and sometimes she gets constipation very sometimes she has diarrhea.  ECOG PS- 1 Pain scale- 3 Opioid associated constipation- no  Review of systems- Review of Systems  Constitutional: Positive for malaise/fatigue. Negative for chills, fever and weight loss.  HENT: Negative for congestion, ear discharge and nosebleeds.   Eyes: Negative for blurred vision.  Respiratory: Negative for cough, hemoptysis, sputum production, shortness of breath and wheezing.   Cardiovascular: Negative for chest pain, palpitations, orthopnea and claudication.  Gastrointestinal: Positive for constipation. Negative for abdominal pain, blood in stool, diarrhea, heartburn, melena, nausea and vomiting.  Genitourinary: Negative for dysuria, flank pain, frequency, hematuria and urgency.  Musculoskeletal: Positive for joint pain. Negative for back pain and myalgias.  Skin: Negative for rash.  Neurological: Negative for dizziness, tingling, focal weakness, seizures, weakness and headaches.  Endo/Heme/Allergies: Does not bruise/bleed easily.  Psychiatric/Behavioral: Negative for depression and suicidal ideas. The patient does not have insomnia.       Allergies  Allergen Reactions  . Imipramine Tinitus  . Latex Rash  . Penicillins Rash  . Tape Rash     Past Medical History:  Diagnosis Date  . Anxiety   . Atrial fibrillation ([place])   . Breast cancer ([place]) 1999   RT MASTECTOMY and chemo tx's.   . DDD (degenerative disc disease), cervical    hands and back with arthritis  .  Depression   .  Diverticulosis   . Diverticulosis   . DVT of lower extremity (deep venous thrombosis) ([place])   . Dyspnea   . Family history of colon cancer   . Family history of lung cancer   . Family history of throat cancer   . GERD (gastroesophageal reflux disease)   . Goiter   . Headache   . History of chemotherapy 2000   BREAST CA  . Hypertension   . Incontinence   . Neuromuscular disorder ([place])    peipheral neuropathy  . OSA (obstructive sleep apnea)   . Osteoporosis   . Personal history of chemotherapy 1999   BREAST CA  . Status post chemotherapy    2000 right breast cancer  . Thyroid disease   . UTI (urinary tract infection)   . Vertigo      Past Surgical History:  Procedure Laterality Date  . ABDOMINAL HYSTERECTOMY    . BREAST BIOPSY Left 2006   negative  . BUNIONECTOMY Bilateral    Screws implanted.  . CHOLECYSTECTOMY    . COCHLEAR IMPLANT Right   . COLONOSCOPY WITH PROPOFOL N/A 07/24/2016   Procedure: COLONOSCOPY WITH PROPOFOL;  Surgeon: Manya Silvas, MD;  Location: [facility] ENDOSCOPY;  Service: Endoscopy;  Laterality: N/A;  . ESOPHAGOGASTRODUODENOSCOPY (EGD) WITH PROPOFOL N/A 04/12/2016   Procedure: ESOPHAGOGASTRODUODENOSCOPY (EGD) WITH PROPOFOL;  Surgeon: Manya Silvas, MD;  Location: [facility] ENDOSCOPY;  Service: Endoscopy;  Laterality: N/A;  . EYE SURGERY Bilateral    cataract  . FOOT SURGERY Bilateral   . HAND SURGERY Right   . MASTECTOMY Right 1999   BREAST CA  . MASTECTOMY Right 1999  . PARTIAL COLECTOMY    . THYROIDECTOMY    . TOTAL HIP ARTHROPLASTY Left 03/19/2018   Procedure: TOTAL HIP ARTHROPLASTY LEFT;  Surgeon: Hessie Knows, MD;  Location: [facility] ORS;  Service: Orthopedics;  Laterality: Left;  Marland Kitchen VIDEO ASSISTED THORACOSCOPY Left 09/10/2017   Procedure: VIDEO ASSISTED THORACOSCOPY;  Surgeon: [name], MD;  Location: [facility] ORS;  Service: Thoracic;  Laterality: Left;  with biopsies  . VIDEO BRONCHOSCOPY  09/10/2017   Procedure: VIDEO BRONCHOSCOPY;  Surgeon: [name], MD;  Location: [facility] ORS;  Service: Thoracic;;    Social History   Socioeconomic History  . Marital status: Married    Spouse name: evert  . Number of children: 4  . Years of education: Not on file  . Highest education level: 9th grade  Occupational History  . Not on file  Social Needs  . Financial resource strain: Somewhat hard  . Food insecurity    Worry: Often true    Inability: Often true  . Transportation needs    Medical: No    Non-medical: No  Tobacco Use  . Smoking status: Never Smoker  . Smokeless tobacco: Never Used  Substance and Sexual Activity  . Alcohol use: No    Alcohol/week: 0.0 standard drinks  . Drug use: No  . Sexual activity: Not Currently  Lifestyle  . Physical activity    Days per week: 0 days    Minutes per session: 0 min  . Stress: Not at all  Relationships  . Social Herbalist on phone: Not on file    Gets together: Not on file    Attends religious service: More than 4 times per year    Active member of club or organization: No    Attends meetings of clubs or organizations: Never    Relationship status: Married  .  Intimate partner violence    Fear of current or ex partner: No    Emotionally abused: No    Physically abused: No    Forced sexual activity: No  Other Topics Concern  . Not on file  Social History Narrative  . Not on file    Family History  Problem Relation Age of Onset  . Dementia Mother   . Dementia Sister   . Diabetes Sister   . Heart attack Brother 60  . COPD Brother   . Lung cancer Brother        hx smoking  . Colon cancer Brother   . Liver cancer Brother   . COPD Brother   . Lung cancer Brother        hx smoking  . Colon cancer Maternal Grandfather        dx >.50  . Breast cancer Neg Hx      Current Outpatient Medications:  .  acetaminophen (TYLENOL) 325 MG tablet, Take 650 mg by mouth every 4 (four) hours as needed. For pain / increased temp.  May be administered orally,  per G-Tube  if needed or rectally if unable to swallow (separate order).  Maximum dose for 24 hours is 3,000 mg from all sources of Acetaminophen / Tylenol, Disp: , Rfl:  .  atorvastatin (LIPITOR) 40 MG tablet, Take 1 tablet (40 mg total) by mouth at bedtime., Disp: 30 tablet, Rfl: 0 .  cholecalciferol (VITAMIN D) 1000 units tablet, Take 1,000 Units by mouth daily., Disp: , Rfl:  .  cyanocobalamin (V-R VITAMIN B-12) 500 MCG tablet, Take 500 mcg by mouth daily. , Disp: , Rfl:  .  DULoxetine (CYMBALTA) 60 MG capsule, Take 1 capsule (60 mg total) by mouth every morning., Disp: 30 capsule, Rfl: 2 .  gabapentin (NEURONTIN) 100 MG capsule, Take 1 capsule by mouth daily., Disp: , Rfl:  .  letrozole (FEMARA) 2.5 MG tablet, TAKE 1 TABLET BY MOUTH EVERY DAY, Disp: 90 tablet, Rfl: 1 .  levothyroxine (SYNTHROID) 100 MCG tablet, Take 1 tablet by mouth daily., Disp: , Rfl:  .  ondansetron (ZOFRAN) 4 MG tablet, TAKE 1 TABLET (4 MG TOTAL) BY MOUTH EVERY 6 (SIX) HOURS AS NEEDED FOR NAUSEA OR VOMITING., Disp: 21 tablet, Rfl: 1 .  palbociclib (IBRANCE) 100 MG tablet, Take 1 tablet (100 mg total) by mouth daily. Take for 21 days on, 7 days off, repeat every 28 days., Disp: 21 tablet, Rfl: 4 .  pantoprazole (PROTONIX) 40 MG tablet, Take 1 tablet (40 mg total) by mouth 2 (two) times daily., Disp: 60 tablet, Rfl: 0 .  polyethylene glycol (MIRALAX / GLYCOLAX) packet, Take 17 g by mouth at bedtime., Disp: , Rfl:  .  senna (SENOKOT) 8.6 MG TABS tablet, Take 2 tablets by mouth 2 (two) times daily., Disp: , Rfl:  .  traZODone (DESYREL) 100 MG tablet, Take 1 tablet (100 mg total) by mouth at bedtime., Disp: 30 tablet, Rfl: 2  Physical exam:  Vitals:   11/08/18 1024  BP: 127/68  Pulse: 78  Resp: 18  Temp: 98 F (36.7 C)  SpO2: 100%  Weight: 178 lb (80.7 kg)   Physical Exam Constitutional:      Comments: Elderly lady in no acute distress  HENT:     Head: Normocephalic and atraumatic.  Eyes:     Pupils: Pupils are equal, round,  and reactive to light.  Neck:     Musculoskeletal: Normal range of motion.  Cardiovascular:  Rate and Rhythm: Normal rate and regular rhythm.     Heart sounds: Normal heart sounds.  Pulmonary:     Effort: Pulmonary effort is normal.     Breath sounds: Normal breath sounds.  Abdominal:     General: Bowel sounds are normal.     Palpations: Abdomen is soft.  Skin:    General: Skin is warm and dry.  Neurological:     Mental Status: She is alert and oriented to person, place, and time.      CMP Latest Ref Rng & Units 11/08/2018  Glucose 70 - 99 mg/dL 114(H)  BUN 8 - 23 mg/dL 10  Creatinine 0.44 - 1.00 mg/dL 0.82  Sodium 135 - 145 mmol/L 139  Potassium 3.5 - 5.1 mmol/L 3.3(L)  Chloride 98 - 111 mmol/L 108  CO2 22 - 32 mmol/L 24  Calcium 8.9 - 10.3 mg/dL 8.5(L)  Total Protein 6.5 - 8.1 g/dL 6.9  Total Bilirubin 0.3 - 1.2 mg/dL 0.6  Alkaline Phos 38 - 126 U/L 57  AST 15 - 41 U/L 21  ALT 0 - 44 U/L 12   CBC Latest Ref Rng & Units 11/08/2018  WBC 4.0 - 10.5 K/uL 4.5  Hemoglobin 12.0 - 15.0 g/dL 10.6(L)  Hematocrit 36.0 - 46.0 % 31.2(L)  Platelets 150 - 400 K/uL 161     Assessment and plan- Patient is a 78 y.o. female with stage IV breast cancer ER positive.  Patient has low-volume disease with pleural nodules and currently on Ibrance and letrozole here for routine follow-up  Overall patient has low-volume disease mainly events few pleural nodules which have remained essentially stable.  I have asked her to continue her [facility] and letrozole at this time and she will be due for repeat scans in October 2020.  At that time if there is no evidence of overt disease progression I will consider giving her a break from [place] and only continuing letrozole and see if her side effects including fatigue joint pain and irregular bowel movements get better.  Ibrance usually causes diarrhea more than constipation so it is unclear if her side effects will improve after stopping Ibrance but given  her low volume disease it may be worth a try to give her a break for a few months while continuing letrozole.  Patient verbalized understanding  I will see the patient back in 2 months time with CBC with differential CMP CA-27-29 and CA-15-3.  Counts are okay to proceed with next cycle of Ibrance at this time.  She has mild normocytic anemia which is overall stable   Visit Diagnosis 1. Metastatic breast cancer ([place])   2. High risk medication use      Dr. Randa Evens, MD, MPH [facility] [phone] 11/08/2018 12:24 PM

## 2018-11-08 NOTE — Progress Notes (Signed)
Patient is here today for follow up, she is doing well no major complaints, mentions pain in left leg due to nerve damage

## 2018-11-09 LAB — CANCER ANTIGEN 15-3: CA 15-3: 29.9 U/mL — ABNORMAL HIGH (ref 0.0–25.0)

## 2018-11-13 DIAGNOSIS — G4733 Obstructive sleep apnea (adult) (pediatric): Secondary | ICD-10-CM | POA: Diagnosis not present

## 2018-11-14 DIAGNOSIS — G4733 Obstructive sleep apnea (adult) (pediatric): Secondary | ICD-10-CM | POA: Diagnosis not present

## 2018-11-20 ENCOUNTER — Other Ambulatory Visit (HOSPITAL_COMMUNITY): Payer: Self-pay | Admitting: Internal Medicine

## 2018-11-20 ENCOUNTER — Other Ambulatory Visit: Payer: Self-pay | Admitting: Internal Medicine

## 2018-11-20 DIAGNOSIS — M5431 Sciatica, right side: Secondary | ICD-10-CM

## 2018-11-20 DIAGNOSIS — E785 Hyperlipidemia, unspecified: Secondary | ICD-10-CM | POA: Diagnosis not present

## 2018-11-20 DIAGNOSIS — E034 Atrophy of thyroid (acquired): Secondary | ICD-10-CM | POA: Diagnosis not present

## 2018-11-20 DIAGNOSIS — F3342 Major depressive disorder, recurrent, in full remission: Secondary | ICD-10-CM | POA: Diagnosis not present

## 2018-11-20 DIAGNOSIS — T451X5A Adverse effect of antineoplastic and immunosuppressive drugs, initial encounter: Secondary | ICD-10-CM | POA: Diagnosis not present

## 2018-11-20 DIAGNOSIS — Z0000 Encounter for general adult medical examination without abnormal findings: Secondary | ICD-10-CM | POA: Diagnosis not present

## 2018-11-20 DIAGNOSIS — C50919 Malignant neoplasm of unspecified site of unspecified female breast: Secondary | ICD-10-CM

## 2018-11-20 DIAGNOSIS — D701 Agranulocytosis secondary to cancer chemotherapy: Secondary | ICD-10-CM | POA: Diagnosis not present

## 2018-11-20 DIAGNOSIS — M5136 Other intervertebral disc degeneration, lumbar region: Secondary | ICD-10-CM | POA: Diagnosis not present

## 2018-11-20 DIAGNOSIS — M5441 Lumbago with sciatica, right side: Secondary | ICD-10-CM

## 2018-11-20 DIAGNOSIS — K219 Gastro-esophageal reflux disease without esophagitis: Secondary | ICD-10-CM | POA: Diagnosis not present

## 2018-11-20 DIAGNOSIS — N3942 Incontinence without sensory awareness: Secondary | ICD-10-CM | POA: Diagnosis not present

## 2018-11-20 DIAGNOSIS — R739 Hyperglycemia, unspecified: Secondary | ICD-10-CM | POA: Diagnosis not present

## 2018-11-20 DIAGNOSIS — I10 Essential (primary) hypertension: Secondary | ICD-10-CM | POA: Diagnosis not present

## 2018-11-20 DIAGNOSIS — C7802 Secondary malignant neoplasm of left lung: Secondary | ICD-10-CM | POA: Diagnosis not present

## 2018-11-20 DIAGNOSIS — G4733 Obstructive sleep apnea (adult) (pediatric): Secondary | ICD-10-CM | POA: Diagnosis not present

## 2018-11-27 ENCOUNTER — Ambulatory Visit
Admission: RE | Admit: 2018-11-27 | Discharge: 2018-11-27 | Disposition: A | Discharge status: Home / Self Care | Payer: PPO | Source: Ambulatory Visit | Attending: Internal Medicine | Admitting: Internal Medicine

## 2018-11-27 ENCOUNTER — Other Ambulatory Visit: Payer: Self-pay

## 2018-11-27 DIAGNOSIS — M48061 Spinal stenosis, lumbar region without neurogenic claudication: Secondary | ICD-10-CM | POA: Diagnosis not present

## 2018-11-27 DIAGNOSIS — M5431 Sciatica, right side: Secondary | ICD-10-CM | POA: Diagnosis not present

## 2018-11-27 DIAGNOSIS — C50919 Malignant neoplasm of unspecified site of unspecified female breast: Secondary | ICD-10-CM | POA: Insufficient documentation

## 2018-11-27 DIAGNOSIS — M47816 Spondylosis without myelopathy or radiculopathy, lumbar region: Secondary | ICD-10-CM | POA: Diagnosis not present

## 2018-11-27 DIAGNOSIS — M5441 Lumbago with sciatica, right side: Secondary | ICD-10-CM | POA: Diagnosis not present

## 2018-11-27 DIAGNOSIS — N3942 Incontinence without sensory awareness: Secondary | ICD-10-CM | POA: Diagnosis not present

## 2018-11-27 MED ORDER — IOHEXOL 300 MG/ML  SOLN
100.0000 mL | Freq: Once | INTRAMUSCULAR | Status: AC | PRN
  Administered 2018-11-27: 10:00:00 100 mL via INTRAVENOUS

## 2018-11-28 MED FILL — IBRANCE 100 MG TABS: 100 | 28 days supply | Qty: 21 | Fill #2

## 2018-12-02 ENCOUNTER — Ambulatory Visit (INDEPENDENT_AMBULATORY_CARE_PROVIDER_SITE_OTHER): Payer: PPO | Admitting: Psychiatry

## 2018-12-02 ENCOUNTER — Encounter: Payer: Self-pay | Admitting: Psychiatry

## 2018-12-02 ENCOUNTER — Other Ambulatory Visit: Payer: Self-pay

## 2018-12-02 DIAGNOSIS — N3941 Urge incontinence: Secondary | ICD-10-CM | POA: Diagnosis not present

## 2018-12-02 DIAGNOSIS — F0630 Mood disorder due to known physiological condition, unspecified: Secondary | ICD-10-CM

## 2018-12-02 DIAGNOSIS — Z87440 Personal history of urinary (tract) infections: Secondary | ICD-10-CM | POA: Diagnosis not present

## 2018-12-02 MED ORDER — TRAZODONE HCL 100 MG PO TABS: 100.0000 mg | ORAL_TABLET | Freq: Every day | ORAL | 2 refills | Status: DC

## 2018-12-02 MED ORDER — DULOXETINE HCL 60 MG PO CPEP: 60.0000 mg | ORAL_CAPSULE | Freq: Every morning | ORAL | 2 refills | Status: DC

## 2018-12-02 NOTE — Progress Notes (Signed)
Patient ID: Breanna Zhang, female   DOB: [birth year], 78 y.o.   MRN: [id number]   Patient is a 78 year old female with history of mood disorder  Followed for medication management.Her daughter Breanna Zhang provided most of the information and reported that patient has been doing well on her medications. She reported that she is trying to stay safe from the Covid.She reported that patient has been complied with her medications. No other acute symptoms noted at this time. Daughter reported that patient has been stable on her current medications and does not want to change anything.  We discussed about medication compliance in detail and she agreed with the plan.  Plan Follow-up in three months earlier depending on her symptoms.   I connected with patient via telemedicine application and verified that I am speaking with the correct person using two identifiers.  I discussed the limitations of evaluation and management by telemedicine and the availability of in person appointments. The patient expressed understanding and agreed to proceed.   I discussed the assessment and treatment plan with the patient. The patient was provided an opportunity to ask questions and all were answered. The patient agreed with the plan and demonstrated an understanding of the instructions.   The patient was advised to call back or seek an in-person evaluation if the symptoms worsen or if the condition fails to improve as anticipated.   I provided 15 minutes of non-face-to-face time during this encounter.

## 2018-12-11 DIAGNOSIS — G4733 Obstructive sleep apnea (adult) (pediatric): Secondary | ICD-10-CM | POA: Diagnosis not present

## 2018-12-16 ENCOUNTER — Ambulatory Visit: Payer: PPO | Admitting: Psychiatry

## 2018-12-17 DIAGNOSIS — Z87440 Personal history of urinary (tract) infections: Secondary | ICD-10-CM | POA: Diagnosis not present

## 2018-12-17 DIAGNOSIS — N3281 Overactive bladder: Secondary | ICD-10-CM | POA: Diagnosis not present

## 2018-12-23 DIAGNOSIS — R739 Hyperglycemia, unspecified: Secondary | ICD-10-CM | POA: Diagnosis not present

## 2018-12-23 DIAGNOSIS — K219 Gastro-esophageal reflux disease without esophagitis: Secondary | ICD-10-CM | POA: Diagnosis not present

## 2018-12-23 DIAGNOSIS — Z780 Asymptomatic menopausal state: Secondary | ICD-10-CM | POA: Diagnosis not present

## 2018-12-23 DIAGNOSIS — M8949 Other hypertrophic osteoarthropathy, multiple sites: Secondary | ICD-10-CM | POA: Diagnosis not present

## 2018-12-23 DIAGNOSIS — C50919 Malignant neoplasm of unspecified site of unspecified female breast: Secondary | ICD-10-CM | POA: Diagnosis not present

## 2018-12-23 DIAGNOSIS — E034 Atrophy of thyroid (acquired): Secondary | ICD-10-CM | POA: Diagnosis not present

## 2018-12-23 DIAGNOSIS — E785 Hyperlipidemia, unspecified: Secondary | ICD-10-CM | POA: Diagnosis not present

## 2018-12-23 DIAGNOSIS — M5416 Radiculopathy, lumbar region: Secondary | ICD-10-CM | POA: Diagnosis not present

## 2018-12-23 DIAGNOSIS — F3342 Major depressive disorder, recurrent, in full remission: Secondary | ICD-10-CM | POA: Diagnosis not present

## 2018-12-23 DIAGNOSIS — I10 Essential (primary) hypertension: Secondary | ICD-10-CM | POA: Diagnosis not present

## 2018-12-23 DIAGNOSIS — C7802 Secondary malignant neoplasm of left lung: Secondary | ICD-10-CM | POA: Diagnosis not present

## 2018-12-25 ENCOUNTER — Ambulatory Visit: Payer: PPO

## 2018-12-26 MED FILL — IBRANCE 100 MG TABS: 100 | 28 days supply | Qty: 21 | Fill #3

## 2018-12-27 ENCOUNTER — Ambulatory Visit: Payer: PPO | Admitting: Oncology

## 2018-12-27 ENCOUNTER — Ambulatory Visit
Admission: RE | Admit: 2018-12-27 | Discharge: 2018-12-27 | Disposition: A | Discharge status: Home / Self Care | Payer: PPO | Source: Ambulatory Visit | Attending: Oncology | Admitting: Oncology

## 2018-12-27 ENCOUNTER — Other Ambulatory Visit: Payer: PPO

## 2018-12-27 ENCOUNTER — Other Ambulatory Visit: Payer: Self-pay

## 2018-12-27 DIAGNOSIS — K449 Diaphragmatic hernia without obstruction or gangrene: Secondary | ICD-10-CM | POA: Insufficient documentation

## 2018-12-27 DIAGNOSIS — C50919 Malignant neoplasm of unspecified site of unspecified female breast: Secondary | ICD-10-CM | POA: Diagnosis not present

## 2018-12-27 DIAGNOSIS — R918 Other nonspecific abnormal finding of lung field: Secondary | ICD-10-CM | POA: Insufficient documentation

## 2018-12-27 DIAGNOSIS — H353211 Exudative age-related macular degeneration, right eye, with active choroidal neovascularization: Secondary | ICD-10-CM | POA: Diagnosis not present

## 2018-12-27 LAB — POCT I-STAT CREATININE: Creatinine, Ser: 0.8 mg/dL (ref 0.44–1.00)

## 2018-12-27 MED ORDER — IOHEXOL 300 MG/ML  SOLN
100.0000 mL | Freq: Once | INTRAMUSCULAR | Status: AC | PRN
  Administered 2018-12-27: 100 mL via INTRAVENOUS

## 2018-12-30 ENCOUNTER — Other Ambulatory Visit: Payer: Self-pay

## 2018-12-30 ENCOUNTER — Encounter: Payer: Self-pay | Admitting: Oncology

## 2018-12-30 ENCOUNTER — Inpatient Hospital Stay (HOSPITAL_BASED_OUTPATIENT_CLINIC_OR_DEPARTMENT_OTHER): Payer: PPO | Admitting: Oncology

## 2018-12-30 ENCOUNTER — Inpatient Hospital Stay: Payer: PPO | Attending: Oncology

## 2018-12-30 ENCOUNTER — Ambulatory Visit: Payer: Self-pay | Admitting: Urology

## 2018-12-30 VITALS — BP 137/64 | HR 72 | Temp 96.5°F | Wt 174.0 lb

## 2018-12-30 DIAGNOSIS — Z170 Estrogen receptor positive status [ER+]: Secondary | ICD-10-CM | POA: Insufficient documentation

## 2018-12-30 DIAGNOSIS — C782 Secondary malignant neoplasm of pleura: Secondary | ICD-10-CM | POA: Insufficient documentation

## 2018-12-30 DIAGNOSIS — F3342 Major depressive disorder, recurrent, in full remission: Secondary | ICD-10-CM | POA: Insufficient documentation

## 2018-12-30 DIAGNOSIS — C50919 Malignant neoplasm of unspecified site of unspecified female breast: Secondary | ICD-10-CM | POA: Diagnosis not present

## 2018-12-30 DIAGNOSIS — C50911 Malignant neoplasm of unspecified site of right female breast: Secondary | ICD-10-CM | POA: Diagnosis not present

## 2018-12-30 DIAGNOSIS — Z79811 Long term (current) use of aromatase inhibitors: Secondary | ICD-10-CM | POA: Insufficient documentation

## 2018-12-30 DIAGNOSIS — Z79899 Other long term (current) drug therapy: Secondary | ICD-10-CM | POA: Diagnosis not present

## 2018-12-30 DIAGNOSIS — H903 Sensorineural hearing loss, bilateral: Secondary | ICD-10-CM | POA: Diagnosis not present

## 2018-12-30 LAB — COMPREHENSIVE METABOLIC PANEL
ALT: 12 U/L (ref 0–44)
AST: 20 U/L (ref 15–41)
Albumin: 3.8 g/dL (ref 3.5–5.0)
Alkaline Phosphatase: 52 U/L (ref 38–126)
Anion gap: 7 (ref 5–15)
BUN: 10 mg/dL (ref 8–23)
CO2: 25 mmol/L (ref 22–32)
Calcium: 8.3 mg/dL — ABNORMAL LOW (ref 8.9–10.3)
Chloride: 104 mmol/L (ref 98–111)
Creatinine, Ser: 0.71 mg/dL (ref 0.44–1.00)
GFR calc Af Amer: >60 mL/min (ref >60)
GFR calc non Af Amer: >60 mL/min (ref >60)
Glucose, Bld: 103 mg/dL — ABNORMAL HIGH (ref 70–99)
Potassium: 3.6 mmol/L (ref 3.5–5.1)
Sodium: 136 mmol/L (ref 135–145)
Total Bilirubin: 0.6 mg/dL (ref 0.3–1.2)
Total Protein: 6.9 g/dL (ref 6.5–8.1)

## 2018-12-30 LAB — CBC WITH DIFFERENTIAL/PLATELET
Abs Immature Granulocytes: 0.02 10*3/uL (ref 0.00–0.07)
Basophils Absolute: 0.1 10*3/uL (ref 0.0–0.1)
Basophils Relative: 1 %
Eosinophils Absolute: 0 10*3/uL (ref 0.0–0.5)
Eosinophils Relative: 1 %
HCT: 31.8 % — ABNORMAL LOW (ref 36.0–46.0)
Hemoglobin: 10.7 g/dL — ABNORMAL LOW (ref 12.0–15.0)
Immature Granulocytes: 1 %
Lymphocytes Relative: 39 %
Lymphs Abs: 1.6 10*3/uL (ref 0.7–4.0)
MCH: 34 pg (ref 26.0–34.0)
MCHC: 33.6 g/dL (ref 30.0–36.0)
MCV: 101 fL — ABNORMAL HIGH (ref 80.0–100.0)
Monocytes Absolute: 0.5 10*3/uL (ref 0.1–1.0)
Monocytes Relative: 13 %
Neutro Abs: 1.9 10*3/uL (ref 1.7–7.7)
Neutrophils Relative %: 45 %
Platelets: 120 10*3/uL — ABNORMAL LOW (ref 150–400)
RBC: 3.15 MIL/uL — ABNORMAL LOW (ref 3.87–5.11)
RDW: 13.8 % (ref 11.5–15.5)
WBC: 4 10*3/uL (ref 4.0–10.5)
nRBC: 0 % (ref 0.0–0.2)

## 2018-12-31 DIAGNOSIS — G4733 Obstructive sleep apnea (adult) (pediatric): Secondary | ICD-10-CM | POA: Diagnosis not present

## 2018-12-31 LAB — CANCER ANTIGEN 15-3: CA 15-3: 32.7 U/mL — ABNORMAL HIGH (ref 0.0–25.0)

## 2018-12-31 LAB — CANCER ANTIGEN 27.29: CA 27.29: 31.7 U/mL (ref 0.0–38.6)

## 2018-12-31 NOTE — Progress Notes (Signed)
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 She underwent mastectomy followed by adjuvant chemotherapy and 5 years of tamoxifen. Patient has been having some low back pain and underwent a CT lumbar spine without contrast on 08/10/2017 which was done by the pain clinic. CT mainly showed age-related degenerative disease but also showed incidental nodular thickening of the posterior left diaphragm concerning for tumor and a CT chest was recommended.  CT chest on 08/14/2017 showed scattered left pleural nodularity with tiny left pleural effusion and prominent left internal mammary and juxtadiaphragmatic lymph nodes which are nonspecific but metastatic disease could not be ruled out.  This was followed by a PET/CT scan on 08/22/2017 which showed numerous small left-sided pleural nodules which were mildly hypermetabolic along with hypermetabolic mediastinal lymph nodes concerning for metastatic disease. Small metastatic nodule in the left upper quadrant partly in the retrocrural space. No findings of pulmonary metastatic or osseous metastatic disease.   Patient underwent FNA of retrocrural LN which was positive for carcinoma but primary could not be ascertained. She then  underwent pleural biopsy which showed metastatic carcinoma consistent with breast primary. ER+HER-2. Patient started taking Ibrance in August 2019  RBC testing showedPIKCA and BRCA mutation  Scans have shown stable to improved disease so far  Interval history-patient reports chronic fatigue and pain in her left hip which is a longstanding issue.  Denies any new complaints at this time  ECOG PS- 1 Pain scale- 0  Review of systems- Review of Systems  Constitutional: Positive for malaise/fatigue. Negative for chills, fever and weight loss.  HENT: Negative for congestion, ear discharge and nosebleeds.   Eyes: Negative for blurred vision.  Respiratory: Negative for cough, hemoptysis, sputum production, shortness of breath and wheezing.   Cardiovascular: Negative for chest pain, palpitations, orthopnea and claudication.  Gastrointestinal: Negative for abdominal pain, blood in stool, constipation, diarrhea, heartburn, melena, nausea and vomiting.  Genitourinary: Negative for dysuria, flank pain, frequency, hematuria and urgency.  Musculoskeletal: Positive for joint pain. Negative for back pain and myalgias.  Skin: Negative for rash.  Neurological: Negative for dizziness, tingling, focal weakness, seizures, weakness and headaches.  Endo/Heme/Allergies: Does not bruise/bleed easily.  Psychiatric/Behavioral: Negative for depression and suicidal ideas. The patient does not have insomnia.       Allergies  Allergen Reactions   Imipramine Tinitus   Latex Rash   Penicillins Rash   Tape Rash     Past Medical History:  Diagnosis Date   Anxiety    Atrial fibrillation (HCC)    Breast cancer, right ([place]) 1999   RT MASTECTOMY and chemo tx's.    DDD (degenerative disc disease), cervical    hands and back with arthritis   Depression    Diverticulosis    Diverticulosis    DVT of lower extremity (deep venous thrombosis) ([place])  Dyspnea    Family history of colon cancer      Family history of lung cancer    Family history of throat cancer    GERD (gastroesophageal reflux disease)    Goiter    Headache    History of chemotherapy 2000   BREAST CA   Hypertension    Incontinence    Neuromuscular disorder ([place])    peipheral neuropathy   OSA (obstructive sleep apnea)    Osteoporosis    Personal history of chemotherapy 1999   BREAST CA   Status post chemotherapy    2000 right breast cancer   Thyroid disease    UTI (urinary tract infection)    Vertigo      Past Surgical History:  Procedure Laterality Date   ABDOMINAL HYSTERECTOMY     BREAST BIOPSY Left 2006   negative   BUNIONECTOMY Bilateral    Screws implanted.   CHOLECYSTECTOMY     COCHLEAR IMPLANT Right    COLONOSCOPY WITH PROPOFOL N/A 07/24/2016   Procedure: COLONOSCOPY WITH PROPOFOL;  Surgeon: Manya Silvas, MD;  Location: [facility] ENDOSCOPY;  Service: Endoscopy;  Laterality: N/A;   ESOPHAGOGASTRODUODENOSCOPY (EGD) WITH PROPOFOL N/A 04/12/2016   Procedure: ESOPHAGOGASTRODUODENOSCOPY (EGD) WITH PROPOFOL;  Surgeon: Manya Silvas, MD;  Location: [facility] ENDOSCOPY;  Service: Endoscopy;  Laterality: N/A;   EYE SURGERY Bilateral    cataract   FOOT SURGERY Bilateral    HAND SURGERY Right    MASTECTOMY Right 1999   BREAST CA   MASTECTOMY Right 1999   PARTIAL COLECTOMY     THYROIDECTOMY     TOTAL HIP ARTHROPLASTY Left 03/19/2018   Procedure: TOTAL HIP ARTHROPLASTY LEFT;  Surgeon: Hessie Knows, MD;  Location: [facility] ORS;  Service: Orthopedics;  Laterality: Left;   VIDEO ASSISTED THORACOSCOPY Left 09/10/2017   Procedure: VIDEO ASSISTED THORACOSCOPY;  Surgeon: [name], MD;  Location: [facility] ORS;  Service: Thoracic;  Laterality: Left;  with biopsies   VIDEO BRONCHOSCOPY  09/10/2017   Procedure: VIDEO BRONCHOSCOPY;  Surgeon: [name], MD;  Location: [facility] ORS;  Service: Thoracic;;    Social History   Socioeconomic History   Marital status: Married    Spouse  name: evert   Number of children: 4   Years of education: Not on file   Highest education level: 9th grade  Occupational History   Not on file  Social Needs   Financial resource strain: Somewhat hard   Food insecurity    Worry: Often true    Inability: Often true   Transportation needs    Medical: No    Non-medical: No  Tobacco Use   Smoking status: Never Smoker   Smokeless tobacco: Never Used  Substance and Sexual Activity   Alcohol use: No    Alcohol/week: 0.0 standard drinks   Drug use: No   Sexual activity: Not Currently  Lifestyle   Physical activity    Days per week: 0 days    Minutes per session: 0 min   Stress: Not at all  Relationships   Social connections    Talks on phone: Not on file    Gets together: Not on file    Attends religious service: More than 4 times per year    Active member of club or organization: No    Attends meetings of clubs or organizations: Never    Relationship status: Married   Intimate partner violence    Fear of current or ex partner: No    Emotionally abused: No  Physically abused: No    Forced sexual activity: No  Other Topics Concern   Not on file  Social History Narrative   Not on file    Family History  Problem Relation Age of Onset   Dementia Mother    Dementia Sister    Diabetes Sister    Heart attack Brother 39   COPD Brother    Lung cancer Brother        hx smoking   Colon cancer Brother    Liver cancer Brother    COPD Brother    Lung cancer Brother        hx smoking   Colon cancer Maternal Grandfather        dx >.50   Breast cancer Neg Hx      Current Outpatient Medications:    acetaminophen (TYLENOL) 325 MG tablet, Take 650 mg by mouth every 4 (four) hours as needed. For pain / increased temp.  May be administered orally,  per G-Tube if needed or rectally if unable to swallow (separate order).  Maximum dose for 24 hours is 3,000 mg from all sources of Acetaminophen /  Tylenol, Disp: , Rfl:    atorvastatin (LIPITOR) 40 MG tablet, Take 1 tablet (40 mg total) by mouth at bedtime., Disp: 30 tablet, Rfl: 0   cholecalciferol (VITAMIN D) 1000 units tablet, Take 1,000 Units by mouth daily., Disp: , Rfl:    cyanocobalamin (V-R VITAMIN B-12) 500 MCG tablet, Take 500 mcg by mouth daily. , Disp: , Rfl:    DULoxetine (CYMBALTA) 60 MG capsule, Take 1 capsule (60 mg total) by mouth every morning., Disp: 90 capsule, Rfl: 2   estradiol (ESTRACE) 0.1 MG/GM vaginal cream, Place 1 Applicatorful vaginally. Twice a week., Disp: , Rfl:    gabapentin (NEURONTIN) 100 MG capsule, Take 1 capsule by mouth daily., Disp: , Rfl:    letrozole (FEMARA) 2.5 MG tablet, TAKE 1 TABLET BY MOUTH EVERY DAY, Disp: 90 tablet, Rfl: 1   levothyroxine (SYNTHROID) 100 MCG tablet, Take 1 tablet by mouth daily., Disp: , Rfl:    mirabegron ER (MYRBETRIQ) 50 MG TB24 tablet, Take 1 tablet by mouth daily., Disp: , Rfl:    ondansetron (ZOFRAN) 4 MG tablet, TAKE 1 TABLET (4 MG TOTAL) BY MOUTH EVERY 6 (SIX) HOURS AS NEEDED FOR NAUSEA OR VOMITING., Disp: 21 tablet, Rfl: 1   palbociclib (IBRANCE) 100 MG tablet, Take 1 tablet (100 mg total) by mouth daily. Take for 21 days on, 7 days off, repeat every 28 days., Disp: 21 tablet, Rfl: 4   pantoprazole (PROTONIX) 40 MG tablet, Take 1 tablet (40 mg total) by mouth 2 (two) times daily., Disp: 60 tablet, Rfl: 0   polyethylene glycol (MIRALAX / GLYCOLAX) packet, Take 17 g by mouth at bedtime., Disp: , Rfl:    senna (SENOKOT) 8.6 MG TABS tablet, Take 2 tablets by mouth 2 (two) times daily., Disp: , Rfl:    traZODone (DESYREL) 100 MG tablet, Take 1 tablet (100 mg total) by mouth at bedtime., Disp: 90 tablet, Rfl: 2  Physical exam:  Vitals:   12/30/18 1116  BP: 137/64  Pulse: 72  Temp: (!) 96.5 F (35.8 C)  TempSrc: Tympanic  Weight: 174 lb (78.9 kg)   Physical Exam Constitutional:      General: She is not in acute distress. HENT:     Head:  Normocephalic and atraumatic.  Eyes:     Pupils: Pupils are equal, round, and reactive to light.  Neck:  Musculoskeletal: Normal range of motion.  Cardiovascular:     Rate and Rhythm: Normal rate and regular rhythm.     Heart sounds: Normal heart sounds.  Pulmonary:     Effort: Pulmonary effort is normal.     Breath sounds: Normal breath sounds.  Abdominal:     General: Bowel sounds are normal.     Palpations: Abdomen is soft.  Skin:    General: Skin is warm and dry.  Neurological:     Mental Status: She is alert and oriented to person, place, and time.      CMP Latest Ref Rng & Units 12/30/2018  Glucose 70 - 99 mg/dL 103(H)  BUN 8 - 23 mg/dL 10  Creatinine 0.44 - 1.00 mg/dL 0.71  Sodium 135 - 145 mmol/L 136  Potassium 3.5 - 5.1 mmol/L 3.6  Chloride 98 - 111 mmol/L 104  CO2 22 - 32 mmol/L 25  Calcium 8.9 - 10.3 mg/dL 8.3(L)  Total Protein 6.5 - 8.1 g/dL 6.9  Total Bilirubin 0.3 - 1.2 mg/dL 0.6  Alkaline Phos 38 - 126 U/L 52  AST 15 - 41 U/L 20  ALT 0 - 44 U/L 12   CBC Latest Ref Rng & Units 12/30/2018  WBC 4.0 - 10.5 K/uL 4.0  Hemoglobin 12.0 - 15.0 g/dL 10.7(L)  Hematocrit 36.0 - 46.0 % 31.8(L)  Platelets 150 - 400 K/uL 120(L)    No images are attached to the encounter.  Ct Chest W Contrast  Result Date: 12/27/2018 CLINICAL DATA:  Breast cancer.  Restaging. EXAM: CT CHEST, ABDOMEN, AND PELVIS WITH CONTRAST TECHNIQUE: Multidetector CT imaging of the chest, abdomen and pelvis was performed following the standard protocol during bolus administration of intravenous contrast. CONTRAST:  14m OMNIPAQUE IOHEXOL 300 MG/ML  SOLN COMPARISON:  09/04/2018 FINDINGS: CT CHEST FINDINGS Cardiovascular: The heart size is normal. No substantial pericardial effusion. Atherosclerotic calcification is noted in the wall of the thoracic aorta. Mediastinum/Nodes: No mediastinal lymphadenopathy. There is no hilar lymphadenopathy. The esophagus has normal imaging features. There is no  axillary lymphadenopathy. Surgical clips are noted in the right axilla Lungs/Pleura: 4 mm left lower lobe perifissural nodule on 76/3 is unchanged. No new suspicious nodule or mass. No pleural effusion. The tiny areas of nodularity seen posteromedially in the right lower lobe pleura are stable. Nodularity along the left major fissure (37/3) is unchanged. Musculoskeletal: No worrisome lytic or sclerotic osseous abnormality. Right mastectomy. CT ABDOMEN PELVIS FINDINGS Hepatobiliary: No suspicious focal abnormality within the liver parenchyma. Gallbladder surgically absent. No intrahepatic or extrahepatic biliary dilation. Pancreas: No focal mass lesion. No dilatation of the main duct. No intraparenchymal cyst. No peripancreatic edema. Spleen: No splenomegaly. No focal mass lesion. Adrenals/Urinary Tract: No adrenal nodule or mass. Kidneys unremarkable. No evidence for hydroureter. Bladder unremarkable. Crescent shaped calcifications in the region of the proximal ureter are likely stones within a urethral diverticulum. Stomach/Bowel: Moderate hiatal hernia. Duodenum is normally positioned as is the ligament of Treitz. No small bowel wall thickening. No small bowel dilatation. Status post right hemicolectomy. Diverticular changes are noted in the left colon without evidence of diverticulitis. Vascular/Lymphatic: There is abdominal aortic atherosclerosis without aneurysm. There is no gastrohepatic or hepatoduodenal ligament lymphadenopathy. No intraperitoneal or retroperitoneal lymphadenopathy. No pelvic sidewall lymphadenopathy. Reproductive: The uterus is surgically absent. There is no adnexal mass. Other: No intraperitoneal free fluid. Musculoskeletal: Status post left hip replacement. No worrisome lytic or sclerotic osseous abnormality. IMPRESSION: 1. Stable exam. Minimal nodularity described previously in the left pleural space  is similar. No new or progressive interval findings. No features to suggest definite  metastatic disease in the chest, abdomen, or pelvis. 2. Moderate hiatal hernia. 3.  Aortic Atherosclerois (ICD10-170.0) Electronically Signed   By: Misty Stanley M.D.   On: 12/27/2018 10:44   Ct Abdomen Pelvis W Contrast  Result Date: 12/27/2018 CLINICAL DATA:  Breast cancer.  Restaging. EXAM: CT CHEST, ABDOMEN, AND PELVIS WITH CONTRAST TECHNIQUE: Multidetector CT imaging of the chest, abdomen and pelvis was performed following the standard protocol during bolus administration of intravenous contrast. CONTRAST:  140m OMNIPAQUE IOHEXOL 300 MG/ML  SOLN COMPARISON:  09/04/2018 FINDINGS: CT CHEST FINDINGS Cardiovascular: The heart size is normal. No substantial pericardial effusion. Atherosclerotic calcification is noted in the wall of the thoracic aorta. Mediastinum/Nodes: No mediastinal lymphadenopathy. There is no hilar lymphadenopathy. The esophagus has normal imaging features. There is no axillary lymphadenopathy. Surgical clips are noted in the right axilla Lungs/Pleura: 4 mm left lower lobe perifissural nodule on 76/3 is unchanged. No new suspicious nodule or mass. No pleural effusion. The tiny areas of nodularity seen posteromedially in the right lower lobe pleura are stable. Nodularity along the left major fissure (37/3) is unchanged. Musculoskeletal: No worrisome lytic or sclerotic osseous abnormality. Right mastectomy. CT ABDOMEN PELVIS FINDINGS Hepatobiliary: No suspicious focal abnormality within the liver parenchyma. Gallbladder surgically absent. No intrahepatic or extrahepatic biliary dilation. Pancreas: No focal mass lesion. No dilatation of the main duct. No intraparenchymal cyst. No peripancreatic edema. Spleen: No splenomegaly. No focal mass lesion. Adrenals/Urinary Tract: No adrenal nodule or mass. Kidneys unremarkable. No evidence for hydroureter. Bladder unremarkable. Crescent shaped calcifications in the region of the proximal ureter are likely stones within a urethral diverticulum.  Stomach/Bowel: Moderate hiatal hernia. Duodenum is normally positioned as is the ligament of Treitz. No small bowel wall thickening. No small bowel dilatation. Status post right hemicolectomy. Diverticular changes are noted in the left colon without evidence of diverticulitis. Vascular/Lymphatic: There is abdominal aortic atherosclerosis without aneurysm. There is no gastrohepatic or hepatoduodenal ligament lymphadenopathy. No intraperitoneal or retroperitoneal lymphadenopathy. No pelvic sidewall lymphadenopathy. Reproductive: The uterus is surgically absent. There is no adnexal mass. Other: No intraperitoneal free fluid. Musculoskeletal: Status post left hip replacement. No worrisome lytic or sclerotic osseous abnormality. IMPRESSION: 1. Stable exam. Minimal nodularity described previously in the left pleural space is similar. No new or progressive interval findings. No features to suggest definite metastatic disease in the chest, abdomen, or pelvis. 2. Moderate hiatal hernia. 3.  Aortic Atherosclerois (ICD10-170.0) Electronically Signed   By: EMisty StanleyM.D.   On: 12/27/2018 10:44     Assessment and plan- Patient is a 78y.o. female with stage IV breast cancer ER positive.  Patient has low-volume disease with pleural nodules.  She is here for routine follow-up of breast cancer on Ibrance and letrozole  Patient has been on Ibrance and letrozole for over a year now.  I have reviewed recent CT chest abdomen and pelvis images and dependently and discussed findings with the patient. Patient continues to have mild pleural nodularity around the right lower lobe pleura which has essentially remained stable.  Other than that she does not have any other evidence of distant metastatic breast cancer.  Given the small volume disease I am inclined to stop the Ibrance at this time and give her a temporary break.  She can continue letrozole.  I will continue to monitor her tumor markers and get scans every 3 to 4 months.   If there is any  concern for progression in the future I will add her Ibrance back.  Leslee Home can cause cytopenias as well as partly contributed to her joint pain and fatigue.  It would be worthwhile to see if patient has a better quality of life of Ibrance.  Patient verbalized understanding  I will see her back in 3 months time with labs CBC with differential, CMP and CA 27-29 and CA 15-3   Visit Diagnosis 1. Metastatic breast cancer ([place])   2. High risk medication use   3. Use of letrozole ([place])      Dr. Randa Evens, MD, MPH [facility] [phone] 12/31/2018 12:17 PM

## 2019-01-07 DIAGNOSIS — Z9621 Cochlear implant status: Secondary | ICD-10-CM | POA: Diagnosis not present

## 2019-01-07 DIAGNOSIS — H903 Sensorineural hearing loss, bilateral: Secondary | ICD-10-CM | POA: Diagnosis not present

## 2019-01-08 DIAGNOSIS — H353221 Exudative age-related macular degeneration, left eye, with active choroidal neovascularization: Secondary | ICD-10-CM | POA: Diagnosis not present

## 2019-01-15 DIAGNOSIS — M8588 Other specified disorders of bone density and structure, other site: Secondary | ICD-10-CM | POA: Diagnosis not present

## 2019-01-16 DIAGNOSIS — I10 Essential (primary) hypertension: Secondary | ICD-10-CM | POA: Diagnosis not present

## 2019-01-16 DIAGNOSIS — C50919 Malignant neoplasm of unspecified site of unspecified female breast: Secondary | ICD-10-CM | POA: Diagnosis not present

## 2019-01-16 DIAGNOSIS — R300 Dysuria: Secondary | ICD-10-CM | POA: Diagnosis not present

## 2019-01-16 DIAGNOSIS — F3342 Major depressive disorder, recurrent, in full remission: Secondary | ICD-10-CM | POA: Diagnosis not present

## 2019-01-16 DIAGNOSIS — C7802 Secondary malignant neoplasm of left lung: Secondary | ICD-10-CM | POA: Diagnosis not present

## 2019-01-29 ENCOUNTER — Encounter: Payer: Self-pay | Admitting: Oncology

## 2019-01-31 DIAGNOSIS — G4733 Obstructive sleep apnea (adult) (pediatric): Secondary | ICD-10-CM | POA: Diagnosis not present

## 2019-02-10 DIAGNOSIS — M25562 Pain in left knee: Secondary | ICD-10-CM | POA: Diagnosis not present

## 2019-02-10 DIAGNOSIS — M1712 Unilateral primary osteoarthritis, left knee: Secondary | ICD-10-CM | POA: Diagnosis not present

## 2019-02-10 DIAGNOSIS — M5416 Radiculopathy, lumbar region: Secondary | ICD-10-CM | POA: Diagnosis not present

## 2019-02-17 ENCOUNTER — Other Ambulatory Visit: Payer: Self-pay

## 2019-02-17 ENCOUNTER — Ambulatory Visit: Payer: PPO | Admitting: Psychiatry

## 2019-02-26 ENCOUNTER — Encounter: Payer: Self-pay | Admitting: Urology

## 2019-02-26 ENCOUNTER — Other Ambulatory Visit: Payer: Self-pay

## 2019-02-26 ENCOUNTER — Ambulatory Visit: Payer: PPO | Admitting: Urology

## 2019-02-26 VITALS — BP 138/77 | HR 74 | Ht 66.0 in | Wt 166.0 lb

## 2019-02-26 DIAGNOSIS — N390 Urinary tract infection, site not specified: Secondary | ICD-10-CM | POA: Diagnosis not present

## 2019-02-26 DIAGNOSIS — N3281 Overactive bladder: Secondary | ICD-10-CM | POA: Diagnosis not present

## 2019-02-26 DIAGNOSIS — N952 Postmenopausal atrophic vaginitis: Secondary | ICD-10-CM | POA: Diagnosis not present

## 2019-02-26 DIAGNOSIS — N3941 Urge incontinence: Secondary | ICD-10-CM

## 2019-02-26 DIAGNOSIS — R300 Dysuria: Secondary | ICD-10-CM | POA: Diagnosis not present

## 2019-02-26 LAB — MICROSCOPIC EXAMINATION: Bacteria, UA: NONE SEEN

## 2019-02-26 LAB — URINALYSIS, COMPLETE
Bilirubin, UA: NEGATIVE
Glucose, UA: NEGATIVE
Ketones, UA: NEGATIVE
Leukocytes,UA: NEGATIVE
Nitrite, UA: NEGATIVE
Protein,UA: NEGATIVE
Specific Gravity, UA: 1.01 (ref 1.005–1.030)
Urobilinogen, Ur: 1 mg/dL (ref 0.2–1.0)
pH, UA: 5.5 (ref 5.0–7.5)

## 2019-02-26 MED ORDER — MIRABEGRON ER 50 MG PO TB24: 50.0000 mg | ORAL_TABLET | Freq: Every day | ORAL | 3 refills | Status: DC

## 2019-02-26 MED ORDER — ESTRADIOL 0.1 MG/GM VA CREA: 1.0000 | TOPICAL_CREAM | VAGINAL | 2 refills | Status: DC

## 2019-02-26 NOTE — Progress Notes (Signed)
02/26/2019 4:18 PM   Breanna Zhang [birth year] [id number]  Referring provider: Adin Hector, MD [place] [facility] [place],  [place] [address]  Chief Complaint  Patient presents with  . Dysuria    HPI: 78 year old female with a personal history of recurrent urinary tract infections, atrophic vaginitis and severe urgency/OAB and urge incontinence who seeks to establish care with a urologist more locally.  She was recently seen and evaluated at [facility] urogynecology.  She reports that she had a pelvic exam which showed evidence of good vaginal vault support without cystocele with atrophy.  This is supported by documentation from this visit.  She was started on topical estrogen cream.  She reports that she was not able to tolerate this because of the burning and irritation.  She was symptomatic at the time notably.  She has not tried it since she is been asymptomatic.   She does have a personal history of breast cancer.  She reports that she was complaining of vaginal burning/irritation and told that she did not have a urinary tract infection at the time with a negative culture.  Her burning persisted and she ultimately ended up being diagnosed with a urinary tract infection by her primary care physician.  Urine culture from 01/16/2019 grew pansensitive E. coli.  She reports that after being treated with antibiotics for this, her burning and irritation resolved.  She also notes that she was having lateralized right lower quadrant pain associated with this which is also improved.  The above is the only document infection this year.  In addition to the above, she has severe urinary urgency and urge incontinence.  Since being started on Myrbetriq 50 mg by urogynecology, her symptoms have improved somewhat.  She still has episodes of incontinence and urinary frequency but these are better.  She is still very bothered by this.  Her history of urinary incontinence is a very  longstanding.  She is previously followed by Dr. Jacqlyn Larsen in the remote past.  She has tried numerous anticholinergics without success.  She is also failed Botox in the past as well.  She reports that she was told that she failed this because she went into retention after the procedure and had have a catheter which negated the response of the medication supposedly.    PMH: Past Medical History:  Diagnosis Date  . Anxiety   . Atrial fibrillation ([place])   . Breast cancer, right ([place]) 1999   RT MASTECTOMY and chemo tx's.   . DDD (degenerative disc disease), cervical    hands and back with arthritis  . Depression   . Diverticulosis   . Diverticulosis   . DVT of lower extremity (deep venous thrombosis) ([employer])   . Dyspnea   . Family history of colon cancer   . Family history of lung cancer   . Family history of throat cancer   . GERD (gastroesophageal reflux disease)   . Goiter   . Headache   . History of chemotherapy 2000   BREAST CA  . Hypertension   . Incontinence   . Neuromuscular disorder ([place])    peipheral neuropathy  . OSA (obstructive sleep apnea)   . Osteoporosis   . Personal history of chemotherapy 1999   BREAST CA  . Status post chemotherapy    2000 right breast cancer  . Thyroid disease   . UTI (urinary tract infection)   . Vertigo     Surgical History: Past Surgical History:  Procedure Laterality Date  . ABDOMINAL HYSTERECTOMY    . BREAST BIOPSY Left 2006   negative  . BUNIONECTOMY Bilateral    Screws implanted.  . CHOLECYSTECTOMY    . COCHLEAR IMPLANT Right   . COLONOSCOPY WITH PROPOFOL N/A 07/24/2016   Procedure: COLONOSCOPY WITH PROPOFOL;  Surgeon: Manya Silvas, MD;  Location: [facility] ENDOSCOPY;  Service: Endoscopy;  Laterality: N/A;  . ESOPHAGOGASTRODUODENOSCOPY (EGD) WITH PROPOFOL N/A 04/12/2016   Procedure: ESOPHAGOGASTRODUODENOSCOPY (EGD) WITH PROPOFOL;  Surgeon: Manya Silvas, MD;  Location: [facility] ENDOSCOPY;  Service: Endoscopy;  Laterality: N/A;  .  EYE SURGERY Bilateral    cataract  . FOOT SURGERY Bilateral   . HAND SURGERY Right   . MASTECTOMY Right 1999   BREAST CA  . MASTECTOMY Right 1999  . PARTIAL COLECTOMY    . THYROIDECTOMY    . TOTAL HIP ARTHROPLASTY Left 03/19/2018   Procedure: TOTAL HIP ARTHROPLASTY LEFT;  Surgeon: Hessie Knows, MD;  Location: [facility] ORS;  Service: Orthopedics;  Laterality: Left;  Marland Kitchen VIDEO ASSISTED THORACOSCOPY Left 09/10/2017   Procedure: VIDEO ASSISTED THORACOSCOPY;  Surgeon: [name], MD;  Location: [facility] ORS;  Service: Thoracic;  Laterality: Left;  with biopsies  . VIDEO BRONCHOSCOPY  09/10/2017   Procedure: VIDEO BRONCHOSCOPY;  Surgeon: [name], MD;  Location: [facility] ORS;  Service: Thoracic;;    Home Medications:  Allergies as of 02/26/2019      Reactions   Imipramine Tinitus   Latex Rash   Penicillins Rash   Tape Rash      Medication List       Accurate as of February 26, 2019 11:59 PM. If you have any questions, ask your nurse or doctor.        acetaminophen 325 MG tablet Commonly known as: TYLENOL Take 650 mg by mouth every 4 (four) hours as needed. For pain / increased temp.  May be administered orally,  per G-Tube if needed or rectally if unable to swallow (separate order).  Maximum dose for 24 hours is 3,000 mg from all sources of Acetaminophen / Tylenol   atorvastatin 40 MG tablet Commonly known as: LIPITOR Take 1 tablet (40 mg total) by mouth at bedtime.   cholecalciferol 1000 units tablet Commonly known as: VITAMIN D Take 1,000 Units by mouth daily.   DULoxetine 60 MG capsule Commonly known as: CYMBALTA Take 1 capsule (60 mg total) by mouth every morning.   estradiol 0.1 MG/GM vaginal cream Commonly known as: ESTRACE Place 1 Applicatorful vaginally 3 (three) times a week. Twice a week. What changed: when to take this Changed by: Hollice Espy, MD   gabapentin 100 MG capsule Commonly known as: NEURONTIN Take 1 capsule by mouth daily.   letrozole 2.5 MG  tablet Commonly known as: FEMARA TAKE 1 TABLET BY MOUTH EVERY DAY   levothyroxine 100 MCG tablet Commonly known as: SYNTHROID Take 1 tablet by mouth daily.   mirabegron ER 50 MG Tb24 tablet Commonly known as: MYRBETRIQ Take 1 tablet (50 mg total) by mouth daily.   ondansetron 4 MG tablet Commonly known as: ZOFRAN TAKE 1 TABLET (4 MG TOTAL) BY MOUTH EVERY 6 (SIX) HOURS AS NEEDED FOR NAUSEA OR VOMITING.   palbociclib 100 MG tablet Commonly known as: Ibrance Take 1 tablet (100 mg total) by mouth daily. Take for 21 days on, 7 days off, repeat every 28 days.   pantoprazole 40 MG tablet Commonly known as: PROTONIX Take 1 tablet (40 mg total) by mouth 2 (two) times daily.  polyethylene glycol 17 g packet Commonly known as: MIRALAX / GLYCOLAX Take 17 g by mouth at bedtime.   senna 8.6 MG Tabs tablet Commonly known as: SENOKOT Take 2 tablets by mouth 2 (two) times daily.   traZODone 100 MG tablet Commonly known as: DESYREL Take 1 tablet (100 mg total) by mouth at bedtime.   V-R VITAMIN B-12 500 MCG tablet Generic drug: vitamin B-12 Take 500 mcg by mouth daily.       Allergies:  Allergies  Allergen Reactions  . Imipramine Tinitus  . Latex Rash  . Penicillins Rash  . Tape Rash    Family History: Family History  Problem Relation Age of Onset  . Dementia Mother   . Dementia Sister   . Diabetes Sister   . Heart attack Brother 69  . COPD Brother   . Lung cancer Brother        hx smoking  . Colon cancer Brother   . Liver cancer Brother   . COPD Brother   . Lung cancer Brother        hx smoking  . Colon cancer Maternal Grandfather        dx >.50  . Breast cancer Neg Hx     Social History:  reports that she has never smoked. She has never used smokeless tobacco. She reports that she does not drink alcohol or use drugs.  ROS: UROLOGY Frequent Urination?: No Hard to postpone urination?: No Burning/pain with urination?: No Get up at night to urinate?:  No Leakage of urine?: No Urine stream starts and stops?: Yes Trouble starting stream?: Yes Do you have to strain to urinate?: Yes Blood in urine?: No Urinary tract infection?: No Sexually transmitted disease?: No Injury to kidneys or bladder?: No Painful intercourse?: No Weak stream?: No Currently pregnant?: No Vaginal bleeding?: No Last menstrual period?: n  Gastrointestinal Nausea?: No Vomiting?: No Indigestion/heartburn?: No Diarrhea?: No Constipation?: Yes  Constitutional Fever: No Night sweats?: Yes Weight loss?: No Fatigue?: Yes  Skin Skin rash/lesions?: No Itching?: No  Eyes Blurred vision?: No Double vision?: No  Ears/Nose/Throat Sore throat?: No Sinus problems?: No  Hematologic/Lymphatic Swollen glands?: No Easy bruising?: No  Cardiovascular Leg swelling?: No Chest pain?: No  Respiratory Cough?: No Shortness of breath?: No  Endocrine Excessive thirst?: No  Musculoskeletal Back pain?: Yes Joint pain?: No  Neurological Headaches?: No Dizziness?: No  Psychologic Depression?: No Anxiety?: No  Physical Exam: BP 138/77   Pulse 74   Ht 5\' 6"  (1.676 m)   Wt 166 lb (75.3 kg)   BMI 26.79 kg/m   Constitutional:  Alert and oriented, No acute distress. HEENT: [place] AT, moist mucus membranes.  Trachea midline, no masses. Cardiovascular: No clubbing, cyanosis, or edema. Respiratory: Normal respiratory effort, no increased work of breathing. Skin: No rashes, bruises or suspicious lesions. Neurologic: Grossly intact, no focal deficits, moving all 4 extremities. Psychiatric: Normal mood and affect.  Laboratory Data: Lab Results  Component Value Date   WBC 4.0 12/30/2018   HGB 10.7 (L) 12/30/2018   HCT 31.8 (L) 12/30/2018   MCV 101.0 (H) 12/30/2018   PLT 120 (L) 12/30/2018    Lab Results  Component Value Date   CREATININE 0.71 12/30/2018    Urinalysis UA negative  Pertinent Imaging: PVR 106   Assessment & Plan:    1.  Recurrent UTI Only isolated single infection over the past 12 months  Documented history of vaginal atrophy as below  Adequate emptying without evidence of a cystocele  I have urged her if she believes that she has a urinary tract infection, arrange for same-day visit in our office for UA urine culture and evaluation - Urinalysis, Complete  2. OAB (overactive bladder) Severe refractory OAB symptoms improved on Myrbetriq 50 mg  Based on her history above, I believe that one consideration could be posterior nerve stimulation to augment Myrbetriq 50 mg.  Options beyond this are somewhat limited.  We discussed treatment goals as well today.  She was given information about posterior nerve stimulation let us know if she would like to try this.  Myrbetriq 50 mg refilled at patient's request today.  3. Urge incontinence As above  4. Vaginal atrophy Abandoned topical estrogen cream which may benefit both her urinary symptoms as well as UTIs and burning  It is unclear whether or not she received clearance from her oncologist to take this medication in light of history of breast cancer.  I will message Dr. Janese Banks to ensure that she is okay with topical local estrogen cream for this patient.  Samples given today and prescription refilled.   Return in about 6 months (around 08/27/2019).  Hollice Espy, MD  [facility] [address], [place], [place] [address] [phone]

## 2019-02-27 ENCOUNTER — Encounter: Payer: Self-pay | Admitting: Urology

## 2019-03-02 DIAGNOSIS — G4733 Obstructive sleep apnea (adult) (pediatric): Secondary | ICD-10-CM | POA: Diagnosis not present

## 2019-03-12 DIAGNOSIS — H353221 Exudative age-related macular degeneration, left eye, with active choroidal neovascularization: Secondary | ICD-10-CM | POA: Diagnosis not present

## 2019-03-14 ENCOUNTER — Other Ambulatory Visit: Payer: Self-pay | Admitting: Oncology

## 2019-03-25 ENCOUNTER — Ambulatory Visit: Payer: PPO | Admitting: Psychiatry

## 2019-04-02 ENCOUNTER — Encounter: Payer: Self-pay | Admitting: Oncology

## 2019-04-02 ENCOUNTER — Other Ambulatory Visit: Payer: Self-pay

## 2019-04-02 ENCOUNTER — Inpatient Hospital Stay: Payer: PPO | Attending: Oncology

## 2019-04-02 DIAGNOSIS — H353221 Exudative age-related macular degeneration, left eye, with active choroidal neovascularization: Secondary | ICD-10-CM | POA: Diagnosis not present

## 2019-04-02 DIAGNOSIS — G4733 Obstructive sleep apnea (adult) (pediatric): Secondary | ICD-10-CM | POA: Diagnosis not present

## 2019-04-02 DIAGNOSIS — C50911 Malignant neoplasm of unspecified site of right female breast: Secondary | ICD-10-CM | POA: Diagnosis not present

## 2019-04-02 DIAGNOSIS — C50919 Malignant neoplasm of unspecified site of unspecified female breast: Secondary | ICD-10-CM

## 2019-04-02 DIAGNOSIS — R918 Other nonspecific abnormal finding of lung field: Secondary | ICD-10-CM | POA: Insufficient documentation

## 2019-04-02 LAB — CBC WITH DIFFERENTIAL/PLATELET
Abs Immature Granulocytes: 0.02 10*3/uL (ref 0.00–0.07)
Basophils Absolute: 0.1 10*3/uL (ref 0.0–0.1)
Basophils Relative: 1 %
Eosinophils Absolute: 0.1 10*3/uL (ref 0.0–0.5)
Eosinophils Relative: 1 %
HCT: 39.1 % (ref 36.0–46.0)
Hemoglobin: 12.1 g/dL (ref 12.0–15.0)
Immature Granulocytes: 0 %
Lymphocytes Relative: 29 %
Lymphs Abs: 2.2 10*3/uL (ref 0.7–4.0)
MCH: 29.5 pg (ref 26.0–34.0)
MCHC: 30.9 g/dL (ref 30.0–36.0)
MCV: 95.4 fL (ref 80.0–100.0)
Monocytes Absolute: 0.7 10*3/uL (ref 0.1–1.0)
Monocytes Relative: 10 %
Neutro Abs: 4.4 10*3/uL (ref 1.7–7.7)
Neutrophils Relative %: 59 %
Platelets: 218 10*3/uL (ref 150–400)
RBC: 4.1 MIL/uL (ref 3.87–5.11)
RDW: 12.7 % (ref 11.5–15.5)
WBC: 7.5 10*3/uL (ref 4.0–10.5)
nRBC: 0 % (ref 0.0–0.2)

## 2019-04-02 LAB — COMPREHENSIVE METABOLIC PANEL
ALT: 20 U/L (ref 0–44)
AST: 22 U/L (ref 15–41)
Albumin: 3.9 g/dL (ref 3.5–5.0)
Alkaline Phosphatase: 74 U/L (ref 38–126)
Anion gap: 8 (ref 5–15)
BUN: 13 mg/dL (ref 8–23)
CO2: 25 mmol/L (ref 22–32)
Calcium: 8.7 mg/dL — ABNORMAL LOW (ref 8.9–10.3)
Chloride: 105 mmol/L (ref 98–111)
Creatinine, Ser: 0.76 mg/dL (ref 0.44–1.00)
GFR calc Af Amer: >60 mL/min (ref >60)
GFR calc non Af Amer: >60 mL/min (ref >60)
Glucose, Bld: 112 mg/dL — ABNORMAL HIGH (ref 70–99)
Potassium: 4.1 mmol/L (ref 3.5–5.1)
Sodium: 138 mmol/L (ref 135–145)
Total Bilirubin: 0.6 mg/dL (ref 0.3–1.2)
Total Protein: 7.1 g/dL (ref 6.5–8.1)

## 2019-04-02 NOTE — Progress Notes (Signed)
Patient stated that at this time she had been doing well.

## 2019-04-03 ENCOUNTER — Inpatient Hospital Stay: Payer: PPO

## 2019-04-03 ENCOUNTER — Inpatient Hospital Stay (HOSPITAL_BASED_OUTPATIENT_CLINIC_OR_DEPARTMENT_OTHER): Payer: PPO | Admitting: Oncology

## 2019-04-03 DIAGNOSIS — Z79811 Long term (current) use of aromatase inhibitors: Secondary | ICD-10-CM | POA: Diagnosis not present

## 2019-04-03 DIAGNOSIS — C50919 Malignant neoplasm of unspecified site of unspecified female breast: Secondary | ICD-10-CM | POA: Diagnosis not present

## 2019-04-04 LAB — CANCER ANTIGEN 15-3: CA 15-3: 29.4 U/mL — ABNORMAL HIGH (ref 0.0–25.0)

## 2019-04-04 LAB — CANCER ANTIGEN 27.29: CA 27.29: 40.4 U/mL — ABNORMAL HIGH (ref 0.0–38.6)

## 2019-04-04 NOTE — Progress Notes (Signed)
I connected with Garnett Farm on 04/04/19 at 11:30 AM EST by video enabled telemedicine visit and verified that I am speaking with the correct person using two identifiers.   I discussed the limitations, risks, security and privacy concerns of performing an evaluation and management service by telemedicine and the availability of in-person appointments. I also discussed with the patient that there may be a patient responsible charge related to this service. The patient expressed understanding and agreed to proceed.  Other persons participating in the visit and their role in the encounter:  Patients son  Patient's location:  home Provider's location:  work  Risk analyst Complaint: Routine follow-up of breast cancer on letrozole  History of present illness: patient is a 79 year old female with a past medical history significant for right breast cancer in 1995. She underwent mastectomy followed by adjuvant chemotherapy and 5 years of tamoxifen. Patient has been having some low back pain and underwent a CT lumbar spine without contrast on 08/10/2017 which was done by the pain clinic. CT mainly showed age-related degenerative disease but also showed incidental nodular thickening of the posterior left diaphragm concerning for tumor and a CT chest was recommended.  CT chest on 08/14/2017 showed scattered left pleural nodularity with tiny left pleural effusion and prominent left internal mammary and juxtadiaphragmatic lymph nodes which are nonspecific but metastatic disease could not be ruled out.  This was followed by a PET/CT scan on 08/22/2017 which showed numerous small left-sided pleural nodules which were mildly hypermetabolic along with hypermetabolic mediastinal lymph nodes concerning for metastatic disease. Small metastatic nodule in the left upper quadrant partly in the retrocrural space. No findings of pulmonary metastatic or osseous metastatic disease.   Patient underwent FNA of retrocrural LN  which was positive for carcinoma but primary could not be ascertained. 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 Patient started taking Ibrance in August 2019  RBC testing showedPIKCA and BRCA mutation  Scans have shown stable to improved disease so far.  Patient was on Ibrance for over 8 months and was stopped in October 2020 given her low-volume disease and only letrozole has been continued   Interval history : Currently patient reports doing well.  She did see urologyGiven her recurrent urinary tract infections and was prescribed topical estrogen.  She has also been having vaginal burning and irritation along with severe urinary urgency and urge incontinence   Review of Systems  Constitutional: Positive for malaise/fatigue. Negative for chills, fever and weight loss.  HENT: Negative for congestion, ear discharge and nosebleeds.   Eyes: Negative for blurred vision.  Respiratory: Negative for cough, hemoptysis, sputum production, shortness of breath and wheezing.   Cardiovascular: Negative for chest pain, palpitations, orthopnea and claudication.  Gastrointestinal: Negative for abdominal pain, blood in stool, constipation, diarrhea, heartburn, melena, nausea and vomiting.  Genitourinary: Positive for urgency. Negative for dysuria, flank pain, frequency and hematuria.  Musculoskeletal: Negative for back pain, joint pain and myalgias.  Skin: Negative for rash.  Neurological: Negative for dizziness, tingling, focal weakness, seizures, weakness and headaches.  Endo/Heme/Allergies: Does not bruise/bleed easily.  Psychiatric/Behavioral: Negative for depression and suicidal ideas. The patient does not have insomnia.     Allergies  Allergen Reactions  . Imipramine Tinitus  . Latex Rash  . Penicillins Rash  . Tape Rash    Past Medical History:  Diagnosis Date  . Anxiety   . Atrial fibrillation ([place])   . Breast  cancer, right ([place]) 1999  RT MASTECTOMY and chemo tx's.   . DDD (degenerative disc disease), cervical    hands and back with arthritis  . Depression   . Diverticulosis   . Diverticulosis   . DVT of lower extremity (deep venous thrombosis) ([place])   . Dyspnea   . Family history of colon cancer   . Family history of lung cancer   . Family history of throat cancer   . GERD (gastroesophageal reflux disease)   . Goiter   . Headache   . History of chemotherapy 2000   BREAST CA  . Hypertension   . Incontinence   . Neuromuscular disorder ([place])    peipheral neuropathy  . OSA (obstructive sleep apnea)   . Osteoporosis   . Personal history of chemotherapy 1999   BREAST CA  . Status post chemotherapy    2000 right breast cancer  . Thyroid disease   . UTI (urinary tract infection)   . Vertigo     Past Surgical History:  Procedure Laterality Date  . ABDOMINAL HYSTERECTOMY    . BREAST BIOPSY Left 2006   negative  . BUNIONECTOMY Bilateral    Screws implanted.  . CHOLECYSTECTOMY    . COCHLEAR IMPLANT Right   . COLONOSCOPY WITH PROPOFOL N/A 07/24/2016   Procedure: COLONOSCOPY WITH PROPOFOL;  Surgeon: Manya Silvas, MD;  Location[facility] ENDOSCOPY;  Service: Endoscopy;  Laterality: N/A;  . ESOPHAGOGASTRODUODENOSCOPY (EGD) WITH PROPOFOL N/A 04/12/2016   Procedure: ESOPHAGOGASTRODUODENOSCOPY (EGD) WITH PROPOFOL;  Surgeon: Manya Silvas, MD;  Location: [facility] ENDOSCOPY;  Service: Endoscopy;  Laterality: N/A;  . EYE SURGERY Bilateral    cataract  . FOOT SURGERY Bilateral   . HAND SURGERY Right   . MASTECTOMY Right 1999   BREAST CA  . MASTECTOMY Right 1999  . PARTIAL COLECTOMY    . THYROIDECTOMY    . TOTAL HIP ARTHROPLASTY Left 03/19/2018   Procedure: TOTAL HIP ARTHROPLASTY LEFT;  Surgeon: Hessie Knows, MD;  Location: [facility] ORS;  Service: Orthopedics;  Laterality: Left;  Marland Kitchen VIDEO ASSISTED THORACOSCOPY Left 09/10/2017   Procedure: VIDEO ASSISTED THORACOSCOPY;  Surgeon: [name], MD;   Location: [facility] ORS;  Service: Thoracic;  Laterality: Left;  with biopsies  . VIDEO BRONCHOSCOPY  09/10/2017   Procedure: VIDEO BRONCHOSCOPY;  Surgeon: [name], MD;  Location: [facility] ORS;  Service: Thoracic;;    Social History   Socioeconomic History  . Marital status: Married    Spouse name: evert  . Number of children: 4  . Years of education: Not on file  . Highest education level: 9th grade  Occupational History  . Not on file  Tobacco Use  . Smoking status: Never Smoker  . Smokeless tobacco: Never Used  Substance and Sexual Activity  . Alcohol use: No    Alcohol/week: 0.0 standard drinks  . Drug use: No  . Sexual activity: Not Currently  Other Topics Concern  . Not on file  Social History Narrative  . Not on file   Social Determinants of Health   Financial Resource Strain:   . Difficulty of Paying Living Expenses: Not on file  Food Insecurity:   . Worried About Charity fundraiser in the Last Year: Not on file  . Ran Out of Food in the Last Year: Not on file  Transportation Needs:   . Lack of Transportation (Medical): Not on file  . Lack of Transportation (Non-Medical): Not on file  Physical Activity:   . Days of Exercise per Week: Not on file  .  Minutes of Exercise per Session: Not on file  Stress:   . Feeling of Stress : Not on file  Social Connections:   . Frequency of Communication with Friends and Family: Not on file  . Frequency of Social Gatherings with Friends and Family: Not on file  . Attends Religious Services: Not on file  . Active Member of Clubs or Organizations: Not on file  . Attends Archivist Meetings: Not on file  . Marital Status: Not on file  Intimate Partner Violence:   . Fear of Current or Ex-Partner: Not on file  . Emotionally Abused: Not on file  . Physically Abused: Not on file  . Sexually Abused: Not on file    Family History  Problem Relation Age of Onset  . Dementia Mother   . Dementia Sister   . Diabetes Sister    . Heart attack Brother 57  . COPD Brother   . Lung cancer Brother        hx smoking  . Colon cancer Brother   . Liver cancer Brother   . COPD Brother   . Lung cancer Brother        hx smoking  . Colon cancer Maternal Grandfather        dx >.50  . Breast cancer Neg Hx      Current Outpatient Medications:  .  acetaminophen (TYLENOL) 325 MG tablet, Take 650 mg by mouth every 4 (four) hours as needed. For pain / increased temp.  May be administered orally,  per G-Tube if needed or rectally if unable to swallow (separate order).  Maximum dose for 24 hours is 3,000 mg from all sources of Acetaminophen / Tylenol, Disp: , Rfl:  .  atorvastatin (LIPITOR) 40 MG tablet, Take 1 tablet (40 mg total) by mouth at bedtime., Disp: 30 tablet, Rfl: 0 .  cholecalciferol (VITAMIN D) 1000 units tablet, Take 1,000 Units by mouth daily., Disp: , Rfl:  .  cyanocobalamin (V-R VITAMIN B-12) 500 MCG tablet, Take 500 mcg by mouth daily. , Disp: , Rfl:  .  DULoxetine (CYMBALTA) 60 MG capsule, Take 1 capsule (60 mg total) by mouth every morning., Disp: 90 capsule, Rfl: 2 .  estradiol (ESTRACE) 0.1 MG/GM vaginal cream, Place 1 Applicatorful vaginally 3 (three) times a week. Twice a week., Disp: 42.5 g, Rfl: 2 .  gabapentin (NEURONTIN) 100 MG capsule, Take 1 capsule by mouth daily., Disp: , Rfl:  .  letrozole (FEMARA) 2.5 MG tablet, TAKE 1 TABLET BY MOUTH EVERY DAY, Disp: 90 tablet, Rfl: 1 .  levothyroxine (SYNTHROID) 100 MCG tablet, Take 1 tablet by mouth daily., Disp: , Rfl:  .  mirabegron ER (MYRBETRIQ) 50 MG TB24 tablet, Take 1 tablet (50 mg total) by mouth daily., Disp: 90 tablet, Rfl: 3 .  ondansetron (ZOFRAN) 4 MG tablet, TAKE 1 TABLET (4 MG TOTAL) BY MOUTH EVERY 6 (SIX) HOURS AS NEEDED FOR NAUSEA OR VOMITING., Disp: 21 tablet, Rfl: 1 .  palbociclib (IBRANCE) 100 MG tablet, Take 1 tablet (100 mg total) by mouth daily. Take for 21 days on, 7 days off, repeat every 28 days., Disp: 21 tablet, Rfl: 4 .   pantoprazole (PROTONIX) 40 MG tablet, Take 1 tablet (40 mg total) by mouth 2 (two) times daily., Disp: 60 tablet, Rfl: 0 .  polyethylene glycol (MIRALAX / GLYCOLAX) packet, Take 17 g by mouth at bedtime., Disp: , Rfl:  .  senna (SENOKOT) 8.6 MG TABS tablet, Take 2 tablets by mouth 2 (two)  times daily., Disp: , Rfl:  .  traZODone (DESYREL) 100 MG tablet, Take 1 tablet (100 mg total) by mouth at bedtime., Disp: 90 tablet, Rfl: 2  No results found.  No images are attached to the encounter.   CMP Latest Ref Rng & Units 04/02/2019  Glucose 70 - 99 mg/dL 112(H)  BUN 8 - 23 mg/dL 13  Creatinine 0.44 - 1.00 mg/dL 0.76  Sodium 135 - 145 mmol/L 138  Potassium 3.5 - 5.1 mmol/L 4.1  Chloride 98 - 111 mmol/L 105  CO2 22 - 32 mmol/L 25  Calcium 8.9 - 10.3 mg/dL 8.7(L)  Total Protein 6.5 - 8.1 g/dL 7.1  Total Bilirubin 0.3 - 1.2 mg/dL 0.6  Alkaline Phos 38 - 126 U/L 74  AST 15 - 41 U/L 22  ALT 0 - 44 U/L 20   CBC Latest Ref Rng & Units 04/02/2019  WBC 4.0 - 10.5 K/uL 7.5  Hemoglobin 12.0 - 15.0 g/dL 12.1  Hematocrit 36.0 - 46.0 % 39.1  Platelets 150 - 400 K/uL 218     Observation/objective: Appears in no acute distress of a video visit today.  Breathing is nonlabored  Assessment and plan: Patient is a 79 y.o. female with stage IV breast cancer ER positive. Patient has low-volume disease with pleural nodules.    She is currently on letrozole and this is a routine follow-up visit  With regards to her metastatic breast cancer she has had low-volume disease with small scattered pleural nodules.  She remains on letrozole single agent.  She was on Ibrance in the past which has been on hold since October 2020To maintain her quality of life and give her a temporary break.  Her tumor markers are presently stable.  I will plan to get repeat CT chest abdomen and pelvis with contrast in about 1 month and see her thereafter to discuss the results of the CT and to decide if we need to restart Ibrance at that  time  Patient has symptoms of urinary urgency and incontinence likely secondary to vaginal atrophy and has been prescribed topical estrogen.  I explained to the patient and her son that typically for estrogen positive breast cancer all forms of estrogen should be avoided.  Topical estrogen has variable systemic absorption which is unpredictable but it does have some absorption.  However patient is elderly And is having significant quality of life issues due to her ongoing vaginal atrophy.  Keeping that in mind it may be reasonable to continue with topical estrogen and see if that improves her overall symptoms.  Follow-up instructions: CT scans in 1 month followed by in person visit  I discussed the assessment and treatment plan with the patient. The patient was provided an opportunity to ask questions and all were answered. The patient agreed with the plan and demonstrated an understanding of the instructions.   The patient was advised to call back or seek an in-person evaluation if the symptoms worsen or if the condition fails to improve as anticipated.    Visit Diagnosis: 1. Metastatic breast cancer ([place])   2. Use of letrozole ([place])     Dr. Randa Evens, MD, MPH [facility] at [facility] Tel- [phone] 04/04/2019 1:01 PM

## 2019-04-09 ENCOUNTER — Other Ambulatory Visit: Payer: Self-pay

## 2019-04-09 ENCOUNTER — Encounter: Payer: Self-pay | Admitting: Psychiatry

## 2019-04-09 ENCOUNTER — Ambulatory Visit (INDEPENDENT_AMBULATORY_CARE_PROVIDER_SITE_OTHER): Payer: PPO | Admitting: Psychiatry

## 2019-04-09 DIAGNOSIS — F3342 Major depressive disorder, recurrent, in full remission: Secondary | ICD-10-CM | POA: Diagnosis not present

## 2019-04-09 DIAGNOSIS — F331 Major depressive disorder, recurrent, moderate: Secondary | ICD-10-CM | POA: Insufficient documentation

## 2019-04-09 DIAGNOSIS — F4310 Post-traumatic stress disorder, unspecified: Secondary | ICD-10-CM

## 2019-04-09 DIAGNOSIS — F419 Anxiety disorder, unspecified: Secondary | ICD-10-CM | POA: Insufficient documentation

## 2019-04-09 MED ORDER — TRAZODONE HCL 100 MG PO TABS: 100.0000 mg | ORAL_TABLET | Freq: Every day | ORAL | 0 refills | Status: DC

## 2019-04-09 MED ORDER — DULOXETINE HCL 60 MG PO CPEP: 60.0000 mg | ORAL_CAPSULE | Freq: Every morning | ORAL | 0 refills | Status: DC

## 2019-04-09 NOTE — Progress Notes (Signed)
[facility] MD OP Progress Note  Virtual Visit via Telephone Note  I connected with Breanna Zhang on 04/09/19 at 10:30 AM EST by telephone and verified that I am speaking with the correct person using two identifiers.    I discussed the limitations, risks, security and privacy concerns of performing an evaluation and management service by telephone and the availability of in person appointments. I also discussed with the patient that there may be a patient responsible charge related to this service. The patient expressed understanding and agreed to proceed.  Of note, pt is hard-of-hearing.   04/09/2019 10:34 AM Breanna Zhang  MRN:  [id number]  Chief Complaint: " I am doing all right."  HPI: Patient reported that she has been doing fine for the most part.  She stated that she has been worried about the ongoing pandemic and the limitations that has improved.  She worries about it but otherwise is doing okay.  She stated that she does need to have someone to talk to him.  She stated that everyone needs some sunshine and hopefully things can get back to normal. She has been taking her medications trazodone and Cymbalta regularly.  She finds trazodone to be very helpful for sleep.  Visit Diagnosis:    ICD-10-CM   1. MDD (major depressive disorder), recurrent, in full remission ([place])  F33.42   2. Post traumatic stress disorder (PTSD)  F43.10     Past Psychiatric History: depression  Past Medical History:  Past Medical History:  Diagnosis Date  . Anxiety   . Atrial fibrillation ([place])   . Breast cancer, right ([place]) 1999   RT MASTECTOMY and chemo tx's.   . DDD (degenerative disc disease), cervical    hands and back with arthritis  . Depression   . Diverticulosis   . Diverticulosis   . DVT of lower extremity (deep venous thrombosis) ([place])   . Dyspnea   . Family history of colon cancer   . Family history of lung cancer   . Family history of throat cancer   . GERD (gastroesophageal reflux  disease)   . Goiter   . Headache   . History of chemotherapy 2000   BREAST CA  . Hypertension   . Incontinence   . Neuromuscular disorder ([place])    peipheral neuropathy  . OSA (obstructive sleep apnea)   . Osteoporosis   . Personal history of chemotherapy 1999   BREAST CA  . Status post chemotherapy    2000 right breast cancer  . Thyroid disease   . UTI (urinary tract infection)   . Vertigo     Past Surgical History:  Procedure Laterality Date  . ABDOMINAL HYSTERECTOMY    . BREAST BIOPSY Left 2006   negative  . BUNIONECTOMY Bilateral    Screws implanted.  . CHOLECYSTECTOMY    . COCHLEAR IMPLANT Right   . COLONOSCOPY WITH PROPOFOL N/A 07/24/2016   Procedure: COLONOSCOPY WITH PROPOFOL;  Surgeon: Manya Silvas, MD;  Location: [facility] ENDOSCOPY;  Service: Endoscopy;  Laterality: N/A;  . ESOPHAGOGASTRODUODENOSCOPY (EGD) WITH PROPOFOL N/A 04/12/2016   Procedure: ESOPHAGOGASTRODUODENOSCOPY (EGD) WITH PROPOFOL;  Surgeon: Manya Silvas, MD;  Location: [facility] ENDOSCOPY;  Service: Endoscopy;  Laterality: N/A;  . EYE SURGERY Bilateral    cataract  . FOOT SURGERY Bilateral   . HAND SURGERY Right   . MASTECTOMY Right 1999   BREAST CA  . MASTECTOMY Right 1999  . PARTIAL COLECTOMY    . THYROIDECTOMY    .  TOTAL HIP ARTHROPLASTY Left 03/19/2018   Procedure: TOTAL HIP ARTHROPLASTY LEFT;  Surgeon: Hessie Knows, MD;  Location: [facility] ORS;  Service: Orthopedics;  Laterality: Left;  Marland Kitchen VIDEO ASSISTED THORACOSCOPY Left 09/10/2017   Procedure: VIDEO ASSISTED THORACOSCOPY;  Surgeon: [name], MD;  Location: [facility] ORS;  Service: Thoracic;  Laterality: Left;  with biopsies  . VIDEO BRONCHOSCOPY  09/10/2017   Procedure: VIDEO BRONCHOSCOPY;  Surgeon: [name], MD;  Location: [facility] ORS;  Service: Thoracic;;    Family Psychiatric History: see below  Family History:  Family History  Problem Relation Age of Onset  . Dementia Mother   . Dementia Sister   . Diabetes Sister   . Heart attack  Brother 76  . COPD Brother   . Lung cancer Brother        hx smoking  . Colon cancer Brother   . Liver cancer Brother   . COPD Brother   . Lung cancer Brother        hx smoking  . Colon cancer Maternal Grandfather        dx >.50  . Breast cancer Neg Hx     Social History:  Social History   Socioeconomic History  . Marital status: Married    Spouse name: evert  . Number of children: 4  . Years of education: Not on file  . Highest education level: 9th grade  Occupational History  . Not on file  Tobacco Use  . Smoking status: Never Smoker  . Smokeless tobacco: Never Used  Substance and Sexual Activity  . Alcohol use: No    Alcohol/week: 0.0 standard drinks  . Drug use: No  . Sexual activity: Not Currently  Other Topics Concern  . Not on file  Social History Narrative  . Not on file   Social Determinants of Health   Financial Resource Strain:   . Difficulty of Paying Living Expenses: Not on file  Food Insecurity:   . Worried About Charity fundraiser in the Last Year: Not on file  . Ran Out of Food in the Last Year: Not on file  Transportation Needs:   . Lack of Transportation (Medical): Not on file  . Lack of Transportation (Non-Medical): Not on file  Physical Activity:   . Days of Exercise per Week: Not on file  . Minutes of Exercise per Session: Not on file  Stress:   . Feeling of Stress : Not on file  Social Connections:   . Frequency of Communication with Friends and Family: Not on file  . Frequency of Social Gatherings with Friends and Family: Not on file  . Attends Religious Services: Not on file  . Active Member of Clubs or Organizations: Not on file  . Attends Archivist Meetings: Not on file  . Marital Status: Not on file    Allergies:  Allergies  Allergen Reactions  . Imipramine Tinitus  . Latex Rash  . Penicillins Rash  . Tape Rash    Metabolic Disorder Labs: No results found for: HGBA1C, MPG No results found for:  PROLACTIN No results found for: CHOL, TRIG, HDL, CHOLHDL, VLDL, LDLCALC Lab Results  Component Value Date   TSH 0.275 (L) 11/09/2012   TSH 0.443 (L) 10/12/2012    Therapeutic Level Labs: No results found for: LITHIUM No results found for: VALPROATE No components found for:  CBMZ  Current Medications: Current Outpatient Medications  Medication Sig Dispense Refill  . acetaminophen (TYLENOL) 325 MG tablet Take 650 mg by  mouth every 4 (four) hours as needed. For pain / increased temp.  May be administered orally,  per G-Tube if needed or rectally if unable to swallow (separate order).  Maximum dose for 24 hours is 3,000 mg from all sources of Acetaminophen / Tylenol    . atorvastatin (LIPITOR) 40 MG tablet Take 1 tablet (40 mg total) by mouth at bedtime. 30 tablet 0  . cholecalciferol (VITAMIN D) 1000 units tablet Take 1,000 Units by mouth daily.    . cyanocobalamin (V-R VITAMIN B-12) 500 MCG tablet Take 500 mcg by mouth daily.     . DULoxetine (CYMBALTA) 60 MG capsule Take 1 capsule (60 mg total) by mouth every morning. 90 capsule 2  . estradiol (ESTRACE) 0.1 MG/GM vaginal cream Place 1 Applicatorful vaginally 3 (three) times a week. Twice a week. 42.5 g 2  . gabapentin (NEURONTIN) 100 MG capsule Take 1 capsule by mouth daily.    Marland Kitchen letrozole (FEMARA) 2.5 MG tablet TAKE 1 TABLET BY MOUTH EVERY DAY 90 tablet 1  . levothyroxine (SYNTHROID) 100 MCG tablet Take 1 tablet by mouth daily.    . mirabegron ER (MYRBETRIQ) 50 MG TB24 tablet Take 1 tablet (50 mg total) by mouth daily. 90 tablet 3  . ondansetron (ZOFRAN) 4 MG tablet TAKE 1 TABLET (4 MG TOTAL) BY MOUTH EVERY 6 (SIX) HOURS AS NEEDED FOR NAUSEA OR VOMITING. 21 tablet 1  . palbociclib (IBRANCE) 100 MG tablet Take 1 tablet (100 mg total) by mouth daily. Take for 21 days on, 7 days off, repeat every 28 days. 21 tablet 4  . pantoprazole (PROTONIX) 40 MG tablet Take 1 tablet (40 mg total) by mouth 2 (two) times daily. 60 tablet 0  . polyethylene  glycol (MIRALAX / GLYCOLAX) packet Take 17 g by mouth at bedtime.    . senna (SENOKOT) 8.6 MG TABS tablet Take 2 tablets by mouth 2 (two) times daily.    . traZODone (DESYREL) 100 MG tablet Take 1 tablet (100 mg total) by mouth at bedtime. 90 tablet 2   No current facility-administered medications for this visit.      Psychiatric Specialty Exam: Review of Systems  There were no vitals taken for this visit.There is no height or weight on file to calculate BMI.  General Appearance: unable to assess due to phone visit  Eye Contact:  Unable to assess due to phone visit  Speech:  Clear and Coherent and Normal Rate  Volume:  Normal  Mood:  Euthymic  Affect:  Congruent  Thought Process:  Goal Directed, Linear and Descriptions of Associations: Intact  Orientation:  Full (Time, Place, and Person)  Thought Content: Logical   Suicidal Thoughts:  No  Homicidal Thoughts:  No  Memory:  Recent;   Good Remote;   Good  Judgement:  Good  Insight:  Good  Psychomotor Activity:  Normal  Concentration:  Concentration: Good and Attention Span: Good  Recall:  Good  Fund of Knowledge: Good  Language: Good  Akathisia:  Negative  Handed:  Right  AIMS (if indicated): not done  Assets:  Communication Skills Desire for Improvement Financial Resources/Insurance Housing  ADL's:  Intact  Cognition: WNL  Sleep:  Good    Assessment and Plan: Patient reported doing well on her current regimen.  She expressed some worries about the ongoing pandemic and its secondary limitations.  1. Post traumatic stress disorder (PTSD)   2. MDD (major depressive disorder), recurrent, in full remission ([place]) - Continue Cymblata 60 mg qam. -  Continue Trazodone 100 mg HS.  Continue same medication regimen. Follow up in 3 months.    [place] Crane, MD 04/09/2019, 10:34 AM

## 2019-04-30 ENCOUNTER — Other Ambulatory Visit: Payer: Self-pay

## 2019-04-30 ENCOUNTER — Ambulatory Visit
Admission: RE | Admit: 2019-04-30 | Discharge: 2019-04-30 | Disposition: A | Discharge status: Home / Self Care | Payer: PPO | Source: Ambulatory Visit | Attending: Oncology | Admitting: Oncology

## 2019-04-30 DIAGNOSIS — I700 Atherosclerosis of aorta: Secondary | ICD-10-CM | POA: Diagnosis not present

## 2019-04-30 DIAGNOSIS — K5730 Diverticulosis of large intestine without perforation or abscess without bleeding: Secondary | ICD-10-CM | POA: Diagnosis not present

## 2019-04-30 DIAGNOSIS — K449 Diaphragmatic hernia without obstruction or gangrene: Secondary | ICD-10-CM | POA: Insufficient documentation

## 2019-04-30 DIAGNOSIS — R918 Other nonspecific abnormal finding of lung field: Secondary | ICD-10-CM | POA: Diagnosis not present

## 2019-04-30 DIAGNOSIS — C50919 Malignant neoplasm of unspecified site of unspecified female breast: Secondary | ICD-10-CM | POA: Diagnosis not present

## 2019-04-30 DIAGNOSIS — R1032 Left lower quadrant pain: Secondary | ICD-10-CM | POA: Diagnosis not present

## 2019-04-30 MED ORDER — IOHEXOL 300 MG/ML  SOLN
100.0000 mL | Freq: Once | INTRAMUSCULAR | Status: AC | PRN
  Administered 2019-04-30: 10:00:00 100 mL via INTRAVENOUS

## 2019-05-02 ENCOUNTER — Inpatient Hospital Stay: Payer: PPO | Attending: Oncology | Admitting: Oncology

## 2019-05-02 ENCOUNTER — Encounter: Payer: Self-pay | Admitting: Oncology

## 2019-05-02 ENCOUNTER — Other Ambulatory Visit: Payer: Self-pay

## 2019-05-02 DIAGNOSIS — Z79811 Long term (current) use of aromatase inhibitors: Secondary | ICD-10-CM

## 2019-05-02 DIAGNOSIS — C50912 Malignant neoplasm of unspecified site of left female breast: Secondary | ICD-10-CM

## 2019-05-02 NOTE — Progress Notes (Signed)
Patient stated that she had been with poor appetite, weak, fatigued, dizzy and feeling depressed due to her way of feeling.

## 2019-05-03 DIAGNOSIS — G4733 Obstructive sleep apnea (adult) (pediatric): Secondary | ICD-10-CM | POA: Diagnosis not present

## 2019-05-05 NOTE — Progress Notes (Signed)
I connected with Breanna Zhang on 05/05/19 at  1:00 PM EST by video enabled telemedicine visit and verified that I am speaking with the correct person using two identifiers.   I discussed the limitations, risks, security and privacy concerns of performing an evaluation and management service by telemedicine and the availability of in-person appointments. I also discussed with the patient that there may be a patient responsible charge related to this service. The patient expressed understanding and agreed to proceed.  Other persons participating in the visit and their role in the encounter:  Patients daughter  Patient's location:  home Provider's location:  work  Risk analyst Complaint:  Discuss CT scan results  History of present illness: patient is a 79 year old female with a past medical history significant for right breast cancer in 1995. She underwent mastectomy followed by adjuvant chemotherapy and 5 years of tamoxifen. Patient has been having some low back pain and underwent a CT lumbar spine without contrast on 08/10/2017 which was done by the pain clinic. CT mainly showed age-related degenerative disease but also showed incidental nodular thickening of the posterior left diaphragm concerning for tumor and a CT chest was recommended.  CT chest on 08/14/2017 showed scattered left pleural nodularity with tiny left pleural effusion and prominent left internal mammary and juxtadiaphragmatic lymph nodes which are nonspecific but metastatic disease could not be ruled out.  This was followed by a PET/CT scan on 08/22/2017 which showed numerous small left-sided pleural nodules which were mildly hypermetabolic along with hypermetabolic mediastinal lymph nodes concerning for metastatic disease. Small metastatic nodule in the left upper quadrant partly in the retrocrural space. No findings of pulmonary metastatic or osseous metastatic disease.   Patient underwent FNA of retrocrural LN which was positive  for carcinoma but primary could not be ascertained. 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 Patient started taking Ibrance in August 2019  RBC testing showedPIKCA and BRCA mutation  Scans have shown stable to improved disease so far.  Patient was on Ibrance for over 8 months and was stopped in October 2020 given her low-volume disease and only letrozole has been continued  Interval history: Reports tolerating letrozole well.  She is on topical estrogen for atrophic vaginitis and since starting that she denies having any recurrent episodes of UTI.   Review of Systems  Constitutional: Positive for malaise/fatigue. Negative for chills, fever and weight loss.  HENT: Negative for congestion, ear discharge and nosebleeds.   Eyes: Negative for blurred vision.  Respiratory: Negative for cough, hemoptysis, sputum production, shortness of breath and wheezing.   Cardiovascular: Negative for chest pain, palpitations, orthopnea and claudication.  Gastrointestinal: Negative for abdominal pain, blood in stool, constipation, diarrhea, heartburn, melena, nausea and vomiting.  Genitourinary: Negative for dysuria, flank pain, frequency, hematuria and urgency.  Musculoskeletal: Positive for back pain. Negative for joint pain and myalgias.  Skin: Negative for rash.  Neurological: Negative for dizziness, tingling, focal weakness, seizures, weakness and headaches.  Endo/Heme/Allergies: Does not bruise/bleed easily.  Psychiatric/Behavioral: Negative for depression and suicidal ideas. The patient does not have insomnia.     Allergies  Allergen Reactions  . Imipramine Tinitus  . Latex Rash  . Penicillins Rash  . Tape Rash    Past Medical History:  Diagnosis Date  . Anxiety   . Atrial fibrillation ([place])   . Breast cancer, right ([place]) 1999   RT MASTECTOMY and chemo tx's.   . DDD (degenerative disc disease), cervical  hands and  back with arthritis  . Depression   . Diverticulosis   . Diverticulosis   . DVT of lower extremity (deep venous thrombosis) ([place])   . Dyspnea   . Family history of colon cancer   . Family history of lung cancer   . Family history of throat cancer   . GERD (gastroesophageal reflux disease)   . Goiter   . Headache   . History of chemotherapy 2000   BREAST CA  . Hypertension   . Incontinence   . Neuromuscular disorder ([place])    peipheral neuropathy  . OSA (obstructive sleep apnea)   . Osteoporosis   . Personal history of chemotherapy 1999   BREAST CA  . Status post chemotherapy    2000 right breast cancer  . Thyroid disease   . UTI (urinary tract infection)   . Vertigo     Past Surgical History:  Procedure Laterality Date  . ABDOMINAL HYSTERECTOMY    . BREAST BIOPSY Left 2006   negative  . BUNIONECTOMY Bilateral    Screws implanted.  . CHOLECYSTECTOMY    . COCHLEAR IMPLANT Right   . COLONOSCOPY WITH PROPOFOL N/A 07/24/2016   Procedure: COLONOSCOPY WITH PROPOFOL;  Surgeon: Manya Silvas, MD;  Location: [facility] ENDOSCOPY;  Service: Endoscopy;  Laterality: N/A;  . ESOPHAGOGASTRODUODENOSCOPY (EGD) WITH PROPOFOL N/A 04/12/2016   Procedure: ESOPHAGOGASTRODUODENOSCOPY (EGD) WITH PROPOFOL;  Surgeon: Manya Silvas, MD;  Location: [facility] ENDOSCOPY;  Service: Endoscopy;  Laterality: N/A;  . EYE SURGERY Bilateral    cataract  . FOOT SURGERY Bilateral   . HAND SURGERY Right   . MASTECTOMY Right 1999   BREAST CA  . MASTECTOMY Right 1999  . PARTIAL COLECTOMY    . THYROIDECTOMY    . TOTAL HIP ARTHROPLASTY Left 03/19/2018   Procedure: TOTAL HIP ARTHROPLASTY LEFT;  Surgeon: Hessie Knows, MD;  Location: [facility] ORS;  Service: Orthopedics;  Laterality: Left;  Marland Kitchen VIDEO ASSISTED THORACOSCOPY Left 09/10/2017   Procedure: VIDEO ASSISTED THORACOSCOPY;  Surgeon: [name], MD;  Location: [facility] ORS;  Service: Thoracic;  Laterality: Left;  with biopsies  . VIDEO BRONCHOSCOPY  09/10/2017    Procedure: VIDEO BRONCHOSCOPY;  Surgeon: [name], MD;  Location: [facility] ORS;  Service: Thoracic;;    Social History   Socioeconomic History  . Marital status: Married    Spouse name: evert  . Number of children: 4  . Years of education: Not on file  . Highest education level: 9th grade  Occupational History  . Not on file  Tobacco Use  . Smoking status: Never Smoker  . Smokeless tobacco: Never Used  Substance and Sexual Activity  . Alcohol use: No    Alcohol/week: 0.0 standard drinks  . Drug use: No  . Sexual activity: Not Currently  Other Topics Concern  . Not on file  Social History Narrative  . Not on file   Social Determinants of Health   Financial Resource Strain:   . Difficulty of Paying Living Expenses: Not on file  Food Insecurity:   . Worried About Charity fundraiser in the Last Year: Not on file  . Ran Out of Food in the Last Year: Not on file  Transportation Needs:   . Lack of Transportation (Medical): Not on file  . Lack of Transportation (Non-Medical): Not on file  Physical Activity:   . Days of Exercise per Week: Not on file  . Minutes of Exercise per Session: Not on file  Stress:   .  Feeling of Stress : Not on file  Social Connections:   . Frequency of Communication with Friends and Family: Not on file  . Frequency of Social Gatherings with Friends and Family: Not on file  . Attends Religious Services: Not on file  . Active Member of Clubs or Organizations: Not on file  . Attends Archivist Meetings: Not on file  . Marital Status: Not on file  Intimate Partner Violence:   . Fear of Current or Ex-Partner: Not on file  . Emotionally Abused: Not on file  . Physically Abused: Not on file  . Sexually Abused: Not on file    Family History  Problem Relation Age of Onset  . Dementia Mother   . Dementia Sister   . Diabetes Sister   . Heart attack Brother 54  . COPD Brother   . Lung cancer Brother        hx smoking  . Colon cancer  Brother   . Liver cancer Brother   . COPD Brother   . Lung cancer Brother        hx smoking  . Colon cancer Maternal Grandfather        dx >.50  . Breast cancer Neg Hx      Current Outpatient Medications:  .  acetaminophen (TYLENOL) 325 MG tablet, Take 650 mg by mouth every 4 (four) hours as needed. For pain / increased temp.  May be administered orally,  per G-Tube if needed or rectally if unable to swallow (separate order).  Maximum dose for 24 hours is 3,000 mg from all sources of Acetaminophen / Tylenol, Disp: , Rfl:  .  atorvastatin (LIPITOR) 40 MG tablet, Take 1 tablet (40 mg total) by mouth at bedtime., Disp: 30 tablet, Rfl: 0 .  cholecalciferol (VITAMIN D) 1000 units tablet, Take 1,000 Units by mouth daily., Disp: , Rfl:  .  cyanocobalamin (V-R VITAMIN B-12) 500 MCG tablet, Take 500 mcg by mouth daily. , Disp: , Rfl:  .  DULoxetine (CYMBALTA) 60 MG capsule, Take 1 capsule (60 mg total) by mouth every morning., Disp: 90 capsule, Rfl: 0 .  estradiol (ESTRACE) 0.1 MG/GM vaginal cream, Place 1 Applicatorful vaginally 3 (three) times a week. Twice a week., Disp: 42.5 g, Rfl: 2 .  gabapentin (NEURONTIN) 100 MG capsule, Take 1 capsule by mouth daily., Disp: , Rfl:  .  letrozole (FEMARA) 2.5 MG tablet, TAKE 1 TABLET BY MOUTH EVERY DAY, Disp: 90 tablet, Rfl: 1 .  levothyroxine (SYNTHROID) 100 MCG tablet, Take 1 tablet by mouth daily., Disp: , Rfl:  .  mirabegron ER (MYRBETRIQ) 50 MG TB24 tablet, Take 1 tablet (50 mg total) by mouth daily., Disp: 90 tablet, Rfl: 3 .  ondansetron (ZOFRAN) 4 MG tablet, TAKE 1 TABLET (4 MG TOTAL) BY MOUTH EVERY 6 (SIX) HOURS AS NEEDED FOR NAUSEA OR VOMITING., Disp: 21 tablet, Rfl: 1 .  pantoprazole (PROTONIX) 40 MG tablet, Take 1 tablet (40 mg total) by mouth 2 (two) times daily., Disp: 60 tablet, Rfl: 0 .  polyethylene glycol (MIRALAX / GLYCOLAX) packet, Take 17 g by mouth at bedtime., Disp: , Rfl:  .  senna (SENOKOT) 8.6 MG TABS tablet, Take 2 tablets by mouth  2 (two) times daily., Disp: , Rfl:  .  traZODone (DESYREL) 100 MG tablet, Take 1 tablet (100 mg total) by mouth at bedtime., Disp: 90 tablet, Rfl: 0 .  palbociclib (IBRANCE) 100 MG tablet, Take 1 tablet (100 mg total) by mouth daily. Take for  21 days on, 7 days off, repeat every 28 days. (Patient not taking: Reported on 05/02/2019), Disp: 21 tablet, Rfl: 4  CT Chest W Contrast  Result Date: 04/30/2019 CLINICAL DATA:  Restaging metastatic breast cancer. Left lower quadrant abdominal pain EXAM: CT CHEST, ABDOMEN, AND PELVIS WITH CONTRAST TECHNIQUE: Multidetector CT imaging of the chest, abdomen and pelvis was performed following the standard protocol during bolus administration of intravenous contrast. CONTRAST:  160m OMNIPAQUE IOHEXOL 300 MG/ML  SOLN COMPARISON:  Multiple exams, including 12/27/2018 FINDINGS: CT CHEST FINDINGS Cardiovascular: Atherosclerotic calcification of the aortic arch branch vessels. Calcification along the posterior leaflet of the mitral valve. Mediastinum/Nodes: Small to moderate-sized type 1 hiatal hernia. Mild distal esophageal wall thickening, the most common cause would be esophagitis. No pathologic adenopathy observed. Lungs/Pleura: Biapical pleuroparenchymal scarring. Stable mild scarring anteriorly in the right lower lobe. Probable subpleural lymph node 2 mm in short axis along the major fissure, image 76/3, stable. Mild lingular scarring. Minimal scarring posteromedially in the left lower lobe. Stable mild bilateral pleural irregularity particularly along the upper lobes. One larger area of nodularity along the lateral pleural margin of the upper portion of the major fissure measures 1.2 by 0.7 cm, formerly the same. No new or progressive findings. Musculoskeletal: Midthoracic spondylosis. Right mastectomy. CT ABDOMEN PELVIS FINDINGS Hepatobiliary: Unremarkable Pancreas: Unremarkable Spleen: Unremarkable Adrenals/Urinary Tract: Non rotated right kidney. The kidneys appear  otherwise unremarkable. Adrenal glands normal. Urinary bladder unremarkable. High density along the urethra probably from prior incontinence therapy or injections, alternatively stones within a urethral diverticulum, correlate with procedural history. Stomach/Bowel: Moderate-sized hiatal hernia. Postoperative findings in the right colon. Prominent stool throughout the colon favors constipation. Mild sigmoid colon diverticulosis. Vascular/Lymphatic: Aortoiliac atherosclerotic vascular disease. No pathologic adenopathy. Reproductive: Uterus absent. Adnexa unremarkable. Other: No supplemental non-categorized findings. Musculoskeletal: Left total hip prosthesis. No surrounding bony erosions to suggest particulate disease, no surrounding abnormal fluid collections. IMPRESSION: 1. No compelling findings of active malignancy. Stable mild pleural nodularity on the left. 2. Other imaging findings of potential clinical significance: Small to moderate-sized type 1 hiatal hernia. Mild distal esophageal wall thickening, the most common cause would be esophagitis. Non rotated right kidney. Prominent stool throughout the colon favors constipation. Mild sigmoid colon diverticulosis. High density along the urethra probably from prior incontinence therapy or injections, alternatively stones within a urethral diverticulum, correlate with procedural history. Mild mitral valve calcification. Aortic Atherosclerosis (ICD10-I70.0). Electronically Signed   By: WVan ClinesM.D.   On: 04/30/2019 12:44   CT ABDOMEN PELVIS W CONTRAST  Result Date: 04/30/2019 CLINICAL DATA:  Restaging metastatic breast cancer. Left lower quadrant abdominal pain EXAM: CT CHEST, ABDOMEN, AND PELVIS WITH CONTRAST TECHNIQUE: Multidetector CT imaging of the chest, abdomen and pelvis was performed following the standard protocol during bolus administration of intravenous contrast. CONTRAST:  1026mOMNIPAQUE IOHEXOL 300 MG/ML  SOLN COMPARISON:  Multiple  exams, including 12/27/2018 FINDINGS: CT CHEST FINDINGS Cardiovascular: Atherosclerotic calcification of the aortic arch branch vessels. Calcification along the posterior leaflet of the mitral valve. Mediastinum/Nodes: Small to moderate-sized type 1 hiatal hernia. Mild distal esophageal wall thickening, the most common cause would be esophagitis. No pathologic adenopathy observed. Lungs/Pleura: Biapical pleuroparenchymal scarring. Stable mild scarring anteriorly in the right lower lobe. Probable subpleural lymph node 2 mm in short axis along the major fissure, image 76/3, stable. Mild lingular scarring. Minimal scarring posteromedially in the left lower lobe. Stable mild bilateral pleural irregularity particularly along the upper lobes. One larger area of nodularity along the lateral pleural  margin of the upper portion of the major fissure measures 1.2 by 0.7 cm, formerly the same. No new or progressive findings. Musculoskeletal: Midthoracic spondylosis. Right mastectomy. CT ABDOMEN PELVIS FINDINGS Hepatobiliary: Unremarkable Pancreas: Unremarkable Spleen: Unremarkable Adrenals/Urinary Tract: Non rotated right kidney. The kidneys appear otherwise unremarkable. Adrenal glands normal. Urinary bladder unremarkable. High density along the urethra probably from prior incontinence therapy or injections, alternatively stones within a urethral diverticulum, correlate with procedural history. Stomach/Bowel: Moderate-sized hiatal hernia. Postoperative findings in the right colon. Prominent stool throughout the colon favors constipation. Mild sigmoid colon diverticulosis. Vascular/Lymphatic: Aortoiliac atherosclerotic vascular disease. No pathologic adenopathy. Reproductive: Uterus absent. Adnexa unremarkable. Other: No supplemental non-categorized findings. Musculoskeletal: Left total hip prosthesis. No surrounding bony erosions to suggest particulate disease, no surrounding abnormal fluid collections. IMPRESSION: 1. No  compelling findings of active malignancy. Stable mild pleural nodularity on the left. 2. Other imaging findings of potential clinical significance: Small to moderate-sized type 1 hiatal hernia. Mild distal esophageal wall thickening, the most common cause would be esophagitis. Non rotated right kidney. Prominent stool throughout the colon favors constipation. Mild sigmoid colon diverticulosis. High density along the urethra probably from prior incontinence therapy or injections, alternatively stones within a urethral diverticulum, correlate with procedural history. Mild mitral valve calcification. Aortic Atherosclerosis (ICD10-I70.0). Electronically Signed   By: Van Clines M.D.   On: 04/30/2019 12:44    No images are attached to the encounter.   CMP Latest Ref Rng & Units 04/02/2019  Glucose 70 - 99 mg/dL 112(H)  BUN 8 - 23 mg/dL 13  Creatinine 0.44 - 1.00 mg/dL 0.76  Sodium 135 - 145 mmol/L 138  Potassium 3.5 - 5.1 mmol/L 4.1  Chloride 98 - 111 mmol/L 105  CO2 22 - 32 mmol/L 25  Calcium 8.9 - 10.3 mg/dL 8.7(L)  Total Protein 6.5 - 8.1 g/dL 7.1  Total Bilirubin 0.3 - 1.2 mg/dL 0.6  Alkaline Phos 38 - 126 U/L 74  AST 15 - 41 U/L 22  ALT 0 - 44 U/L 20   CBC Latest Ref Rng & Units 04/02/2019  WBC 4.0 - 10.5 K/uL 7.5  Hemoglobin 12.0 - 15.0 g/dL 12.1  Hematocrit 36.0 - 46.0 % 39.1  Platelets 150 - 400 K/uL 218     Observation/objective: Appears in no acute distress on video visit today.  Breathing is nonlabored  Assessment and plan:Patient is a52 y.o.femalewith stage IV breast cancer ER positive. Patient has low-volume disease with pleural nodules. She is currently on letrozole and this is a visit to discuss CT scan results  I have reviewed CT chest abdomen and pelvis images independently and discussed findings with the patient. Overall patient has stable disease given that her pleural nodules as well as subpleural lymph nodes are overall stable.  No concerning findings of  recurrent or progressive disease.  She will continue taking letrozole single agent and if there is any concern for disease progression in the future I will add Ibrance back on.  Tumor markers have been overall stable.  Clinically she is doing well and no concerning signs and symptoms of recurrence  Follow-up instructions: I will see the patient back in 3 months time with CBC with differential, CMP, CA 27-29 and CA 15-3 for in person visit  I discussed the assessment and treatment plan with the patient. The patient was provided an opportunity to ask questions and all were answered. The patient agreed with the plan and demonstrated an understanding of the instructions.   The patient was advised  to call back or seek an in-person evaluation if the symptoms worsen or if the condition fails to improve as anticipated.   Visit Diagnosis: 1. Malignant neoplasm of left female breast, unspecified estrogen receptor status, unspecified site of breast ([place])   2. Use of letrozole ([place])     Dr. Randa Evens, MD, MPH [facility] Tel- [phone] 05/05/2019 12:55 PM

## 2019-05-15 DIAGNOSIS — G4733 Obstructive sleep apnea (adult) (pediatric): Secondary | ICD-10-CM | POA: Diagnosis not present

## 2019-05-16 DIAGNOSIS — I10 Essential (primary) hypertension: Secondary | ICD-10-CM | POA: Diagnosis not present

## 2019-05-16 DIAGNOSIS — E034 Atrophy of thyroid (acquired): Secondary | ICD-10-CM | POA: Diagnosis not present

## 2019-05-16 DIAGNOSIS — K219 Gastro-esophageal reflux disease without esophagitis: Secondary | ICD-10-CM | POA: Diagnosis not present

## 2019-05-16 DIAGNOSIS — E785 Hyperlipidemia, unspecified: Secondary | ICD-10-CM | POA: Diagnosis not present

## 2019-05-16 DIAGNOSIS — R739 Hyperglycemia, unspecified: Secondary | ICD-10-CM | POA: Diagnosis not present

## 2019-05-16 DIAGNOSIS — M8949 Other hypertrophic osteoarthropathy, multiple sites: Secondary | ICD-10-CM | POA: Diagnosis not present

## 2019-05-21 DIAGNOSIS — H353211 Exudative age-related macular degeneration, right eye, with active choroidal neovascularization: Secondary | ICD-10-CM | POA: Diagnosis not present

## 2019-05-23 DIAGNOSIS — M5136 Other intervertebral disc degeneration, lumbar region: Secondary | ICD-10-CM | POA: Diagnosis not present

## 2019-05-23 DIAGNOSIS — C7802 Secondary malignant neoplasm of left lung: Secondary | ICD-10-CM | POA: Diagnosis not present

## 2019-05-23 DIAGNOSIS — C50919 Malignant neoplasm of unspecified site of unspecified female breast: Secondary | ICD-10-CM | POA: Diagnosis not present

## 2019-05-23 DIAGNOSIS — R739 Hyperglycemia, unspecified: Secondary | ICD-10-CM | POA: Diagnosis not present

## 2019-05-23 DIAGNOSIS — I10 Essential (primary) hypertension: Secondary | ICD-10-CM | POA: Diagnosis not present

## 2019-05-23 DIAGNOSIS — K219 Gastro-esophageal reflux disease without esophagitis: Secondary | ICD-10-CM | POA: Diagnosis not present

## 2019-05-23 DIAGNOSIS — E785 Hyperlipidemia, unspecified: Secondary | ICD-10-CM | POA: Diagnosis not present

## 2019-05-23 DIAGNOSIS — M8949 Other hypertrophic osteoarthropathy, multiple sites: Secondary | ICD-10-CM | POA: Diagnosis not present

## 2019-05-23 DIAGNOSIS — F3342 Major depressive disorder, recurrent, in full remission: Secondary | ICD-10-CM | POA: Diagnosis not present

## 2019-05-23 DIAGNOSIS — E034 Atrophy of thyroid (acquired): Secondary | ICD-10-CM | POA: Diagnosis not present

## 2019-05-26 DIAGNOSIS — M5136 Other intervertebral disc degeneration, lumbar region: Secondary | ICD-10-CM | POA: Diagnosis not present

## 2019-05-28 DIAGNOSIS — M5136 Other intervertebral disc degeneration, lumbar region: Secondary | ICD-10-CM | POA: Diagnosis not present

## 2019-05-31 DIAGNOSIS — G4733 Obstructive sleep apnea (adult) (pediatric): Secondary | ICD-10-CM | POA: Diagnosis not present

## 2019-06-02 DIAGNOSIS — M5136 Other intervertebral disc degeneration, lumbar region: Secondary | ICD-10-CM | POA: Diagnosis not present

## 2019-06-09 DIAGNOSIS — H903 Sensorineural hearing loss, bilateral: Secondary | ICD-10-CM | POA: Diagnosis not present

## 2019-06-17 DIAGNOSIS — M25552 Pain in left hip: Secondary | ICD-10-CM | POA: Diagnosis not present

## 2019-06-17 DIAGNOSIS — M8949 Other hypertrophic osteoarthropathy, multiple sites: Secondary | ICD-10-CM | POA: Diagnosis not present

## 2019-06-17 DIAGNOSIS — C7802 Secondary malignant neoplasm of left lung: Secondary | ICD-10-CM | POA: Diagnosis not present

## 2019-06-17 DIAGNOSIS — Z96642 Presence of left artificial hip joint: Secondary | ICD-10-CM | POA: Diagnosis not present

## 2019-06-17 DIAGNOSIS — E034 Atrophy of thyroid (acquired): Secondary | ICD-10-CM | POA: Diagnosis not present

## 2019-06-17 DIAGNOSIS — M5416 Radiculopathy, lumbar region: Secondary | ICD-10-CM | POA: Diagnosis not present

## 2019-06-17 DIAGNOSIS — I10 Essential (primary) hypertension: Secondary | ICD-10-CM | POA: Diagnosis not present

## 2019-06-17 DIAGNOSIS — Z471 Aftercare following joint replacement surgery: Secondary | ICD-10-CM | POA: Diagnosis not present

## 2019-06-19 DIAGNOSIS — M7072 Other bursitis of hip, left hip: Secondary | ICD-10-CM | POA: Diagnosis not present

## 2019-06-24 DIAGNOSIS — M7072 Other bursitis of hip, left hip: Secondary | ICD-10-CM | POA: Diagnosis not present

## 2019-06-24 DIAGNOSIS — M25552 Pain in left hip: Secondary | ICD-10-CM | POA: Diagnosis not present

## 2019-07-01 DIAGNOSIS — G4733 Obstructive sleep apnea (adult) (pediatric): Secondary | ICD-10-CM | POA: Diagnosis not present

## 2019-07-02 DIAGNOSIS — H353221 Exudative age-related macular degeneration, left eye, with active choroidal neovascularization: Secondary | ICD-10-CM | POA: Diagnosis not present

## 2019-07-04 DIAGNOSIS — Z96642 Presence of left artificial hip joint: Secondary | ICD-10-CM | POA: Diagnosis not present

## 2019-07-04 DIAGNOSIS — M7072 Other bursitis of hip, left hip: Secondary | ICD-10-CM | POA: Diagnosis not present

## 2019-07-04 DIAGNOSIS — M25552 Pain in left hip: Secondary | ICD-10-CM | POA: Diagnosis not present

## 2019-07-08 ENCOUNTER — Telehealth: Payer: PPO | Admitting: Psychiatry

## 2019-07-14 DIAGNOSIS — I10 Essential (primary) hypertension: Secondary | ICD-10-CM | POA: Diagnosis not present

## 2019-07-14 DIAGNOSIS — E785 Hyperlipidemia, unspecified: Secondary | ICD-10-CM | POA: Diagnosis not present

## 2019-07-14 DIAGNOSIS — F3342 Major depressive disorder, recurrent, in full remission: Secondary | ICD-10-CM | POA: Diagnosis not present

## 2019-07-14 DIAGNOSIS — M519 Unspecified thoracic, thoracolumbar and lumbosacral intervertebral disc disorder: Secondary | ICD-10-CM | POA: Diagnosis not present

## 2019-07-14 DIAGNOSIS — M5416 Radiculopathy, lumbar region: Secondary | ICD-10-CM | POA: Diagnosis not present

## 2019-07-14 DIAGNOSIS — G4733 Obstructive sleep apnea (adult) (pediatric): Secondary | ICD-10-CM | POA: Diagnosis not present

## 2019-07-14 DIAGNOSIS — K219 Gastro-esophageal reflux disease without esophagitis: Secondary | ICD-10-CM | POA: Diagnosis not present

## 2019-07-14 DIAGNOSIS — C7802 Secondary malignant neoplasm of left lung: Secondary | ICD-10-CM | POA: Diagnosis not present

## 2019-07-14 DIAGNOSIS — R739 Hyperglycemia, unspecified: Secondary | ICD-10-CM | POA: Diagnosis not present

## 2019-07-14 DIAGNOSIS — E034 Atrophy of thyroid (acquired): Secondary | ICD-10-CM | POA: Diagnosis not present

## 2019-07-14 DIAGNOSIS — C50919 Malignant neoplasm of unspecified site of unspecified female breast: Secondary | ICD-10-CM | POA: Diagnosis not present

## 2019-07-15 ENCOUNTER — Other Ambulatory Visit: Payer: Self-pay

## 2019-07-15 ENCOUNTER — Telehealth (INDEPENDENT_AMBULATORY_CARE_PROVIDER_SITE_OTHER): Payer: PPO | Admitting: Psychiatry

## 2019-07-15 ENCOUNTER — Encounter: Payer: Self-pay | Admitting: Psychiatry

## 2019-07-15 DIAGNOSIS — F4310 Post-traumatic stress disorder, unspecified: Secondary | ICD-10-CM

## 2019-07-15 DIAGNOSIS — F3342 Major depressive disorder, recurrent, in full remission: Secondary | ICD-10-CM

## 2019-07-15 MED ORDER — DULOXETINE HCL 60 MG PO CPEP: 60.0000 mg | ORAL_CAPSULE | Freq: Every morning | ORAL | 0 refills | Status: DC

## 2019-07-15 MED ORDER — TRAZODONE HCL 100 MG PO TABS: 100.0000 mg | ORAL_TABLET | Freq: Every day | ORAL | 0 refills | Status: DC

## 2019-07-15 MED ORDER — BUSPIRONE HCL 10 MG PO TABS: 10.0000 mg | ORAL_TABLET | Freq: Two times a day (BID) | ORAL | 0 refills | Status: DC

## 2019-07-15 NOTE — Progress Notes (Signed)
[facility] MD OP Progress Note  Virtual Visit via Telephone Note  I connected with LUCELY LEARD and her daughter on 07/15/19 at  2:00 PM EDT by telephone and verified that I am speaking with the correct person using two identifiers.    I discussed the limitations, risks, security and privacy concerns of performing an evaluation and management service by telephone and the availability of in person appointments. I also discussed with the patient that there may be a patient responsible charge related to this service. The patient and her daughter expressed understanding and agreed to proceed.  Of note, pt is hard-of-hearing.  I provided 18 minutes of non-face-to-face time during this encounter.   07/15/2019 1:57 PM [name]  MRN:  [id number]  Chief Complaint: " My nerves are bad."  HPI: Patient reported that she was doing well up until a few days ago when she saw her primary care doctor.  She informed that she has been recently informed that she has arthritis which is chronic and unfortunately it may not get much better and that she may have to live in chronic pain.  She has also been instructed not to ambulate without her walker as she is at high risk of falls.  Patient stated that she is very upset about this and cannot believe that she cannot take care of her routine activities as she used to.  She stated that she still dealing with chronic pain which impacts her sleep at times.  She stated that she has been feeling very anxious due to this negative news and asked if she could be prescribed something to help with that.  Patient was asked if she has ever taken buspirone and she recalls being prescribed back in 2019.  She stated that when she tried it then she did not find it to be very helpful.  Writer informed her that she was tried at a low-dose of 10 mg once a day and maybe if we try it at 10 mg twice a day she can get better relief.  Writer informed her that buspirone would be a better option  compared to other anxiety medications as it has lower sedating properties and therefore has a low risk of resulting in falls. Patient also informed that she got her 2 doses of COVID-19 vaccination recently. Patient and her daughter were agreeable to try it. Supportive psychotherapy provided during the session.   Visit Diagnosis:    ICD-10-CM   1. MDD (major depressive disorder), recurrent, in full remission ([place])  F33.42   2. Post traumatic stress disorder (PTSD)  F43.10     Past Psychiatric History: depression, PTSD  Past Medical History:  Past Medical History:  Diagnosis Date  . Anxiety   . Atrial fibrillation ([place])   . Breast cancer, right ([place]) 1999   RT MASTECTOMY and chemo tx's.   . DDD (degenerative disc disease), cervical    hands and back with arthritis  . Depression   . Diverticulosis   . Diverticulosis   . DVT of lower extremity (deep venous thrombosis) ([place])   . Dyspnea   . Family history of colon cancer   . Family history of lung cancer   . Family history of throat cancer   . GERD (gastroesophageal reflux disease)   . Goiter   . Headache   . History of chemotherapy 2000   BREAST CA  . Hypertension   . Incontinence   . Neuromuscular disorder ([place])    peipheral neuropathy  .  OSA (obstructive sleep apnea)   . Osteoporosis   . Personal history of chemotherapy 1999   BREAST CA  . Status post chemotherapy    2000 right breast cancer  . Thyroid disease   . UTI (urinary tract infection)   . Vertigo     Past Surgical History:  Procedure Laterality Date  . ABDOMINAL HYSTERECTOMY    . BREAST BIOPSY Left 2006   negative  . BUNIONECTOMY Bilateral    Screws implanted.  . CHOLECYSTECTOMY    . COCHLEAR IMPLANT Right   . COLONOSCOPY WITH PROPOFOL N/A 07/24/2016   Procedure: COLONOSCOPY WITH PROPOFOL;  Surgeon: Manya Silvas, MD;  Location: [facility] ENDOSCOPY;  Service: Endoscopy;  Laterality: N/A;  . ESOPHAGOGASTRODUODENOSCOPY (EGD) WITH PROPOFOL N/A 04/12/2016    Procedure: ESOPHAGOGASTRODUODENOSCOPY (EGD) WITH PROPOFOL;  Surgeon: Manya Silvas, MD;  Location: [facility] ENDOSCOPY;  Service: Endoscopy;  Laterality: N/A;  . EYE SURGERY Bilateral    cataract  . FOOT SURGERY Bilateral   . HAND SURGERY Right   . MASTECTOMY Right 1999   BREAST CA  . MASTECTOMY Right 1999  . PARTIAL COLECTOMY    . THYROIDECTOMY    . TOTAL HIP ARTHROPLASTY Left 03/19/2018   Procedure: TOTAL HIP ARTHROPLASTY LEFT;  Surgeon: Hessie Knows, MD;  Location: [facility] ORS;  Service: Orthopedics;  Laterality: Left;  Marland Kitchen VIDEO ASSISTED THORACOSCOPY Left 09/10/2017   Procedure: VIDEO ASSISTED THORACOSCOPY;  Surgeon: [name], MD;  Location: [facility] ORS;  Service: Thoracic;  Laterality: Left;  with biopsies  . VIDEO BRONCHOSCOPY  09/10/2017   Procedure: VIDEO BRONCHOSCOPY;  Surgeon: [name], MD;  Location: [facility] ORS;  Service: Thoracic;;    Family Psychiatric History: see below  Family History:  Family History  Problem Relation Age of Onset  . Dementia Mother   . Dementia Sister   . Diabetes Sister   . Heart attack Brother 29  . COPD Brother   . Lung cancer Brother        hx smoking  . Colon cancer Brother   . Liver cancer Brother   . COPD Brother   . Lung cancer Brother        hx smoking  . Colon cancer Maternal Grandfather        dx >.50  . Breast cancer Neg Hx     Social History:  Social History   Socioeconomic History  . Marital status: Married    Spouse name: evert  . Number of children: 4  . Years of education: Not on file  . Highest education level: 9th grade  Occupational History  . Not on file  Tobacco Use  . Smoking status: Never Smoker  . Smokeless tobacco: Never Used  Substance and Sexual Activity  . Alcohol use: No    Alcohol/week: 0.0 standard drinks  . Drug use: No  . Sexual activity: Not Currently  Other Topics Concern  . Not on file  Social History Narrative  . Not on file   Social Determinants of Health   Financial Resource Strain:    . Difficulty of Paying Living Expenses:   Food Insecurity:   . Worried About Charity fundraiser in the Last Year:   . Arboriculturist in the Last Year:   Transportation Needs:   . Film/video editor (Medical):   Marland Kitchen Lack of Transportation (Non-Medical):   Physical Activity:   . Days of Exercise per Week:   . Minutes of Exercise per Session:   Stress:   .  Feeling of Stress :   Social Connections:   . Frequency of Communication with Friends and Family:   . Frequency of Social Gatherings with Friends and Family:   . Attends Religious Services:   . Active Member of Clubs or Organizations:   . Attends Archivist Meetings:   Marland Kitchen Marital Status:     Allergies:  Allergies  Allergen Reactions  . Imipramine Tinitus  . Latex Rash  . Penicillins Rash  . Tape Rash    Metabolic Disorder Labs: No results found for: HGBA1C, MPG No results found for: PROLACTIN No results found for: CHOL, TRIG, HDL, CHOLHDL, VLDL, LDLCALC Lab Results  Component Value Date   TSH 0.275 (L) 11/09/2012   TSH 0.443 (L) 10/12/2012    Therapeutic Level Labs: No results found for: LITHIUM No results found for: VALPROATE No components found for:  CBMZ  Current Medications: Current Outpatient Medications  Medication Sig Dispense Refill  . acetaminophen (TYLENOL) 325 MG tablet Take 650 mg by mouth every 4 (four) hours as needed. For pain / increased temp.  May be administered orally,  per G-Tube if needed or rectally if unable to swallow (separate order).  Maximum dose for 24 hours is 3,000 mg from all sources of Acetaminophen / Tylenol    . atorvastatin (LIPITOR) 40 MG tablet Take 1 tablet (40 mg total) by mouth at bedtime. 30 tablet 0  . cholecalciferol (VITAMIN D) 1000 units tablet Take 1,000 Units by mouth daily.    . cyanocobalamin (V-R VITAMIN B-12) 500 MCG tablet Take 500 mcg by mouth daily.     . DULoxetine (CYMBALTA) 60 MG capsule Take 1 capsule (60 mg total) by mouth every morning. 90  capsule 0  . estradiol (ESTRACE) 0.1 MG/GM vaginal cream Place 1 Applicatorful vaginally 3 (three) times a week. Twice a week. 42.5 g 2  . gabapentin (NEURONTIN) 100 MG capsule Take 1 capsule by mouth daily.    Marland Kitchen letrozole (FEMARA) 2.5 MG tablet TAKE 1 TABLET BY MOUTH EVERY DAY 90 tablet 1  . levothyroxine (SYNTHROID) 100 MCG tablet Take 1 tablet by mouth daily.    . mirabegron ER (MYRBETRIQ) 50 MG TB24 tablet Take 1 tablet (50 mg total) by mouth daily. 90 tablet 3  . ondansetron (ZOFRAN) 4 MG tablet TAKE 1 TABLET (4 MG TOTAL) BY MOUTH EVERY 6 (SIX) HOURS AS NEEDED FOR NAUSEA OR VOMITING. 21 tablet 1  . palbociclib (IBRANCE) 100 MG tablet Take 1 tablet (100 mg total) by mouth daily. Take for 21 days on, 7 days off, repeat every 28 days. (Patient not taking: Reported on 05/02/2019) 21 tablet 4  . pantoprazole (PROTONIX) 40 MG tablet Take 1 tablet (40 mg total) by mouth 2 (two) times daily. 60 tablet 0  . polyethylene glycol (MIRALAX / GLYCOLAX) packet Take 17 g by mouth at bedtime.    . senna (SENOKOT) 8.6 MG TABS tablet Take 2 tablets by mouth 2 (two) times daily.    . traZODone (DESYREL) 100 MG tablet Take 1 tablet (100 mg total) by mouth at bedtime. 90 tablet 0   No current facility-administered medications for this visit.      Psychiatric Specialty Exam: Review of Systems  There were no vitals taken for this visit.There is no height or weight on file to calculate BMI.  General Appearance: unable to assess due to phone visit  Eye Contact:  Unable to assess due to phone visit  Speech:  Clear and Coherent and Normal Rate  Volume:  Normal  Mood:  Euthymic  Affect:  Congruent  Thought Process:  Goal Directed, Linear and Descriptions of Associations: Intact  Orientation:  Full (Time, Place, and Person)  Thought Content: Logical   Suicidal Thoughts:  No  Homicidal Thoughts:  No  Memory:  Recent;   Good Remote;   Good  Judgement:  Good  Insight:  Good  Psychomotor Activity:  Normal   Concentration:  Concentration: Good and Attention Span: Good  Recall:  Good  Fund of Knowledge: Good  Language: Good  Akathisia:  Negative  Handed:  Right  AIMS (if indicated): not done  Assets:  Communication Skills Desire for Improvement Financial Resources/Insurance Housing  ADL's:  Intact  Cognition: WNL  Sleep:  Good    Assessment and Plan: Patient is feeling upset due to her chronic health issues.  She requested for an anxiety medicine and was offered buspirone. Potential side effects of medication and risks vs benefits of treatment vs non-treatment were explained and discussed. All questions were answered.  1. MDD (major depressive disorder), recurrent, in full remission ([place])  - DULoxetine (CYMBALTA) 60 MG capsule; Take 1 capsule (60 mg total) by mouth every morning.  Dispense: 90 capsule; Refill: 0 - traZODone (DESYREL) 100 MG tablet; Take 1 tablet (100 mg total) by mouth at bedtime.  Dispense: 90 tablet; Refill: 0 - busPIRone (BUSPAR) 10 MG tablet; Take 1 tablet (10 mg total) by mouth 2 (two) times daily.  Dispense: 180 tablet; Refill: 0  2. Post traumatic stress disorder (PTSD)  - DULoxetine (CYMBALTA) 60 MG capsule; Take 1 capsule (60 mg total) by mouth every morning.  Dispense: 90 capsule; Refill: 0  Start buspirone 10 mg twice daily. Follow up in 6 weeks.    N[place]ada Crane, MD 07/15/2019, 1:57 PM

## 2019-07-23 DIAGNOSIS — Z9011 Acquired absence of right breast and nipple: Secondary | ICD-10-CM | POA: Diagnosis not present

## 2019-07-23 DIAGNOSIS — F418 Other specified anxiety disorders: Secondary | ICD-10-CM | POA: Diagnosis not present

## 2019-07-23 DIAGNOSIS — G4730 Sleep apnea, unspecified: Secondary | ICD-10-CM | POA: Diagnosis not present

## 2019-07-23 DIAGNOSIS — Z87440 Personal history of urinary (tract) infections: Secondary | ICD-10-CM | POA: Diagnosis not present

## 2019-07-23 DIAGNOSIS — C50919 Malignant neoplasm of unspecified site of unspecified female breast: Secondary | ICD-10-CM | POA: Diagnosis not present

## 2019-07-23 DIAGNOSIS — R32 Unspecified urinary incontinence: Secondary | ICD-10-CM | POA: Diagnosis not present

## 2019-07-23 DIAGNOSIS — Z9181 History of falling: Secondary | ICD-10-CM | POA: Diagnosis not present

## 2019-07-23 DIAGNOSIS — M150 Primary generalized (osteo)arthritis: Secondary | ICD-10-CM | POA: Diagnosis not present

## 2019-07-23 DIAGNOSIS — M5116 Intervertebral disc disorders with radiculopathy, lumbar region: Secondary | ICD-10-CM | POA: Diagnosis not present

## 2019-07-23 DIAGNOSIS — Z96642 Presence of left artificial hip joint: Secondary | ICD-10-CM | POA: Diagnosis not present

## 2019-07-23 DIAGNOSIS — C7802 Secondary malignant neoplasm of left lung: Secondary | ICD-10-CM | POA: Diagnosis not present

## 2019-07-23 DIAGNOSIS — Z86718 Personal history of other venous thrombosis and embolism: Secondary | ICD-10-CM | POA: Diagnosis not present

## 2019-07-23 DIAGNOSIS — K219 Gastro-esophageal reflux disease without esophagitis: Secondary | ICD-10-CM | POA: Diagnosis not present

## 2019-07-29 DIAGNOSIS — K219 Gastro-esophageal reflux disease without esophagitis: Secondary | ICD-10-CM | POA: Diagnosis not present

## 2019-07-29 DIAGNOSIS — F418 Other specified anxiety disorders: Secondary | ICD-10-CM | POA: Diagnosis not present

## 2019-07-29 DIAGNOSIS — Z96642 Presence of left artificial hip joint: Secondary | ICD-10-CM | POA: Diagnosis not present

## 2019-07-29 DIAGNOSIS — Z87440 Personal history of urinary (tract) infections: Secondary | ICD-10-CM | POA: Diagnosis not present

## 2019-07-29 DIAGNOSIS — M5116 Intervertebral disc disorders with radiculopathy, lumbar region: Secondary | ICD-10-CM | POA: Diagnosis not present

## 2019-07-29 DIAGNOSIS — Z9181 History of falling: Secondary | ICD-10-CM | POA: Diagnosis not present

## 2019-07-29 DIAGNOSIS — C50919 Malignant neoplasm of unspecified site of unspecified female breast: Secondary | ICD-10-CM | POA: Diagnosis not present

## 2019-07-29 DIAGNOSIS — Z86718 Personal history of other venous thrombosis and embolism: Secondary | ICD-10-CM | POA: Diagnosis not present

## 2019-07-29 DIAGNOSIS — R32 Unspecified urinary incontinence: Secondary | ICD-10-CM | POA: Diagnosis not present

## 2019-07-29 DIAGNOSIS — M150 Primary generalized (osteo)arthritis: Secondary | ICD-10-CM | POA: Diagnosis not present

## 2019-07-29 DIAGNOSIS — C7802 Secondary malignant neoplasm of left lung: Secondary | ICD-10-CM | POA: Diagnosis not present

## 2019-07-29 DIAGNOSIS — G4730 Sleep apnea, unspecified: Secondary | ICD-10-CM | POA: Diagnosis not present

## 2019-07-29 DIAGNOSIS — Z9011 Acquired absence of right breast and nipple: Secondary | ICD-10-CM | POA: Diagnosis not present

## 2019-07-31 DIAGNOSIS — G4733 Obstructive sleep apnea (adult) (pediatric): Secondary | ICD-10-CM | POA: Diagnosis not present

## 2019-08-01 ENCOUNTER — Inpatient Hospital Stay (HOSPITAL_BASED_OUTPATIENT_CLINIC_OR_DEPARTMENT_OTHER): Payer: PPO | Admitting: Oncology

## 2019-08-01 ENCOUNTER — Inpatient Hospital Stay: Payer: PPO | Attending: Oncology

## 2019-08-01 ENCOUNTER — Other Ambulatory Visit: Payer: Self-pay

## 2019-08-01 ENCOUNTER — Encounter: Payer: Self-pay | Admitting: Oncology

## 2019-08-01 VITALS — BP 116/74 | HR 78 | Temp 97.6°F | Resp 18 | Wt 164.0 lb

## 2019-08-01 DIAGNOSIS — Z79811 Long term (current) use of aromatase inhibitors: Secondary | ICD-10-CM | POA: Diagnosis not present

## 2019-08-01 DIAGNOSIS — C50919 Malignant neoplasm of unspecified site of unspecified female breast: Secondary | ICD-10-CM | POA: Insufficient documentation

## 2019-08-01 DIAGNOSIS — Z170 Estrogen receptor positive status [ER+]: Secondary | ICD-10-CM | POA: Insufficient documentation

## 2019-08-01 DIAGNOSIS — C50912 Malignant neoplasm of unspecified site of left female breast: Secondary | ICD-10-CM

## 2019-08-01 DIAGNOSIS — C782 Secondary malignant neoplasm of pleura: Secondary | ICD-10-CM | POA: Diagnosis not present

## 2019-08-01 LAB — CBC WITH DIFFERENTIAL/PLATELET
Abs Immature Granulocytes: 0.02 10*3/uL (ref 0.00–0.07)
Basophils Absolute: 0.1 10*3/uL (ref 0.0–0.1)
Basophils Relative: 1 %
Eosinophils Absolute: 0.1 10*3/uL (ref 0.0–0.5)
Eosinophils Relative: 2 %
HCT: 37.8 % (ref 36.0–46.0)
Hemoglobin: 12.2 g/dL (ref 12.0–15.0)
Immature Granulocytes: 0 %
Lymphocytes Relative: 27 %
Lymphs Abs: 1.9 10*3/uL (ref 0.7–4.0)
MCH: 28.6 pg (ref 26.0–34.0)
MCHC: 32.3 g/dL (ref 30.0–36.0)
MCV: 88.7 fL (ref 80.0–100.0)
Monocytes Absolute: 0.8 10*3/uL (ref 0.1–1.0)
Monocytes Relative: 11 %
Neutro Abs: 4 10*3/uL (ref 1.7–7.7)
Neutrophils Relative %: 59 %
Platelets: 229 10*3/uL (ref 150–400)
RBC: 4.26 MIL/uL (ref 3.87–5.11)
RDW: 13.2 % (ref 11.5–15.5)
WBC: 6.9 10*3/uL (ref 4.0–10.5)
nRBC: 0 % (ref 0.0–0.2)

## 2019-08-01 LAB — COMPREHENSIVE METABOLIC PANEL
ALT: 20 U/L (ref 0–44)
AST: 23 U/L (ref 15–41)
Albumin: 3.7 g/dL (ref 3.5–5.0)
Alkaline Phosphatase: 67 U/L (ref 38–126)
Anion gap: 6 (ref 5–15)
BUN: 13 mg/dL (ref 8–23)
CO2: 29 mmol/L (ref 22–32)
Calcium: 8.4 mg/dL — ABNORMAL LOW (ref 8.9–10.3)
Chloride: 103 mmol/L (ref 98–111)
Creatinine, Ser: 0.84 mg/dL (ref 0.44–1.00)
GFR calc Af Amer: >60 mL/min (ref >60)
GFR calc non Af Amer: >60 mL/min (ref >60)
Glucose, Bld: 91 mg/dL (ref 70–99)
Potassium: 3.7 mmol/L (ref 3.5–5.1)
Sodium: 138 mmol/L (ref 135–145)
Total Bilirubin: 0.6 mg/dL (ref 0.3–1.2)
Total Protein: 7 g/dL (ref 6.5–8.1)

## 2019-08-01 NOTE — Progress Notes (Signed)
Pt in for follow up states having pain "all over, all the time".  Pt reports not able to walk without assistive device in home or out of home.  States PT is coming to home currently per Dr Caryl Comes. Pt request refill on Zofran.

## 2019-08-02 LAB — CANCER ANTIGEN 27.29: CA 27.29: 25.2 U/mL (ref 0.0–38.6)

## 2019-08-02 LAB — CANCER ANTIGEN 15-3: CA 15-3: 25.8 U/mL — ABNORMAL HIGH (ref 0.0–25.0)

## 2019-08-04 ENCOUNTER — Other Ambulatory Visit: Payer: Self-pay | Admitting: Orthopedic Surgery

## 2019-08-04 DIAGNOSIS — M7072 Other bursitis of hip, left hip: Secondary | ICD-10-CM | POA: Diagnosis not present

## 2019-08-04 DIAGNOSIS — Z96642 Presence of left artificial hip joint: Secondary | ICD-10-CM

## 2019-08-04 DIAGNOSIS — M25552 Pain in left hip: Secondary | ICD-10-CM | POA: Diagnosis not present

## 2019-08-05 DIAGNOSIS — M150 Primary generalized (osteo)arthritis: Secondary | ICD-10-CM | POA: Diagnosis not present

## 2019-08-05 DIAGNOSIS — M5116 Intervertebral disc disorders with radiculopathy, lumbar region: Secondary | ICD-10-CM | POA: Diagnosis not present

## 2019-08-05 DIAGNOSIS — G4730 Sleep apnea, unspecified: Secondary | ICD-10-CM | POA: Diagnosis not present

## 2019-08-05 DIAGNOSIS — C7802 Secondary malignant neoplasm of left lung: Secondary | ICD-10-CM | POA: Diagnosis not present

## 2019-08-05 DIAGNOSIS — K219 Gastro-esophageal reflux disease without esophagitis: Secondary | ICD-10-CM | POA: Diagnosis not present

## 2019-08-05 DIAGNOSIS — C50919 Malignant neoplasm of unspecified site of unspecified female breast: Secondary | ICD-10-CM | POA: Diagnosis not present

## 2019-08-05 DIAGNOSIS — F418 Other specified anxiety disorders: Secondary | ICD-10-CM | POA: Diagnosis not present

## 2019-08-05 NOTE — Progress Notes (Signed)
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 She underwent mastectomy followed by adjuvant chemotherapy and 5 years of tamoxifen. Patient has been having some low back pain and underwent a CT lumbar spine without contrast on 08/10/2017 which was done by the pain clinic. CT mainly showed age-related degenerative disease but also showed incidental nodular thickening of the posterior left diaphragm concerning for tumor and a CT chest was recommended.  CT chest on 08/14/2017 showed scattered left pleural nodularity with tiny left pleural effusion and prominent left internal mammary and juxtadiaphragmatic lymph nodes which are nonspecific but metastatic disease could not be ruled out.  This was followed by a PET/CT scan on 08/22/2017 which showed numerous small left-sided pleural nodules which were mildly hypermetabolic along with hypermetabolic mediastinal lymph nodes concerning for metastatic disease. Small metastatic nodule in the left upper quadrant partly in the retrocrural space. No findings of pulmonary metastatic or osseous metastatic disease.   Patient underwent FNA of retrocrural LN which was positive for carcinoma but primary could not be ascertained. She then underwent pleural biopsy which  showed metastatic carcinoma consistent with breast primary. ER+HER-2. Patient started taking Ibrance in August 2019  RBC testing showedPIKCA and BRCA mutation  Scans have shown stable to improved disease so far   Interval history-patient continues to have pain in her left leg which starts in her left groin and radiates down.  She finds it difficult at times to lift her left leg to ambulate.  She has seen orthopedics in the past as well.  She has had left total hip prosthesis.  Patient is tearful as her left hip is limiting her mobility and she is mainly confined to home on most days  ECOG PS- 1 Pain scale- 6 Opioid associated constipation- no  Review of systems- Review of Systems  Constitutional: Positive for malaise/fatigue. Negative for chills, fever and weight loss.  HENT: Negative for congestion, ear discharge and nosebleeds.   Eyes: Negative for blurred vision.  Respiratory: Negative for cough, hemoptysis, sputum production, shortness of breath and wheezing.   Cardiovascular: Negative for chest pain, palpitations, orthopnea and claudication.  Gastrointestinal: Negative for abdominal pain, blood in stool, constipation, diarrhea, heartburn, melena, nausea and vomiting.  Genitourinary: Negative for dysuria, flank pain, frequency, hematuria and urgency.  Musculoskeletal: Negative for back pain, joint pain and myalgias.       Left hip pain  Skin: Negative for rash.  Neurological: Negative for dizziness, tingling, focal weakness, seizures, weakness and headaches.  Endo/Heme/Allergies: Does not bruise/bleed easily.  Psychiatric/Behavioral: Negative for depression and suicidal ideas. The patient does not have insomnia.       Allergies  Allergen Reactions  . Imipramine Tinitus  . Latex Rash  . Penicillins Rash  . Tape Rash     Past Medical History:  Diagnosis Date  . Anxiety   . Atrial  fibrillation ([place])   . Breast cancer, right ([place]) 1999   RT MASTECTOMY and chemo tx's.    . DDD (degenerative disc disease), cervical    hands and back with arthritis  . Depression   . Diverticulosis   . Diverticulosis   . DVT of lower extremity (deep venous thrombosis) ([place])   . Dyspnea   . Family history of colon cancer   . Family history of lung cancer   . Family history of throat cancer   . GERD (gastroesophageal reflux disease)   . Goiter   . Headache   . History of chemotherapy 2000   BREAST CA  . Hypertension   . Incontinence   . Neuromuscular disorder ([place])    peipheral neuropathy  . OSA (obstructive sleep apnea)   . Osteoporosis   . Personal history of chemotherapy 1999   BREAST CA  . Status post chemotherapy    2000 right breast cancer  . Thyroid disease   . UTI (urinary tract infection)   . Vertigo      Past Surgical History:  Procedure Laterality Date  . ABDOMINAL HYSTERECTOMY    . BREAST BIOPSY Left 2006   negative  . BUNIONECTOMY Bilateral    Screws implanted.  . CHOLECYSTECTOMY    . COCHLEAR IMPLANT Right   . COLONOSCOPY WITH PROPOFOL N/A 07/24/2016   Procedure: COLONOSCOPY WITH PROPOFOL;  Surgeon: Manya Silvas, MD;  [facility] ENDOSCOPY;  Service: Endoscopy;  Laterality: N/A;  . ESOPHAGOGASTRODUODENOSCOPY (EGD) WITH PROPOFOL N/A 04/12/2016   Procedure: ESOPHAGOGASTRODUODENOSCOPY (EGD) WITH PROPOFOL;  Surgeon: Manya Silvas, MD;  Location[facility] ENDOSCOPY;  Service: Endoscopy;  Laterality: N/A;  . EYE SURGERY Bilateral    cataract  . FOOT SURGERY Bilateral   . HAND SURGERY Right   . MASTECTOMY Right 1999   BREAST CA  . MASTECTOMY Right 1999  . PARTIAL COLECTOMY    . THYROIDECTOMY    . TOTAL HIP ARTHROPLASTY Left 03/19/2018   Procedure: TOTAL HIP ARTHROPLASTY LEFT;  Surgeon: Hessie Knows, MD;  Location: [facility] ORS;  Service: Orthopedics;  Laterality: Left;  Marland Kitchen VIDEO ASSISTED THORACOSCOPY Left 09/10/2017   Procedure: VIDEO ASSISTED THORACOSCOPY;  Surgeon: [name], MD;  Location: [facility] ORS;  Service: Thoracic;  Laterality:  Left;  with biopsies  . VIDEO BRONCHOSCOPY  09/10/2017   Procedure: VIDEO BRONCHOSCOPY;  Surgeon: [name], MD;  Location: [facility] ORS;  Service: Thoracic;;    Social History   Socioeconomic History  . Marital status: Married    Spouse name: evert  . Number of children: 4  . Years of education: Not on file  . Highest education level: 9th grade  Occupational History  . Not on file  Tobacco Use  . Smoking status: Never Smoker  . Smokeless tobacco: Never Used  Substance and Sexual Activity  . Alcohol use: No    Alcohol/week: 0.0 standard drinks  . Drug use: No  . Sexual activity: Not Currently  Other Topics Concern  . Not on file  Social History Narrative  . Not on file   Social Determinants of Health   Financial Resource Strain:   . Difficulty of Paying Living Expenses:   Food Insecurity:   . Worried About Charity fundraiser in the Last Year:   . Arboriculturist in the Last Year:   Transportation Needs:   . Film/video editor (Medical):   Marland Kitchen Lack of Transportation (Non-Medical):   Physical Activity:   . Days of Exercise per  Week:   . Minutes of Exercise per Session:   Stress:   . Feeling of Stress :   Social Connections:   . Frequency of Communication with Friends and Family:   . Frequency of Social Gatherings with Friends and Family:   . Attends Religious Services:   . Active Member of Clubs or Organizations:   . Attends Archivist Meetings:   Marland Kitchen Marital Status:   Intimate Partner Violence:   . Fear of Current or Ex-Partner:   . Emotionally Abused:   Marland Kitchen Physically Abused:   . Sexually Abused:     Family History  Problem Relation Age of Onset  . Dementia Mother   . Dementia Sister   . Diabetes Sister   . Heart attack Brother 15  . COPD Brother   . Lung cancer Brother        hx smoking  . Colon cancer Brother   . Liver cancer Brother   . COPD Brother   . Lung cancer Brother        hx smoking  . Colon cancer Maternal Grandfather         dx >.50  . Breast cancer Neg Hx      Current Outpatient Medications:  .  acetaminophen (TYLENOL) 325 MG tablet, Take 650 mg by mouth every 4 (four) hours as needed. For pain / increased temp.  May be administered orally,  per G-Tube if needed or rectally if unable to swallow (separate order).  Maximum dose for 24 hours is 3,000 mg from all sources of Acetaminophen / Tylenol, Disp: , Rfl:  .  atorvastatin (LIPITOR) 40 MG tablet, Take 1 tablet (40 mg total) by mouth at bedtime., Disp: 30 tablet, Rfl: 0 .  busPIRone (BUSPAR) 10 MG tablet, Take 1 tablet (10 mg total) by mouth 2 (two) times daily., Disp: 180 tablet, Rfl: 0 .  cholecalciferol (VITAMIN D) 1000 units tablet, Take 1,000 Units by mouth daily., Disp: , Rfl:  .  cyanocobalamin (V-R VITAMIN B-12) 500 MCG tablet, Take 500 mcg by mouth daily. , Disp: , Rfl:  .  DULoxetine (CYMBALTA) 60 MG capsule, Take 1 capsule (60 mg total) by mouth every morning., Disp: 90 capsule, Rfl: 0 .  gabapentin (NEURONTIN) 100 MG capsule, Take 1 capsule by mouth daily., Disp: , Rfl:  .  letrozole (FEMARA) 2.5 MG tablet, TAKE 1 TABLET BY MOUTH EVERY DAY, Disp: 90 tablet, Rfl: 1 .  levothyroxine (SYNTHROID) 100 MCG tablet, Take 1 tablet by mouth daily., Disp: , Rfl:  .  mirabegron ER (MYRBETRIQ) 50 MG TB24 tablet, Take 1 tablet (50 mg total) by mouth daily., Disp: 90 tablet, Rfl: 3 .  pantoprazole (PROTONIX) 40 MG tablet, Take 1 tablet (40 mg total) by mouth 2 (two) times daily., Disp: 60 tablet, Rfl: 0 .  polyethylene glycol (MIRALAX / GLYCOLAX) packet, Take 17 g by mouth at bedtime., Disp: , Rfl:  .  senna (SENOKOT) 8.6 MG TABS tablet, Take 2 tablets by mouth 2 (two) times daily., Disp: , Rfl:  .  traZODone (DESYREL) 100 MG tablet, Take 1 tablet (100 mg total) by mouth at bedtime., Disp: 90 tablet, Rfl: 0 .  estradiol (ESTRACE) 0.1 MG/GM vaginal cream, Place 1 Applicatorful vaginally 3 (three) times a week. Twice a week. (Patient not taking: Reported on 08/01/2019),  Disp: 42.5 g, Rfl: 2 .  ondansetron (ZOFRAN) 4 MG tablet, TAKE 1 TABLET (4 MG TOTAL) BY MOUTH EVERY 6 (SIX) HOURS AS NEEDED FOR NAUSEA OR VOMITING. (  Patient not taking: Reported on 08/01/2019), Disp: 21 tablet, Rfl: 1 .  palbociclib (IBRANCE) 100 MG tablet, Take 1 tablet (100 mg total) by mouth daily. Take for 21 days on, 7 days off, repeat every 28 days. (Patient not taking: Reported on 05/02/2019), Disp: 21 tablet, Rfl: 4  Physical exam:  Vitals:   08/01/19 1129  BP: 116/74  Pulse: 78  Resp: 18  Temp: 97.6 F (36.4 C)  TempSrc: Tympanic  SpO2: 100%  Weight: 164 lb (74.4 kg)   Physical Exam Constitutional:      General: She is not in acute distress. Cardiovascular:     Rate and Rhythm: Normal rate and regular rhythm.     Heart sounds: Normal heart sounds.  Pulmonary:     Effort: Pulmonary effort is normal.     Breath sounds: Normal breath sounds.  Abdominal:     General: Bowel sounds are normal.     Palpations: Abdomen is soft.     Comments: No palpable swelling in the left groin  Musculoskeletal:        General: No swelling or deformity.  Skin:    General: Skin is warm and dry.  Neurological:     Mental Status: She is alert and oriented to person, place, and time.      CMP Latest Ref Rng & Units 08/01/2019  Glucose 70 - 99 mg/dL 91  BUN 8 - 23 mg/dL 13  Creatinine 0.44 - 1.00 mg/dL 0.84  Sodium 135 - 145 mmol/L 138  Potassium 3.5 - 5.1 mmol/L 3.7  Chloride 98 - 111 mmol/L 103  CO2 22 - 32 mmol/L 29  Calcium 8.9 - 10.3 mg/dL 8.4(L)  Total Protein 6.5 - 8.1 g/dL 7.0  Total Bilirubin 0.3 - 1.2 mg/dL 0.6  Alkaline Phos 38 - 126 U/L 67  AST 15 - 41 U/L 23  ALT 0 - 44 U/L 20   CBC Latest Ref Rng & Units 08/01/2019  WBC 4.0 - 10.5 K/uL 6.9  Hemoglobin 12.0 - 15.0 g/dL 12.2  Hematocrit 36.0 - 46.0 % 37.8  Platelets 150 - 400 K/uL 229      Assessment and plan- Patient is a 79 y.o. female with stage IV breast cancer ER positive with low volume metastatic pleural  nodules currently on letrozole.  She is here for routine follow-up  Patient has had longstanding pain even prior to starting it resolved.  Although letrozole can cause arthritis it is unclear if it is exacerbating her present problem.  She has stage IV breast cancer which is currently well controlled with treatment and therefore I am not holding off on the medication at this time.  She was previously taking Ibrance which I stopped given stable disease on letrozole and Ibrance.  Plan is to reinstitute Ibrance at progression.  I will plan for repeat CT chest abdomen pelvis with contrast CBC with differential and CMP in 2 months time and see her thereafter.  We will also get a bone scan.  She will need a repeat bone density scan at some point this year   Visit Diagnosis 1. Metastatic breast cancer [facility])      Dr. Randa Evens, MD, MPH [facility] at [facility] [phone] 08/05/2019 12:25 PM

## 2019-08-06 DIAGNOSIS — K219 Gastro-esophageal reflux disease without esophagitis: Secondary | ICD-10-CM | POA: Diagnosis not present

## 2019-08-06 DIAGNOSIS — Z9181 History of falling: Secondary | ICD-10-CM | POA: Diagnosis not present

## 2019-08-06 DIAGNOSIS — Z96642 Presence of left artificial hip joint: Secondary | ICD-10-CM | POA: Diagnosis not present

## 2019-08-06 DIAGNOSIS — M5116 Intervertebral disc disorders with radiculopathy, lumbar region: Secondary | ICD-10-CM | POA: Diagnosis not present

## 2019-08-06 DIAGNOSIS — M150 Primary generalized (osteo)arthritis: Secondary | ICD-10-CM | POA: Diagnosis not present

## 2019-08-06 DIAGNOSIS — Z87440 Personal history of urinary (tract) infections: Secondary | ICD-10-CM | POA: Diagnosis not present

## 2019-08-06 DIAGNOSIS — R32 Unspecified urinary incontinence: Secondary | ICD-10-CM | POA: Diagnosis not present

## 2019-08-06 DIAGNOSIS — F418 Other specified anxiety disorders: Secondary | ICD-10-CM | POA: Diagnosis not present

## 2019-08-06 DIAGNOSIS — Z9011 Acquired absence of right breast and nipple: Secondary | ICD-10-CM | POA: Diagnosis not present

## 2019-08-06 DIAGNOSIS — Z86718 Personal history of other venous thrombosis and embolism: Secondary | ICD-10-CM | POA: Diagnosis not present

## 2019-08-06 DIAGNOSIS — G4730 Sleep apnea, unspecified: Secondary | ICD-10-CM | POA: Diagnosis not present

## 2019-08-06 DIAGNOSIS — C50919 Malignant neoplasm of unspecified site of unspecified female breast: Secondary | ICD-10-CM | POA: Diagnosis not present

## 2019-08-06 DIAGNOSIS — C7802 Secondary malignant neoplasm of left lung: Secondary | ICD-10-CM | POA: Diagnosis not present

## 2019-08-08 DIAGNOSIS — H353211 Exudative age-related macular degeneration, right eye, with active choroidal neovascularization: Secondary | ICD-10-CM | POA: Diagnosis not present

## 2019-08-13 ENCOUNTER — Encounter
Admission: RE | Admit: 2019-08-13 | Discharge: 2019-08-13 | Disposition: A | Discharge status: Home / Self Care | Payer: PPO | Source: Ambulatory Visit | Attending: Orthopedic Surgery | Admitting: Orthopedic Surgery

## 2019-08-13 ENCOUNTER — Other Ambulatory Visit: Payer: Self-pay

## 2019-08-13 DIAGNOSIS — Z96642 Presence of left artificial hip joint: Secondary | ICD-10-CM | POA: Diagnosis not present

## 2019-08-13 DIAGNOSIS — R1030 Lower abdominal pain, unspecified: Secondary | ICD-10-CM | POA: Diagnosis not present

## 2019-08-13 DIAGNOSIS — M25552 Pain in left hip: Secondary | ICD-10-CM | POA: Diagnosis not present

## 2019-08-13 MED ORDER — TECHNETIUM TC 99M MEDRONATE IV KIT
20.0000 | PACK | Freq: Once | INTRAVENOUS | Status: AC | PRN
  Administered 2019-08-13: 19.83 via INTRAVENOUS

## 2019-08-22 DIAGNOSIS — C50919 Malignant neoplasm of unspecified site of unspecified female breast: Secondary | ICD-10-CM | POA: Diagnosis not present

## 2019-08-22 DIAGNOSIS — Z9181 History of falling: Secondary | ICD-10-CM | POA: Diagnosis not present

## 2019-08-22 DIAGNOSIS — Z86718 Personal history of other venous thrombosis and embolism: Secondary | ICD-10-CM | POA: Diagnosis not present

## 2019-08-22 DIAGNOSIS — Z96642 Presence of left artificial hip joint: Secondary | ICD-10-CM | POA: Diagnosis not present

## 2019-08-22 DIAGNOSIS — M5116 Intervertebral disc disorders with radiculopathy, lumbar region: Secondary | ICD-10-CM | POA: Diagnosis not present

## 2019-08-22 DIAGNOSIS — Z9011 Acquired absence of right breast and nipple: Secondary | ICD-10-CM | POA: Diagnosis not present

## 2019-08-22 DIAGNOSIS — K219 Gastro-esophageal reflux disease without esophagitis: Secondary | ICD-10-CM | POA: Diagnosis not present

## 2019-08-22 DIAGNOSIS — Z87440 Personal history of urinary (tract) infections: Secondary | ICD-10-CM | POA: Diagnosis not present

## 2019-08-22 DIAGNOSIS — G4730 Sleep apnea, unspecified: Secondary | ICD-10-CM | POA: Diagnosis not present

## 2019-08-22 DIAGNOSIS — C7802 Secondary malignant neoplasm of left lung: Secondary | ICD-10-CM | POA: Diagnosis not present

## 2019-08-22 DIAGNOSIS — R32 Unspecified urinary incontinence: Secondary | ICD-10-CM | POA: Diagnosis not present

## 2019-08-22 DIAGNOSIS — F418 Other specified anxiety disorders: Secondary | ICD-10-CM | POA: Diagnosis not present

## 2019-08-22 DIAGNOSIS — M150 Primary generalized (osteo)arthritis: Secondary | ICD-10-CM | POA: Diagnosis not present

## 2019-08-26 ENCOUNTER — Telehealth (INDEPENDENT_AMBULATORY_CARE_PROVIDER_SITE_OTHER): Payer: PPO | Admitting: Psychiatry

## 2019-08-26 ENCOUNTER — Other Ambulatory Visit: Payer: Self-pay

## 2019-08-26 ENCOUNTER — Encounter (HOSPITAL_COMMUNITY): Payer: Self-pay | Admitting: Psychiatry

## 2019-08-26 ENCOUNTER — Telehealth: Payer: PPO | Admitting: Psychiatry

## 2019-08-26 DIAGNOSIS — F4310 Post-traumatic stress disorder, unspecified: Secondary | ICD-10-CM | POA: Diagnosis not present

## 2019-08-26 DIAGNOSIS — F3342 Major depressive disorder, recurrent, in full remission: Secondary | ICD-10-CM

## 2019-08-26 MED ORDER — TRAZODONE HCL 100 MG PO TABS: 100.0000 mg | ORAL_TABLET | Freq: Every day | ORAL | 0 refills | Status: DC

## 2019-08-26 MED ORDER — DULOXETINE HCL 60 MG PO CPEP: 60.0000 mg | ORAL_CAPSULE | Freq: Every morning | ORAL | 0 refills | Status: DC

## 2019-08-26 MED ORDER — BUSPIRONE HCL 10 MG PO TABS: 10.0000 mg | ORAL_TABLET | Freq: Two times a day (BID) | ORAL | 0 refills | Status: DC

## 2019-08-26 NOTE — Progress Notes (Addendum)
[place] MD OP Progress Note  Virtual Visit via Telephone Note  I connected with Breanna Zhang on 08/30/19 at 11:30 AM EDT by telephone and verified that I am speaking with the correct person using two identifiers.  Location: Patient: home, daughter: her home Provider: Clinic   I discussed the limitations, risks, security and privacy concerns of performing an evaluation and management service by telephone and the availability of in person appointments. I also discussed with the patient that there may be a patient responsible charge related to this service. The patient expressed understanding and agreed to proceed.   Of note, pt is hard-of-hearing.  I provided 17 minutes of non-face-to-face time during this encounter.   08/26/2019 11:24 AM Breanna Zhang  MRN:  [id number]  Chief Complaint: As per daughter, " She is doing much better." As per patient, " I am feeling a whole lot better."  HPI: Writer spoke with patient's daughter first.  She informed that she is not with the patient and therefore could not provide access to her camera phone.  She informed that patient has been doing better than before.  She informed that last week she was seen by her orthopedician Dr. Rudene Christians and he informed her that based on her evaluation there were no issues related to her hip replacement and that she will benefit by being seen by PMR for her pain management.  She has been referred to PMR specialist and has an appointment scheduled in early July.  Daughter informed that this provided great relief to her mother and she seems to be doing better now. Writer spoke with the patient with the daughter on conference line helping to repeat the questions asked as patient is quite hard of hearing.  Patient stated that she found buspirone to be helpful in controlling her anxiety.  She stated that she is doing better now and is managing her pain a little better than before.  She still has pain which sometimes keeps her up at  night.  She stated that overall she is in much better shape compared to her last conversation with the writer and appreciates the medication she was prescribed.  Patient and her daughter would like to keep this regimen the same way for now.     Visit Diagnosis:    ICD-10-CM   1. MDD (major depressive disorder), recurrent, in full remission ([place])  F33.42   2. Post traumatic stress disorder (PTSD)  F43.10     Past Psychiatric History: depression, PTSD  Past Medical History:  Past Medical History:  Diagnosis Date  . Anxiety   . Atrial fibrillation ([place])   . Breast cancer, right ([place]) 1999   RT MASTECTOMY and chemo tx's.   . DDD (degenerative disc disease), cervical    hands and back with arthritis  . Depression   . Diverticulosis   . Diverticulosis   . DVT of lower extremity (deep venous thrombosis) ([place])   . Dyspnea   . Family history of colon cancer   . Family history of lung cancer   . Family history of throat cancer   . GERD (gastroesophageal reflux disease)   . Goiter   . Headache   . History of chemotherapy 2000   BREAST CA  . Hypertension   . Incontinence   . Neuromuscular disorder ([place])    peipheral neuropathy  . OSA (obstructive sleep apnea)   . Osteoporosis   . Personal history of chemotherapy 1999   BREAST CA  . Status post  chemotherapy    2000 right breast cancer  . Thyroid disease   . UTI (urinary tract infection)   . Vertigo     Past Surgical History:  Procedure Laterality Date  . ABDOMINAL HYSTERECTOMY    . BREAST BIOPSY Left 2006   negative  . BUNIONECTOMY Bilateral    Screws implanted.  . CHOLECYSTECTOMY    . COCHLEAR IMPLANT Right   . COLONOSCOPY WITH PROPOFOL N/A 07/24/2016   Procedure: COLONOSCOPY WITH PROPOFOL;  Surgeon: Manya Silvas, MD;  Location: [facility] ENDOSCOPY;  Service: Endoscopy;  Laterality: N/A;  . ESOPHAGOGASTRODUODENOSCOPY (EGD) WITH PROPOFOL N/A 04/12/2016   Procedure: ESOPHAGOGASTRODUODENOSCOPY (EGD) WITH PROPOFOL;   Surgeon: Manya Silvas, MD;  Location: [facility] ENDOSCOPY;  Service: Endoscopy;  Laterality: N/A;  . EYE SURGERY Bilateral    cataract  . FOOT SURGERY Bilateral   . HAND SURGERY Right   . MASTECTOMY Right 1999   BREAST CA  . MASTECTOMY Right 1999  . PARTIAL COLECTOMY    . THYROIDECTOMY    . TOTAL HIP ARTHROPLASTY Left 03/19/2018   Procedure: TOTAL HIP ARTHROPLASTY LEFT;  Surgeon: Hessie Knows, MD;  Location: [facility] ORS;  Service: Orthopedics;  Laterality: Left;  Marland Kitchen VIDEO ASSISTED THORACOSCOPY Left 09/10/2017   Procedure: VIDEO ASSISTED THORACOSCOPY;  Surgeon: [name], MD;  Location: [facility] ORS;  Service: Thoracic;  Laterality: Left;  with biopsies  . VIDEO BRONCHOSCOPY  09/10/2017   Procedure: VIDEO BRONCHOSCOPY;  Surgeon: [name], MD;  Location: [facility] ORS;  Service: Thoracic;;    Family Psychiatric History: see below  Family History:  Family History  Problem Relation Age of Onset  . Dementia Mother   . Dementia Sister   . Diabetes Sister   . Heart attack Brother 32  . COPD Brother   . Lung cancer Brother        hx smoking  . Colon cancer Brother   . Liver cancer Brother   . COPD Brother   . Lung cancer Brother        hx smoking  . Colon cancer Maternal Grandfather        dx >.50  . Breast cancer Neg Hx     Social History:  Social History   Socioeconomic History  . Marital status: Married    Spouse name: evert  . Number of children: 4  . Years of education: Not on file  . Highest education level: 9th grade  Occupational History  . Not on file  Tobacco Use  . Smoking status: Never Smoker  . Smokeless tobacco: Never Used  Vaping Use  . Vaping Use: Never used  Substance and Sexual Activity  . Alcohol use: No    Alcohol/week: 0.0 standard drinks  . Drug use: No  . Sexual activity: Not Currently  Other Topics Concern  . Not on file  Social History Narrative  . Not on file   Social Determinants of Health   Financial Resource Strain:   . Difficulty of  Paying Living Expenses:   Food Insecurity:   . Worried About Charity fundraiser in the Last Year:   . Arboriculturist in the Last Year:   Transportation Needs:   . Film/video editor (Medical):   Marland Kitchen Lack of Transportation (Non-Medical):   Physical Activity:   . Days of Exercise per Week:   . Minutes of Exercise per Session:   Stress:   . Feeling of Stress :   Social Connections:   . Frequency of Communication  with Friends and Family:   . Frequency of Social Gatherings with Friends and Family:   . Attends Religious Services:   . Active Member of Clubs or Organizations:   . Attends Archivist Meetings:   Marland Kitchen Marital Status:     Allergies:  Allergies  Allergen Reactions  . Imipramine Tinitus  . Latex Rash  . Penicillins Rash  . Tape Rash    Metabolic Disorder Labs: No results found for: HGBA1C, MPG No results found for: PROLACTIN No results found for: CHOL, TRIG, HDL, CHOLHDL, VLDL, LDLCALC Lab Results  Component Value Date   TSH 0.275 (L) 11/09/2012   TSH 0.443 (L) 10/12/2012    Therapeutic Level Labs: No results found for: LITHIUM No results found for: VALPROATE No components found for:  CBMZ  Current Medications: Current Outpatient Medications  Medication Sig Dispense Refill  . acetaminophen (TYLENOL) 325 MG tablet Take 650 mg by mouth every 4 (four) hours as needed. For pain / increased temp.  May be administered orally,  per G-Tube if needed or rectally if unable to swallow (separate order).  Maximum dose for 24 hours is 3,000 mg from all sources of Acetaminophen / Tylenol    . atorvastatin (LIPITOR) 40 MG tablet Take 1 tablet (40 mg total) by mouth at bedtime. 30 tablet 0  . busPIRone (BUSPAR) 10 MG tablet Take 1 tablet (10 mg total) by mouth 2 (two) times daily. 180 tablet 0  . cholecalciferol (VITAMIN D) 1000 units tablet Take 1,000 Units by mouth daily.    . cyanocobalamin (V-R VITAMIN B-12) 500 MCG tablet Take 500 mcg by mouth daily.     .  DULoxetine (CYMBALTA) 60 MG capsule Take 1 capsule (60 mg total) by mouth every morning. 90 capsule 0  . estradiol (ESTRACE) 0.1 MG/GM vaginal cream Place 1 Applicatorful vaginally 3 (three) times a week. Twice a week. (Patient not taking: Reported on 08/01/2019) 42.5 g 2  . gabapentin (NEURONTIN) 100 MG capsule Take 1 capsule by mouth daily.    Marland Kitchen letrozole (FEMARA) 2.5 MG tablet TAKE 1 TABLET BY MOUTH EVERY DAY 90 tablet 1  . levothyroxine (SYNTHROID) 100 MCG tablet Take 1 tablet by mouth daily.    . mirabegron ER (MYRBETRIQ) 50 MG TB24 tablet Take 1 tablet (50 mg total) by mouth daily. 90 tablet 3  . ondansetron (ZOFRAN) 4 MG tablet TAKE 1 TABLET (4 MG TOTAL) BY MOUTH EVERY 6 (SIX) HOURS AS NEEDED FOR NAUSEA OR VOMITING. (Patient not taking: Reported on 08/01/2019) 21 tablet 1  . palbociclib (IBRANCE) 100 MG tablet Take 1 tablet (100 mg total) by mouth daily. Take for 21 days on, 7 days off, repeat every 28 days. (Patient not taking: Reported on 05/02/2019) 21 tablet 4  . pantoprazole (PROTONIX) 40 MG tablet Take 1 tablet (40 mg total) by mouth 2 (two) times daily. 60 tablet 0  . polyethylene glycol (MIRALAX / GLYCOLAX) packet Take 17 g by mouth at bedtime.    . senna (SENOKOT) 8.6 MG TABS tablet Take 2 tablets by mouth 2 (two) times daily.    . traZODone (DESYREL) 100 MG tablet Take 1 tablet (100 mg total) by mouth at bedtime. 90 tablet 0   No current facility-administered medications for this visit.      Psychiatric Specialty Exam: Review of Systems  There were no vitals taken for this visit.There is no height or weight on file to calculate BMI.  General Appearance: unable to assess due to phone visit  Eye  Contact:  Unable to assess due to phone visit  Speech:  Clear and Coherent and Normal Rate  Volume:  Normal  Mood:  Euthymic  Affect:  Congruent  Thought Process:  Goal Directed, Linear and Descriptions of Associations: Intact  Orientation:  Full (Time, Place, and Person)  Thought  Content: Logical   Suicidal Thoughts:  No  Homicidal Thoughts:  No  Memory:  Recent;   Good Remote;   Good  Judgement:  Good  Insight:  Good  Psychomotor Activity:  Normal  Concentration:  Concentration: Good and Attention Span: Good  Recall:  Good  Fund of Knowledge: Good  Language: Good  Akathisia:  Negative  Handed:  Right  AIMS (if indicated): not done  Assets:  Communication Skills Desire for Improvement Financial Resources/Insurance Housing  ADL's:  Intact  Cognition: WNL  Sleep:  Good    Assessment and Plan: Patient appears to be doing better in terms of her mood.  She found buspirone to be helpful in controlling her anxiety.  We will continue the same regimen for now.  1. MDD (major depressive disorder), recurrent, in full remission ([place])  - DULoxetine (CYMBALTA) 60 MG capsule; Take 1 capsule (60 mg total) by mouth every morning.  Dispense: 90 capsule; Refill: 0 - traZODone (DESYREL) 100 MG tablet; Take 1 tablet (100 mg total) by mouth at bedtime.  Dispense: 90 tablet; Refill: 0 - busPIRone (BUSPAR) 10 MG tablet; Take 1 tablet (10 mg total) by mouth 2 (two) times daily.  Dispense: 180 tablet; Refill: 0  2. Post traumatic stress disorder (PTSD)  - DULoxetine (CYMBALTA) 60 MG capsule; Take 1 capsule (60 mg total) by mouth every morning.  Dispense: 90 capsule; Refill: 0  Continue same regimen. Follow up in 8 weeks.    [place] Crane, MD 08/26/2019, 11:24 AM

## 2019-08-27 DIAGNOSIS — F418 Other specified anxiety disorders: Secondary | ICD-10-CM | POA: Diagnosis not present

## 2019-08-27 DIAGNOSIS — M5116 Intervertebral disc disorders with radiculopathy, lumbar region: Secondary | ICD-10-CM | POA: Diagnosis not present

## 2019-08-27 DIAGNOSIS — C50919 Malignant neoplasm of unspecified site of unspecified female breast: Secondary | ICD-10-CM | POA: Diagnosis not present

## 2019-08-27 DIAGNOSIS — Z9181 History of falling: Secondary | ICD-10-CM | POA: Diagnosis not present

## 2019-08-27 DIAGNOSIS — Z87440 Personal history of urinary (tract) infections: Secondary | ICD-10-CM | POA: Diagnosis not present

## 2019-08-27 DIAGNOSIS — Z86718 Personal history of other venous thrombosis and embolism: Secondary | ICD-10-CM | POA: Diagnosis not present

## 2019-08-27 DIAGNOSIS — M150 Primary generalized (osteo)arthritis: Secondary | ICD-10-CM | POA: Diagnosis not present

## 2019-08-27 DIAGNOSIS — K219 Gastro-esophageal reflux disease without esophagitis: Secondary | ICD-10-CM | POA: Diagnosis not present

## 2019-08-27 DIAGNOSIS — Z96642 Presence of left artificial hip joint: Secondary | ICD-10-CM | POA: Diagnosis not present

## 2019-08-27 DIAGNOSIS — G4730 Sleep apnea, unspecified: Secondary | ICD-10-CM | POA: Diagnosis not present

## 2019-08-27 DIAGNOSIS — C7802 Secondary malignant neoplasm of left lung: Secondary | ICD-10-CM | POA: Diagnosis not present

## 2019-08-27 DIAGNOSIS — Z9011 Acquired absence of right breast and nipple: Secondary | ICD-10-CM | POA: Diagnosis not present

## 2019-08-27 DIAGNOSIS — R32 Unspecified urinary incontinence: Secondary | ICD-10-CM | POA: Diagnosis not present

## 2019-08-28 ENCOUNTER — Ambulatory Visit: Payer: PPO | Admitting: Urology

## 2019-08-31 DIAGNOSIS — G4733 Obstructive sleep apnea (adult) (pediatric): Secondary | ICD-10-CM | POA: Diagnosis not present

## 2019-09-02 ENCOUNTER — Ambulatory Visit: Payer: PPO | Admitting: Urology

## 2019-09-10 ENCOUNTER — Other Ambulatory Visit: Payer: Self-pay | Admitting: Oncology

## 2019-09-11 ENCOUNTER — Other Ambulatory Visit: Payer: Self-pay | Admitting: *Deleted

## 2019-09-11 DIAGNOSIS — M5416 Radiculopathy, lumbar region: Secondary | ICD-10-CM | POA: Diagnosis not present

## 2019-09-11 DIAGNOSIS — M5136 Other intervertebral disc degeneration, lumbar region: Secondary | ICD-10-CM | POA: Diagnosis not present

## 2019-09-12 DIAGNOSIS — M5416 Radiculopathy, lumbar region: Secondary | ICD-10-CM | POA: Diagnosis not present

## 2019-09-12 DIAGNOSIS — M48062 Spinal stenosis, lumbar region with neurogenic claudication: Secondary | ICD-10-CM | POA: Diagnosis not present

## 2019-09-12 DIAGNOSIS — M5136 Other intervertebral disc degeneration, lumbar region: Secondary | ICD-10-CM | POA: Diagnosis not present

## 2019-09-12 MED ORDER — ONDANSETRON HCL 4 MG PO TABS: 4.0000 mg | ORAL_TABLET | Freq: Four times a day (QID) | ORAL | 1 refills | Status: DC | PRN

## 2019-09-24 ENCOUNTER — Ambulatory Visit: Payer: PPO | Admitting: Urology

## 2019-09-29 ENCOUNTER — Other Ambulatory Visit: Payer: Self-pay

## 2019-09-29 ENCOUNTER — Ambulatory Visit
Admission: RE | Admit: 2019-09-29 | Discharge: 2019-09-29 | Disposition: A | Discharge status: Home / Self Care | Payer: PPO | Source: Ambulatory Visit | Attending: Oncology | Admitting: Oncology

## 2019-09-29 ENCOUNTER — Inpatient Hospital Stay: Payer: PPO | Attending: Oncology

## 2019-09-29 DIAGNOSIS — K5900 Constipation, unspecified: Secondary | ICD-10-CM | POA: Diagnosis not present

## 2019-09-29 DIAGNOSIS — C50919 Malignant neoplasm of unspecified site of unspecified female breast: Secondary | ICD-10-CM | POA: Diagnosis not present

## 2019-09-29 DIAGNOSIS — C782 Secondary malignant neoplasm of pleura: Secondary | ICD-10-CM | POA: Diagnosis not present

## 2019-09-29 DIAGNOSIS — C50911 Malignant neoplasm of unspecified site of right female breast: Secondary | ICD-10-CM | POA: Diagnosis not present

## 2019-09-29 DIAGNOSIS — R918 Other nonspecific abnormal finding of lung field: Secondary | ICD-10-CM | POA: Diagnosis not present

## 2019-09-29 DIAGNOSIS — Z9011 Acquired absence of right breast and nipple: Secondary | ICD-10-CM | POA: Diagnosis not present

## 2019-09-29 DIAGNOSIS — Z79811 Long term (current) use of aromatase inhibitors: Secondary | ICD-10-CM | POA: Insufficient documentation

## 2019-09-29 DIAGNOSIS — I700 Atherosclerosis of aorta: Secondary | ICD-10-CM | POA: Diagnosis not present

## 2019-09-29 DIAGNOSIS — Z170 Estrogen receptor positive status [ER+]: Secondary | ICD-10-CM | POA: Diagnosis not present

## 2019-09-29 DIAGNOSIS — K5730 Diverticulosis of large intestine without perforation or abscess without bleeding: Secondary | ICD-10-CM | POA: Diagnosis not present

## 2019-09-29 DIAGNOSIS — K449 Diaphragmatic hernia without obstruction or gangrene: Secondary | ICD-10-CM | POA: Diagnosis not present

## 2019-09-29 LAB — CBC WITH DIFFERENTIAL/PLATELET
Abs Immature Granulocytes: 0.01 10*3/uL (ref 0.00–0.07)
Basophils Absolute: 0.1 10*3/uL (ref 0.0–0.1)
Basophils Relative: 1 %
Eosinophils Absolute: 0.1 10*3/uL (ref 0.0–0.5)
Eosinophils Relative: 1 %
HCT: 38.2 % (ref 36.0–46.0)
Hemoglobin: 12.4 g/dL (ref 12.0–15.0)
Immature Granulocytes: 0 %
Lymphocytes Relative: 27 %
Lymphs Abs: 1.8 10*3/uL (ref 0.7–4.0)
MCH: 28.4 pg (ref 26.0–34.0)
MCHC: 32.5 g/dL (ref 30.0–36.0)
MCV: 87.6 fL (ref 80.0–100.0)
Monocytes Absolute: 0.7 10*3/uL (ref 0.1–1.0)
Monocytes Relative: 10 %
Neutro Abs: 3.9 10*3/uL (ref 1.7–7.7)
Neutrophils Relative %: 61 %
Platelets: 223 10*3/uL (ref 150–400)
RBC: 4.36 MIL/uL (ref 3.87–5.11)
RDW: 12.9 % (ref 11.5–15.5)
WBC: 6.5 10*3/uL (ref 4.0–10.5)
nRBC: 0 % (ref 0.0–0.2)

## 2019-09-29 LAB — COMPREHENSIVE METABOLIC PANEL
ALT: 18 U/L (ref 0–44)
AST: 22 U/L (ref 15–41)
Albumin: 4 g/dL (ref 3.5–5.0)
Alkaline Phosphatase: 63 U/L (ref 38–126)
Anion gap: 8 (ref 5–15)
BUN: 13 mg/dL (ref 8–23)
CO2: 27 mmol/L (ref 22–32)
Calcium: 8.3 mg/dL — ABNORMAL LOW (ref 8.9–10.3)
Chloride: 100 mmol/L (ref 98–111)
Creatinine, Ser: 0.83 mg/dL (ref 0.44–1.00)
GFR calc Af Amer: >60 mL/min (ref >60)
GFR calc non Af Amer: >60 mL/min (ref >60)
Glucose, Bld: 106 mg/dL — ABNORMAL HIGH (ref 70–99)
Potassium: 3.8 mmol/L (ref 3.5–5.1)
Sodium: 135 mmol/L (ref 135–145)
Total Bilirubin: 0.8 mg/dL (ref 0.3–1.2)
Total Protein: 7.1 g/dL (ref 6.5–8.1)

## 2019-09-29 MED ORDER — IOHEXOL 300 MG/ML  SOLN
85.0000 mL | Freq: Once | INTRAMUSCULAR | Status: AC | PRN
  Administered 2019-09-29: 85 mL via INTRAVENOUS

## 2019-09-30 DIAGNOSIS — H903 Sensorineural hearing loss, bilateral: Secondary | ICD-10-CM | POA: Diagnosis not present

## 2019-09-30 DIAGNOSIS — G4733 Obstructive sleep apnea (adult) (pediatric): Secondary | ICD-10-CM | POA: Diagnosis not present

## 2019-09-30 LAB — CANCER ANTIGEN 15-3: CA 15-3: 28.5 U/mL — ABNORMAL HIGH (ref 0.0–25.0)

## 2019-10-01 ENCOUNTER — Encounter: Payer: Self-pay | Admitting: Oncology

## 2019-10-01 ENCOUNTER — Other Ambulatory Visit: Payer: Self-pay

## 2019-10-01 ENCOUNTER — Inpatient Hospital Stay (HOSPITAL_BASED_OUTPATIENT_CLINIC_OR_DEPARTMENT_OTHER): Payer: PPO | Admitting: Oncology

## 2019-10-01 VITALS — BP 151/80 | HR 88 | Temp 99.4°F | Resp 16 | Ht 67.0 in | Wt 176.1 lb

## 2019-10-01 DIAGNOSIS — Z79811 Long term (current) use of aromatase inhibitors: Secondary | ICD-10-CM | POA: Diagnosis not present

## 2019-10-01 DIAGNOSIS — C50919 Malignant neoplasm of unspecified site of unspecified female breast: Secondary | ICD-10-CM | POA: Diagnosis not present

## 2019-10-01 DIAGNOSIS — H903 Sensorineural hearing loss, bilateral: Secondary | ICD-10-CM | POA: Diagnosis not present

## 2019-10-01 DIAGNOSIS — C50911 Malignant neoplasm of unspecified site of right female breast: Secondary | ICD-10-CM | POA: Diagnosis not present

## 2019-10-03 NOTE — Progress Notes (Signed)
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  Routine follow-up of breast cancer  Heme/Onc history: patient is a 79 year old female with a past medical history significant for right breast cancer in 1995. She underwent mastectomy followed by adjuvant chemotherapy and 5 years of tamoxifen. Patient has been having some low back pain and underwent a CT lumbar spine without contrast on 08/10/2017 which was done by the pain clinic. CT mainly showed age-related degenerative disease but also showed incidental nodular thickening of the posterior left diaphragm concerning for tumor and a CT chest was recommended.  CT chest on 08/14/2017 showed scattered left pleural nodularity with tiny left pleural effusion and prominent left internal mammary and juxtadiaphragmatic lymph nodes which are nonspecific but metastatic disease could not be ruled out.  This was followed by a PET/CT scan on 08/22/2017 which showed numerous small left-sided pleural nodules which were mildly hypermetabolic along with hypermetabolic mediastinal lymph nodes concerning for metastatic disease. Small metastatic nodule in the left upper quadrant partly in the retrocrural space. No findings of pulmonary metastatic or osseous metastatic disease.  Patient underwent FNA of retrocrural LN which was positive for carcinoma but primary could not be ascertained. She then  underwent pleural biopsy which showed metastatic carcinoma consistent with breast primary. ER+HER-2. Patient started taking Ibrance in August 2019.  Ibrance on hold since January 2021 with stable disease on letrozole  NGS testing showedPIKCA and BRCA mutation    Interval history- Patient was seen by pain management and underwent injection in her left hip. Since then she reports significant improvement in her symptoms. She is back to walking, cleaning her house and ambulating without a cane  ECOG PS- 1 Pain scale- 0   Review of systems- Review of Systems  Constitutional: Positive for malaise/fatigue. Negative for chills, fever and weight loss.  HENT: Negative for congestion, ear discharge and nosebleeds.   Eyes: Negative for blurred vision.  Respiratory: Negative for cough, hemoptysis, sputum production, shortness of breath and wheezing.   Cardiovascular: Negative for chest pain, palpitations, orthopnea and claudication.  Gastrointestinal: Negative for abdominal pain, blood in stool, constipation, diarrhea, heartburn, melena, nausea and vomiting.  Genitourinary: Negative for dysuria, flank pain, frequency, hematuria and urgency.  Musculoskeletal: Negative for back pain, joint pain and myalgias.  Skin: Negative for rash.  Neurological: Negative for dizziness, tingling, focal weakness, seizures, weakness and headaches.  Endo/Heme/Allergies: Does not bruise/bleed easily.  Psychiatric/Behavioral: Negative for depression and suicidal ideas. The patient does not have insomnia.       Allergies  Allergen Reactions  . Imipramine Tinitus  . Latex Rash  . Penicillins Rash  . Tape Rash     Past Medical History:  Diagnosis Date  . Anxiety   . Atrial fibrillation ([place])   . Breast cancer ([place])   . Breast cancer, right ([place]) 1999   RT MASTECTOMY and chemo tx's.   . DDD (degenerative disc disease), cervical    hands and back with arthritis  . Depression   .  Diverticulosis   .  Diverticulosis   . DVT of lower extremity (deep venous thrombosis) ([place])   . Dyspnea   . Family history of colon cancer   . Family history of lung cancer   . Family history of throat cancer   . GERD (gastroesophageal reflux disease)   . Goiter   . Headache   . History of chemotherapy 2000   BREAST CA  . Hypertension   . Incontinence   . Neuromuscular disorder ([place])    peipheral neuropathy  . OSA (obstructive sleep apnea)   . Osteoporosis   . Personal history of chemotherapy 1999   BREAST CA  . Status post chemotherapy    2000 right breast cancer  . Thyroid disease   . UTI (urinary tract infection)   . Vertigo      Past Surgical History:  Procedure Laterality Date  . ABDOMINAL HYSTERECTOMY    . BREAST BIOPSY Left 2006   negative  . BUNIONECTOMY Bilateral    Screws implanted.  . CHOLECYSTECTOMY    . COCHLEAR IMPLANT Right   . COLONOSCOPY WITH PROPOFOL N/A 07/24/2016   Procedure: COLONOSCOPY WITH PROPOFOL;  Surgeon: Manya Silvas, MD;  Location: [facility] ENDOSCOPY;  Service: Endoscopy;  Laterality: N/A;  . ESOPHAGOGASTRODUODENOSCOPY (EGD) WITH PROPOFOL N/A 04/12/2016   Procedure: ESOPHAGOGASTRODUODENOSCOPY (EGD) WITH PROPOFOL;  Surgeon: Manya Silvas, MD;  Location: [facility] ENDOSCOPY;  Service: Endoscopy;  Laterality: N/A;  . EYE SURGERY Bilateral    cataract  . FOOT SURGERY Bilateral   . HAND SURGERY Right   . MASTECTOMY Right 1999   BREAST CA  . MASTECTOMY Right 1999  . PARTIAL COLECTOMY    . THYROIDECTOMY    . TOTAL HIP ARTHROPLASTY Left 03/19/2018   Procedure: TOTAL HIP ARTHROPLASTY LEFT;  Surgeon: Hessie Knows, MD;  Location: [facility] ORS;  Service: Orthopedics;  Laterality: Left;  Marland Kitchen VIDEO ASSISTED THORACOSCOPY Left 09/10/2017   Procedure: VIDEO ASSISTED THORACOSCOPY;  Surgeon: [name], MD;  Location: [facility] ORS;  Service: Thoracic;  Laterality: Left;  with biopsies  . VIDEO BRONCHOSCOPY  09/10/2017   Procedure: VIDEO BRONCHOSCOPY;  Surgeon: [name], MD;   Location: [facility] ORS;  Service: Thoracic;;    Social History   Socioeconomic History  . Marital status: Married    Spouse name: evert  . Number of children: 4  . Years of education: Not on file  . Highest education level: 9th grade  Occupational History  . Not on file  Tobacco Use  . Smoking status: Never Smoker  . Smokeless tobacco: Never Used  Vaping Use  . Vaping Use: Never used  Substance and Sexual Activity  . Alcohol use: No    Alcohol/week: 0.0 standard drinks  . Drug use: No  . Sexual activity: Not Currently  Other Topics Concern  . Not on file  Social History Narrative  . Not on file   Social Determinants of Health   Financial Resource Strain:   . Difficulty of Paying Living Expenses:   Food Insecurity:   . Worried About Charity fundraiser in the Last Year:   . Arboriculturist in the Last Year:   Transportation Needs:   . Film/video editor (Medical):   Marland Kitchen Lack of Transportation (Non-Medical):   Physical Activity:   . Days of Exercise per Week:   . Minutes of Exercise per Session:   Stress:   . Feeling of Stress :   Social Connections:   . Frequency of Communication with  Friends and Family:   . Frequency of Social Gatherings with Friends and Family:   . Attends Religious Services:   . Active Member of Clubs or Organizations:   . Attends Archivist Meetings:   Marland Kitchen Marital Status:   Intimate Partner Violence:   . Fear of Current or Ex-Partner:   . Emotionally Abused:   Marland Kitchen Physically Abused:   . Sexually Abused:     Family History  Problem Relation Age of Onset  . Dementia Mother   . Dementia Sister   . Diabetes Sister   . Heart attack Brother 18  . COPD Brother   . Lung cancer Brother        hx smoking  . Colon cancer Brother   . Liver cancer Brother   . COPD Brother   . Lung cancer Brother        hx smoking  . Colon cancer Maternal Grandfather        dx >.50  . Breast cancer Neg Hx      Current Outpatient Medications:  .   acetaminophen (TYLENOL) 325 MG tablet, Take 650 mg by mouth every 4 (four) hours as needed. For pain / increased temp.  May be administered orally,  per G-Tube if needed or rectally if unable to swallow (separate order).  Maximum dose for 24 hours is 3,000 mg from all sources of Acetaminophen / Tylenol, Disp: , Rfl:  .  atorvastatin (LIPITOR) 40 MG tablet, Take 1 tablet (40 mg total) by mouth at bedtime., Disp: 30 tablet, Rfl: 0 .  busPIRone (BUSPAR) 10 MG tablet, Take 1 tablet (10 mg total) by mouth 2 (two) times daily., Disp: 180 tablet, Rfl: 0 .  cholecalciferol (VITAMIN D) 1000 units tablet, Take 1,000 Units by mouth daily., Disp: , Rfl:  .  cyanocobalamin (V-R VITAMIN B-12) 500 MCG tablet, Take 500 mcg by mouth daily. , Disp: , Rfl:  .  DULoxetine (CYMBALTA) 60 MG capsule, Take 1 capsule (60 mg total) by mouth every morning., Disp: 90 capsule, Rfl: 0 .  gabapentin (NEURONTIN) 300 MG capsule, Take 300 mg by mouth 2 (two) times daily., Disp: , Rfl:  .  letrozole (FEMARA) 2.5 MG tablet, TAKE 1 TABLET BY MOUTH EVERY DAY, Disp: 90 tablet, Rfl: 1 .  levothyroxine (SYNTHROID) 100 MCG tablet, Take 1 tablet by mouth daily., Disp: , Rfl:  .  mirabegron ER (MYRBETRIQ) 50 MG TB24 tablet, Take 1 tablet (50 mg total) by mouth daily., Disp: 90 tablet, Rfl: 3 .  ondansetron (ZOFRAN) 4 MG tablet, Take 1 tablet (4 mg total) by mouth every 6 (six) hours as needed for nausea or vomiting., Disp: 21 tablet, Rfl: 1 .  pantoprazole (PROTONIX) 40 MG tablet, Take 1 tablet (40 mg total) by mouth 2 (two) times daily., Disp: 60 tablet, Rfl: 0 .  traZODone (DESYREL) 100 MG tablet, Take 1 tablet (100 mg total) by mouth at bedtime., Disp: 90 tablet, Rfl: 0  Physical exam:  Vitals:   10/01/19 1449  BP: (!) 151/80  Pulse: 88  Resp: 16  Temp: 99.4 F (37.4 C)  TempSrc: Oral  Weight: 176 lb 1.6 oz (79.9 kg)  Height: _0  (1.702 m)   Physical Exam Constitutional:      General: She is not in acute  distress. Cardiovascular:     Rate and Rhythm: Normal rate and regular rhythm.     Heart sounds: Normal heart sounds.  Pulmonary:     Effort: Pulmonary effort is normal.  Breath sounds: Normal breath sounds.  Abdominal:     General: Bowel sounds are normal.     Palpations: Abdomen is soft.  Skin:    General: Skin is warm and dry.  Neurological:     Mental Status: She is alert and oriented to person, place, and time.      CMP Latest Ref Rng & Units 09/29/2019  Glucose 70 - 99 mg/dL 106(H)  BUN 8 - 23 mg/dL 13  Creatinine 0.44 - 1.00 mg/dL 0.83  Sodium 135 - 145 mmol/L 135  Potassium 3.5 - 5.1 mmol/L 3.8  Chloride 98 - 111 mmol/L 100  CO2 22 - 32 mmol/L 27  Calcium 8.9 - 10.3 mg/dL 8.3(L)  Total Protein 6.5 - 8.1 g/dL 7.1  Total Bilirubin 0.3 - 1.2 mg/dL 0.8  Alkaline Phos 38 - 126 U/L 63  AST 15 - 41 U/L 22  ALT 0 - 44 U/L 18   CBC Latest Ref Rng & Units 09/29/2019  WBC 4.0 - 10.5 K/uL 6.5  Hemoglobin 12.0 - 15.0 g/dL 12.4  Hematocrit 36 - 46 % 38.2  Platelets 150 - 400 K/uL 223    No images are attached to the encounter.  CT Chest W Contrast  Result Date: 09/29/2019 CLINICAL DATA:  Metastatic right breast cancer status post mastectomy in 1999. Ongoing medical therapy. Restaging. EXAM: CT CHEST, ABDOMEN, AND PELVIS WITH CONTRAST TECHNIQUE: Multidetector CT imaging of the chest, abdomen and pelvis was performed following the standard protocol during bolus administration of intravenous contrast. CONTRAST:  19m OMNIPAQUE IOHEXOL 300 MG/ML  SOLN COMPARISON:  04/30/2019 CT chest, abdomen and pelvis. FINDINGS: CT CHEST FINDINGS Cardiovascular: Normal heart size. No significant pericardial effusion/thickening. Atherosclerotic nonaneurysmal thoracic aorta. Normal caliber pulmonary arteries. No central pulmonary emboli. Mediastinum/Nodes: Apparent total thyroidectomy. Unremarkable esophagus. Surgical clips again noted in the right axilla. No pathologically enlarged axillary lymph  nodes. No pathologically enlarged mediastinal or hilar lymph nodes. Tiny asymmetrically prominent 0.3 cm left internal mammary node (series 2/image 23), stable. Lungs/Pleura: No pneumothorax. No pleural effusion. No acute consolidative airspace disease or lung masses. Stable tiny calcified subcentimeter peripheral left upper lobe granuloma. Stable 3 mm nodule along the left major fissure (series 3/image 72). Stable 3 mm medial right middle lobe solid pulmonary nodule (series 3/image 97). No new significant pulmonary nodules. Musculoskeletal: No aggressive appearing focal osseous lesions. Moderate thoracic spondylosis. Stable postsurgical changes from right mastectomy. No ventral chest wall masses. CT ABDOMEN PELVIS FINDINGS Hepatobiliary: Normal liver with no liver mass. Cholecystectomy. Bile ducts are stable and within normal post cholecystectomy limits with CBD diameter 6 mm. Pancreas: Normal, with no mass or duct dilation. Spleen: Normal size. No mass. Adrenals/Urinary Tract: Normal adrenals. Subcentimeter exophytic hypodense renal cortical lesion in the lateral interpolar left kidney, too small to characterize, stable, considered benign. Otherwise normal kidneys, with no hydronephrosis. Bladder obscured by streak artifact from left hip with no gross bladder abnormality. Stable periurethral hyperdensity, presumably postprocedural. Stomach/Bowel: Small to moderate hiatal hernia. Otherwise normal nondistended stomach. Normal caliber small bowel with no small bowel wall thickening. Stable postsurgical changes from subtotal right hemicolectomy with ileocolic anastomosis in the right abdomen. Scattered mild colonic diverticulosis, most prominent in the sigmoid colon, with no large bowel wall thickening or significant pericolonic fat stranding. Moderate colonic stool. Vascular/Lymphatic: Atherosclerotic nonaneurysmal abdominal aorta. Patent portal, splenic, hepatic and renal veins. No pathologically enlarged lymph  nodes in the abdomen or pelvis. Reproductive: Deep pelvis obscured by streak artifact. No adnexal masses. Apparent hysterectomy. Other: No  pneumoperitoneum, ascites or focal fluid collection. Musculoskeletal: No aggressive appearing focal osseous lesions. Left total hip arthroplasty. IMPRESSION: 1. No findings suspicious for recurrent metastatic disease in the chest, abdomen or pelvis. Stable tiny pulmonary nodules and stable asymmetric tiny left internal mammary node. No recurrent pleural effusion. 2. Moderate colonic stool suggests constipation. 3. Chronic findings include: Small to moderate hiatal hernia. Mild colonic diverticulosis. Aortic Atherosclerosis (ICD10-I70.0). Electronically Signed   By: Ilona Sorrel M.D.   On: 09/29/2019 14:00   CT ABDOMEN PELVIS W CONTRAST  Result Date: 09/29/2019 CLINICAL DATA:  Metastatic right breast cancer status post mastectomy in 1999. Ongoing medical therapy. Restaging. EXAM: CT CHEST, ABDOMEN, AND PELVIS WITH CONTRAST TECHNIQUE: Multidetector CT imaging of the chest, abdomen and pelvis was performed following the standard protocol during bolus administration of intravenous contrast. CONTRAST:  29m OMNIPAQUE IOHEXOL 300 MG/ML  SOLN COMPARISON:  04/30/2019 CT chest, abdomen and pelvis. FINDINGS: CT CHEST FINDINGS Cardiovascular: Normal heart size. No significant pericardial effusion/thickening. Atherosclerotic nonaneurysmal thoracic aorta. Normal caliber pulmonary arteries. No central pulmonary emboli. Mediastinum/Nodes: Apparent total thyroidectomy. Unremarkable esophagus. Surgical clips again noted in the right axilla. No pathologically enlarged axillary lymph nodes. No pathologically enlarged mediastinal or hilar lymph nodes. Tiny asymmetrically prominent 0.3 cm left internal mammary node (series 2/image 23), stable. Lungs/Pleura: No pneumothorax. No pleural effusion. No acute consolidative airspace disease or lung masses. Stable tiny calcified subcentimeter peripheral  left upper lobe granuloma. Stable 3 mm nodule along the left major fissure (series 3/image 72). Stable 3 mm medial right middle lobe solid pulmonary nodule (series 3/image 97). No new significant pulmonary nodules. Musculoskeletal: No aggressive appearing focal osseous lesions. Moderate thoracic spondylosis. Stable postsurgical changes from right mastectomy. No ventral chest wall masses. CT ABDOMEN PELVIS FINDINGS Hepatobiliary: Normal liver with no liver mass. Cholecystectomy. Bile ducts are stable and within normal post cholecystectomy limits with CBD diameter 6 mm. Pancreas: Normal, with no mass or duct dilation. Spleen: Normal size. No mass. Adrenals/Urinary Tract: Normal adrenals. Subcentimeter exophytic hypodense renal cortical lesion in the lateral interpolar left kidney, too small to characterize, stable, considered benign. Otherwise normal kidneys, with no hydronephrosis. Bladder obscured by streak artifact from left hip with no gross bladder abnormality. Stable periurethral hyperdensity, presumably postprocedural. Stomach/Bowel: Small to moderate hiatal hernia. Otherwise normal nondistended stomach. Normal caliber small bowel with no small bowel wall thickening. Stable postsurgical changes from subtotal right hemicolectomy with ileocolic anastomosis in the right abdomen. Scattered mild colonic diverticulosis, most prominent in the sigmoid colon, with no large bowel wall thickening or significant pericolonic fat stranding. Moderate colonic stool. Vascular/Lymphatic: Atherosclerotic nonaneurysmal abdominal aorta. Patent portal, splenic, hepatic and renal veins. No pathologically enlarged lymph nodes in the abdomen or pelvis. Reproductive: Deep pelvis obscured by streak artifact. No adnexal masses. Apparent hysterectomy. Other: No pneumoperitoneum, ascites or focal fluid collection. Musculoskeletal: No aggressive appearing focal osseous lesions. Left total hip arthroplasty. IMPRESSION: 1. No findings  suspicious for recurrent metastatic disease in the chest, abdomen or pelvis. Stable tiny pulmonary nodules and stable asymmetric tiny left internal mammary node. No recurrent pleural effusion. 2. Moderate colonic stool suggests constipation. 3. Chronic findings include: Small to moderate hiatal hernia. Mild colonic diverticulosis. Aortic Atherosclerosis (ICD10-I70.0). Electronically Signed   By: JIlona SorrelM.D.   On: 09/29/2019 14:00     Assessment and plan- Patient is a 79y.o. female with stage IV breast cancer ER positive with low volume metastatic pleural nodules currently on letrozole.  She is here to discuss CT scan results  and further management.  CT chest abdomen and pelvis with contrast done on 09/29/2019 showed no concerning findings of recurrent or progressive disease.  She has now remained off Ibrance since October 2020 with stable disease.  She continues to tolerate letrozole well.  I will see her back in 6 months with CBC with differential CMP and CA 15-3 with scans prior.  I will consider getting a bone density scan at that time    Visit Diagnosis 1. Metastatic breast cancer ([place])   2. Use of letrozole ([place])      Dr. Randa Evens, MD, MPH [facility] [phone] 10/03/2019 5:53 PM

## 2019-10-09 DIAGNOSIS — M5136 Other intervertebral disc degeneration, lumbar region: Secondary | ICD-10-CM | POA: Diagnosis not present

## 2019-10-09 DIAGNOSIS — M5416 Radiculopathy, lumbar region: Secondary | ICD-10-CM | POA: Diagnosis not present

## 2019-10-14 NOTE — Progress Notes (Signed)
10/15/2019 4:38 PM   Breanna Zhang 12-20-1940 [id number]  Referring provider: Adin Hector, MD [facility] [place],  [place] [id number] Chief Complaint  Patient presents with  . Follow-up    HPI: Breanna Zhang is a 79 y.o. female with a personal history of recurrent urinary tract infections, atrophic vaginitis and severe urgency/OAB and urge incontinence who returns for a 6 month follow up.   Previously was seen and evaluated by [facility] urogynecology.  She reports that she had a pelvic exam which showed evidence of good vaginal vault support without cystocele with atrophy.  This is supported by documentation from this visit.  She was started on topical estrogen cream.  She reports that she was not able to tolerate this because of the burning and irritation.  She was symptomatic at the time notably.  She has not tried it since she is been asymptomatic.   She was previously followed by Dr. Jacqlyn Larsen in the remote past.  She has tried numerous anticholinergics without success.  She is also failed Botox in the past as well.  She reports that she was told that she failed this because she went into retention after the procedure and had have a catheter which negated the response of the medication supposedly.  She does have a personal history of breast cancer.  Routine UA in 05/2019 was unremarkable. Since last visit there are no records of infection.   Recent CT chest, abdomen and pelvis on 09/29/2019 revealed no findings suspicious for recurrent metastatic disease in the chest, abdomen or pelvis. Stable tiny pulmonary nodules and stable asymmetric tiny left internal mammary node. No recurrent pleural effusion. Moderate colonic stool suggests constipation.  PVR is 23 mL today. She is emptying her bladder well.   Her symptoms are improving with Myrbetriq 50 mg. She is happy with her current symptoms.    PMH: Past Medical History:  Diagnosis Date  . Anxiety   .  Atrial fibrillation ([place])   . Breast cancer ([place])   . Breast cancer, right ([place]) 1999   RT MASTECTOMY and chemo tx's.   . DDD (degenerative disc disease), cervical    hands and back with arthritis  . Depression   . Diverticulosis   . Diverticulosis   . DVT of lower extremity (deep venous thrombosis) ([place])   . Dyspnea   . Family history of colon cancer   . Family history of lung cancer   . Family history of throat cancer   . GERD (gastroesophageal reflux disease)   . Goiter   . Headache   . History of chemotherapy 2000   BREAST CA  . Hypertension   . Incontinence   . Neuromuscular disorder ([place])    peipheral neuropathy  . OSA (obstructive sleep apnea)   . Osteoporosis   . Personal history of chemotherapy 1999   BREAST CA  . Status post chemotherapy    2000 right breast cancer  . Thyroid disease   . UTI (urinary tract infection)   . Vertigo     Surgical History: Past Surgical History:  Procedure Laterality Date  . ABDOMINAL HYSTERECTOMY    . BREAST BIOPSY Left 2006   negative  . BUNIONECTOMY Bilateral    Screws implanted.  . CHOLECYSTECTOMY    . COCHLEAR IMPLANT Right   . COLONOSCOPY WITH PROPOFOL N/A 07/24/2016   Procedure: COLONOSCOPY WITH PROPOFOL;  Surgeon: Manya Silvas, MD;  Location: [facility] ENDOSCOPY;  Service: Endoscopy;  Laterality:  N/A;  . ESOPHAGOGASTRODUODENOSCOPY (EGD) WITH PROPOFOL N/A 04/12/2016   Procedure: ESOPHAGOGASTRODUODENOSCOPY (EGD) WITH PROPOFOL;  Surgeon: Manya Silvas, MD;  Location: [facility] ENDOSCOPY;  Service: Endoscopy;  Laterality: N/A;  . EYE SURGERY Bilateral    cataract  . FOOT SURGERY Bilateral   . HAND SURGERY Right   . MASTECTOMY Right 1999   BREAST CA  . MASTECTOMY Right 1999  . PARTIAL COLECTOMY    . THYROIDECTOMY    . TOTAL HIP ARTHROPLASTY Left 03/19/2018   Procedure: TOTAL HIP ARTHROPLASTY LEFT;  Surgeon: Hessie Knows, MD;  Location: [facility] ORS;  Service: Orthopedics;  Laterality: Left;  Marland Kitchen VIDEO ASSISTED THORACOSCOPY Left  09/10/2017   Procedure: VIDEO ASSISTED THORACOSCOPY;  Surgeon: [name], MD;  Location: [facility] ORS;  Service: Thoracic;  Laterality: Left;  with biopsies  . VIDEO BRONCHOSCOPY  09/10/2017   Procedure: VIDEO BRONCHOSCOPY;  Surgeon: [name], MD;  Location: [facility] ORS;  Service: Thoracic;;    Home Medications:  Allergies as of 10/15/2019      Reactions   Imipramine Tinitus   Latex Rash   Penicillins Rash   Tape Rash      Medication List       Accurate as of October 15, 2019  4:38 PM. If you have any questions, ask your nurse or doctor.        acetaminophen 325 MG tablet Commonly known as: TYLENOL Take 650 mg by mouth every 4 (four) hours as needed. For pain / increased temp.  May be administered orally,  per G-Tube if needed or rectally if unable to swallow (separate order).  Maximum dose for 24 hours is 3,000 mg from all sources of Acetaminophen / Tylenol   atorvastatin 40 MG tablet Commonly known as: LIPITOR Take 1 tablet (40 mg total) by mouth at bedtime.   busPIRone 10 MG tablet Commonly known as: BUSPAR Take 1 tablet (10 mg total) by mouth 2 (two) times daily.   cholecalciferol 1000 units tablet Commonly known as: VITAMIN D Take 1,000 Units by mouth daily.   DULoxetine 60 MG capsule Commonly known as: CYMBALTA Take 1 capsule (60 mg total) by mouth every morning.   gabapentin 300 MG capsule Commonly known as: NEURONTIN Take 300 mg by mouth 2 (two) times daily.   letrozole 2.5 MG tablet Commonly known as: FEMARA TAKE 1 TABLET BY MOUTH EVERY DAY   levothyroxine 100 MCG tablet Commonly known as: SYNTHROID Take 1 tablet by mouth daily.   mirabegron ER 50 MG Tb24 tablet Commonly known as: MYRBETRIQ Take 1 tablet (50 mg total) by mouth daily.   ondansetron 4 MG tablet Commonly known as: ZOFRAN Take 1 tablet (4 mg total) by mouth every 6 (six) hours as needed for nausea or vomiting.   pantoprazole 40 MG tablet Commonly known as: PROTONIX Take 1 tablet (40 mg  total) by mouth 2 (two) times daily.   traZODone 100 MG tablet Commonly known as: DESYREL Take 1 tablet (100 mg total) by mouth at bedtime.   V-R VITAMIN B-12 500 MCG tablet Generic drug: vitamin B-12 Take 500 mcg by mouth daily.       Allergies:  Allergies  Allergen Reactions  . Imipramine Tinitus  . Latex Rash  . Penicillins Rash  . Tape Rash    Family History: Family History  Problem Relation Age of Onset  . Dementia Mother   . Dementia Sister   . Diabetes Sister   . Heart attack Brother 45  . COPD Brother   .  Lung cancer Brother        hx smoking  . Colon cancer Brother   . Liver cancer Brother   . COPD Brother   . Lung cancer Brother        hx smoking  . Colon cancer Maternal Grandfather        dx >.50  . Breast cancer Neg Hx     Social History:  reports that she has never smoked. She has never used smokeless tobacco. She reports that she does not drink alcohol and does not use drugs.   Physical Exam: BP 133/68   Pulse 89   Constitutional:  Alert and oriented, No acute distress. HEENT: [place] AT, moist mucus membranes.  Trachea midline, no masses. Cardiovascular: No clubbing, cyanosis, or edema. Respiratory: Normal respiratory effort, no increased work of breathing. Skin: No rashes, bruises or suspicious lesions. Neurologic: Grossly intact, no focal deficits, moving all 4 extremities. Psychiatric: Normal mood and affect.  Laboratory Data:  Lab Results  Component Value Date   CREATININE 0.83 09/29/2019     Assessment & Plan:    1. rUTI No recent infections since last visit.  Continue current regimen with cranberry tablets, probiotics  Percentage of breast cancer, no longer using topical estrogen cream Recommend presenting if she has signs symptoms of infection for evaluation  2. OAB (overactive bladder)/ Urge incontinence Severe refractory OAB symptoms improved on Myrbetrqi 50 mg Continue Myrbetriq 50 mg.  PVR is 23 mL, adequate  emptying Discussed PTNS but patient kindly declined due to her satisfaction of current symptoms.    Follow up in 1 year w/ PA-C.    [place] [address], [place], [place] [address] [phone]  I, Selena Batten, am acting as a scribe for Dr. Hollice Espy.  I have reviewed the above documentation for accuracy and completeness, and I agree with the above.   Hollice Espy, MD

## 2019-10-15 ENCOUNTER — Ambulatory Visit (INDEPENDENT_AMBULATORY_CARE_PROVIDER_SITE_OTHER): Payer: PPO | Admitting: Urology

## 2019-10-15 ENCOUNTER — Encounter: Payer: Self-pay | Admitting: Urology

## 2019-10-15 ENCOUNTER — Other Ambulatory Visit: Payer: Self-pay

## 2019-10-15 VITALS — BP 133/68 | HR 89

## 2019-10-15 DIAGNOSIS — N3941 Urge incontinence: Secondary | ICD-10-CM | POA: Diagnosis not present

## 2019-10-15 DIAGNOSIS — H353221 Exudative age-related macular degeneration, left eye, with active choroidal neovascularization: Secondary | ICD-10-CM | POA: Diagnosis not present

## 2019-10-15 DIAGNOSIS — N390 Urinary tract infection, site not specified: Secondary | ICD-10-CM

## 2019-10-15 DIAGNOSIS — N3281 Overactive bladder: Secondary | ICD-10-CM | POA: Diagnosis not present

## 2019-10-15 LAB — BLADDER SCAN AMB NON-IMAGING: Scan Result: 23

## 2019-10-15 MED ORDER — MIRABEGRON ER 50 MG PO TB24: 50.0000 mg | ORAL_TABLET | Freq: Every day | ORAL | 3 refills | Status: DC

## 2019-10-23 ENCOUNTER — Telehealth (INDEPENDENT_AMBULATORY_CARE_PROVIDER_SITE_OTHER): Payer: PPO | Admitting: Psychiatry

## 2019-10-23 ENCOUNTER — Encounter (HOSPITAL_COMMUNITY): Payer: Self-pay | Admitting: Psychiatry

## 2019-10-23 ENCOUNTER — Other Ambulatory Visit: Payer: Self-pay

## 2019-10-23 DIAGNOSIS — F3342 Major depressive disorder, recurrent, in full remission: Secondary | ICD-10-CM

## 2019-10-23 DIAGNOSIS — F4310 Post-traumatic stress disorder, unspecified: Secondary | ICD-10-CM | POA: Diagnosis not present

## 2019-10-23 MED ORDER — TRAZODONE HCL 100 MG PO TABS: 100.0000 mg | ORAL_TABLET | Freq: Every day | ORAL | 0 refills | Status: DC

## 2019-10-23 MED ORDER — BUSPIRONE HCL 10 MG PO TABS: 10.0000 mg | ORAL_TABLET | Freq: Two times a day (BID) | ORAL | 0 refills | Status: DC

## 2019-10-23 MED ORDER — DULOXETINE HCL 60 MG PO CPEP: 60.0000 mg | ORAL_CAPSULE | Freq: Every morning | ORAL | 0 refills | Status: DC

## 2019-10-23 NOTE — Progress Notes (Signed)
[place] MD OP Progress Note  Virtual Visit via Telephone Note  I connected with Breanna Zhang on 10/23/19 at 11:00 AM EDT by telephone and verified that I am speaking with the correct person using two identifiers.  Location: Patient: home Provider: Clinic   I discussed the limitations, risks, security and privacy concerns of performing an evaluation and management service by telephone and the availability of in person appointments. I also discussed with the patient that there may be a patient responsible charge related to this service. The patient expressed understanding and agreed to proceed.   Of note, pt is hard-of-hearing.  I provided 12 minutes of non-face-to-face time during this encounter.   10/23/2019 11:11 AM Breanna Zhang  MRN:  [id number]  Chief Complaint:  As per patient, " I am much better than I have been."  HPI: Patient informed that she is doing much better compared to the past.  She stated that she was in great pain in her leg and they (other provider) finally found out that she had severe arthritis which was causing her to have the pain.  Ever since her pain has been under control she has felt like herself again.  She stated that everything is going well and she wants to keep her medicines the same way they are.  She informed that her anxiety and mood are stable.  She is also sleeping well at night.  Visit Diagnosis:    ICD-10-CM   1. MDD (major depressive disorder), recurrent, in full remission ([place])  F33.42   2. Post traumatic stress disorder (PTSD)  F43.10     Past Psychiatric History: depression, PTSD  Past Medical History:  Past Medical History:  Diagnosis Date  . Anxiety   . Atrial fibrillation ([place])   . Breast cancer ([place])   . Breast cancer, right ([place]) 1999   RT MASTECTOMY and chemo tx's.   . DDD (degenerative disc disease), cervical    hands and back with arthritis  . Depression   . Diverticulosis   . Diverticulosis   . DVT of lower extremity (deep  venous thrombosis) ([place])   . Dyspnea   . Family history of colon cancer   . Family history of lung cancer   . Family history of throat cancer   . GERD (gastroesophageal reflux disease)   . Goiter   . Headache   . History of chemotherapy 2000   BREAST CA  . Hypertension   . Incontinence   . Neuromuscular disorder ([place])    peipheral neuropathy  . OSA (obstructive sleep apnea)   . Osteoporosis   . Personal history of chemotherapy 1999   BREAST CA  . Status post chemotherapy    2000 right breast cancer  . Thyroid disease   . UTI (urinary tract infection)   . Vertigo     Past Surgical History:  Procedure Laterality Date  . ABDOMINAL HYSTERECTOMY    . BREAST BIOPSY Left 2006   negative  . BUNIONECTOMY Bilateral    Screws implanted.  . CHOLECYSTECTOMY    . COCHLEAR IMPLANT Right   . COLONOSCOPY WITH PROPOFOL N/A 07/24/2016   Procedure: COLONOSCOPY WITH PROPOFOL;  Surgeon: Manya Silvas, MD;  Location: [facility] ENDOSCOPY;  Service: Endoscopy;  Laterality: N/A;  . ESOPHAGOGASTRODUODENOSCOPY (EGD) WITH PROPOFOL N/A 04/12/2016   Procedure: ESOPHAGOGASTRODUODENOSCOPY (EGD) WITH PROPOFOL;  Surgeon: Manya Silvas, MD;  Location: [facility] ENDOSCOPY;  Service: Endoscopy;  Laterality: N/A;  . EYE SURGERY Bilateral    cataract  .  FOOT SURGERY Bilateral   . HAND SURGERY Right   . MASTECTOMY Right 1999   BREAST CA  . MASTECTOMY Right 1999  . PARTIAL COLECTOMY    . THYROIDECTOMY    . TOTAL HIP ARTHROPLASTY Left 03/19/2018   Procedure: TOTAL HIP ARTHROPLASTY LEFT;  Surgeon: Hessie Knows, MD;  Location: [facility] ORS;  Service: Orthopedics;  Laterality: Left;  Marland Kitchen VIDEO ASSISTED THORACOSCOPY Left 09/10/2017   Procedure: VIDEO ASSISTED THORACOSCOPY;  Surgeon: [name], MD;  Location: [facility] ORS;  Service: Thoracic;  Laterality: Left;  with biopsies  . VIDEO BRONCHOSCOPY  09/10/2017   Procedure: VIDEO BRONCHOSCOPY;  Surgeon: [name], MD;  Location: [facility] ORS;  Service: Thoracic;;    Family  Psychiatric History: see below  Family History:  Family History  Problem Relation Age of Onset  . Dementia Mother   . Dementia Sister   . Diabetes Sister   . Heart attack Brother 51  . COPD Brother   . Lung cancer Brother        hx smoking  . Colon cancer Brother   . Liver cancer Brother   . COPD Brother   . Lung cancer Brother        hx smoking  . Colon cancer Maternal Grandfather        dx >.50  . Breast cancer Neg Hx     Social History:  Social History   Socioeconomic History  . Marital status: Married    Spouse name: evert  . Number of children: 4  . Years of education: Not on file  . Highest education level: 9th grade  Occupational History  . Not on file  Tobacco Use  . Smoking status: Never Smoker  . Smokeless tobacco: Never Used  Vaping Use  . Vaping Use: Never used  Substance and Sexual Activity  . Alcohol use: No    Alcohol/week: 0.0 standard drinks  . Drug use: No  . Sexual activity: Not Currently  Other Topics Concern  . Not on file  Social History Narrative  . Not on file   Social Determinants of Health   Financial Resource Strain:   . Difficulty of Paying Living Expenses:   Food Insecurity:   . Worried About Charity fundraiser in the Last Year:   . Arboriculturist in the Last Year:   Transportation Needs:   . Film/video editor (Medical):   Marland Kitchen Lack of Transportation (Non-Medical):   Physical Activity:   . Days of Exercise per Week:   . Minutes of Exercise per Session:   Stress:   . Feeling of Stress :   Social Connections:   . Frequency of Communication with Friends and Family:   . Frequency of Social Gatherings with Friends and Family:   . Attends Religious Services:   . Active Member of Clubs or Organizations:   . Attends Archivist Meetings:   Marland Kitchen Marital Status:     Allergies:  Allergies  Allergen Reactions  . Imipramine Tinitus  . Latex Rash  . Penicillins Rash  . Tape Rash    Metabolic Disorder Labs: No  results found for: HGBA1C, MPG No results found for: PROLACTIN No results found for: CHOL, TRIG, HDL, CHOLHDL, VLDL, LDLCALC Lab Results  Component Value Date   TSH 0.275 (L) 11/09/2012   TSH 0.443 (L) 10/12/2012    Therapeutic Level Labs: No results found for: LITHIUM No results found for: VALPROATE No components found for:  CBMZ  Current Medications: Current  Outpatient Medications  Medication Sig Dispense Refill  . acetaminophen (TYLENOL) 325 MG tablet Take 650 mg by mouth every 4 (four) hours as needed. For pain / increased temp.  May be administered orally,  per G-Tube if needed or rectally if unable to swallow (separate order).  Maximum dose for 24 hours is 3,000 mg from all sources of Acetaminophen / Tylenol    . atorvastatin (LIPITOR) 40 MG tablet Take 1 tablet (40 mg total) by mouth at bedtime. 30 tablet 0  . busPIRone (BUSPAR) 10 MG tablet Take 1 tablet (10 mg total) by mouth 2 (two) times daily. 180 tablet 0  . cholecalciferol (VITAMIN D) 1000 units tablet Take 1,000 Units by mouth daily.    . cyanocobalamin (V-R VITAMIN B-12) 500 MCG tablet Take 500 mcg by mouth daily.     . DULoxetine (CYMBALTA) 60 MG capsule Take 1 capsule (60 mg total) by mouth every morning. 90 capsule 0  . gabapentin (NEURONTIN) 300 MG capsule Take 300 mg by mouth 2 (two) times daily.    Marland Kitchen letrozole (FEMARA) 2.5 MG tablet TAKE 1 TABLET BY MOUTH EVERY DAY 90 tablet 1  . levothyroxine (SYNTHROID) 100 MCG tablet Take 1 tablet by mouth daily.    . mirabegron ER (MYRBETRIQ) 50 MG TB24 tablet Take 1 tablet (50 mg total) by mouth daily. 90 tablet 3  . ondansetron (ZOFRAN) 4 MG tablet Take 1 tablet (4 mg total) by mouth every 6 (six) hours as needed for nausea or vomiting. 21 tablet 1  . pantoprazole (PROTONIX) 40 MG tablet Take 1 tablet (40 mg total) by mouth 2 (two) times daily. 60 tablet 0  . traZODone (DESYREL) 100 MG tablet Take 1 tablet (100 mg total) by mouth at bedtime. 90 tablet 0   No current  facility-administered medications for this visit.      Psychiatric Specialty Exam: Review of Systems  There were no vitals taken for this visit.There is no height or weight on file to calculate BMI.  General Appearance: unable to assess due to phone visit  Eye Contact:  Unable to assess due to phone visit  Speech:  Clear and Coherent and Normal Rate  Volume:  Normal  Mood:  Euthymic  Affect:  Congruent  Thought Process:  Goal Directed, Linear and Descriptions of Associations: Intact  Orientation:  Full (Time, Place, and Person)  Thought Content: Logical   Suicidal Thoughts:  No  Homicidal Thoughts:  No  Memory:  Recent;   Good Remote;   Good  Judgement:  Good  Insight:  Good  Psychomotor Activity:  Normal  Concentration:  Concentration: Good and Attention Span: Good  Recall:  Good  Fund of Knowledge: Good  Language: Good  Akathisia:  Negative  Handed:  Right  AIMS (if indicated): not done  Assets:  Communication Skills Desire for Improvement Financial Resources/Insurance Housing  ADL's:  Intact  Cognition: WNL  Sleep:  Good    Assessment and Plan: Patient appears to be stable on her current regimen.   1. MDD (major depressive disorder), recurrent, in full remission ([place])  - DULoxetine (CYMBALTA) 60 MG capsule; Take 1 capsule (60 mg total) by mouth every morning.  Dispense: 90 capsule; Refill: 0 - traZODone (DESYREL) 100 MG tablet; Take 1 tablet (100 mg total) by mouth at bedtime.  Dispense: 90 tablet; Refill: 0 - busPIRone (BUSPAR) 10 MG tablet; Take 1 tablet (10 mg total) by mouth 2 (two) times daily.  Dispense: 180 tablet; Refill: 0  2. Post  traumatic stress disorder (PTSD)  - DULoxetine (CYMBALTA) 60 MG capsule; Take 1 capsule (60 mg total) by mouth every morning.  Dispense: 90 capsule; Refill: 0  Continue same regimen. Follow up in 3 months.    [place] Crane, MD 10/23/2019, 11:11 AM

## 2019-10-27 ENCOUNTER — Encounter: Payer: Self-pay | Admitting: Oncology

## 2019-10-30 DIAGNOSIS — M5136 Other intervertebral disc degeneration, lumbar region: Secondary | ICD-10-CM | POA: Diagnosis not present

## 2019-10-30 DIAGNOSIS — M5416 Radiculopathy, lumbar region: Secondary | ICD-10-CM | POA: Diagnosis not present

## 2019-10-31 DIAGNOSIS — G4733 Obstructive sleep apnea (adult) (pediatric): Secondary | ICD-10-CM | POA: Diagnosis not present

## 2019-11-07 DIAGNOSIS — H353211 Exudative age-related macular degeneration, right eye, with active choroidal neovascularization: Secondary | ICD-10-CM | POA: Diagnosis not present

## 2019-11-18 DIAGNOSIS — E034 Atrophy of thyroid (acquired): Secondary | ICD-10-CM | POA: Diagnosis not present

## 2019-11-18 DIAGNOSIS — C50919 Malignant neoplasm of unspecified site of unspecified female breast: Secondary | ICD-10-CM | POA: Diagnosis not present

## 2019-11-18 DIAGNOSIS — E785 Hyperlipidemia, unspecified: Secondary | ICD-10-CM | POA: Diagnosis not present

## 2019-11-18 DIAGNOSIS — I10 Essential (primary) hypertension: Secondary | ICD-10-CM | POA: Diagnosis not present

## 2019-11-18 DIAGNOSIS — R739 Hyperglycemia, unspecified: Secondary | ICD-10-CM | POA: Diagnosis not present

## 2019-11-23 DIAGNOSIS — I700 Atherosclerosis of aorta: Secondary | ICD-10-CM | POA: Insufficient documentation

## 2019-11-24 DIAGNOSIS — G4733 Obstructive sleep apnea (adult) (pediatric): Secondary | ICD-10-CM | POA: Diagnosis not present

## 2019-11-24 DIAGNOSIS — R739 Hyperglycemia, unspecified: Secondary | ICD-10-CM | POA: Diagnosis not present

## 2019-11-24 DIAGNOSIS — I10 Essential (primary) hypertension: Secondary | ICD-10-CM | POA: Diagnosis not present

## 2019-11-24 DIAGNOSIS — E785 Hyperlipidemia, unspecified: Secondary | ICD-10-CM | POA: Diagnosis not present

## 2019-11-24 DIAGNOSIS — C50919 Malignant neoplasm of unspecified site of unspecified female breast: Secondary | ICD-10-CM | POA: Diagnosis not present

## 2019-11-24 DIAGNOSIS — C7802 Secondary malignant neoplasm of left lung: Secondary | ICD-10-CM | POA: Diagnosis not present

## 2019-11-24 DIAGNOSIS — Z0000 Encounter for general adult medical examination without abnormal findings: Secondary | ICD-10-CM | POA: Diagnosis not present

## 2019-11-24 DIAGNOSIS — F3342 Major depressive disorder, recurrent, in full remission: Secondary | ICD-10-CM | POA: Diagnosis not present

## 2019-11-24 DIAGNOSIS — M5136 Other intervertebral disc degeneration, lumbar region: Secondary | ICD-10-CM | POA: Diagnosis not present

## 2019-11-24 DIAGNOSIS — E034 Atrophy of thyroid (acquired): Secondary | ICD-10-CM | POA: Diagnosis not present

## 2019-11-24 DIAGNOSIS — I700 Atherosclerosis of aorta: Secondary | ICD-10-CM | POA: Diagnosis not present

## 2019-12-02 DIAGNOSIS — M5136 Other intervertebral disc degeneration, lumbar region: Secondary | ICD-10-CM | POA: Diagnosis not present

## 2019-12-02 DIAGNOSIS — M48062 Spinal stenosis, lumbar region with neurogenic claudication: Secondary | ICD-10-CM | POA: Diagnosis not present

## 2019-12-02 DIAGNOSIS — M5416 Radiculopathy, lumbar region: Secondary | ICD-10-CM | POA: Diagnosis not present

## 2019-12-06 IMAGING — CT NM PET TUM IMG INITIAL (PI) SKULL BASE T - THIGH
1 of 9 series · 1 of 25 positions shown · non-contrast
Comparison: CT chest 08/14/2017

CLINICAL DATA: Initial treatment strategy for left pleural nodules.

EXAM:
NUCLEAR MEDICINE PET SKULL BASE TO THIGH
TECHNIQUE: 7.74 mCi F-18 FDG was injected intravenously. Full-ring PET imaging
was performed from the skull base to thigh after the radiotracer. CT
data was obtained and used for attenuation correction and anatomic
localization.
Fasting blood glucose: 89 mg/dl

[Series 3: ct wb 5.0 b30f · axial · 5.0mm · 0.98mm/px · 1 of 285 slices shown]
[im 285/285  brain]
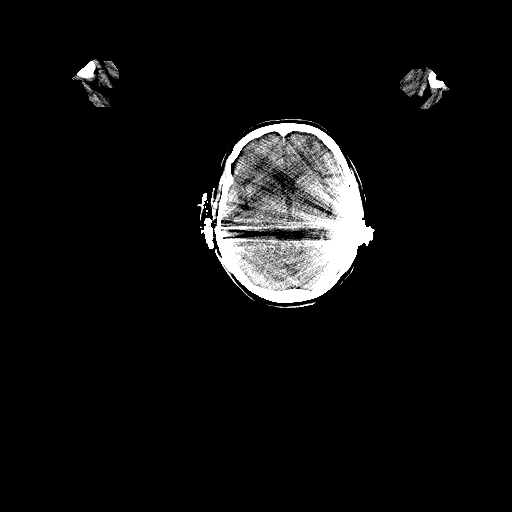

[1 of 25 positions shown; findings below may reference images not displayed]

FINDINGS: Mediastinal blood pool activity: SUV max

NECK: No hypermetabolic lymph nodes in the neck.

Incidental CT findings: none

CHEST: 9 mm precarinal lymph node on image number 84 is mildly
hypermetabolic with SUV max of [REDACTED] mm subcarinal lymph node is
hypermetabolic with SUV max of

Small left internal mammary lymph node on image number 84 is weakly
hypermetabolic with SUV max of 2.2.

Pleural nodularity at the left lung base is hypermetabolic with SUV
max of 3.83.

Small pleural nodules along the left anterior mediastinum are weakly
hypermetabolic.

8 mm epicardial lymph node on the left on image number 127 is weakly
hypermetabolic with SUV max of 1.97.

No enlarged or hypermetabolic supraclavicular or axillary nodes.

Incidental CT findings: No worrisome pulmonary nodules.
Moderate-sized hiatal hernia.

ABDOMEN/PELVIS: In the left upper quadrant there is a small soft
tissue lesion which may be partly in the retrocrural space. It
measures 15 mm on image number 144 and demonstrates hypermetabolism
with SUV max of 4.08.

No mesenteric or retroperitoneal lymphadenopathy. No abnormalities
involving the solid abdominal organs. No pelvic adenopathy or mass.

Incidental CT findings: Moderate diffuse bladder wall thickening and
bladder calculi noted. Small cystocele.

SKELETON: No focal hypermetabolic activity to suggest skeletal
metastasis.

Incidental CT findings: none
IMPRESSION: 1. Numerous small left-sided pleural nodules and small but
hypermetabolic mediastinal lymph nodes, worrisome for metastatic
breast cancer.
2. Small metastatic nodule in the left upper quadrant partly in the
retrocrural space.
3. No findings for pulmonary metastatic disease or osseous
metastatic disease.

## 2019-12-19 DIAGNOSIS — H43813 Vitreous degeneration, bilateral: Secondary | ICD-10-CM | POA: Diagnosis not present

## 2019-12-19 DIAGNOSIS — H353211 Exudative age-related macular degeneration, right eye, with active choroidal neovascularization: Secondary | ICD-10-CM | POA: Diagnosis not present

## 2019-12-25 IMAGING — DX DG CHEST 1V PORT
1 series · 1 of 1 positions shown · non-contrast
Comparison: 06/07/2017

CLINICAL DATA: Status post VATS

EXAM:
PORTABLE CHEST 1 VIEW

[chest ap]
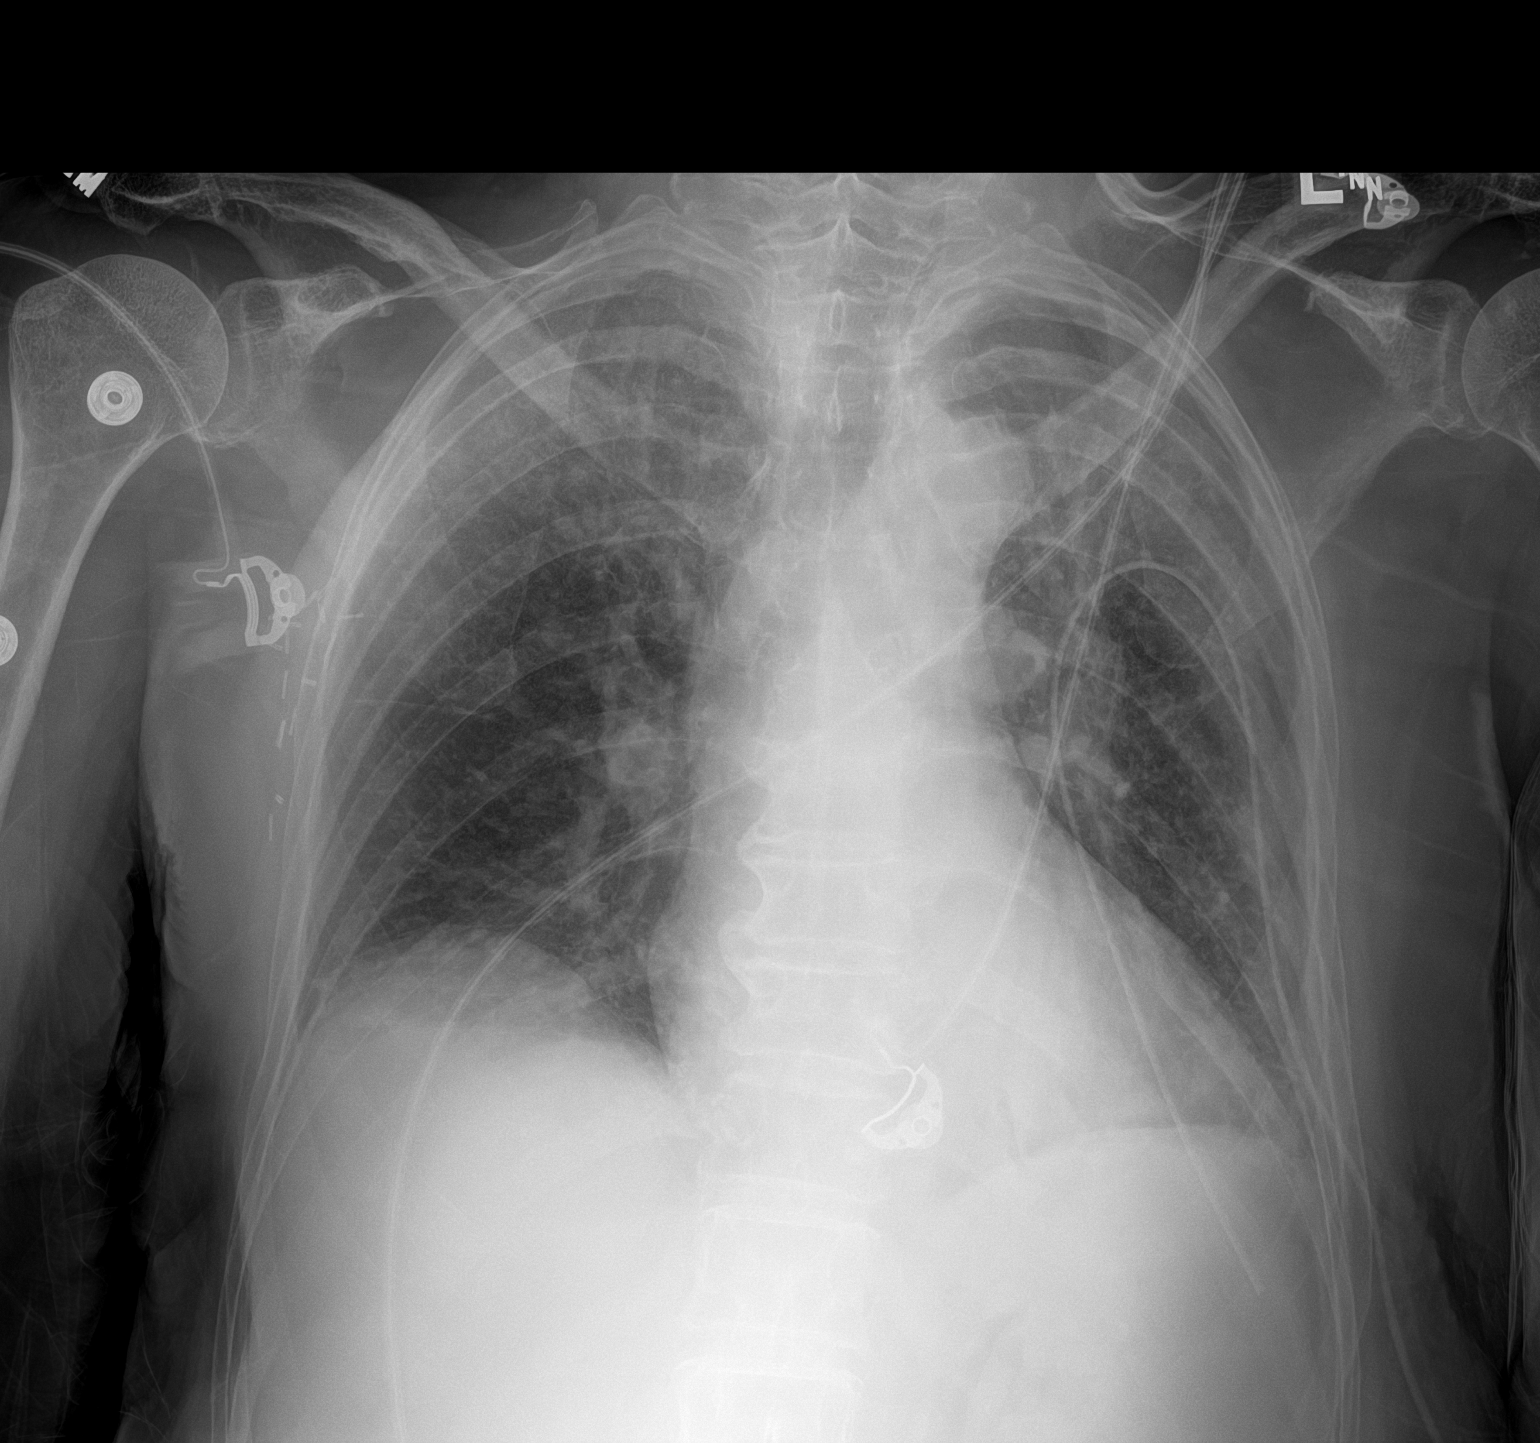

[1 of 1 positions shown; findings below may reference images not displayed]

FINDINGS: Cardiac shadow is stable. Left chest tube is now seen in
satisfactory position. No pneumothorax is seen. Minimal bibasilar
atelectasis is noted. No acute bony abnormality is seen.
IMPRESSION: No pneumothorax.  Left chest tube in satisfactory position.

## 2019-12-26 IMAGING — CR DG CHEST 2V
1 series · 2 of 2 positions shown · non-contrast
Comparison: 09/10/2017

CLINICAL DATA: Post operative check, chest tube, atrial
fibrillation, hypertension

EXAM:
CHEST - 2 VIEW

[Series 1: dg chest 2 view · 0.14mm/px · 2 of 2 slices shown]
[im 1/2]
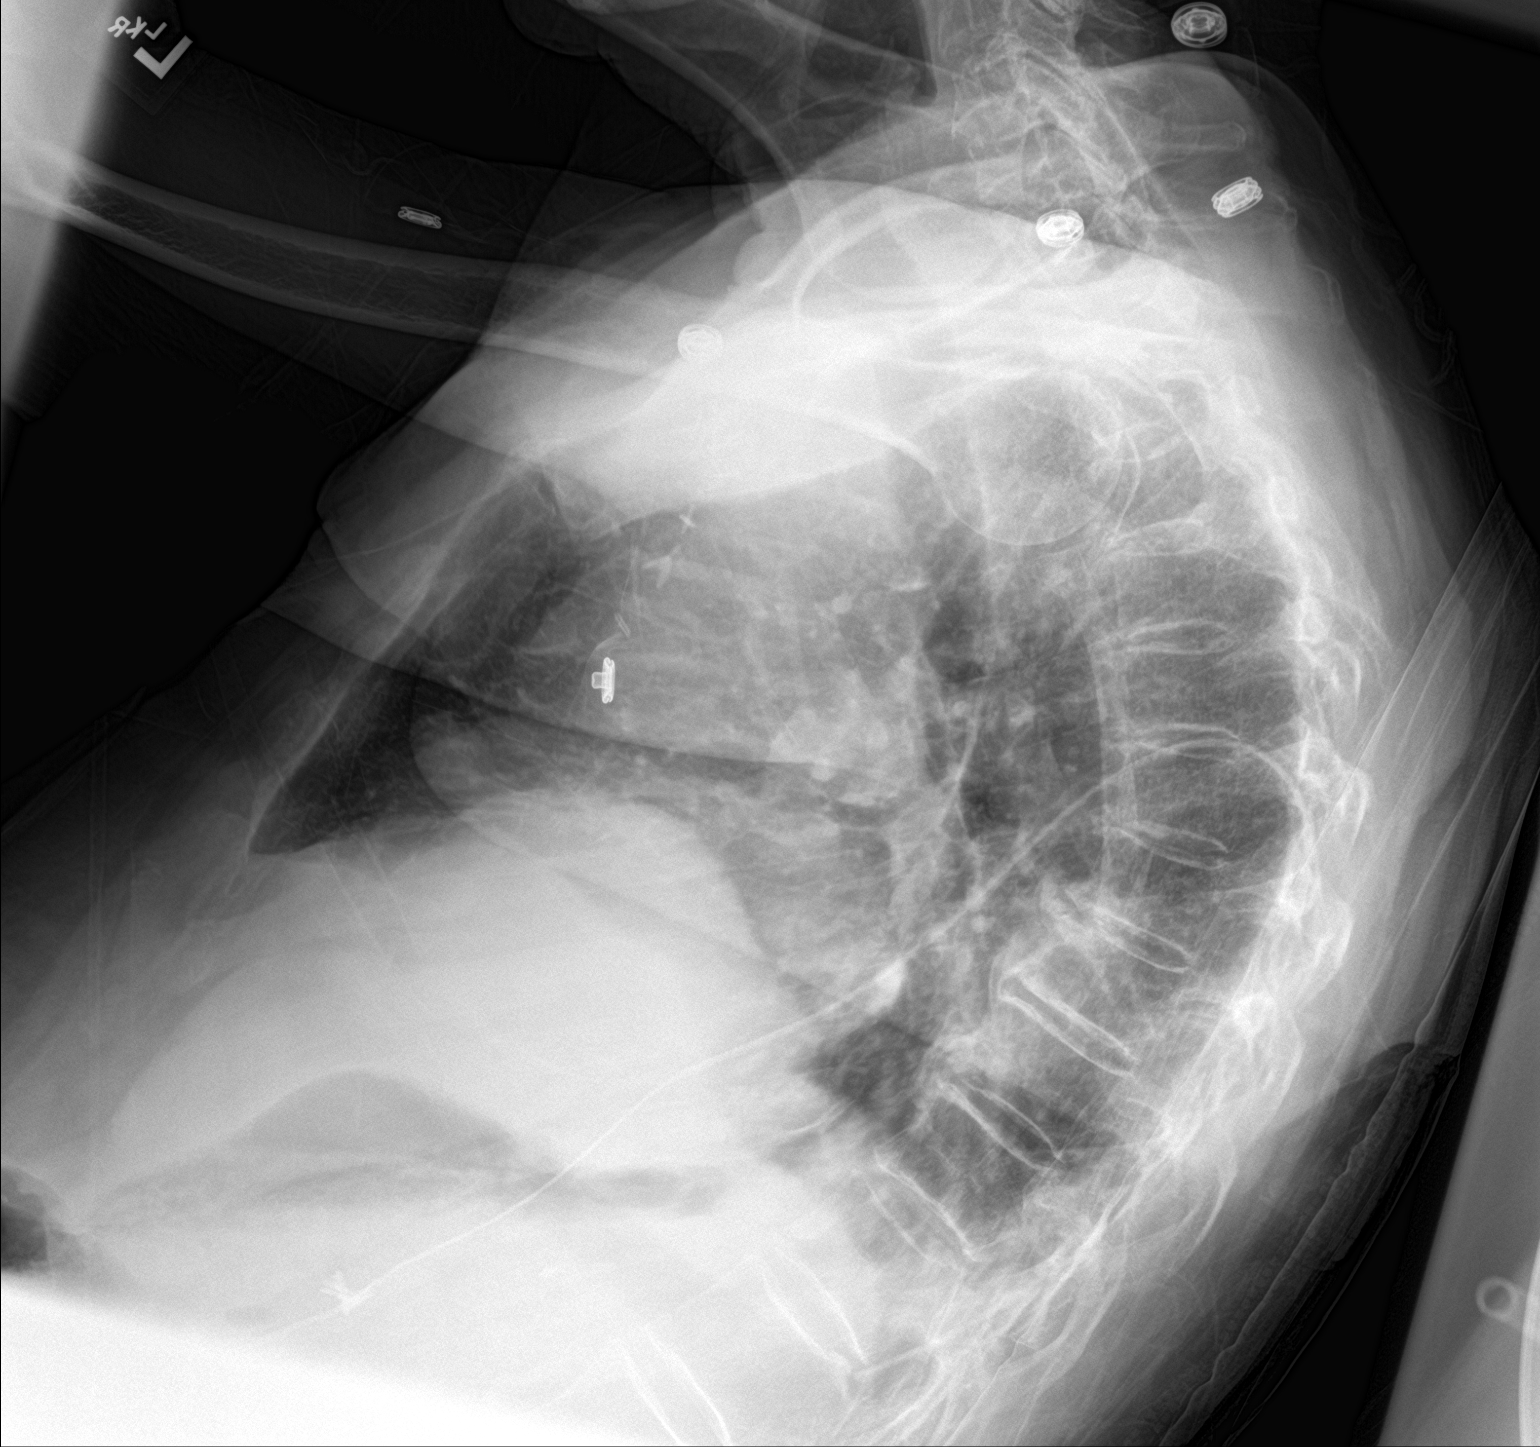
[im 2/2]
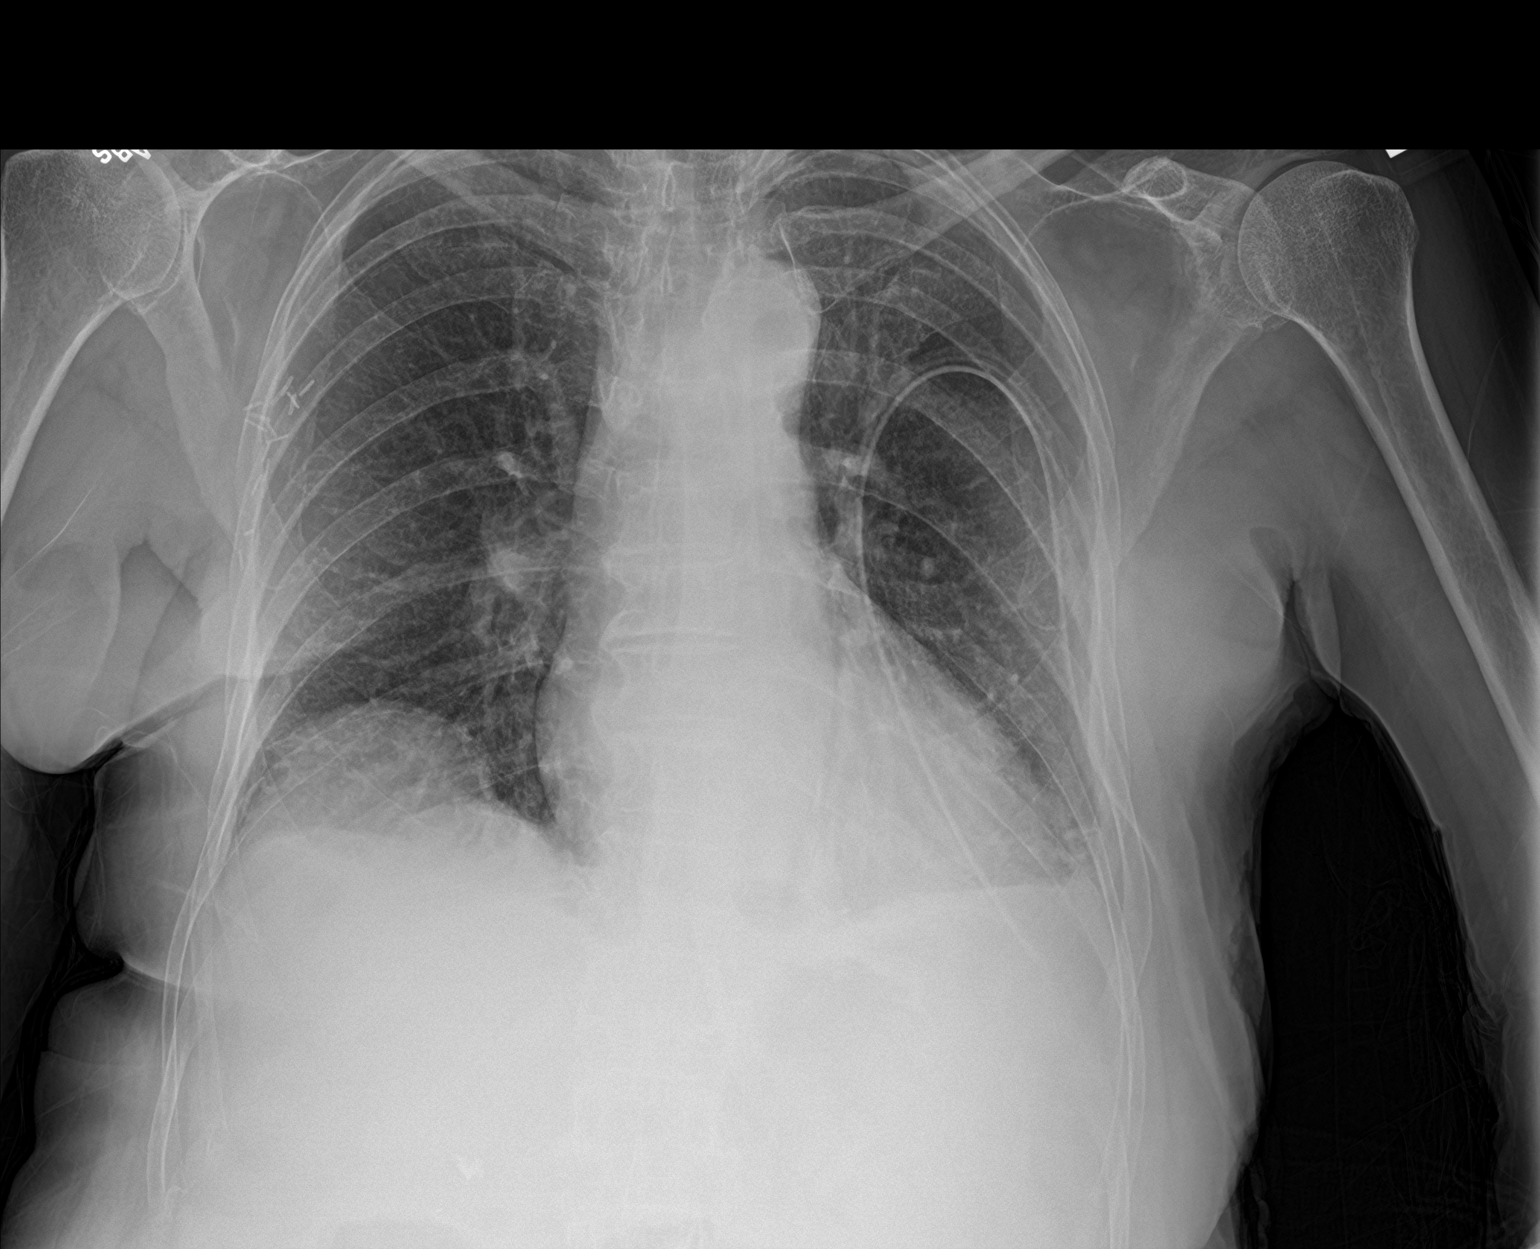

[2 of 2 positions shown; findings below may reference images not displayed]

FINDINGS: LEFT thoracostomy tube unchanged.

Upper normal heart size.

Mediastinal contours and pulmonary vascularity normal.

Eventration LEFT diaphragm.

Mild bibasilar atelectasis.

Tiny LEFT apex pneumothorax.

Remaining lungs clear.

Bones demineralized.

Lateral view degraded by respiratory motion artifacts.
IMPRESSION: Stable LEFT thoracostomy tube with tiny LEFT apex pneumothorax and
mild bibasilar atelectasis.

## 2020-01-01 DIAGNOSIS — H903 Sensorineural hearing loss, bilateral: Secondary | ICD-10-CM | POA: Diagnosis not present

## 2020-01-02 DIAGNOSIS — R0789 Other chest pain: Secondary | ICD-10-CM | POA: Diagnosis not present

## 2020-01-02 DIAGNOSIS — J209 Acute bronchitis, unspecified: Secondary | ICD-10-CM | POA: Diagnosis not present

## 2020-01-02 DIAGNOSIS — I10 Essential (primary) hypertension: Secondary | ICD-10-CM | POA: Diagnosis not present

## 2020-01-02 DIAGNOSIS — R079 Chest pain, unspecified: Secondary | ICD-10-CM | POA: Diagnosis not present

## 2020-01-08 DIAGNOSIS — M5416 Radiculopathy, lumbar region: Secondary | ICD-10-CM | POA: Diagnosis not present

## 2020-01-08 DIAGNOSIS — M5136 Other intervertebral disc degeneration, lumbar region: Secondary | ICD-10-CM | POA: Diagnosis not present

## 2020-01-08 DIAGNOSIS — M48062 Spinal stenosis, lumbar region with neurogenic claudication: Secondary | ICD-10-CM | POA: Diagnosis not present

## 2020-01-14 DIAGNOSIS — H353221 Exudative age-related macular degeneration, left eye, with active choroidal neovascularization: Secondary | ICD-10-CM | POA: Diagnosis not present

## 2020-01-16 ENCOUNTER — Encounter (HOSPITAL_COMMUNITY): Payer: Self-pay

## 2020-01-16 ENCOUNTER — Telehealth (HOSPITAL_COMMUNITY): Payer: Self-pay | Admitting: Psychiatry

## 2020-01-16 ENCOUNTER — Other Ambulatory Visit: Payer: Self-pay

## 2020-01-16 ENCOUNTER — Encounter (HOSPITAL_COMMUNITY): Payer: PPO | Admitting: Psychiatry

## 2020-01-16 NOTE — Telephone Encounter (Signed)
Pt was contacted several times for her scheduled appt on the numbers in her chart, however, there was no response.

## 2020-01-16 NOTE — Progress Notes (Signed)
error 

## 2020-02-13 DIAGNOSIS — H353211 Exudative age-related macular degeneration, right eye, with active choroidal neovascularization: Secondary | ICD-10-CM | POA: Diagnosis not present

## 2020-02-24 ENCOUNTER — Other Ambulatory Visit: Payer: Self-pay | Admitting: Oncology

## 2020-03-10 DIAGNOSIS — M5416 Radiculopathy, lumbar region: Secondary | ICD-10-CM | POA: Diagnosis not present

## 2020-03-10 DIAGNOSIS — M48062 Spinal stenosis, lumbar region with neurogenic claudication: Secondary | ICD-10-CM | POA: Diagnosis not present

## 2020-03-10 DIAGNOSIS — M7542 Impingement syndrome of left shoulder: Secondary | ICD-10-CM | POA: Diagnosis not present

## 2020-03-10 DIAGNOSIS — M5136 Other intervertebral disc degeneration, lumbar region: Secondary | ICD-10-CM | POA: Diagnosis not present

## 2020-03-18 ENCOUNTER — Other Ambulatory Visit (HOSPITAL_COMMUNITY): Payer: Self-pay | Admitting: Psychiatry

## 2020-03-18 DIAGNOSIS — F3342 Major depressive disorder, recurrent, in full remission: Secondary | ICD-10-CM

## 2020-03-24 ENCOUNTER — Ambulatory Visit: Payer: PPO

## 2020-03-24 ENCOUNTER — Ambulatory Visit
Admission: RE | Admit: 2020-03-24 | Discharge: 2020-03-24 | Disposition: A | Discharge status: Home / Self Care | Payer: PPO | Source: Ambulatory Visit | Attending: Oncology | Admitting: Oncology

## 2020-03-24 ENCOUNTER — Other Ambulatory Visit: Payer: Self-pay

## 2020-03-24 ENCOUNTER — Inpatient Hospital Stay: Payer: PPO | Attending: Oncology

## 2020-03-24 DIAGNOSIS — I700 Atherosclerosis of aorta: Secondary | ICD-10-CM | POA: Diagnosis not present

## 2020-03-24 DIAGNOSIS — Z9049 Acquired absence of other specified parts of digestive tract: Secondary | ICD-10-CM | POA: Insufficient documentation

## 2020-03-24 DIAGNOSIS — C50911 Malignant neoplasm of unspecified site of right female breast: Secondary | ICD-10-CM | POA: Diagnosis not present

## 2020-03-24 DIAGNOSIS — J8410 Pulmonary fibrosis, unspecified: Secondary | ICD-10-CM | POA: Diagnosis not present

## 2020-03-24 DIAGNOSIS — C782 Secondary malignant neoplasm of pleura: Secondary | ICD-10-CM | POA: Insufficient documentation

## 2020-03-24 DIAGNOSIS — K449 Diaphragmatic hernia without obstruction or gangrene: Secondary | ICD-10-CM | POA: Diagnosis not present

## 2020-03-24 DIAGNOSIS — C50919 Malignant neoplasm of unspecified site of unspecified female breast: Secondary | ICD-10-CM | POA: Diagnosis not present

## 2020-03-24 DIAGNOSIS — Z9011 Acquired absence of right breast and nipple: Secondary | ICD-10-CM | POA: Insufficient documentation

## 2020-03-24 DIAGNOSIS — Z170 Estrogen receptor positive status [ER+]: Secondary | ICD-10-CM | POA: Diagnosis not present

## 2020-03-24 DIAGNOSIS — M47814 Spondylosis without myelopathy or radiculopathy, thoracic region: Secondary | ICD-10-CM | POA: Diagnosis not present

## 2020-03-24 LAB — CBC WITH DIFFERENTIAL/PLATELET
Abs Immature Granulocytes: 0.03 10*3/uL (ref 0.00–0.07)
Basophils Absolute: 0.1 10*3/uL (ref 0.0–0.1)
Basophils Relative: 1 %
Eosinophils Absolute: 0.1 10*3/uL (ref 0.0–0.5)
Eosinophils Relative: 1 %
HCT: 37.7 % (ref 36.0–46.0)
Hemoglobin: 12.5 g/dL (ref 12.0–15.0)
Immature Granulocytes: 0 %
Lymphocytes Relative: 26 %
Lymphs Abs: 2.4 10*3/uL (ref 0.7–4.0)
MCH: 29.3 pg (ref 26.0–34.0)
MCHC: 33.2 g/dL (ref 30.0–36.0)
MCV: 88.5 fL (ref 80.0–100.0)
Monocytes Absolute: 0.8 10*3/uL (ref 0.1–1.0)
Monocytes Relative: 9 %
Neutro Abs: 5.7 10*3/uL (ref 1.7–7.7)
Neutrophils Relative %: 63 %
Platelets: 240 10*3/uL (ref 150–400)
RBC: 4.26 MIL/uL (ref 3.87–5.11)
RDW: 12.9 % (ref 11.5–15.5)
WBC: 9.1 10*3/uL (ref 4.0–10.5)
nRBC: 0 % (ref 0.0–0.2)

## 2020-03-24 LAB — COMPREHENSIVE METABOLIC PANEL
ALT: 18 U/L (ref 0–44)
AST: 24 U/L (ref 15–41)
Albumin: 3.9 g/dL (ref 3.5–5.0)
Alkaline Phosphatase: 66 U/L (ref 38–126)
Anion gap: 9 (ref 5–15)
BUN: 12 mg/dL (ref 8–23)
CO2: 25 mmol/L (ref 22–32)
Calcium: 8.5 mg/dL — ABNORMAL LOW (ref 8.9–10.3)
Chloride: 103 mmol/L (ref 98–111)
Creatinine, Ser: 0.79 mg/dL (ref 0.44–1.00)
GFR, Estimated: >60 mL/min (ref >60)
Glucose, Bld: 92 mg/dL (ref 70–99)
Potassium: 3.2 mmol/L — ABNORMAL LOW (ref 3.5–5.1)
Sodium: 137 mmol/L (ref 135–145)
Total Bilirubin: 0.8 mg/dL (ref 0.3–1.2)
Total Protein: 7 g/dL (ref 6.5–8.1)

## 2020-03-24 MED ORDER — IOHEXOL 300 MG/ML  SOLN
100.0000 mL | Freq: Once | INTRAMUSCULAR | Status: AC | PRN
  Administered 2020-03-24: 100 mL via INTRAVENOUS

## 2020-03-25 ENCOUNTER — Other Ambulatory Visit (HOSPITAL_COMMUNITY): Payer: Self-pay | Admitting: Psychiatry

## 2020-03-25 DIAGNOSIS — F3342 Major depressive disorder, recurrent, in full remission: Secondary | ICD-10-CM

## 2020-03-25 LAB — CANCER ANTIGEN 15-3: CA 15-3: 26 U/mL — ABNORMAL HIGH (ref 0.0–25.0)

## 2020-03-26 ENCOUNTER — Other Ambulatory Visit: Payer: Self-pay

## 2020-03-26 ENCOUNTER — Inpatient Hospital Stay (HOSPITAL_BASED_OUTPATIENT_CLINIC_OR_DEPARTMENT_OTHER): Payer: PPO | Admitting: Oncology

## 2020-03-26 ENCOUNTER — Encounter: Payer: Self-pay | Admitting: Oncology

## 2020-03-26 VITALS — BP 145/67 | HR 76 | Temp 98.3°F | Resp 20 | Wt 173.7 lb

## 2020-03-26 DIAGNOSIS — M5416 Radiculopathy, lumbar region: Secondary | ICD-10-CM | POA: Diagnosis not present

## 2020-03-26 DIAGNOSIS — C50919 Malignant neoplasm of unspecified site of unspecified female breast: Secondary | ICD-10-CM | POA: Diagnosis not present

## 2020-03-26 DIAGNOSIS — M5136 Other intervertebral disc degeneration, lumbar region: Secondary | ICD-10-CM | POA: Diagnosis not present

## 2020-03-26 DIAGNOSIS — C50911 Malignant neoplasm of unspecified site of right female breast: Secondary | ICD-10-CM | POA: Diagnosis not present

## 2020-03-26 DIAGNOSIS — Z79811 Long term (current) use of aromatase inhibitors: Secondary | ICD-10-CM

## 2020-03-26 DIAGNOSIS — M48062 Spinal stenosis, lumbar region with neurogenic claudication: Secondary | ICD-10-CM | POA: Diagnosis not present

## 2020-03-26 NOTE — Progress Notes (Signed)
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 She underwent mastectomy followed by adjuvant chemotherapy and 5 years of tamoxifen. Patient has been having some low back pain and underwent a CT lumbar spine without contrast on 08/10/2017 which was done by the pain clinic. CT mainly showed age-related degenerative disease but also showed incidental nodular thickening of the posterior left diaphragm concerning for tumor and a CT chest was recommended.  CT chest on 08/14/2017 showed scattered left pleural nodularity with tiny left pleural effusion and prominent left internal mammary and juxtadiaphragmatic lymph nodes which are nonspecific but metastatic disease could not be ruled out.  This was followed by a PET/CT scan on 08/22/2017 which showed numerous small left-sided pleural nodules which were mildly hypermetabolic along with hypermetabolic mediastinal lymph nodes concerning for metastatic disease. Small metastatic nodule in the left upper quadrant partly in the retrocrural space. No findings of pulmonary metastatic or osseous metastatic disease.  Patient underwent FNA of retrocrural LN which was positive for carcinoma but primary could not be ascertained. She then  underwent pleural biopsy which showed metastatic carcinoma consistent with breast primary. ER+HER-2. Patient started taking Ibrance in August 2019.  Ibrance on hold since January 2021 with stable disease on letrozole  NGS testing showedPIKCA and BRCA mutation   Interval history-patient has chronic back pain for which she receives intra-articular injections.  She is currently able to ambulate without a cane.  She is tolerating letrozole well without any significant side effects  ECOG PS- 1 Pain scale- 3 Opioid associated constipation- no  Review of systems- Review of Systems  Constitutional: Positive for malaise/fatigue. Negative for chills, fever and weight loss.  HENT: Negative for congestion, ear discharge and nosebleeds.   Eyes: Negative for blurred vision.  Respiratory: Negative for cough, hemoptysis, sputum production, shortness of breath and wheezing.   Cardiovascular: Negative for chest pain, palpitations, orthopnea and claudication.  Gastrointestinal: Negative for abdominal pain, blood in stool, constipation, diarrhea, heartburn, melena, nausea and vomiting.  Genitourinary: Negative for dysuria, flank pain, frequency, hematuria and urgency.  Musculoskeletal: Positive for back pain. Negative for joint pain and myalgias.  Skin: Negative for rash.  Neurological: Negative for dizziness, tingling, focal weakness, seizures, weakness and headaches.  Endo/Heme/Allergies: Does not bruise/bleed easily.  Psychiatric/Behavioral: Negative for depression and suicidal ideas. The patient does not have insomnia.       Allergies  Allergen Reactions  . Imipramine Tinitus  . Latex Rash  . Penicillins Rash  . Tape Rash     Past Medical History:  Diagnosis Date  . Anxiety   . Atrial fibrillation ([place])   . Breast cancer ([place])   . Breast cancer, right ([place]) 1999   RT MASTECTOMY and chemo tx's.   . DDD (degenerative disc disease), cervical    hands and back with arthritis  .  Depression   . Diverticulosis   . Diverticulosis   . DVT of lower extremity (deep venous thrombosis) ([place])   . Dyspnea   . Family history of colon cancer   . Family history of lung cancer   . Family history of throat cancer   . GERD (gastroesophageal reflux disease)   . Goiter   . Headache   . History of chemotherapy 2000   BREAST CA  . Hypertension   . Incontinence   . Neuromuscular disorder ([place])    peipheral neuropathy  . OSA (obstructive sleep apnea)   . Osteoporosis   . Personal history of chemotherapy 1999   BREAST CA  . Status post chemotherapy    2000 right breast cancer  . Thyroid disease   . UTI (urinary tract infection)   . Vertigo      Past Surgical History:  Procedure Laterality Date  . ABDOMINAL HYSTERECTOMY    . BREAST BIOPSY Left 2006   negative  . BUNIONECTOMY Bilateral    Screws implanted.  . CHOLECYSTECTOMY    . COCHLEAR IMPLANT Right   . COLONOSCOPY WITH PROPOFOL N/A 07/24/2016   Procedure: COLONOSCOPY WITH PROPOFOL;  Surgeon: Manya Silvas, MD;  Location: [facility] ENDOSCOPY;  Service: Endoscopy;  Laterality: N/A;  . ESOPHAGOGASTRODUODENOSCOPY (EGD) WITH PROPOFOL N/A 04/12/2016   Procedure: ESOPHAGOGASTRODUODENOSCOPY (EGD) WITH PROPOFOL;  Surgeon: Manya Silvas, MD;  Location: [facility] ENDOSCOPY;  Service: Endoscopy;  Laterality: N/A;  . EYE SURGERY Bilateral    cataract  . FOOT SURGERY Bilateral   . HAND SURGERY Right   . MASTECTOMY Right 1999   BREAST CA  . MASTECTOMY Right 1999  . PARTIAL COLECTOMY    . THYROIDECTOMY    . TOTAL HIP ARTHROPLASTY Left 03/19/2018   Procedure: TOTAL HIP ARTHROPLASTY LEFT;  Surgeon: Hessie Knows, MD;  Location: [facility] ORS;  Service: Orthopedics;  Laterality: Left;  Marland Kitchen VIDEO ASSISTED THORACOSCOPY Left 09/10/2017   Procedure: VIDEO ASSISTED THORACOSCOPY;  Surgeon: [name], MD;  Location: [facility] ORS;  Service: Thoracic;  Laterality: Left;  with biopsies  . VIDEO BRONCHOSCOPY  09/10/2017   Procedure: VIDEO BRONCHOSCOPY;   Surgeon: [name], MD;  Location: [facility] ORS;  Service: Thoracic;;    Social History   Socioeconomic History  . Marital status: Married    Spouse name: evert  . Number of children: 4  . Years of education: Not on file  . Highest education level: 9th grade  Occupational History  . Not on file  Tobacco Use  . Smoking status: Never Smoker  . Smokeless tobacco: Never Used  Vaping Use  . Vaping Use: Never used  Substance and Sexual Activity  . Alcohol use: No    Alcohol/week: 0.0 standard drinks  . Drug use: No  . Sexual activity: Not Currently  Other Topics Concern  . Not on file  Social History Narrative  . Not on file   Social Determinants of Health   Financial Resource Strain: Not on file  Food Insecurity: Not on file  Transportation Needs: Not on file  Physical Activity: Not on file  Stress: Not on file  Social Connections: Not on file  Intimate Partner Violence: Not on file    Family History  Problem Relation Age of Onset  . Dementia Mother   . Dementia Sister   . Diabetes Sister   . Heart attack Brother 59  . COPD Brother   . Lung cancer Brother        hx smoking  .  Colon cancer Brother   . Liver cancer Brother   . COPD Brother   . Lung cancer Brother        hx smoking  . Colon cancer Maternal Grandfather        dx >.50  . Breast cancer Neg Hx      Current Outpatient Medications:  .  acetaminophen (TYLENOL) 325 MG tablet, Take 650 mg by mouth every 4 (four) hours as needed. For pain / increased temp.  May be administered orally,  per G-Tube if needed or rectally if unable to swallow (separate order).  Maximum dose for 24 hours is 3,000 mg from all sources of Acetaminophen / Tylenol, Disp: , Rfl:  .  atorvastatin (LIPITOR) 40 MG tablet, Take 1 tablet (40 mg total) by mouth at bedtime., Disp: 30 tablet, Rfl: 0 .  busPIRone (BUSPAR) 10 MG tablet, TAKE 1 TABLET BY MOUTH TWICE A DAY, Disp: 180 tablet, Rfl: 0 .  cholecalciferol (VITAMIN D) 1000 units  tablet, Take 1,000 Units by mouth daily., Disp: , Rfl:  .  cyanocobalamin (V-R VITAMIN B-12) 500 MCG tablet, Take 500 mcg by mouth daily. , Disp: , Rfl:  .  DULoxetine (CYMBALTA) 60 MG capsule, Take 1 capsule (60 mg total) by mouth every morning., Disp: 90 capsule, Rfl: 0 .  gabapentin (NEURONTIN) 300 MG capsule, Take 300 mg by mouth 2 (two) times daily., Disp: , Rfl:  .  letrozole (FEMARA) 2.5 MG tablet, TAKE 1 TABLET BY MOUTH EVERY DAY, Disp: 90 tablet, Rfl: 1 .  levothyroxine (SYNTHROID) 100 MCG tablet, Take 1 tablet by mouth daily., Disp: , Rfl:  .  mirabegron ER (MYRBETRIQ) 50 MG TB24 tablet, Take 1 tablet (50 mg total) by mouth daily., Disp: 90 tablet, Rfl: 3 .  ondansetron (ZOFRAN) 4 MG tablet, Take 1 tablet (4 mg total) by mouth every 6 (six) hours as needed for nausea or vomiting., Disp: 21 tablet, Rfl: 1 .  pantoprazole (PROTONIX) 40 MG tablet, Take 1 tablet (40 mg total) by mouth 2 (two) times daily., Disp: 60 tablet, Rfl: 0 .  traZODone (DESYREL) 100 MG tablet, Take 1 tablet (100 mg total) by mouth at bedtime., Disp: 90 tablet, Rfl: 0  Physical exam:  Vitals:   03/26/20 1115  BP: (!) 145/67  Pulse: 76  Resp: 20  Temp: 98.3 F (36.8 C)  TempSrc: Tympanic  SpO2: 100%  Weight: 173 lb 11.2 oz (78.8 kg)   Physical Exam Constitutional:      General: She is not in acute distress. Eyes:     Extraocular Movements: EOM normal.  Cardiovascular:     Rate and Rhythm: Normal rate and regular rhythm.     Heart sounds: Normal heart sounds.  Pulmonary:     Effort: Pulmonary effort is normal.     Breath sounds: Normal breath sounds.  Abdominal:     General: Bowel sounds are normal.     Palpations: Abdomen is soft.  Skin:    General: Skin is warm and dry.  Neurological:     Mental Status: She is alert and oriented to person, place, and time.      CMP Latest Ref Rng & Units 03/24/2020  Glucose 70 - 99 mg/dL 92  BUN 8 - 23 mg/dL 12  Creatinine 0.44 - 1.00 mg/dL 0.79  Sodium 135  - 145 mmol/L 137  Potassium 3.5 - 5.1 mmol/L 3.2(L)  Chloride 98 - 111 mmol/L 103  CO2 22 - 32 mmol/L 25  Calcium 8.9 -  10.3 mg/dL 8.5(L)  Total Protein 6.5 - 8.1 g/dL 7.0  Total Bilirubin 0.3 - 1.2 mg/dL 0.8  Alkaline Phos 38 - 126 U/L 66  AST 15 - 41 U/L 24  ALT 0 - 44 U/L 18   CBC Latest Ref Rng & Units 03/24/2020  WBC 4.0 - 10.5 K/uL 9.1  Hemoglobin 12.0 - 15.0 g/dL 12.5  Hematocrit 36.0 - 46.0 % 37.7  Platelets 150 - 400 K/uL 240    No images are attached to the encounter.  CT Chest W Contrast  Result Date: 03/24/2020 CLINICAL DATA:  Restaging metastatic breast cancer. EXAM: CT CHEST, ABDOMEN, AND PELVIS WITH CONTRAST TECHNIQUE: Multidetector CT imaging of the chest, abdomen and pelvis was performed following the standard protocol during bolus administration of intravenous contrast. CONTRAST:  152m OMNIPAQUE IOHEXOL 300 MG/ML  SOLN COMPARISON:  Multiple prior examinations. The most recent is 09/29/2019. FINDINGS: CT CHEST FINDINGS Cardiovascular: The heart is normal in size. No pericardial effusion. Stable scattered atherosclerotic calcifications involving the thoracic aorta but no aneurysm or dissection. Mediastinum/Nodes: No mediastinal or hilar mass or lymphadenopathy. Stable moderate to large hiatal hernia. Lungs/Pleura: No worrisome pulmonary nodules to suggest pulmonary metastatic disease. A few small scattered calcified granulomas are noted. No acute pulmonary findings. No pleural effusions or pleural lesions. Musculoskeletal: Stable surgical changes from a right mastectomy. No findings to suggest recurrent chest wall tumor. No supraclavicular or axillary lymphadenopathy. The bony thorax intact and appears stable. Moderate to advanced degenerative changes involving the thoracic spine. No lytic or sclerotic bone lesions are identified. CT ABDOMEN PELVIS FINDINGS Hepatobiliary: No hepatic lesions to suggest metastatic disease. The gallbladder is surgically absent. No intra or  extrahepatic biliary dilatation. Pancreas: No mass, inflammation or ductal dilatation. Spleen: Normal size.  No focal lesions. Adrenals/Urinary Tract: The adrenal glands and kidneys are unremarkable and stable. The bladder is unremarkable. Stable periurethral curvilinear density likely postsurgical. Periurethral diverticulum with contrast is felt to be unlikely. Stomach/Bowel: The stomach, duodenum, small bowel and colon are unremarkable. Stable surgical changes involving the right colon. Vascular/Lymphatic: Stable atherosclerotic calcifications involving the aorta and branch vessels but no aneurysm or dissection. The major venous structures are patent. No mesenteric or retroperitoneal mass or adenopathy. Reproductive: Surgically absent. Other: No pelvic mass or adenopathy. No free pelvic fluid collections. No inguinal mass or adenopathy. No abdominal wall hernia or subcutaneous lesions. Musculoskeletal: A total left hip arthroplasty is noted. No complicating features. No worrisome bone lesions. IMPRESSION: 1. Stable surgical changes from a right mastectomy. No findings to suggest recurrent chest wall tumor or axillary adenopathy. 2. No findings for metastatic disease involving the chest, abdomen or pelvis. 3. Stable moderate to large hiatal hernia. 4. Status post cholecystectomy. No biliary dilatation. Aortic Atherosclerosis (ICD10-I70.0). Electronically Signed   By: PMarijo SanesM.D.   On: 03/24/2020 12:07   CT Abdomen Pelvis W Contrast  Result Date: 03/24/2020 CLINICAL DATA:  Restaging metastatic breast cancer. EXAM: CT CHEST, ABDOMEN, AND PELVIS WITH CONTRAST TECHNIQUE: Multidetector CT imaging of the chest, abdomen and pelvis was performed following the standard protocol during bolus administration of intravenous contrast. CONTRAST:  1036mOMNIPAQUE IOHEXOL 300 MG/ML  SOLN COMPARISON:  Multiple prior examinations. The most recent is 09/29/2019. FINDINGS: CT CHEST FINDINGS Cardiovascular: The heart is  normal in size. No pericardial effusion. Stable scattered atherosclerotic calcifications involving the thoracic aorta but no aneurysm or dissection. Mediastinum/Nodes: No mediastinal or hilar mass or lymphadenopathy. Stable moderate to large hiatal hernia. Lungs/Pleura: No worrisome pulmonary nodules to suggest  pulmonary metastatic disease. A few small scattered calcified granulomas are noted. No acute pulmonary findings. No pleural effusions or pleural lesions. Musculoskeletal: Stable surgical changes from a right mastectomy. No findings to suggest recurrent chest wall tumor. No supraclavicular or axillary lymphadenopathy. The bony thorax intact and appears stable. Moderate to advanced degenerative changes involving the thoracic spine. No lytic or sclerotic bone lesions are identified. CT ABDOMEN PELVIS FINDINGS Hepatobiliary: No hepatic lesions to suggest metastatic disease. The gallbladder is surgically absent. No intra or extrahepatic biliary dilatation. Pancreas: No mass, inflammation or ductal dilatation. Spleen: Normal size.  No focal lesions. Adrenals/Urinary Tract: The adrenal glands and kidneys are unremarkable and stable. The bladder is unremarkable. Stable periurethral curvilinear density likely postsurgical. Periurethral diverticulum with contrast is felt to be unlikely. Stomach/Bowel: The stomach, duodenum, small bowel and colon are unremarkable. Stable surgical changes involving the right colon. Vascular/Lymphatic: Stable atherosclerotic calcifications involving the aorta and branch vessels but no aneurysm or dissection. The major venous structures are patent. No mesenteric or retroperitoneal mass or adenopathy. Reproductive: Surgically absent. Other: No pelvic mass or adenopathy. No free pelvic fluid collections. No inguinal mass or adenopathy. No abdominal wall hernia or subcutaneous lesions. Musculoskeletal: A total left hip arthroplasty is noted. No complicating features. No worrisome bone  lesions. IMPRESSION: 1. Stable surgical changes from a right mastectomy. No findings to suggest recurrent chest wall tumor or axillary adenopathy. 2. No findings for metastatic disease involving the chest, abdomen or pelvis. 3. Stable moderate to large hiatal hernia. 4. Status post cholecystectomy. No biliary dilatation. Aortic Atherosclerosis (ICD10-I70.0). Electronically Signed   By: Marijo Sanes M.D.   On: 03/24/2020 12:07     Assessment and plan- Patient is a 80 y.o. female with metastatic ER positive breast cancer on letrozole here for routine follow-up  Discussed the results of CT chest abdomen and pelvis with contrast which did not show any evidence of progressive or recurrent disease.  Tumor markers remain stable.  She is to continue letrozole single agent and I will consider reinitiating Leslee Home if there is any concern for progression in the future.  I will see her back in 6 months with CBC with differential CMP and CA 15-3.  She will also get scans including CT chest abdomen and pelvis with contrast and bone scan as well as a bone density scan at that time   Visit Diagnosis 1. Metastatic breast cancer [facility])      Dr. Randa Evens, MD, MPH [facility] [phone] 03/26/2020 1:51 PM

## 2020-04-01 ENCOUNTER — Other Ambulatory Visit: Payer: Self-pay | Admitting: Oncology

## 2020-04-09 DIAGNOSIS — H353211 Exudative age-related macular degeneration, right eye, with active choroidal neovascularization: Secondary | ICD-10-CM | POA: Diagnosis not present

## 2020-04-12 ENCOUNTER — Other Ambulatory Visit: Payer: Self-pay | Admitting: Urology

## 2020-04-12 ENCOUNTER — Other Ambulatory Visit (HOSPITAL_COMMUNITY): Payer: Self-pay | Admitting: Psychiatry

## 2020-04-12 DIAGNOSIS — N390 Urinary tract infection, site not specified: Secondary | ICD-10-CM

## 2020-04-12 DIAGNOSIS — F3342 Major depressive disorder, recurrent, in full remission: Secondary | ICD-10-CM

## 2020-04-14 DIAGNOSIS — H353221 Exudative age-related macular degeneration, left eye, with active choroidal neovascularization: Secondary | ICD-10-CM | POA: Diagnosis not present

## 2020-04-15 DIAGNOSIS — M7542 Impingement syndrome of left shoulder: Secondary | ICD-10-CM | POA: Diagnosis not present

## 2020-04-15 DIAGNOSIS — M48062 Spinal stenosis, lumbar region with neurogenic claudication: Secondary | ICD-10-CM | POA: Diagnosis not present

## 2020-04-15 DIAGNOSIS — M5416 Radiculopathy, lumbar region: Secondary | ICD-10-CM | POA: Diagnosis not present

## 2020-04-15 DIAGNOSIS — M5136 Other intervertebral disc degeneration, lumbar region: Secondary | ICD-10-CM | POA: Diagnosis not present

## 2020-05-11 DIAGNOSIS — M48062 Spinal stenosis, lumbar region with neurogenic claudication: Secondary | ICD-10-CM | POA: Diagnosis not present

## 2020-05-11 DIAGNOSIS — M545 Low back pain, unspecified: Secondary | ICD-10-CM | POA: Diagnosis not present

## 2020-05-11 DIAGNOSIS — M5136 Other intervertebral disc degeneration, lumbar region: Secondary | ICD-10-CM | POA: Diagnosis not present

## 2020-05-11 DIAGNOSIS — S32030A Wedge compression fracture of third lumbar vertebra, initial encounter for closed fracture: Secondary | ICD-10-CM | POA: Diagnosis not present

## 2020-05-11 DIAGNOSIS — M5416 Radiculopathy, lumbar region: Secondary | ICD-10-CM | POA: Diagnosis not present

## 2020-05-12 ENCOUNTER — Ambulatory Visit
Admission: RE | Admit: 2020-05-12 | Discharge: 2020-05-12 | Disposition: A | Discharge status: Home / Self Care | Payer: PPO | Source: Ambulatory Visit | Attending: Physical Medicine and Rehabilitation | Admitting: Physical Medicine and Rehabilitation

## 2020-05-12 ENCOUNTER — Other Ambulatory Visit: Payer: Self-pay

## 2020-05-12 ENCOUNTER — Other Ambulatory Visit: Payer: Self-pay | Admitting: Physical Medicine and Rehabilitation

## 2020-05-12 DIAGNOSIS — M545 Low back pain, unspecified: Secondary | ICD-10-CM | POA: Diagnosis not present

## 2020-05-12 DIAGNOSIS — M5416 Radiculopathy, lumbar region: Secondary | ICD-10-CM | POA: Insufficient documentation

## 2020-05-13 ENCOUNTER — Other Ambulatory Visit: Payer: Self-pay | Admitting: Orthopedic Surgery

## 2020-05-13 DIAGNOSIS — S32030A Wedge compression fracture of third lumbar vertebra, initial encounter for closed fracture: Secondary | ICD-10-CM | POA: Diagnosis not present

## 2020-05-17 DIAGNOSIS — E785 Hyperlipidemia, unspecified: Secondary | ICD-10-CM | POA: Diagnosis not present

## 2020-05-17 DIAGNOSIS — M5136 Other intervertebral disc degeneration, lumbar region: Secondary | ICD-10-CM | POA: Diagnosis not present

## 2020-05-17 DIAGNOSIS — C50919 Malignant neoplasm of unspecified site of unspecified female breast: Secondary | ICD-10-CM | POA: Diagnosis not present

## 2020-05-17 DIAGNOSIS — C7802 Secondary malignant neoplasm of left lung: Secondary | ICD-10-CM | POA: Diagnosis not present

## 2020-05-17 DIAGNOSIS — G4733 Obstructive sleep apnea (adult) (pediatric): Secondary | ICD-10-CM | POA: Diagnosis not present

## 2020-05-17 DIAGNOSIS — E034 Atrophy of thyroid (acquired): Secondary | ICD-10-CM | POA: Diagnosis not present

## 2020-05-17 DIAGNOSIS — R739 Hyperglycemia, unspecified: Secondary | ICD-10-CM | POA: Diagnosis not present

## 2020-05-17 DIAGNOSIS — I10 Essential (primary) hypertension: Secondary | ICD-10-CM | POA: Diagnosis not present

## 2020-05-18 ENCOUNTER — Other Ambulatory Visit
Admission: RE | Admit: 2020-05-18 | Discharge: 2020-05-18 | Disposition: A | Discharge status: Home / Self Care | Payer: PPO | Source: Ambulatory Visit | Attending: Orthopedic Surgery | Admitting: Orthopedic Surgery

## 2020-05-18 ENCOUNTER — Other Ambulatory Visit: Payer: Self-pay

## 2020-05-18 DIAGNOSIS — Z01812 Encounter for preprocedural laboratory examination: Secondary | ICD-10-CM | POA: Diagnosis not present

## 2020-05-18 DIAGNOSIS — Z20822 Contact with and (suspected) exposure to covid-19: Secondary | ICD-10-CM | POA: Insufficient documentation

## 2020-05-18 NOTE — Patient Instructions (Addendum)
Your procedure is scheduled on: 05/20/20- Thursday Report to the Registration Desk on the 1st floor of the [facility]. To find out your arrival time, please call [phone] between 1PM - 3PM on: 05/19/20- Wednesday  REMEMBER: Instructions that are not followed completely may result in serious medical risk, up to and including death; or upon the discretion of your surgeon and anesthesiologist your surgery may need to be rescheduled.  Do not eat food after midnight the night before surgery.  No gum chewing, lozengers or hard candies.  You may however, drink CLEAR liquids up to 2 hours before you are scheduled to arrive for your surgery. Do not drink anything within 2 hours of your scheduled arrival time.  Clear liquids include: - water  - apple juice without pulp - gatorade (not RED, PURPLE, OR BLUE) - black coffee or tea (Do NOT add milk or creamers to the coffee or tea) Do NOT drink anything that is not on this list.  In addition, your doctor has ordered for you to drink the provided  Ensure Pre-Surgery Clear Carbohydrate Drink  Drinking this carbohydrate drink up to two hours before surgery helps to reduce insulin resistance and improve patient outcomes. Please complete drinking 2 hours prior to scheduled arrival time.  TAKE THESE MEDICATIONS THE MORNING OF SURGERY WITH A SIP OF WATER:  - mirabegron ER (MYRBETRIQ) 50 MG TB24 tablet - busPIRone (BUSPAR) 10 MG tablet - DULoxetine (CYMBALTA) 60 MG capsule - gabapentin (NEURONTIN) 300 MG capsule - levothyroxine (SYNTHROID) 100 MCG tablet - letrozole (FEMARA) 2.5 MG tablet - pantoprazole (PROTONIX) 40 MG tablet, (take one the night before and one on the morning of surgery - helps to prevent nausea after surgery.)   One week prior to surgery: Stop Anti-inflammatories (NSAIDS) such as Advil, Aleve, Ibuprofen, Motrin, Naproxen, Naprosyn and Aspirin based products such as Excedrin, Goodys Powder, BC Powder.  Stop ANY OVER THE  COUNTER supplements until after surgery.  No Alcohol for 24 hours before or after surgery.  No Smoking including e-cigarettes for 24 hours prior to surgery.  No chewable tobacco products for at least 6 hours prior to surgery.  No nicotine patches on the day of surgery.  Do not use any "recreational" drugs for at least a week prior to your surgery.  Please be advised that the combination of cocaine and anesthesia may have negative outcomes, up to and including death. If you test positive for cocaine, your surgery will be cancelled.  On the morning of surgery brush your teeth with toothpaste and water, you may rinse your mouth with mouthwash if you wish. Do not swallow any toothpaste or mouthwash.  Do not wear jewelry, make-up, hairpins, clips or nail polish.  Do not wear lotions, powders, or perfumes.   Do not shave body from the neck down 48 hours prior to surgery just in case you cut yourself which could leave a site for infection.  Also, freshly shaved skin may become irritated if using the CHG soap.  Contact lenses, hearing aids and dentures may not be worn into surgery.  Do not bring valuables to the hospital. [facility] is not responsible for any missing/lost belongings or valuables.   Use CHG Soap or wipes as directed on instruction sheet.  Bring your C-PAP to the hospital with you in case you may have to spend the night.   Notify your doctor if there is any change in your medical condition (cold, fever, infection).  Wear comfortable clothing (specific to your  surgery type) to the hospital.  Plan for stool softeners for home use; pain medications have a tendency to cause constipation. You can also help prevent constipation by eating foods high in fiber such as fruits and vegetables and drinking plenty of fluids as your diet allows.  After surgery, you can help prevent lung complications by doing breathing exercises.  Take deep breaths and cough every 1-2 hours. Your  doctor may order a device called an Incentive Spirometer to help you take deep breaths. When coughing or sneezing, hold a pillow firmly against your incision with both hands. This is called "splinting." Doing this helps protect your incision. It also decreases belly discomfort.  If you are being admitted to the hospital overnight, leave your suitcase in the car. After surgery it may be brought to your room.  If you are being discharged the day of surgery, you will not be allowed to drive home. You will need a responsible adult (18 years or older) to drive you home and stay with you that night.   If you are taking public transportation, you will need to have a responsible adult (18 years or older) with you. Please confirm with your physician that it is acceptable to use public transportation.   Please call the [place] Dept. at [phone] if you have any questions about these instructions.  Surgery Visitation Policy:  Patients undergoing a surgery or procedure may have one family member or support person with them as long as that person is not COVID-19 positive or experiencing its symptoms.  That person may remain in the waiting area during the procedure.  Inpatient Visitation:    Visiting hours are 7 a.m. to 8 p.m. Inpatients will be allowed two visitors daily. The visitors may change each day during the patient's stay. No visitors under the age of 54. Any visitor under the age of 44 must be accompanied by an adult. The visitor must pass COVID-19 screenings, use hand sanitizer when entering and exiting the patient's room and wear a mask at all times, including in the patient's room. Patients must also wear a mask when staff or their visitor are in the room. Masking is required regardless of vaccination status.

## 2020-05-19 ENCOUNTER — Other Ambulatory Visit
Admission: RE | Admit: 2020-05-19 | Discharge: 2020-05-19 | Disposition: A | Discharge status: Home / Self Care | Payer: PPO | Source: Ambulatory Visit | Attending: Orthopedic Surgery | Admitting: Orthopedic Surgery

## 2020-05-19 DIAGNOSIS — Z01812 Encounter for preprocedural laboratory examination: Secondary | ICD-10-CM | POA: Diagnosis not present

## 2020-05-19 LAB — SARS CORONAVIRUS 2 (TAT 6-24 HRS): SARS Coronavirus 2: NEGATIVE

## 2020-05-20 ENCOUNTER — Encounter
Admission: RE | Disposition: A | Discharge status: Home / Self Care | Payer: Self-pay | Source: Home / Self Care | Attending: Orthopedic Surgery

## 2020-05-20 ENCOUNTER — Ambulatory Visit: Payer: PPO | Admitting: Certified Registered"

## 2020-05-20 ENCOUNTER — Other Ambulatory Visit: Payer: Self-pay

## 2020-05-20 ENCOUNTER — Encounter: Payer: Self-pay | Admitting: Orthopedic Surgery

## 2020-05-20 ENCOUNTER — Ambulatory Visit: Payer: PPO

## 2020-05-20 ENCOUNTER — Ambulatory Visit
Admission: RE | Admit: 2020-05-20 | Discharge: 2020-05-20 | Disposition: A | Discharge status: Home / Self Care | Payer: PPO | Attending: Orthopedic Surgery | Admitting: Orthopedic Surgery

## 2020-05-20 DIAGNOSIS — Z833 Family history of diabetes mellitus: Secondary | ICD-10-CM | POA: Diagnosis not present

## 2020-05-20 DIAGNOSIS — I10 Essential (primary) hypertension: Secondary | ICD-10-CM | POA: Diagnosis not present

## 2020-05-20 DIAGNOSIS — K219 Gastro-esophageal reflux disease without esophagitis: Secondary | ICD-10-CM | POA: Diagnosis not present

## 2020-05-20 DIAGNOSIS — Z91040 Latex allergy status: Secondary | ICD-10-CM | POA: Insufficient documentation

## 2020-05-20 DIAGNOSIS — Z79890 Hormone replacement therapy (postmenopausal): Secondary | ICD-10-CM | POA: Diagnosis not present

## 2020-05-20 DIAGNOSIS — Z419 Encounter for procedure for purposes other than remedying health state, unspecified: Secondary | ICD-10-CM

## 2020-05-20 DIAGNOSIS — Z79899 Other long term (current) drug therapy: Secondary | ICD-10-CM | POA: Insufficient documentation

## 2020-05-20 DIAGNOSIS — Z853 Personal history of malignant neoplasm of breast: Secondary | ICD-10-CM | POA: Insufficient documentation

## 2020-05-20 DIAGNOSIS — S32030A Wedge compression fracture of third lumbar vertebra, initial encounter for closed fracture: Secondary | ICD-10-CM | POA: Insufficient documentation

## 2020-05-20 DIAGNOSIS — Z801 Family history of malignant neoplasm of trachea, bronchus and lung: Secondary | ICD-10-CM | POA: Insufficient documentation

## 2020-05-20 DIAGNOSIS — M4856XA Collapsed vertebra, not elsewhere classified, lumbar region, initial encounter for fracture: Secondary | ICD-10-CM | POA: Diagnosis not present

## 2020-05-20 DIAGNOSIS — Z880 Allergy status to penicillin: Secondary | ICD-10-CM | POA: Insufficient documentation

## 2020-05-20 DIAGNOSIS — E785 Hyperlipidemia, unspecified: Secondary | ICD-10-CM | POA: Diagnosis not present

## 2020-05-20 DIAGNOSIS — Z809 Family history of malignant neoplasm, unspecified: Secondary | ICD-10-CM | POA: Diagnosis not present

## 2020-05-20 DIAGNOSIS — G4733 Obstructive sleep apnea (adult) (pediatric): Secondary | ICD-10-CM | POA: Diagnosis not present

## 2020-05-20 DIAGNOSIS — S32009A Unspecified fracture of unspecified lumbar vertebra, initial encounter for closed fracture: Secondary | ICD-10-CM | POA: Diagnosis not present

## 2020-05-20 DIAGNOSIS — Z800 Family history of malignant neoplasm of digestive organs: Secondary | ICD-10-CM | POA: Diagnosis not present

## 2020-05-20 DIAGNOSIS — X58XXXA Exposure to other specified factors, initial encounter: Secondary | ICD-10-CM | POA: Diagnosis not present

## 2020-05-20 HISTORY — PX: KYPHOPLASTY: SHX5884

## 2020-05-20 SURGERY — KYPHOPLASTY
Anesthesia: Monitor Anesthesia Care

## 2020-05-20 MED ORDER — ONDANSETRON HCL 4 MG/2ML IJ SOLN: 4.0000 mg | Freq: Once | INTRAMUSCULAR | Status: DC | PRN

## 2020-05-20 MED ORDER — METOCLOPRAMIDE HCL 5 MG/ML IJ SOLN
5.0000 mg | Freq: Three times a day (TID) | INTRAMUSCULAR | Status: DC | PRN
Start: 2020-05-20 — End: 2020-05-20

## 2020-05-20 MED ORDER — OXYCODONE-ACETAMINOPHEN 5-325 MG PO TABS: 1.0000 | ORAL_TABLET | ORAL | 0 refills | Status: AC | PRN

## 2020-05-20 MED ORDER — METOCLOPRAMIDE HCL 10 MG PO TABS: 5.0000 mg | ORAL_TABLET | Freq: Three times a day (TID) | ORAL | Status: DC | PRN

## 2020-05-20 MED ORDER — SODIUM CHLORIDE 0.9 % IV SOLN: INTRAVENOUS | Status: DC

## 2020-05-20 MED ORDER — CEFAZOLIN SODIUM-DEXTROSE 2-4 GM/100ML-% IV SOLN
2.0000 g | INTRAVENOUS | Status: AC
  Administered 2020-05-20: 2 g via INTRAVENOUS

## 2020-05-20 MED ORDER — FENTANYL CITRATE (PF) 100 MCG/2ML IJ SOLN
INTRAMUSCULAR | Status: DC | PRN
  Administered 2020-05-20 (×2): 50 ug via INTRAVENOUS

## 2020-05-20 MED ORDER — ORAL CARE MOUTH RINSE: 15.0000 mL | Freq: Once | OROMUCOSAL | Status: DC

## 2020-05-20 MED ORDER — ONDANSETRON HCL 4 MG/2ML IJ SOLN: 4.0000 mg | Freq: Four times a day (QID) | INTRAMUSCULAR | Status: DC | PRN

## 2020-05-20 MED ORDER — PROPOFOL 500 MG/50ML IV EMUL
INTRAVENOUS | Status: DC | PRN
  Administered 2020-05-20: 50 ug/kg/min via INTRAVENOUS

## 2020-05-20 MED ORDER — DEXMEDETOMIDINE HCL 200 MCG/2ML IV SOLN
INTRAVENOUS | Status: DC | PRN
  Administered 2020-05-20: 12 ug via INTRAVENOUS
  Administered 2020-05-20: 8 ug via INTRAVENOUS

## 2020-05-20 MED ORDER — PROPOFOL 10 MG/ML IV BOLUS
INTRAVENOUS | Status: DC | PRN
  Administered 2020-05-20: 40 mg via INTRAVENOUS

## 2020-05-20 MED ORDER — OXYCODONE-ACETAMINOPHEN 5-325 MG PO TABS
1.0000 | ORAL_TABLET | ORAL | Status: DC | PRN
  Administered 2020-05-20: 1 via ORAL

## 2020-05-20 MED ORDER — ONDANSETRON HCL 4 MG PO TABS: 4.0000 mg | ORAL_TABLET | Freq: Four times a day (QID) | ORAL | Status: DC | PRN

## 2020-05-20 MED ORDER — LACTATED RINGERS IV SOLN: INTRAVENOUS | Status: DC

## 2020-05-20 MED ORDER — PROPOFOL 10 MG/ML IV BOLUS
INTRAVENOUS | Status: AC
  Filled 2020-05-20: qty 20

## 2020-05-20 MED ORDER — CHLORHEXIDINE GLUCONATE 0.12 % MT SOLN: 15.0000 mL | Freq: Once | OROMUCOSAL | Status: DC

## 2020-05-20 MED ORDER — OXYCODONE-ACETAMINOPHEN 5-325 MG PO TABS
ORAL_TABLET | ORAL | Status: AC
  Filled 2020-05-20: qty 1

## 2020-05-20 MED ORDER — FENTANYL CITRATE (PF) 100 MCG/2ML IJ SOLN
25.0000 ug | INTRAMUSCULAR | Status: DC | PRN
Start: 2020-05-20 — End: 2020-05-20

## 2020-05-20 MED ORDER — FENTANYL CITRATE (PF) 100 MCG/2ML IJ SOLN
INTRAMUSCULAR | Status: AC
  Filled 2020-05-20: qty 2

## 2020-05-20 SURGICAL SUPPLY — 21 items
ADH SKN CLS APL DERMABOND .7 (GAUZE/BANDAGES/DRESSINGS) ×1
CEMENT KYPHON CX01A KIT/MIXER (Cement) ×2 IMPLANT
COVER WAND RF STERILE (DRAPES) ×2 IMPLANT
DERMABOND ADVANCED (GAUZE/BANDAGES/DRESSINGS) ×1
DERMABOND ADVANCED .7 DNX12 (GAUZE/BANDAGES/DRESSINGS) ×1 IMPLANT
DEVICE BIOPSY BONE KYPHX (INSTRUMENTS) ×2 IMPLANT
DRAPE C-ARM XRAY 36X54 (DRAPES) ×2 IMPLANT
DURAPREP 26ML APPLICATOR (WOUND CARE) ×2 IMPLANT
GLOVE SURG SYN 9.0  PF PI (GLOVE) ×1
GLOVE SURG SYN 9.0 PF PI (GLOVE) ×1 IMPLANT
GOWN SRG 2XL LVL 4 RGLN SLV (GOWNS) ×1 IMPLANT
GOWN STRL NON-REIN 2XL LVL4 (GOWNS) ×2
GOWN STRL REUS W/ TWL LRG LVL3 (GOWN DISPOSABLE) ×1 IMPLANT
GOWN STRL REUS W/TWL LRG LVL3 (GOWN DISPOSABLE) ×2
MANIFOLD NEPTUNE II (INSTRUMENTS) ×2 IMPLANT
PACK KYPHOPLASTY (MISCELLANEOUS) ×2 IMPLANT
RENTAL RFA GENERATOR (MISCELLANEOUS) IMPLANT
STRAP SAFETY 5IN WIDE (MISCELLANEOUS) ×2 IMPLANT
SWABSTK COMLB BENZOIN TINCTURE (MISCELLANEOUS) ×2 IMPLANT
TRAY KYPHOPAK 15/3 EXPRESS 1ST (MISCELLANEOUS) ×2 IMPLANT
TRAY KYPHOPAK 20/3 EXPRESS 1ST (MISCELLANEOUS) IMPLANT

## 2020-05-20 NOTE — Op Note (Signed)
05/20/2020  3:05 PM  PATIENT:  Breanna Zhang   MRN: [id number]   PRE-OPERATIVE DIAGNOSIS:  closed wedge compression fracture of L3   POST-OPERATIVE DIAGNOSIS:  closed wedge compression fracture of L3   PROCEDURE:  Procedure(s): KYPHOPLASTY L3  SURGEON: Laurene Footman, MD   ASSISTANTS: None   ANESTHESIA:   local and MAC   EBL:  No intake/output data recorded.   BLOOD ADMINISTERED:none   DRAINS: none    LOCAL MEDICATIONS USED:  MARCAINE    and XYLOCAINE    SPECIMEN:   L3 vertebral body biopsy   DISPOSITION OF SPECIMEN:  Pathology   COUNTS:  YES   TOURNIQUET:  * No tourniquets in log *   IMPLANTS: Bone cement   DICTATION: .Dragon Dictation  patient was brought to the operating room and after adequate anesthesia was obtained the patient was placed prone.  C arm was brought in in good visualization of the affected level obtained on both AP and lateral projections.  After patient identification and timeout procedures were completed, local anesthetic was infiltrated with 10 cc 1% Xylocaine infiltrated subcutaneously.  This is done the area on the each side of the planned approach.  The back was then prepped and draped in the usual sterile manner and repeat timeout procedure carried out.  A spinal needle was brought down to the pedicle on the each side of  L3 and a 50-50 mix of 1% Xylocaine half percent Sensorcaine with epinephrine total of 20 cc injected on each side.  After allowing this to set a small incision was made and the trocar was advanced into the vertebral body in an extrapedicular fashion.  Biopsy was obtained Drilling was carried out balloon inserted with inflation to  for cc on the right and across the midline so second approach from left side was not required..  When the cement was appropriate consistency 6 cc were injected on the right  into the vertebral body without extravasation, good fill superior to inferior endplates and from right to left sides along the inferior  endplate.  After the cement had set the trochar was removed and permanent C-arm views obtained.  The wound was closed with Dermabond followed by Band-Aid   PLAN OF CARE:  Discharge home after recovery room   PATIENT DISPOSITION:  PACU - hemodynamically stable.

## 2020-05-20 NOTE — H&P (Signed)
Images from the original note were not included. Chief Complaint: No chief complaint on file.  Breanna Zhang is a 80 y.o. female who presents today for acute back pain. Patient's had several days of severe midline low back pain. No trauma or injury. Pain is been severe. Typically taking Norco for pain but this has not been able to alleviate the pain so she was switched over to oxycodone twice daily which helps but does not last very long. Patient typically ambulates with no assistive devices but since having severe midline acute low back pain she has to ambulate with a walker and is unable to get comfortable sitting standing or lying down. She denies any numbness tingling or radicular symptoms. Pain is located along midline of the lumbar spine and is severe. She denies any sacral or thoracic back pain. Pain is interfering with quality of life and activities of daily living.  Past Medical History: Past Medical History:  Diagnosis Date  . Anxiety  . Breast cancer (CMS-HCC) 1999  right mastectomy/chemotherapy; Dr. Bary Castilla and [facility]  . DDD (degenerative disc disease), lumbar 10/06/2013  . Depression  thought to be source of prior stuttering and headache  . Diverticulosis  . DVT of lower extremity (deep venous thrombosis) (CMS-HCC) 7/10  . GERD (gastroesophageal reflux disease)  prior esophageal dilation  . Hyperglycemia 09/07/2014  . Hyperlipidemia  . Hypertension  . Macular degeneration  . Recurrent urinary tract infection  urology consult 2009  . Right knee OA 10/06/2013  . Sleep apnea  cpap 8  . Unspecified hypothyroidism   Past Surgical History: Past Surgical History:  Procedure Laterality Date  . CATARACT EXTRACTION  . CHOLECYSTECTOMY  . COLON SURGERY  R Colectomy (Diverticulitis) Dr. Rochel Brome MD  . COLONOSCOPY 03/21/2001  Adenomatous Polyp  . COLONOSCOPY 04/23/2006  (Inpt) Diverticulosis: Surgical Resection Recommended  . COLONOSCOPY 03/23/2011  PH Adenomatous Polyp:  CBF 03/2016; Recall Ltr mailed 01/31/2016 (dw)  . COLONOSCOPY 07/24/2016  Adenomatous Polyp: CBF 07/2021 if in good health per RTE  . EGD 06/27/2008, 03/21/2001, 01/09/1997  . EGD 04/12/2016  No repeat per RTE  . HYSTERECTOMY  . IMPLANTATION COCHLEAR IMPLANT Right  . IMPLANTATION COCHLEAR IMPLANT Left 5/16  [facility]: Dr. Idelle Crouch  . JOINT REPLACEMENT  right thumb [facility]  . MASTECTOMY Right 1999  Dr. Bary Castilla  . THYROIDECTOMY FOR REMOVAL REMAINING TISSUE   Past Family History: Family History  Problem Relation Age of Onset  . Diabetes type II Sister  . Alzheimer's disease Sister  . Diabetes Sister  . Cancer Sister  . Coronary Artery Disease (Blocked arteries around heart) Brother  . Colon cancer Brother  . Lung cancer Brother  . Alzheimer's disease Mother  . High blood pressure (Hypertension) Daughter  . Diabetes Son   Medications: Current Outpatient Medications Ordered in Epic  Medication Sig Dispense Refill  . ACETAMINOPHEN (TYLENOL ORAL) Take 1,000 mg by mouth every 6 (six) hours as needed.   Marland Kitchen atorvastatin (LIPITOR) 40 MG tablet TAKE 1 TABLET BY MOUTH EVERY DAY 90 tablet 1  . baclofen (LIORESAL) 5 mg tablet 1/2-1 po bid prn 60 tablet 11  . busPIRone (BUSPAR) 10 MG tablet Take 1 tablet by mouth once daily  . cholecalciferol (CHOLECALCIFEROL) 1,000 unit tablet Take 1,000 Units by mouth once daily.   . cyanocobalamin, vitamin B-12, (VITAMIN B-12 ORAL) Take by mouth. 500 mg one daily  . DULoxetine (CYMBALTA) 60 MG DR capsule Take 1 capsule (60 mg total) by mouth every morning. 30 capsule 2  .  gabapentin (NEURONTIN) 300 MG capsule TAKE 1 CAPSULE BY MOUTH TWICE A DAY 60 capsule 11  . letrozole (FEMARA) 2.5 mg tablet Take 2.5 mg by mouth once daily  . mirabegron (MYRBETRIQ) 50 mg ER tablet Take 1 tablet (50 mg total) by mouth once daily 90 tablet 3  . ondansetron (ZOFRAN) 4 MG tablet TAKE 1 TABLET (4 MG TOTAL) BY MOUTH EVERY 6 (SIX) HOURS AS NEEDED FOR NAUSEA OR VOMITING. 1  .  oxyCODONE-acetaminophen (PERCOCET) 5-325 mg tablet Take 1 tablet by mouth every 6 (six) hours as needed 20 tablet 0  . pantoprazole (PROTONIX) 40 MG DR tablet TAKE 1 TABLET BY MOUTH TWICE A DAY 180 tablet 2  . polyethylene glycol (MIRALAX) packet Take by mouth Take 17 g by mouth at bedtime  . predniSONE (DELTASONE) 5 MG tablet Take as directed - 6 day taper 21 tablet 0  . sennosides (SENOKOT) 8.6 mg tablet Take by mouth Take 2 tablets by mouth 2 (two) times daily.  Marland Kitchen SYNTHROID 100 mcg tablet TAKE 1 TABLET (100 MCG TOTAL) BY MOUTH ONCE DAILY TAKE ON AN EMPTY STOMACH WITH A GLASS OF WATER AT LEAST 30-60 MINUTES BEFORE BREAKFAST. 90 tablet 1  . traZODone (DESYREL) 100 MG tablet TAKE 1 TABLET (100 MG TOTAL) BY MOUTH AT BEDTIME AS NEEDED FOR SLEEP. 1   No current Epic-ordered facility-administered medications on file.   Allergies: Allergies  Allergen Reactions  . Adhesive Tape-Silicones Rash  . Imipramine Tinnitus  . Latex Rash  . Penicillins Rash    Review of Systems:  A comprehensive 14 point ROS was performed, reviewed by me today, and the pertinent orthopaedic findings are documented in the HPI.  Exam: LMP (LMP Unknown)  General:  Well developed, well nourished, no apparent distress, normal affect, presents in a wheelchair  HEENT: Head normocephalic, atraumatic, PERRL.   Abdomen: Soft, non tender, non distended, Bowel sounds present.  Heart: Examination of the heart reveals regular, rate, and rhythm. There is no murmur noted on ascultation. There is a normal apical pulse.  Lungs: Lungs are clear to auscultation. There is no wheeze, rhonchi, or crackles. There is normal expansion of bilateral chest walls.   Thoracolumbar spine:shows no spinous process tenderness along the thoracic spine but she has some midline lumbar tenderness along the mid lumbar spine with no paravertebral muscle tenderness. She is nontender along the iliac crest, sacrum or SI joints. She has pain with  flexion as well as extension as well as standing. Good range of motion of the hips neuro vas intact in bilateral lower extremities  CLINICAL DATA: Acute low back pain   EXAM:  CT LUMBAR SPINE WITHOUT CONTRAST   TECHNIQUE:  Multidetector CT imaging of the lumbar spine was performed without  intravenous contrast administration. Multiplanar CT image  reconstructions were also generated.   COMPARISON: Lumbar spine CT 11/27/2018, CT abdomen 03/24/2020   FINDINGS:  Segmentation: 5 lumbar type vertebrae.   Alignment: Maintained apart from endplate retropulsion at L3.   Vertebrae: Compression deformity of L3 with less than 50% loss of  height at the superior endplate. This is new since 03/24/2020. Mild  retropulsion of the endplate. No evidence of underlying lesion by  CT. Vertebral body heights are otherwise maintained.   Paraspinal and other soft tissues: No paraspinal mass. Aortic  atherosclerosis.   Disc levels:   L1-L2: Facet hypertrophy. No canal or foraminal stenosis.   L2-L3: Minimal disc bulge. Retropulsion of superior L3 endplate.  Facet hypertrophy with ligamentum flavum thickening.  Mild canal  stenosis. No foraminal stenosis.   L3-L4: Disc bulge. Facet hypertrophy. No canal or foraminal  stenosis.   L4-L5: Disc bulge. Facet hypertrophy. No canal or foraminal  stenosis.   L5-S1: Disc bulge eccentric to the left. Facet hypertrophy. No canal  or foraminal stenosis.   IMPRESSION:  Acute or subacute L3 compression fracture is new since 03/24/2020.  Less than 50% loss of height. Mild endplate retropulsion contributes  to mild canal stenosis.   Electronically Signed  By: Macy Mis M.D.  On: 05/12/2020 13:21  Impression: Closed compression fracture of L3 lumbar vertebra, initial encounter (CMS-HCC) [S32.030A] Closed compression fracture of L3 lumbar vertebra, initial encounter (CMS-HCC) (primary encounter diagnosis)  Plan:  38. 80 year old female with  acute midline low back pain. X-rays from 05/11/2020 showed mild compression deformity of L3 but CT scan from 05/12/2020 showed slight increase in compression, less than 50% vertebral body loss. Fracture appears to be acute with compared to previous x-rays. Patient's exam findings consistent with L3 compression fracture. She is unable to undergo MRI. Risks, benefits, complications of L3 compression fracture have been discussed with the patient. Patient has agreed and consented procedure with Dr. Hessie Knows This note was generated in part with voice recognition software and I apologize for any typographical errors that were not detected and corrected.  Nunzio Cobbs Gaines MPA-C   Reviewed  H+P. No changes noted.

## 2020-05-20 NOTE — Transfer of Care (Signed)
Immediate Anesthesia Transfer of Care Note  Patient: Breanna Zhang  Procedure(s) Performed: L3 KYPHOPLASTY (N/A )  Patient Location: PACU  Anesthesia Type:General  Level of Consciousness: awake, alert  and oriented  Airway & Oxygen Therapy: Patient Spontanous Breathing  Post-op Assessment: Report given to RN and Post -op Vital signs reviewed and stable  Post vital signs: Reviewed and stable  Last Vitals:  Vitals Value Taken Time  BP    Temp    Pulse    Resp    SpO2      Last Pain:  Vitals:   05/20/20 1201  TempSrc: Temporal  PainSc: 8          Complications: No complications documented.

## 2020-05-20 NOTE — Discharge Instructions (Addendum)
AMBULATORY SURGERY  DISCHARGE INSTRUCTIONS   1) The drugs that you were given will stay in your system until tomorrow so for the next 24 hours you should not:  A) Drive an automobile B) Make any legal decisions C) Drink any alcoholic beverage   2) You may resume regular meals tomorrow.  Today it is better to start with liquids and gradually work up to solid foods.  You may eat anything you prefer, but it is better to start with liquids, then soup and crackers, and gradually work up to solid foods.   3) Please notify your doctor immediately if you have any unusual bleeding, trouble breathing, redness and pain at the surgery site, drainage, fever, or pain not relieved by medication. 4)   5) Your post-operative visit with Dr.                                     is: Date:                        Time:    Please call to schedule your post-operative visit.  6) Additional Instructions:    Take it easy today and then tomorrow try to walk is much as you can. Remove Band-Aid on Saturday then okay to shower. Pain medicine as directed Call office if you are having problems.

## 2020-05-20 NOTE — Anesthesia Preprocedure Evaluation (Addendum)
Anesthesia Evaluation  Patient identified by MRN, date of birth, ID band Patient awake    Reviewed: Allergy & Precautions, NPO status , Patient's Chart, lab work & pertinent test results, reviewed documented beta blocker date and time   History of Anesthesia Complications Negative for: history of anesthetic complications  Airway Mallampati: II  TM Distance: >3 FB     Dental  (+) Upper Dentures, Lower Dentures   Pulmonary shortness of breath, sleep apnea , neg COPD, neg recent URI,           Cardiovascular hypertension, Pt. on medications (-) angina+ Peripheral Vascular Disease  (-) CAD, (-) Past MI, (-) Cardiac Stents and (-) CABG + dysrhythmias Atrial Fibrillation (-) Valvular Problems/Murmurs     Neuro/Psych  Headaches, neg Seizures PSYCHIATRIC DISORDERS Anxiety Depression  Neuromuscular disease    GI/Hepatic Neg liver ROS, GERD  Controlled,  Endo/Other  neg diabetesHypothyroidism   Renal/GU negative Renal ROS     Musculoskeletal  (+) Arthritis ,   Abdominal   Peds negative pediatric ROS (+)  Hematology   Anesthesia Other Findings Past Medical History: No date: Anxiety No date: Atrial fibrillation ([place]) 1999: Breast cancer ([place])     Comment:  RT MASTECTOMY No date: DDD (degenerative disc disease), cervical No date: Depression No date: Diverticulosis No date: Diverticulosis No date: DVT of lower extremity (deep venous thrombosis) (HCC) No date: Dyspnea No date: Family history of colon cancer No date: Family history of lung cancer No date: Family history of throat cancer No date: GERD (gastroesophageal reflux disease) No date: Goiter No date: Headache 2000: History of chemotherapy     Comment:  BREAST CA No date: Hypertension No date: Incontinence No date: OSA (obstructive sleep apnea) No date: Osteoporosis 1999: Personal history of chemotherapy     Comment:  BREAST CA No date: Status post  chemotherapy     Comment:  2000 right breast cancer No date: Thyroid disease No date: UTI (urinary tract infection) No date: Vertigo   Reproductive/Obstetrics                             Anesthesia Physical  Anesthesia Plan  ASA: III  Anesthesia Plan: MAC   Post-op Pain Management:    Induction:   PONV Risk Score and Plan: 2 and Propofol infusion and TIVA  Airway Management Planned: Natural Airway, Simple Face Mask and Nasal Cannula  Additional Equipment:   Intra-op Plan:   Post-operative Plan:   Informed Consent: I have reviewed the patients History and Physical, chart, labs and discussed the procedure including the risks, benefits and alternatives for the proposed anesthesia with the patient or authorized representative who has indicated his/her understanding and acceptance.       Plan Discussed with: CRNA  Anesthesia Plan Comments:         Anesthesia Quick Evaluation

## 2020-05-21 ENCOUNTER — Encounter: Payer: Self-pay | Admitting: Orthopedic Surgery

## 2020-05-24 DIAGNOSIS — G4733 Obstructive sleep apnea (adult) (pediatric): Secondary | ICD-10-CM | POA: Diagnosis not present

## 2020-05-24 DIAGNOSIS — E034 Atrophy of thyroid (acquired): Secondary | ICD-10-CM | POA: Diagnosis not present

## 2020-05-24 DIAGNOSIS — K219 Gastro-esophageal reflux disease without esophagitis: Secondary | ICD-10-CM | POA: Diagnosis not present

## 2020-05-24 DIAGNOSIS — E785 Hyperlipidemia, unspecified: Secondary | ICD-10-CM | POA: Diagnosis not present

## 2020-05-24 DIAGNOSIS — M810 Age-related osteoporosis without current pathological fracture: Secondary | ICD-10-CM | POA: Diagnosis not present

## 2020-05-24 DIAGNOSIS — C50919 Malignant neoplasm of unspecified site of unspecified female breast: Secondary | ICD-10-CM | POA: Diagnosis not present

## 2020-05-24 DIAGNOSIS — I700 Atherosclerosis of aorta: Secondary | ICD-10-CM | POA: Diagnosis not present

## 2020-05-24 DIAGNOSIS — R739 Hyperglycemia, unspecified: Secondary | ICD-10-CM | POA: Diagnosis not present

## 2020-05-24 DIAGNOSIS — F3342 Major depressive disorder, recurrent, in full remission: Secondary | ICD-10-CM | POA: Diagnosis not present

## 2020-05-24 DIAGNOSIS — M8588 Other specified disorders of bone density and structure, other site: Secondary | ICD-10-CM | POA: Diagnosis not present

## 2020-05-24 DIAGNOSIS — I10 Essential (primary) hypertension: Secondary | ICD-10-CM | POA: Diagnosis not present

## 2020-05-24 DIAGNOSIS — C7802 Secondary malignant neoplasm of left lung: Secondary | ICD-10-CM | POA: Diagnosis not present

## 2020-05-24 DIAGNOSIS — M5136 Other intervertebral disc degeneration, lumbar region: Secondary | ICD-10-CM | POA: Diagnosis not present

## 2020-05-24 LAB — SURGICAL PATHOLOGY

## 2020-05-25 NOTE — Anesthesia Postprocedure Evaluation (Signed)
Anesthesia Post Note  Patient: Breanna Zhang  Procedure(s) Performed: L3 KYPHOPLASTY (N/A )  Patient location during evaluation: PACU Anesthesia Type: MAC Level of consciousness: oriented and awake and alert Pain management: pain level controlled Vital Signs Assessment: post-procedure vital signs reviewed and stable Respiratory status: spontaneous breathing Cardiovascular status: blood pressure returned to baseline Anesthetic complications: no   No complications documented.   Last Vitals:  Vitals:   05/20/20 1545 05/20/20 1551  BP: 140/67 125/64  Pulse: 78 79  Resp: 16 18  Temp: 36.5 C 36.4 C  SpO2: 99% 100%    Last Pain:  Vitals:   05/21/20 0754  TempSrc:   PainSc: 3                  [name]

## 2020-05-28 ENCOUNTER — Other Ambulatory Visit (HOSPITAL_COMMUNITY): Payer: Self-pay | Admitting: Psychiatry

## 2020-05-28 DIAGNOSIS — F4310 Post-traumatic stress disorder, unspecified: Secondary | ICD-10-CM

## 2020-05-28 DIAGNOSIS — F3342 Major depressive disorder, recurrent, in full remission: Secondary | ICD-10-CM

## 2020-06-02 ENCOUNTER — Telehealth: Payer: Self-pay | Admitting: Urology

## 2020-06-02 NOTE — Telephone Encounter (Signed)
Pt. LVM today wanting to know if she could stop taking the mirabegron ER [facility]) 50MG , or is there a cheaper alternative? Please call pt. To advise

## 2020-06-02 NOTE — Telephone Encounter (Signed)
Instructed patient to call insurance company to see which medications for OAB would cover and let us know

## 2020-06-03 DIAGNOSIS — Z9889 Other specified postprocedural states: Secondary | ICD-10-CM | POA: Diagnosis not present

## 2020-06-03 DIAGNOSIS — M546 Pain in thoracic spine: Secondary | ICD-10-CM | POA: Diagnosis not present

## 2020-06-03 DIAGNOSIS — M5416 Radiculopathy, lumbar region: Secondary | ICD-10-CM | POA: Diagnosis not present

## 2020-06-04 DIAGNOSIS — H353211 Exudative age-related macular degeneration, right eye, with active choroidal neovascularization: Secondary | ICD-10-CM | POA: Diagnosis not present

## 2020-06-20 ENCOUNTER — Other Ambulatory Visit (HOSPITAL_COMMUNITY): Payer: Self-pay | Admitting: Psychiatry

## 2020-06-20 DIAGNOSIS — F3342 Major depressive disorder, recurrent, in full remission: Secondary | ICD-10-CM

## 2020-07-05 DIAGNOSIS — M5136 Other intervertebral disc degeneration, lumbar region: Secondary | ICD-10-CM | POA: Diagnosis not present

## 2020-07-05 DIAGNOSIS — M5416 Radiculopathy, lumbar region: Secondary | ICD-10-CM | POA: Diagnosis not present

## 2020-07-05 DIAGNOSIS — M48062 Spinal stenosis, lumbar region with neurogenic claudication: Secondary | ICD-10-CM | POA: Diagnosis not present

## 2020-07-06 DIAGNOSIS — M5416 Radiculopathy, lumbar region: Secondary | ICD-10-CM | POA: Diagnosis not present

## 2020-07-06 DIAGNOSIS — M48062 Spinal stenosis, lumbar region with neurogenic claudication: Secondary | ICD-10-CM | POA: Diagnosis not present

## 2020-07-06 DIAGNOSIS — M5136 Other intervertebral disc degeneration, lumbar region: Secondary | ICD-10-CM | POA: Diagnosis not present

## 2020-07-22 DIAGNOSIS — H353221 Exudative age-related macular degeneration, left eye, with active choroidal neovascularization: Secondary | ICD-10-CM | POA: Diagnosis not present

## 2020-07-27 DIAGNOSIS — M48062 Spinal stenosis, lumbar region with neurogenic claudication: Secondary | ICD-10-CM | POA: Diagnosis not present

## 2020-07-27 DIAGNOSIS — Z79899 Other long term (current) drug therapy: Secondary | ICD-10-CM | POA: Diagnosis not present

## 2020-07-27 DIAGNOSIS — M5136 Other intervertebral disc degeneration, lumbar region: Secondary | ICD-10-CM | POA: Diagnosis not present

## 2020-07-27 DIAGNOSIS — M5416 Radiculopathy, lumbar region: Secondary | ICD-10-CM | POA: Diagnosis not present

## 2020-08-12 DIAGNOSIS — H353211 Exudative age-related macular degeneration, right eye, with active choroidal neovascularization: Secondary | ICD-10-CM | POA: Diagnosis not present

## 2020-08-26 DIAGNOSIS — Z96642 Presence of left artificial hip joint: Secondary | ICD-10-CM | POA: Diagnosis not present

## 2020-08-26 DIAGNOSIS — M4726 Other spondylosis with radiculopathy, lumbar region: Secondary | ICD-10-CM | POA: Diagnosis not present

## 2020-08-26 DIAGNOSIS — I10 Essential (primary) hypertension: Secondary | ICD-10-CM | POA: Diagnosis not present

## 2020-08-26 DIAGNOSIS — C50919 Malignant neoplasm of unspecified site of unspecified female breast: Secondary | ICD-10-CM | POA: Diagnosis not present

## 2020-08-26 DIAGNOSIS — G8929 Other chronic pain: Secondary | ICD-10-CM | POA: Diagnosis not present

## 2020-08-26 DIAGNOSIS — E785 Hyperlipidemia, unspecified: Secondary | ICD-10-CM | POA: Diagnosis not present

## 2020-08-26 DIAGNOSIS — Z90710 Acquired absence of both cervix and uterus: Secondary | ICD-10-CM | POA: Diagnosis not present

## 2020-08-26 DIAGNOSIS — Z86718 Personal history of other venous thrombosis and embolism: Secondary | ICD-10-CM | POA: Diagnosis not present

## 2020-08-26 DIAGNOSIS — G629 Polyneuropathy, unspecified: Secondary | ICD-10-CM | POA: Diagnosis not present

## 2020-08-26 DIAGNOSIS — F419 Anxiety disorder, unspecified: Secondary | ICD-10-CM | POA: Diagnosis not present

## 2020-08-26 DIAGNOSIS — H3530 Unspecified macular degeneration: Secondary | ICD-10-CM | POA: Diagnosis not present

## 2020-08-26 DIAGNOSIS — F32A Depression, unspecified: Secondary | ICD-10-CM | POA: Diagnosis not present

## 2020-08-26 DIAGNOSIS — M5418 Radiculopathy, sacral and sacrococcygeal region: Secondary | ICD-10-CM | POA: Diagnosis not present

## 2020-08-26 DIAGNOSIS — M5116 Intervertebral disc disorders with radiculopathy, lumbar region: Secondary | ICD-10-CM | POA: Diagnosis not present

## 2020-08-26 DIAGNOSIS — E039 Hypothyroidism, unspecified: Secondary | ICD-10-CM | POA: Diagnosis not present

## 2020-08-26 DIAGNOSIS — Z9181 History of falling: Secondary | ICD-10-CM | POA: Diagnosis not present

## 2020-08-26 DIAGNOSIS — M48062 Spinal stenosis, lumbar region with neurogenic claudication: Secondary | ICD-10-CM | POA: Diagnosis not present

## 2020-08-26 DIAGNOSIS — K5790 Diverticulosis of intestine, part unspecified, without perforation or abscess without bleeding: Secondary | ICD-10-CM | POA: Diagnosis not present

## 2020-08-26 DIAGNOSIS — M170 Bilateral primary osteoarthritis of knee: Secondary | ICD-10-CM | POA: Diagnosis not present

## 2020-08-26 DIAGNOSIS — Z9849 Cataract extraction status, unspecified eye: Secondary | ICD-10-CM | POA: Diagnosis not present

## 2020-08-26 DIAGNOSIS — Z87440 Personal history of urinary (tract) infections: Secondary | ICD-10-CM | POA: Diagnosis not present

## 2020-08-26 DIAGNOSIS — G4730 Sleep apnea, unspecified: Secondary | ICD-10-CM | POA: Diagnosis not present

## 2020-08-26 DIAGNOSIS — K219 Gastro-esophageal reflux disease without esophagitis: Secondary | ICD-10-CM | POA: Diagnosis not present

## 2020-09-01 DIAGNOSIS — M5116 Intervertebral disc disorders with radiculopathy, lumbar region: Secondary | ICD-10-CM | POA: Diagnosis not present

## 2020-09-01 DIAGNOSIS — G8929 Other chronic pain: Secondary | ICD-10-CM | POA: Diagnosis not present

## 2020-09-01 DIAGNOSIS — M48062 Spinal stenosis, lumbar region with neurogenic claudication: Secondary | ICD-10-CM | POA: Diagnosis not present

## 2020-09-01 DIAGNOSIS — M5418 Radiculopathy, sacral and sacrococcygeal region: Secondary | ICD-10-CM | POA: Diagnosis not present

## 2020-09-01 DIAGNOSIS — M4726 Other spondylosis with radiculopathy, lumbar region: Secondary | ICD-10-CM | POA: Diagnosis not present

## 2020-09-01 DIAGNOSIS — M170 Bilateral primary osteoarthritis of knee: Secondary | ICD-10-CM | POA: Diagnosis not present

## 2020-09-01 DIAGNOSIS — G629 Polyneuropathy, unspecified: Secondary | ICD-10-CM | POA: Diagnosis not present

## 2020-09-13 DIAGNOSIS — Z96642 Presence of left artificial hip joint: Secondary | ICD-10-CM | POA: Diagnosis not present

## 2020-09-13 DIAGNOSIS — Z9849 Cataract extraction status, unspecified eye: Secondary | ICD-10-CM | POA: Diagnosis not present

## 2020-09-13 DIAGNOSIS — M5418 Radiculopathy, sacral and sacrococcygeal region: Secondary | ICD-10-CM | POA: Diagnosis not present

## 2020-09-13 DIAGNOSIS — C50919 Malignant neoplasm of unspecified site of unspecified female breast: Secondary | ICD-10-CM | POA: Diagnosis not present

## 2020-09-13 DIAGNOSIS — M4726 Other spondylosis with radiculopathy, lumbar region: Secondary | ICD-10-CM | POA: Diagnosis not present

## 2020-09-13 DIAGNOSIS — M48062 Spinal stenosis, lumbar region with neurogenic claudication: Secondary | ICD-10-CM | POA: Diagnosis not present

## 2020-09-13 DIAGNOSIS — M170 Bilateral primary osteoarthritis of knee: Secondary | ICD-10-CM | POA: Diagnosis not present

## 2020-09-13 DIAGNOSIS — Z90710 Acquired absence of both cervix and uterus: Secondary | ICD-10-CM | POA: Diagnosis not present

## 2020-09-13 DIAGNOSIS — E785 Hyperlipidemia, unspecified: Secondary | ICD-10-CM | POA: Diagnosis not present

## 2020-09-13 DIAGNOSIS — Z9181 History of falling: Secondary | ICD-10-CM | POA: Diagnosis not present

## 2020-09-13 DIAGNOSIS — G4730 Sleep apnea, unspecified: Secondary | ICD-10-CM | POA: Diagnosis not present

## 2020-09-13 DIAGNOSIS — G8929 Other chronic pain: Secondary | ICD-10-CM | POA: Diagnosis not present

## 2020-09-13 DIAGNOSIS — H3530 Unspecified macular degeneration: Secondary | ICD-10-CM | POA: Diagnosis not present

## 2020-09-13 DIAGNOSIS — E039 Hypothyroidism, unspecified: Secondary | ICD-10-CM | POA: Diagnosis not present

## 2020-09-13 DIAGNOSIS — M5116 Intervertebral disc disorders with radiculopathy, lumbar region: Secondary | ICD-10-CM | POA: Diagnosis not present

## 2020-09-13 DIAGNOSIS — K5790 Diverticulosis of intestine, part unspecified, without perforation or abscess without bleeding: Secondary | ICD-10-CM | POA: Diagnosis not present

## 2020-09-13 DIAGNOSIS — G629 Polyneuropathy, unspecified: Secondary | ICD-10-CM | POA: Diagnosis not present

## 2020-09-13 DIAGNOSIS — Z87440 Personal history of urinary (tract) infections: Secondary | ICD-10-CM | POA: Diagnosis not present

## 2020-09-13 DIAGNOSIS — F32A Depression, unspecified: Secondary | ICD-10-CM | POA: Diagnosis not present

## 2020-09-13 DIAGNOSIS — Z86718 Personal history of other venous thrombosis and embolism: Secondary | ICD-10-CM | POA: Diagnosis not present

## 2020-09-13 DIAGNOSIS — K219 Gastro-esophageal reflux disease without esophagitis: Secondary | ICD-10-CM | POA: Diagnosis not present

## 2020-09-13 DIAGNOSIS — F419 Anxiety disorder, unspecified: Secondary | ICD-10-CM | POA: Diagnosis not present

## 2020-09-13 DIAGNOSIS — I10 Essential (primary) hypertension: Secondary | ICD-10-CM | POA: Diagnosis not present

## 2020-09-15 DIAGNOSIS — R739 Hyperglycemia, unspecified: Secondary | ICD-10-CM | POA: Diagnosis not present

## 2020-09-15 DIAGNOSIS — C50919 Malignant neoplasm of unspecified site of unspecified female breast: Secondary | ICD-10-CM | POA: Diagnosis not present

## 2020-09-15 DIAGNOSIS — I700 Atherosclerosis of aorta: Secondary | ICD-10-CM | POA: Diagnosis not present

## 2020-09-15 DIAGNOSIS — M25512 Pain in left shoulder: Secondary | ICD-10-CM | POA: Diagnosis not present

## 2020-09-15 DIAGNOSIS — M5136 Other intervertebral disc degeneration, lumbar region: Secondary | ICD-10-CM | POA: Diagnosis not present

## 2020-09-15 DIAGNOSIS — C7802 Secondary malignant neoplasm of left lung: Secondary | ICD-10-CM | POA: Diagnosis not present

## 2020-09-15 DIAGNOSIS — M25562 Pain in left knee: Secondary | ICD-10-CM | POA: Diagnosis not present

## 2020-09-15 DIAGNOSIS — F3342 Major depressive disorder, recurrent, in full remission: Secondary | ICD-10-CM | POA: Diagnosis not present

## 2020-09-15 DIAGNOSIS — G8929 Other chronic pain: Secondary | ICD-10-CM | POA: Diagnosis not present

## 2020-09-15 DIAGNOSIS — M25561 Pain in right knee: Secondary | ICD-10-CM | POA: Diagnosis not present

## 2020-09-15 DIAGNOSIS — R29898 Other symptoms and signs involving the musculoskeletal system: Secondary | ICD-10-CM | POA: Diagnosis not present

## 2020-09-15 DIAGNOSIS — M25511 Pain in right shoulder: Secondary | ICD-10-CM | POA: Diagnosis not present

## 2020-09-16 ENCOUNTER — Other Ambulatory Visit: Payer: Self-pay | Admitting: Internal Medicine

## 2020-09-16 DIAGNOSIS — R29898 Other symptoms and signs involving the musculoskeletal system: Secondary | ICD-10-CM

## 2020-09-16 DIAGNOSIS — M5136 Other intervertebral disc degeneration, lumbar region: Secondary | ICD-10-CM

## 2020-09-20 ENCOUNTER — Other Ambulatory Visit: Payer: Self-pay | Admitting: Oncology

## 2020-09-21 ENCOUNTER — Other Ambulatory Visit (HOSPITAL_COMMUNITY): Payer: Self-pay | Admitting: Psychiatry

## 2020-09-21 DIAGNOSIS — M5416 Radiculopathy, lumbar region: Secondary | ICD-10-CM | POA: Diagnosis not present

## 2020-09-21 DIAGNOSIS — M5136 Other intervertebral disc degeneration, lumbar region: Secondary | ICD-10-CM | POA: Diagnosis not present

## 2020-09-21 DIAGNOSIS — F3342 Major depressive disorder, recurrent, in full remission: Secondary | ICD-10-CM

## 2020-09-21 DIAGNOSIS — Z8781 Personal history of (healed) traumatic fracture: Secondary | ICD-10-CM | POA: Diagnosis not present

## 2020-09-21 DIAGNOSIS — M48062 Spinal stenosis, lumbar region with neurogenic claudication: Secondary | ICD-10-CM | POA: Diagnosis not present

## 2020-09-22 DIAGNOSIS — I10 Essential (primary) hypertension: Secondary | ICD-10-CM | POA: Diagnosis not present

## 2020-09-22 DIAGNOSIS — I700 Atherosclerosis of aorta: Secondary | ICD-10-CM | POA: Diagnosis not present

## 2020-09-22 DIAGNOSIS — F3342 Major depressive disorder, recurrent, in full remission: Secondary | ICD-10-CM | POA: Diagnosis not present

## 2020-09-22 DIAGNOSIS — K219 Gastro-esophageal reflux disease without esophagitis: Secondary | ICD-10-CM | POA: Diagnosis not present

## 2020-09-22 DIAGNOSIS — M5136 Other intervertebral disc degeneration, lumbar region: Secondary | ICD-10-CM | POA: Diagnosis not present

## 2020-09-23 ENCOUNTER — Encounter
Admission: RE | Admit: 2020-09-23 | Discharge: 2020-09-23 | Disposition: A | Discharge status: Home / Self Care | Payer: PPO | Source: Ambulatory Visit | Attending: Oncology | Admitting: Oncology

## 2020-09-23 ENCOUNTER — Ambulatory Visit
Admission: RE | Admit: 2020-09-23 | Discharge: 2020-09-23 | Disposition: A | Discharge status: Home / Self Care | Payer: PPO | Source: Ambulatory Visit | Attending: Oncology | Admitting: Oncology

## 2020-09-23 ENCOUNTER — Other Ambulatory Visit: Payer: Self-pay

## 2020-09-23 DIAGNOSIS — K449 Diaphragmatic hernia without obstruction or gangrene: Secondary | ICD-10-CM | POA: Diagnosis not present

## 2020-09-23 DIAGNOSIS — M25562 Pain in left knee: Secondary | ICD-10-CM | POA: Diagnosis not present

## 2020-09-23 DIAGNOSIS — R590 Localized enlarged lymph nodes: Secondary | ICD-10-CM | POA: Insufficient documentation

## 2020-09-23 DIAGNOSIS — R918 Other nonspecific abnormal finding of lung field: Secondary | ICD-10-CM | POA: Diagnosis not present

## 2020-09-23 DIAGNOSIS — Z08 Encounter for follow-up examination after completed treatment for malignant neoplasm: Secondary | ICD-10-CM | POA: Diagnosis present

## 2020-09-23 DIAGNOSIS — Z9011 Acquired absence of right breast and nipple: Secondary | ICD-10-CM | POA: Insufficient documentation

## 2020-09-23 DIAGNOSIS — C50919 Malignant neoplasm of unspecified site of unspecified female breast: Secondary | ICD-10-CM | POA: Diagnosis not present

## 2020-09-23 DIAGNOSIS — I700 Atherosclerosis of aorta: Secondary | ICD-10-CM | POA: Diagnosis not present

## 2020-09-23 DIAGNOSIS — M47814 Spondylosis without myelopathy or radiculopathy, thoracic region: Secondary | ICD-10-CM | POA: Diagnosis not present

## 2020-09-23 DIAGNOSIS — Z853 Personal history of malignant neoplasm of breast: Secondary | ICD-10-CM | POA: Insufficient documentation

## 2020-09-23 DIAGNOSIS — J9811 Atelectasis: Secondary | ICD-10-CM | POA: Diagnosis not present

## 2020-09-23 DIAGNOSIS — I2510 Atherosclerotic heart disease of native coronary artery without angina pectoris: Secondary | ICD-10-CM | POA: Diagnosis not present

## 2020-09-23 DIAGNOSIS — Z96642 Presence of left artificial hip joint: Secondary | ICD-10-CM | POA: Diagnosis not present

## 2020-09-23 LAB — POCT I-STAT CREATININE: Creatinine, Ser: 0.7 mg/dL (ref 0.44–1.00)

## 2020-09-23 MED ORDER — IOHEXOL 300 MG/ML  SOLN
75.0000 mL | Freq: Once | INTRAMUSCULAR | Status: AC | PRN
  Administered 2020-09-23: 75 mL via INTRAVENOUS

## 2020-09-23 MED ORDER — IOHEXOL 300 MG/ML  SOLN: 100.0000 mL | Freq: Once | INTRAMUSCULAR | Status: DC | PRN

## 2020-09-23 MED ORDER — TECHNETIUM TC 99M MEDRONATE IV KIT
20.0000 | PACK | Freq: Once | INTRAVENOUS | Status: AC | PRN
  Administered 2020-09-23: 23.85 via INTRAVENOUS

## 2020-09-28 ENCOUNTER — Inpatient Hospital Stay (HOSPITAL_BASED_OUTPATIENT_CLINIC_OR_DEPARTMENT_OTHER): Payer: PPO | Admitting: Oncology

## 2020-09-28 ENCOUNTER — Other Ambulatory Visit: Payer: Self-pay

## 2020-09-28 ENCOUNTER — Inpatient Hospital Stay: Payer: PPO | Attending: Oncology

## 2020-09-28 ENCOUNTER — Encounter: Payer: Self-pay | Admitting: Oncology

## 2020-09-28 VITALS — BP 116/66 | HR 74 | Temp 96.8°F | Wt 182.0 lb

## 2020-09-28 DIAGNOSIS — M25562 Pain in left knee: Secondary | ICD-10-CM | POA: Insufficient documentation

## 2020-09-28 DIAGNOSIS — Z79811 Long term (current) use of aromatase inhibitors: Secondary | ICD-10-CM | POA: Insufficient documentation

## 2020-09-28 DIAGNOSIS — C50919 Malignant neoplasm of unspecified site of unspecified female breast: Secondary | ICD-10-CM

## 2020-09-28 DIAGNOSIS — Z170 Estrogen receptor positive status [ER+]: Secondary | ICD-10-CM | POA: Diagnosis not present

## 2020-09-28 DIAGNOSIS — C50912 Malignant neoplasm of unspecified site of left female breast: Secondary | ICD-10-CM | POA: Insufficient documentation

## 2020-09-28 DIAGNOSIS — C782 Secondary malignant neoplasm of pleura: Secondary | ICD-10-CM | POA: Diagnosis not present

## 2020-09-28 LAB — CBC WITH DIFFERENTIAL/PLATELET
Abs Immature Granulocytes: 0.02 10*3/uL (ref 0.00–0.07)
Basophils Absolute: 0.1 10*3/uL (ref 0.0–0.1)
Basophils Relative: 1 %
Eosinophils Absolute: 0.1 10*3/uL (ref 0.0–0.5)
Eosinophils Relative: 1 %
HCT: 39.7 % (ref 36.0–46.0)
Hemoglobin: 12.7 g/dL (ref 12.0–15.0)
Immature Granulocytes: 0 %
Lymphocytes Relative: 26 %
Lymphs Abs: 1.8 10*3/uL (ref 0.7–4.0)
MCH: 29 pg (ref 26.0–34.0)
MCHC: 32 g/dL (ref 30.0–36.0)
MCV: 90.6 fL (ref 80.0–100.0)
Monocytes Absolute: 0.8 10*3/uL (ref 0.1–1.0)
Monocytes Relative: 11 %
Neutro Abs: 4.3 10*3/uL (ref 1.7–7.7)
Neutrophils Relative %: 61 %
Platelets: 224 10*3/uL (ref 150–400)
RBC: 4.38 MIL/uL (ref 3.87–5.11)
RDW: 12.4 % (ref 11.5–15.5)
WBC: 7.1 10*3/uL (ref 4.0–10.5)
nRBC: 0 % (ref 0.0–0.2)

## 2020-09-28 LAB — COMPREHENSIVE METABOLIC PANEL
ALT: 18 U/L (ref 0–44)
AST: 26 U/L (ref 15–41)
Albumin: 3.8 g/dL (ref 3.5–5.0)
Alkaline Phosphatase: 60 U/L (ref 38–126)
Anion gap: 10 (ref 5–15)
BUN: 13 mg/dL (ref 8–23)
CO2: 24 mmol/L (ref 22–32)
Calcium: 8.7 mg/dL — ABNORMAL LOW (ref 8.9–10.3)
Chloride: 102 mmol/L (ref 98–111)
Creatinine, Ser: 0.84 mg/dL (ref 0.44–1.00)
GFR, Estimated: >60 mL/min (ref >60)
Glucose, Bld: 97 mg/dL (ref 70–99)
Potassium: 4 mmol/L (ref 3.5–5.1)
Sodium: 136 mmol/L (ref 135–145)
Total Bilirubin: 0.9 mg/dL (ref 0.3–1.2)
Total Protein: 6.6 g/dL (ref 6.5–8.1)

## 2020-09-28 MED ORDER — ONDANSETRON HCL 4 MG PO TABS: 4.0000 mg | ORAL_TABLET | Freq: Four times a day (QID) | ORAL | 1 refills | Status: DC | PRN

## 2020-09-28 NOTE — Progress Notes (Signed)
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  She underwent mastectomy followed by adjuvant chemotherapy and 5 years of tamoxifen.  Patient has been having some low back pain and underwent a CT lumbar spine without contrast on 08/10/2017 which was done by the pain clinic.  CT mainly showed age-related degenerative disease but also showed incidental nodular thickening of the posterior left diaphragm concerning for tumor and a CT chest was recommended.   CT chest on 08/14/2017 showed scattered left pleural nodularity with tiny left pleural effusion and prominent left internal mammary and juxtadiaphragmatic lymph nodes which are nonspecific but metastatic disease could not be ruled out.   This was followed by a PET/CT scan on 08/22/2017 which showed numerous small left-sided pleural nodules which were mildly hypermetabolic along with hypermetabolic mediastinal lymph nodes concerning for metastatic disease.  Small metastatic nodule in the left upper quadrant partly in the retrocrural space.  No findings of pulmonary metastatic or osseous metastatic disease.    Patient underwent FNA of retrocrural LN which was positive for carcinoma but primary could not be ascertained. She then underwent pleural  biopsy which showed metastatic carcinoma consistent with breast primary. ER+ HER-2.  Patient started taking Ibrance in August 2019.  Ibrance on hold since January 2021 with stable disease on letrozole   NGS testing showed PIKCA and BRCA mutation     Interval history-patient has multiple areas of joint pain including the left shoulder pain left knee pain as well as low back pain and left hip pain which are all chronic issues that have been bothering her.  She follows up with orthopedics for this as well.  Recently had a closed wedge compression fracture of L3 and patient underwent kyphoplasty for the same  ECOG PS- 2 Pain scale- 3   Review of systems- Review of Systems  Constitutional:  Negative for chills, fever, malaise/fatigue and weight loss.  HENT:  Negative for congestion, ear discharge and nosebleeds.   Eyes:  Negative for blurred vision.  Respiratory:  Negative for cough, hemoptysis, sputum production, shortness of breath and wheezing.   Cardiovascular:  Negative for chest pain, palpitations, orthopnea and claudication.  Gastrointestinal:  Negative for abdominal pain, blood in stool, constipation, diarrhea, heartburn, melena, nausea and vomiting.  Genitourinary:  Negative for dysuria, flank pain, frequency, hematuria and urgency.  Musculoskeletal:  Positive for back pain and joint pain. Negative for myalgias.  Skin:  Negative for rash.  Neurological:  Negative for dizziness, tingling, focal weakness, seizures, weakness and headaches.  Endo/Heme/Allergies:  Does not bruise/bleed easily.  Psychiatric/Behavioral:  Negative for depression and suicidal ideas. The patient does not have insomnia.      Allergies  Allergen Reactions   Imipramine Tinitus   Latex Rash   Penicillins Rash   Tape Rash     Past Medical History:  Diagnosis Date  Anxiety    Atrial fibrillation (HCC)    Breast cancer (HCC)    Breast cancer, right ([place]) 1999   RT MASTECTOMY and chemo tx's.    DDD  (degenerative disc disease), cervical    hands and back with arthritis   Depression    Diverticulosis    Diverticulosis    DVT of lower extremity (deep venous thrombosis) (HCC)    Dyspnea    Family history of colon cancer    Family history of lung cancer    Family history of throat cancer    GERD (gastroesophageal reflux disease)    Goiter    Headache    History of chemotherapy 2000   BREAST CA   Hypertension    Incontinence    Neuromuscular disorder ([place])    peipheral neuropathy   OSA (obstructive sleep apnea)    Osteoporosis    Personal history of chemotherapy 1999   BREAST CA   Status post chemotherapy    2000 right breast cancer   Thyroid disease    UTI (urinary tract infection)    Vertigo      Past Surgical History:  Procedure Laterality Date   ABDOMINAL HYSTERECTOMY     BREAST BIOPSY Left 2006   negative   BUNIONECTOMY Bilateral    Screws implanted.   CHOLECYSTECTOMY     COCHLEAR IMPLANT Right    COLONOSCOPY WITH PROPOFOL N/A 07/24/2016   Procedure: COLONOSCOPY WITH PROPOFOL;  Surgeon: Manya Silvas, MD;  Location: [facility] ENDOSCOPY;  Service: Endoscopy;  Laterality: N/A;   ESOPHAGOGASTRODUODENOSCOPY (EGD) WITH PROPOFOL N/A 04/12/2016   Procedure: ESOPHAGOGASTRODUODENOSCOPY (EGD) WITH PROPOFOL;  Surgeon: Manya Silvas, MD;  Location: [facility] ENDOSCOPY;  Service: Endoscopy;  Laterality: N/A;   EYE SURGERY Bilateral    cataract   FOOT SURGERY Bilateral    HAND SURGERY Right    KYPHOPLASTY N/A 05/20/2020   Procedure: L3 KYPHOPLASTY;  Surgeon: Hessie Knows, MD;  Location: [facility] ORS;  Service: Orthopedics;  Laterality: N/A;   MASTECTOMY Right 1999   BREAST CA   MASTECTOMY Right 1999   PARTIAL COLECTOMY     THYROIDECTOMY     TOTAL HIP ARTHROPLASTY Left 03/19/2018   Procedure: TOTAL HIP ARTHROPLASTY LEFT;  Surgeon: Hessie Knows, MD;  Location: [facility] ORS;  Service: Orthopedics;  Laterality: Left;   VIDEO ASSISTED THORACOSCOPY Left 09/10/2017   Procedure: VIDEO  ASSISTED THORACOSCOPY;  Surgeon: [name], MD;  Location: [facility] ORS;  Service: Thoracic;  Laterality: Left;  with biopsies   VIDEO BRONCHOSCOPY  09/10/2017   Procedure: VIDEO BRONCHOSCOPY;  Surgeon: [name], MD;  Location: [facility] ORS;  Service: Thoracic;;    Social History   Socioeconomic History   Marital status: Married    Spouse name: evert   Number of children: 4   Years of education: Not on file   Highest education level: 9th grade  Occupational History   Not on file  Tobacco Use   Smoking status: Never   Smokeless tobacco: Never  Vaping Use   Vaping Use: Never used  Substance and Sexual Activity   Alcohol use: No    Alcohol/week: 0.0 standard drinks   Drug use: No   Sexual activity: Not Currently  Other Topics Concern   Not on file  Social History Narrative   Not on file   Social Determinants of Health   Financial Resource Strain: Not on file  Food Insecurity: Not on file  Transportation Needs: Not on file  Physical Activity: Not on file  Stress: Not on file  Social Connections: Not on file  Intimate Partner Violence: Not on file    Family History  Problem Relation Age of Onset   Dementia Mother    Dementia Sister    Diabetes Sister    Heart attack Brother 86   COPD Brother    Lung cancer Brother        hx smoking   Colon cancer Brother    Liver cancer Brother    COPD Brother    Lung cancer Brother        hx smoking   Colon cancer Maternal Grandfather        dx >.50   Breast cancer Neg Hx      Current Outpatient Medications:    acetaminophen (TYLENOL) 325 MG tablet, Take 650 mg by mouth every 4 (four) hours as needed. For pain / increased temp.  May be administered orally,  per G-Tube if needed or rectally if unable to swallow (separate order).  Maximum dose for 24 hours is 3,000 mg from all sources of Acetaminophen / Tylenol, Disp: , Rfl:    atorvastatin (LIPITOR) 40 MG tablet, Take 1 tablet (40 mg total) by mouth at bedtime., Disp: 30  tablet, Rfl: 0   Baclofen 5 MG TABS, Take 1 tablet by mouth 2 (two) times daily., Disp: , Rfl:    busPIRone (BUSPAR) 10 MG tablet, TAKE 1 TABLET BY MOUTH TWICE A DAY, Disp: 180 tablet, Rfl: 0   cholecalciferol (VITAMIN D) 1000 units tablet, Take 1,000 Units by mouth daily., Disp: , Rfl:    DULoxetine (CYMBALTA) 60 MG capsule, TAKE 1 CAPSULE BY MOUTH EVERY MORNING, Disp: 90 capsule, Rfl: 0   gabapentin (NEURONTIN) 300 MG capsule, Take 300 mg by mouth 2 (two) times daily., Disp: , Rfl:    [START ON 10/15/2020] HYDROcodone-acetaminophen (NORCO/VICODIN) 5-325 MG tablet, Take by mouth., Disp: , Rfl:    letrozole (FEMARA) 2.5 MG tablet, TAKE 1 TABLET BY MOUTH EVERY DAY, Disp: 90 tablet, Rfl: 1   mirabegron ER (MYRBETRIQ) 50 MG TB24 tablet, Take 1 tablet (50 mg total) by mouth daily., Disp: 90 tablet, Rfl: 3   oxyCODONE-acetaminophen (PERCOCET) 5-325 MG tablet, Take 1 tablet by mouth every 4 (four) hours as needed for severe pain., Disp: 20 tablet, Rfl: 0   pantoprazole (PROTONIX) 40 MG tablet, Take 1 tablet (40 mg total) by mouth 2 (two) times daily., Disp: 60 tablet, Rfl: 0   predniSONE (DELTASONE) 10 MG tablet, PLEASE SEE ATTACHED FOR DETAILED DIRECTIONS, Disp: , Rfl:    traZODone (DESYREL) 100 MG tablet, TAKE 1 TABLET BY MOUTH EVERYDAY AT BEDTIME, Disp: 90 tablet, Rfl: 0   vitamin B-12 (CYANOCOBALAMIN) 500 MCG tablet, Take 500 mcg by mouth daily. , Disp: , Rfl:    levothyroxine (SYNTHROID) 100 MCG tablet, Take 1 tablet by mouth daily., Disp: , Rfl:    MODERNA COVID-19 VACCINE 100 MCG/0.5ML injection, , Disp: , Rfl:    ondansetron (ZOFRAN) 4 MG tablet, Take 1 tablet (4 mg total) by mouth every 6 (six) hours as needed for nausea or vomiting., Disp: 30 tablet, Rfl: 1  Physical exam:  Vitals:   09/28/20 1124 09/28/20 1126  BP: 116/66   Pulse: 74   Temp: (!) 96.8 F (36 C)   TempSrc: Tympanic   Weight:  182 lb (82.6 kg)   Physical Exam Constitutional:      General: She is not in acute distress.     Comments: Elderly woman sitting in a wheelchair.  Appears in no acute distress  Cardiovascular:     Rate and Rhythm: Normal rate and regular rhythm.     Heart sounds: Normal heart sounds.  Pulmonary:     Effort: Pulmonary effort is normal.     Breath sounds: Normal breath sounds.  Abdominal:     General: Bowel sounds are normal.     Palpations: Abdomen is soft.  Skin:    General: Skin is warm and dry.  Neurological:     Mental Status: She is alert and oriented to person, place, and time.     CMP Latest Ref Rng & Units 09/28/2020  Glucose 70 - 99 mg/dL 97  BUN 8 - 23 mg/dL 13  Creatinine 0.44 - 1.00 mg/dL 0.84  Sodium 135 - 145 mmol/L 136  Potassium 3.5 - 5.1 mmol/L 4.0  Chloride 98 - 111 mmol/L 102  CO2 22 - 32 mmol/L 24  Calcium 8.9 - 10.3 mg/dL 8.7(L)  Total Protein 6.5 - 8.1 g/dL 6.6  Total Bilirubin 0.3 - 1.2 mg/dL 0.9  Alkaline Phos 38 - 126 U/L 60  AST 15 - 41 U/L 26  ALT 0 - 44 U/L 18   CBC Latest Ref Rng & Units 09/28/2020  WBC 4.0 - 10.5 K/uL 7.1  Hemoglobin 12.0 - 15.0 g/dL 12.7  Hematocrit 36.0 - 46.0 % 39.7  Platelets 150 - 400 K/uL 224    No images are attached to the encounter.  NM Bone Scan Whole Body  Result Date: 09/24/2020 CLINICAL DATA:  Metastatic breast cancer, left groin and left knee pain EXAM: NUCLEAR MEDICINE WHOLE BODY BONE SCAN TECHNIQUE: Whole body anterior and posterior images were obtained approximately 3 hours after intravenous injection of radiopharmaceutical. RADIOPHARMACEUTICALS:  23.85 mCi Technetium-33mMDP IV COMPARISON:  Whole-body bone scan dated 08/02/2018 FINDINGS: Suspected degenerative uptake in the left upper cervical spine, unchanged. Suspected degenerative changes involving the bilateral shoulders, right greater than left. Suspected degenerative uptake in the sternum, unchanged, likely corresponding to the sternomanubrial joint on recent CT. Photopenia corresponding to the patient's known left hip arthroplasty. Suspected  degenerative changes involving the left knee, unchanged. No abnormal accumulation of radiotracer within the axillary or appendicular skeleton to suggest skeletal metastases. IMPRESSION: No scintigraphic evidence of skeletal metastases. Stable degenerative findings, as above. Electronically Signed   By: SJulian HyM.D.   On: 09/24/2020 19:44   CT CHEST ABDOMEN PELVIS W CONTRAST  Result Date: 09/24/2020 CLINICAL DATA:  Follow-up metastatic breast cancer EXAM: CT CHEST, ABDOMEN, AND PELVIS WITH CONTRAST TECHNIQUE: Multidetector CT imaging of the chest, abdomen and pelvis was performed following the standard protocol during bolus administration of intravenous contrast. CONTRAST:  752mOMNIPAQUE IOHEXOL 300 MG/ML  SOLN COMPARISON:  03/24/2020 FINDINGS: CT CHEST FINDINGS Cardiovascular: No evidence of thoracic aortic aneurysm. Mild atherosclerotic calcifications of the aortic arch. The heart is normal in size. No pericardial effusion. Mitral valve annular calcifications. Mediastinum/Nodes: No suspicious mediastinal, hilar, or axillary lymphadenopathy. Status post right axillary lymph node dissection. Lungs/Pleura: Mild biapical pleural-parenchymal scarring. No suspicious pulmonary nodules. No focal consolidation. Mild linear scarring/atelectasis in the left lower lobe. No pleural effusion or pneumothorax. Musculoskeletal: Status post right mastectomy. Mild degenerative spurring of the anterior sternomanubrial joint (sagittal image 104), corresponding to sclerosis on axial imaging (series 2/image 19), unchanged from the prior. Degenerative changes of the mid/lower thoracic spine. CT ABDOMEN PELVIS FINDINGS Hepatobiliary: Liver is within normal limits. No suspicious/enhancing hepatic lesions. Status post cholecystectomy. No intrahepatic or extrahepatic ductal dilatation. Pancreas: Within normal limits.  Spleen: Within normal limits. Adrenals/Urinary Tract: Adrenal glands are within normal limits. Kidneys are  within normal limits.  No hydronephrosis. Bladder is partially obscured by streak artifact. Suspected postprocedural changes along the bladder neck (series 2/image 118). Stomach/Bowel: Stomach is notable for a small hiatal hernia. No evidence of bowel obstruction. Status post right hemicolectomy with appendectomy. Vascular/Lymphatic: No evidence of abdominal aortic aneurysm. Atherosclerotic calcifications of the abdominal aorta and branch vessels. No suspicious abdominopelvic lymphadenopathy. Reproductive: Status post hysterectomy. Left ovary is within normal limits.  No right adnexal mass. Other: No abdominopelvic ascites. Musculoskeletal: Prior vertebral augmentation at L3. Left hip arthroplasty. No focal osseous lesions. IMPRESSION: Status post right mastectomy with right axillary lymph node dissection. No findings specific for recurrent or metastatic disease. Interval vertebral augmentation at L3. Otherwise stable ancillary and postsurgical changes, as above. Electronically Signed   By: Julian Hy M.D.   On: 09/24/2020 19:40     Assessment and plan- Patient is a 79 y.o. female with metastatic ER positive breast cancer on letrozole and this is a routine follow-up visit and to discuss CT scan results   I have reviewed CT chest abdomen and pelvis images independently as well as bone scan and discussed findings with the patient Which presently does not reveal any gross evidence of recurrent or metastatic disease.  No evidence of pleural or pulmonary nodules.  No evidence of mediastinal hilar or axillary adenopathy.  She had retrocrural lymph nodes as well as pleural-based nodules which were previously positive for metastatic breast cancer and we are not seeing those findings on present scan.  She will continue letrozole along with calcium and vitamin D at this time.  Patient had her last bone density scan in July 2019 which showed osteopenia which did not require any adjuvant bisphosphonates.  I will  plan on repeating her bone density scan at this time and see her back in 6 months with labs  Patient has chronic joint pain from arthritis which has been ongoing even prior to the start of letrozole for which she sees orthopedics.  Continue to monitor   Visit Diagnosis 1. Metastatic breast cancer ([place])   2. Use of letrozole ([place])      Dr. Randa Evens, MD, MPH [facility] [phone] 09/28/2020 6:47 PM

## 2020-09-29 ENCOUNTER — Ambulatory Visit
Admission: RE | Admit: 2020-09-29 | Discharge: 2020-09-29 | Disposition: A | Discharge status: Home / Self Care | Payer: PPO | Source: Ambulatory Visit | Attending: Internal Medicine | Admitting: Internal Medicine

## 2020-09-29 ENCOUNTER — Other Ambulatory Visit: Payer: Self-pay

## 2020-09-29 DIAGNOSIS — R29898 Other symptoms and signs involving the musculoskeletal system: Secondary | ICD-10-CM

## 2020-09-29 DIAGNOSIS — M545 Low back pain, unspecified: Secondary | ICD-10-CM | POA: Diagnosis not present

## 2020-09-29 DIAGNOSIS — M5136 Other intervertebral disc degeneration, lumbar region: Secondary | ICD-10-CM

## 2020-09-29 LAB — CANCER ANTIGEN 15-3: CA 15-3: 25.1 U/mL — ABNORMAL HIGH (ref 0.0–25.0)

## 2020-10-04 DIAGNOSIS — M1711 Unilateral primary osteoarthritis, right knee: Secondary | ICD-10-CM | POA: Diagnosis not present

## 2020-10-04 DIAGNOSIS — M170 Bilateral primary osteoarthritis of knee: Secondary | ICD-10-CM | POA: Diagnosis not present

## 2020-10-04 DIAGNOSIS — M1712 Unilateral primary osteoarthritis, left knee: Secondary | ICD-10-CM | POA: Diagnosis not present

## 2020-10-11 DIAGNOSIS — R399 Unspecified symptoms and signs involving the genitourinary system: Secondary | ICD-10-CM | POA: Diagnosis not present

## 2020-10-19 ENCOUNTER — Ambulatory Visit
Admission: RE | Admit: 2020-10-19 | Discharge: 2020-10-19 | Disposition: A | Discharge status: Home / Self Care | Payer: PPO | Source: Ambulatory Visit | Attending: Oncology | Admitting: Oncology

## 2020-10-19 ENCOUNTER — Other Ambulatory Visit: Payer: Self-pay

## 2020-10-19 DIAGNOSIS — C50919 Malignant neoplasm of unspecified site of unspecified female breast: Secondary | ICD-10-CM | POA: Insufficient documentation

## 2020-10-20 ENCOUNTER — Ambulatory Visit: Payer: Self-pay | Admitting: Urology

## 2020-10-20 ENCOUNTER — Ambulatory Visit (INDEPENDENT_AMBULATORY_CARE_PROVIDER_SITE_OTHER): Payer: PPO | Admitting: Physician Assistant

## 2020-10-20 VITALS — BP 133/69 | HR 85 | Ht 66.0 in | Wt 168.0 lb

## 2020-10-20 DIAGNOSIS — N3281 Overactive bladder: Secondary | ICD-10-CM

## 2020-10-20 DIAGNOSIS — N390 Urinary tract infection, site not specified: Secondary | ICD-10-CM | POA: Diagnosis not present

## 2020-10-20 DIAGNOSIS — N952 Postmenopausal atrophic vaginitis: Secondary | ICD-10-CM

## 2020-10-20 LAB — BLADDER SCAN AMB NON-IMAGING

## 2020-10-20 NOTE — Progress Notes (Signed)
10/20/2020 1:30 PM   Breanna Zhang 08/13/1940 [id number]  CC: Chief Complaint  Patient presents with   Over Active Bladder   Follow-up    HPI: Breanna Zhang is a 80 y.o. 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   Today she reports her urinary symptoms are stable on Myrbetriq and she is pleased with her progress on this medication, however she is entered the donut hole for coverage and it is now costing her $100 per month on a fixed income.  She wonders if there are cheaper alternatives for her to try.  Notably, she reports dry mouth and confusion at baseline.  PVR 144mL.  She was prescribed Macrobid by her PCP 9 days ago for possible UTI with symptoms including dysuria and back/side pain.  UA was notable for trace leukocyte esterase and rare bacteria, and urine culture finalized with no growth.  Today she states her symptoms slightly improved on antibiotics, but did not completely resolve.  She states her symptoms are primarily vulvar burning with urination.  She underwent CTAP with contrast on 09/23/2020 with no evident urolithiasis.  PMH: Past Medical History:  Diagnosis Date   Anxiety    Atrial fibrillation ([place])    Breast cancer ([place])    Breast cancer, right ([place]) 1999   RT MASTECTOMY and chemo tx's.    DDD (degenerative disc disease), cervical    hands and back with arthritis   Depression    Diverticulosis    Diverticulosis    DVT of lower extremity (deep venous thrombosis) (HCC)    Dyspnea    Family history of colon cancer    Family history of lung cancer    Family history of throat cancer    GERD (gastroesophageal reflux disease)    Goiter    Headache    History of chemotherapy 2000   BREAST CA   Hypertension    Incontinence    Neuromuscular disorder ([place])    peipheral neuropathy   OSA (obstructive sleep apnea)     Osteoporosis    Personal history of chemotherapy 1999   BREAST CA   Status post chemotherapy    2000 right breast cancer   Thyroid disease    UTI (urinary tract infection)    Vertigo     Surgical History: Past Surgical History:  Procedure Laterality Date   ABDOMINAL HYSTERECTOMY     BREAST BIOPSY Left 2006   negative   BUNIONECTOMY Bilateral    Screws implanted.   CHOLECYSTECTOMY     COCHLEAR IMPLANT Right    COLONOSCOPY WITH PROPOFOL N/A 07/24/2016   Procedure: COLONOSCOPY WITH PROPOFOL;  Surgeon: Manya Silvas, MD;  Location: [facility] ENDOSCOPY;  Service: Endoscopy;  Laterality: N/A;   ESOPHAGOGASTRODUODENOSCOPY (EGD) WITH PROPOFOL N/A 04/12/2016   Procedure: ESOPHAGOGASTRODUODENOSCOPY (EGD) WITH PROPOFOL;  Surgeon: Manya Silvas, MD;  Location: [facility] ENDOSCOPY;  Service: Endoscopy;  Laterality: N/A;   EYE SURGERY Bilateral    cataract   FOOT SURGERY Bilateral    HAND SURGERY Right    KYPHOPLASTY N/A 05/20/2020   Procedure: L3 KYPHOPLASTY;  Surgeon: Hessie Knows, MD;  Location: [facility] ORS;  Service: Orthopedics;  Laterality: N/A;   MASTECTOMY Right 1999   BREAST CA   MASTECTOMY Right 1999   PARTIAL COLECTOMY     THYROIDECTOMY     TOTAL HIP ARTHROPLASTY Left 03/19/2018  Procedure: TOTAL HIP ARTHROPLASTY LEFT;  Surgeon: Hessie Knows, MD;  Location: [facility] ORS;  Service: Orthopedics;  Laterality: Left;   VIDEO ASSISTED THORACOSCOPY Left 09/10/2017   Procedure: VIDEO ASSISTED THORACOSCOPY;  Surgeon: [name], MD;  Location: [facility] ORS;  Service: Thoracic;  Laterality: Left;  with biopsies   VIDEO BRONCHOSCOPY  09/10/2017   Procedure: VIDEO BRONCHOSCOPY;  Surgeon: [name], MD;  Location: [facility] ORS;  Service: Thoracic;;    Home Medications:  Allergies as of 10/20/2020       Reactions   Imipramine Tinitus   Latex Rash   Penicillins Rash   Tape Rash        Medication List        Accurate as of October 20, 2020  1:30 PM. If you have any questions, ask your nurse  or doctor.          acetaminophen 325 MG tablet Commonly known as: TYLENOL Take 650 mg by mouth every 4 (four) hours as needed. For pain / increased temp.  May be administered orally,  per G-Tube if needed or rectally if unable to swallow (separate order).  Maximum dose for 24 hours is 3,000 mg from all sources of Acetaminophen / Tylenol   atorvastatin 40 MG tablet Commonly known as: LIPITOR Take 1 tablet (40 mg total) by mouth at bedtime.   Baclofen 5 MG Tabs Take 1 tablet by mouth 2 (two) times daily.   busPIRone 10 MG tablet Commonly known as: BUSPAR TAKE 1 TABLET BY MOUTH TWICE A DAY   cholecalciferol 1000 units tablet Commonly known as: VITAMIN D Take 1,000 Units by mouth daily.   DULoxetine 60 MG capsule Commonly known as: CYMBALTA TAKE 1 CAPSULE BY MOUTH EVERY MORNING   gabapentin 300 MG capsule Commonly known as: NEURONTIN Take 300 mg by mouth 2 (two) times daily.   HYDROcodone-acetaminophen 5-325 MG tablet Commonly known as: NORCO/VICODIN Take by mouth.   letrozole 2.5 MG tablet Commonly known as: FEMARA TAKE 1 TABLET BY MOUTH EVERY DAY   levothyroxine 100 MCG tablet Commonly known as: SYNTHROID Take 1 tablet by mouth daily.   mirabegron ER 50 MG Tb24 tablet Commonly known as: MYRBETRIQ Take 1 tablet (50 mg total) by mouth daily.   Moderna COVID-19 Vaccine 100 MCG/0.5ML injection Generic drug: COVID-19 mRNA vaccine (Moderna)   ondansetron 4 MG tablet Commonly known as: ZOFRAN Take 1 tablet (4 mg total) by mouth every 6 (six) hours as needed for nausea or vomiting.   oxyCODONE-acetaminophen 5-325 MG tablet Commonly known as: Percocet Take 1 tablet by mouth every 4 (four) hours as needed for severe pain.   pantoprazole 40 MG tablet Commonly known as: PROTONIX Take 1 tablet (40 mg total) by mouth 2 (two) times daily.   predniSONE 10 MG tablet Commonly known as: DELTASONE PLEASE SEE ATTACHED FOR DETAILED DIRECTIONS   traZODone 100 MG  tablet Commonly known as: DESYREL TAKE 1 TABLET BY MOUTH EVERYDAY AT BEDTIME   vitamin B-12 500 MCG tablet Commonly known as: CYANOCOBALAMIN Take 500 mcg by mouth daily.        Allergies:  Allergies  Allergen Reactions   Imipramine Tinitus   Latex Rash   Penicillins Rash   Tape Rash    Family History: Family History  Problem Relation Age of Onset   Dementia Mother    Dementia Sister    Diabetes Sister    Heart attack Brother 34   COPD Brother    Lung cancer Brother  hx smoking   Colon cancer Brother    Liver cancer Brother    COPD Brother    Lung cancer Brother        hx smoking   Colon cancer Maternal Grandfather        dx >.50   Breast cancer Neg Hx     Social History:   reports that she has never smoked. She has never used smokeless tobacco. She reports that she does not drink alcohol and does not use drugs.  Physical Exam: BP 133/69   Pulse 85   Ht 5\' 6"  (1.676 m)   Wt 168 lb (76.2 kg)   BMI 27.12 kg/m   Constitutional:  Alert and oriented, no acute distress, nontoxic appearing HEENT: [place], AT Cardiovascular: No clubbing, cyanosis, or edema Respiratory: Normal respiratory effort, no increased work of breathing Skin: No rashes, bruises or suspicious lesions Neurologic: Grossly intact, no focal deficits, moving all 4 extremities Psychiatric: Normal mood and affect  Laboratory Data: Results for orders placed or performed in visit on 10/20/20  Bladder Scan (Post Void Residual) in office  Result Value Ref Range   Scan Result 173mL    Assessment & Plan:   1. OAB (overactive bladder) Symptoms stably improved on Myrbetriq, however this medication is becoming cost prohibitive.  We discussed alternative treatment options today including anticholinergic medications and PTNS.  She very appropriately does not wish to pursue anticholinergic medications due to her existing confusion at baseline.  Alternatively, I offered her samples of Myrbetriq, but  explained that I may not be able to do this consistently.  She expressed understanding and accepted the samples, stating she would consider PTNS in the future. - Bladder Scan (Post Void Residual) in office  2. Recurrent UTI No positive culture data from within the past year.  I suspect her recent "UTI" was actually vulvar burning associated with her vaginal atrophy as below.  3. Vaginal atrophy Patient poorly tolerated estrogen cream in the past and has concerns about increased cancer risk.  I suggested she try nonhormonal vaginal moisturizers as an alternative.  Return in about 1 year (around 10/20/2021) for Symptom recheck with PVR.  Debroah Loop, PA-C  [facility] [address], [place] [address] [phone]

## 2020-10-20 NOTE — Patient Instructions (Signed)
Please try the over-the-counter vaginal moisturizer called Replens to help with your vaginal burning. It is available in the feminine care aisle at the pharmacy.

## 2020-11-01 DIAGNOSIS — H353221 Exudative age-related macular degeneration, left eye, with active choroidal neovascularization: Secondary | ICD-10-CM | POA: Diagnosis not present

## 2020-11-02 ENCOUNTER — Telehealth (HOSPITAL_COMMUNITY): Payer: Self-pay | Admitting: *Deleted

## 2020-11-02 ENCOUNTER — Other Ambulatory Visit (HOSPITAL_COMMUNITY): Payer: Self-pay | Admitting: Psychiatry

## 2020-11-02 DIAGNOSIS — F3342 Major depressive disorder, recurrent, in full remission: Secondary | ICD-10-CM

## 2020-11-02 NOTE — Telephone Encounter (Signed)
Her pharmacy is reaching out to [place] to fill a rx for her Buspar, She has not been seen in 9 months and should have been out of meds the end of June. Dr Toy Care had been seeing her but she left this clinic in July. She is a resident of [place]. I will refer her [facility].

## 2020-11-03 ENCOUNTER — Other Ambulatory Visit (HOSPITAL_COMMUNITY): Payer: Self-pay | Admitting: Psychiatry

## 2020-11-03 ENCOUNTER — Telehealth (HOSPITAL_COMMUNITY): Payer: Self-pay | Admitting: *Deleted

## 2020-11-03 DIAGNOSIS — F3342 Major depressive disorder, recurrent, in full remission: Secondary | ICD-10-CM

## 2020-11-03 DIAGNOSIS — F4310 Post-traumatic stress disorder, unspecified: Secondary | ICD-10-CM

## 2020-11-03 NOTE — Telephone Encounter (Signed)
Writer received a fax request from pharmacy for Duloxetine for her. Tried to schedule her with the [place] office as she is a Regulatory affairs officer and Dr Toy Care is no longer at this practice. Someone in that office contacted her about the change and she said she wasn't sure she wanted to make the change, she would consider it and get back to them. For now no rx is being offered to her from this office as she doesn't have anything scheduled and hasnt been seen since Nov 2021.

## 2020-11-10 ENCOUNTER — Other Ambulatory Visit: Payer: Self-pay | Admitting: Urology

## 2020-11-10 DIAGNOSIS — N390 Urinary tract infection, site not specified: Secondary | ICD-10-CM

## 2020-11-11 DIAGNOSIS — H353211 Exudative age-related macular degeneration, right eye, with active choroidal neovascularization: Secondary | ICD-10-CM | POA: Diagnosis not present

## 2020-11-17 ENCOUNTER — Telehealth (INDEPENDENT_AMBULATORY_CARE_PROVIDER_SITE_OTHER): Payer: PPO | Admitting: Psychiatry

## 2020-11-17 ENCOUNTER — Encounter: Payer: Self-pay | Admitting: Psychiatry

## 2020-11-17 ENCOUNTER — Other Ambulatory Visit: Payer: Self-pay

## 2020-11-17 DIAGNOSIS — F4310 Post-traumatic stress disorder, unspecified: Secondary | ICD-10-CM | POA: Diagnosis not present

## 2020-11-17 DIAGNOSIS — G4700 Insomnia, unspecified: Secondary | ICD-10-CM

## 2020-11-17 DIAGNOSIS — F3342 Major depressive disorder, recurrent, in full remission: Secondary | ICD-10-CM | POA: Diagnosis not present

## 2020-11-17 MED ORDER — BUSPIRONE HCL 10 MG PO TABS: 10.0000 mg | ORAL_TABLET | Freq: Two times a day (BID) | ORAL | 0 refills | Status: AC

## 2020-11-17 MED ORDER — DULOXETINE HCL 60 MG PO CPEP: 60.0000 mg | ORAL_CAPSULE | Freq: Every morning | ORAL | 0 refills | Status: DC

## 2020-11-17 MED ORDER — TRAZODONE HCL 100 MG PO TABS: ORAL_TABLET | ORAL | 0 refills | Status: DC

## 2020-11-17 NOTE — Progress Notes (Signed)
Virtual Visit via Telephone Note  I connected with Breanna Zhang on 11/17/20 at  3:00 PM EDT by telephone and verified that I am speaking with the correct person using two identifiers.  Location: Patient: home Provider: office Persons participated in the visit- patient, provider    I discussed the limitations, risks, security and privacy concerns of performing an evaluation and management service by telephone and the availability of in person appointments. I also discussed with the patient that there may be a patient responsible charge related to this service. The patient expressed understanding and agreed to proceed.     I discussed the assessment and treatment plan with the patient. The patient was provided an opportunity to ask questions and all were answered. The patient agreed with the plan and demonstrated an understanding of the instructions.   The patient was advised to call back or seek an in-person evaluation if the symptoms worsen or if the condition fails to improve as anticipated.  I provided 30 minutes of non-face-to-face time during this encounter.   Norman Clay, MD     [facility] MD/PA/NP OP Progress Note  11/17/2020 3:45 PM Breanna Zhang  MRN:  [id number]  Chief Complaint:  Chief Complaint   Follow-up; Depression    HPI:  Breanna Zhang is a 80 y.o. year old female with a history of depression, PTSD. metastatic ER positive breast cancer on letrozole s/p mastectomy, chemotherapy, DVT, hypertension ,OSA, s/p hysterectomy, , who is transferred from Dr. Toy Care.   She is not seen since July 2021. Her husband, and her daughter Di Kindle helps her for communication at times due to her hearing loss.  She states that she has been doing good since the last visit.  She states that she just made the appointment as a follow-up.  She believes she has been doing well on the current medication.  She occasionally feels depressed due to her medical condition.  She states that although she  used to be active, she cannot drive anymore as she has imbalance issues.  She also has back pain.  She feels weak in her leg as well.  She also talks about her son, who was sick in the hospital.  He is doing better lately.  She was feeling stress about these.  However, she enjoys sitting in front porch, listening to birds.  She likes to get out of the house.  She sleeps well with trazodone.  She denies anhedonia.  Although she feels fatigued at times, she has good energy most of the time.  She has occasional difficulty in concentration.  She denies SI.   PTSD-she was attacked by [place] few years ago.  She was bit on several places.  Although she used to have flashback, it has been getting better. She feels uncomfortable when she is around with a big dog.   ROS-she denies decreased need for sleep or euphonia.  She denies any hallucinations.  She denies alcohol use or drug use.   Medication- duloxetine 60 mg daily, buspar 10mg  twice aday, trazodone 100 mg at night, on gabapentin  Daily routine: Exercise: Employment:  Support:  husband, children Household: husband Marital status: married for 32 years Number of children: 4 from previous marriage    Visit Diagnosis:    ICD-10-CM   1. Insomnia, unspecified type  G47.00     2. MDD (major depressive disorder), recurrent, in full remission ([place])  F33.42 busPIRone (BUSPAR) 10 MG tablet    DULoxetine (CYMBALTA) 60 MG capsule  traZODone (DESYREL) 100 MG tablet    3. Post traumatic stress disorder (PTSD)  F43.10 DULoxetine (CYMBALTA) 60 MG capsule      Past Psychiatric History:  Outpatient: Dr. Bridgett Larsson, [facility]  Psychiatry admission: denies Previous suicide attempt: denies  Past trials of medication:  History of violence:    Past Medical History:  Past Medical History:  Diagnosis Date   Anxiety    Atrial fibrillation ([place])    Breast cancer ([place])    Breast cancer, right ([place]) 1999   RT MASTECTOMY and chemo tx's.    DDD  (degenerative disc disease), cervical    hands and back with arthritis   Depression    Diverticulosis    Diverticulosis    DVT of lower extremity (deep venous thrombosis) (HCC)    Dyspnea    Family history of colon cancer    Family history of lung cancer    Family history of throat cancer    GERD (gastroesophageal reflux disease)    Goiter    Headache    History of chemotherapy 2000   BREAST CA   Hypertension    Incontinence    Neuromuscular disorder ([place])    peipheral neuropathy   OSA (obstructive sleep apnea)    Osteoporosis    Personal history of chemotherapy 1999   BREAST CA   Status post chemotherapy    2000 right breast cancer   Thyroid disease    UTI (urinary tract infection)    Vertigo     Past Surgical History:  Procedure Laterality Date   ABDOMINAL HYSTERECTOMY     BREAST BIOPSY Left 2006   negative   BUNIONECTOMY Bilateral    Screws implanted.   CHOLECYSTECTOMY     COCHLEAR IMPLANT Right    COLONOSCOPY WITH PROPOFOL N/A 07/24/2016   Procedure: COLONOSCOPY WITH PROPOFOL;  Surgeon: Manya Silvas, MD;  Location[facility] ENDOSCOPY;  Service: Endoscopy;  Laterality: N/A;   ESOPHAGOGASTRODUODENOSCOPY (EGD) WITH PROPOFOL N/A 04/12/2016   Procedure: ESOPHAGOGASTRODUODENOSCOPY (EGD) WITH PROPOFOL;  Surgeon: Manya Silvas, MD;  Location: [facility] ENDOSCOPY;  Service: Endoscopy;  Laterality: N/A;   EYE SURGERY Bilateral    cataract   FOOT SURGERY Bilateral    HAND SURGERY Right    KYPHOPLASTY N/A 05/20/2020   Procedure: L3 KYPHOPLASTY;  Surgeon: Hessie Knows, MD;  Location: [facility] ORS;  Service: Orthopedics;  Laterality: N/A;   MASTECTOMY Right 1999   BREAST CA   MASTECTOMY Right 1999   PARTIAL COLECTOMY     THYROIDECTOMY     TOTAL HIP ARTHROPLASTY Left 03/19/2018   Procedure: TOTAL HIP ARTHROPLASTY LEFT;  Surgeon: Hessie Knows, MD;  Location: [facility] ORS;  Service: Orthopedics;  Laterality: Left;   VIDEO ASSISTED THORACOSCOPY Left 09/10/2017   Procedure: VIDEO ASSISTED  THORACOSCOPY;  Surgeon: [name], MD;  Location: [facility] ORS;  Service: Thoracic;  Laterality: Left;  with biopsies   VIDEO BRONCHOSCOPY  09/10/2017   Procedure: VIDEO BRONCHOSCOPY;  Surgeon: [name], MD;  Location: [facility] ORS;  Service: Thoracic;;    Family Psychiatric History: as below  Family History:  Family History  Problem Relation Age of Onset   Dementia Mother    Dementia Sister    Diabetes Sister    Heart attack Brother 38   COPD Brother    Lung cancer Brother        hx smoking   Colon cancer Brother    Liver cancer Brother    COPD Brother    Lung cancer Brother  hx smoking   Colon cancer Maternal Grandfather        dx >.50   Breast cancer Neg Hx     Social History:  Social History   Socioeconomic History   Marital status: Married    Spouse name: evert   Number of children: 4   Years of education: Not on file   Highest education level: 9th grade  Occupational History   Not on file  Tobacco Use   Smoking status: Never   Smokeless tobacco: Never  Vaping Use   Vaping Use: Never used  Substance and Sexual Activity   Alcohol use: No    Alcohol/week: 0.0 standard drinks   Drug use: No   Sexual activity: Not Currently  Other Topics Concern   Not on file  Social History Narrative   Not on file   Social Determinants of Health   Financial Resource Strain: Not on file  Food Insecurity: Not on file  Transportation Needs: Not on file  Physical Activity: Not on file  Stress: Not on file  Social Connections: Not on file    Allergies:  Allergies  Allergen Reactions   Imipramine Tinitus   Latex Rash   Penicillins Rash   Tape Rash    Metabolic Disorder Labs: No results found for: HGBA1C, MPG No results found for: PROLACTIN No results found for: CHOL, TRIG, HDL, CHOLHDL, VLDL, LDLCALC Lab Results  Component Value Date   TSH 0.275 (L) 11/09/2012   TSH 0.443 (L) 10/12/2012    Therapeutic Level Labs: No results found for: LITHIUM No  results found for: VALPROATE No components found for:  CBMZ  Current Medications: Current Outpatient Medications  Medication Sig Dispense Refill   acetaminophen (TYLENOL) 325 MG tablet Take 650 mg by mouth every 4 (four) hours as needed. For pain / increased temp.  May be administered orally,  per G-Tube if needed or rectally if unable to swallow (separate order).  Maximum dose for 24 hours is 3,000 mg from all sources of Acetaminophen / Tylenol     atorvastatin (LIPITOR) 40 MG tablet Take 1 tablet (40 mg total) by mouth at bedtime. 30 tablet 0   Baclofen 5 MG TABS Take 1 tablet by mouth 2 (two) times daily.     busPIRone (BUSPAR) 10 MG tablet Take 1 tablet (10 mg total) by mouth 2 (two) times daily. 180 tablet 0   cholecalciferol (VITAMIN D) 1000 units tablet Take 1,000 Units by mouth daily.     DULoxetine (CYMBALTA) 60 MG capsule Take 1 capsule (60 mg total) by mouth every morning. 90 capsule 0   gabapentin (NEURONTIN) 300 MG capsule Take 300 mg by mouth 2 (two) times daily.     HYDROcodone-acetaminophen (NORCO/VICODIN) 5-325 MG tablet Take by mouth.     letrozole (FEMARA) 2.5 MG tablet TAKE 1 TABLET BY MOUTH EVERY DAY 90 tablet 1   levothyroxine (SYNTHROID) 100 MCG tablet Take 1 tablet by mouth daily.     MODERNA COVID-19 VACCINE 100 MCG/0.5ML injection      MYRBETRIQ 50 MG TB24 tablet TAKE 1 TABLET BY MOUTH EVERY DAY 30 tablet 11   ondansetron (ZOFRAN) 4 MG tablet Take 1 tablet (4 mg total) by mouth every 6 (six) hours as needed for nausea or vomiting. 30 tablet 1   oxyCODONE-acetaminophen (PERCOCET) 5-325 MG tablet Take 1 tablet by mouth every 4 (four) hours as needed for severe pain. (Patient not taking: Reported on 10/20/2020) 20 tablet 0   pantoprazole (PROTONIX) 40 MG tablet  Take 1 tablet (40 mg total) by mouth 2 (two) times daily. 60 tablet 0   predniSONE (DELTASONE) 10 MG tablet PLEASE SEE ATTACHED FOR DETAILED DIRECTIONS (Patient not taking: Reported on 10/20/2020)     [START ON  12/22/2020] traZODone (DESYREL) 100 MG tablet TAKE 1 TABLET BY MOUTH EVERYDAY AT BEDTIME 90 tablet 0   vitamin B-12 (CYANOCOBALAMIN) 500 MCG tablet Take 500 mcg by mouth daily.      No current facility-administered medications for this visit.     Musculoskeletal: Strength & Muscle Tone:  N/A Gait & Station:  N/A Patient leans: N/A  Psychiatric Specialty Exam: Review of Systems  Psychiatric/Behavioral:  Positive for decreased concentration, dysphoric mood and sleep disturbance. Negative for agitation, behavioral problems, confusion, hallucinations, self-injury and suicidal ideas. The patient is nervous/anxious. The patient is not hyperactive.   All other systems reviewed and are negative.  There were no vitals taken for this visit.There is no height or weight on file to calculate BMI.  General Appearance: NA  Eye Contact:  NA  Speech:  Clear and Coherent  Volume:  Normal  Mood:   good  Affect:  NA  Thought Process:  Coherent  Orientation:  Full (Time, Place, and Person)  Thought Content: Logical   Suicidal Thoughts:  No  Homicidal Thoughts:  No  Memory:  Immediate;   Good  Judgement:  Good  Insight:  Good  Psychomotor Activity:  Normal  Concentration:  Concentration: Good and Attention Span: Good  Recall:  Good  Fund of Knowledge: Good  Language: Good  Akathisia:  No  Handed:  Right  AIMS (if indicated): not done  Assets:  Communication Skills Desire for Improvement  ADL's:  Intact  Cognition: WNL  Sleep:  Fair   Screenings: Flowsheet Row Video Visit from 11/17/2020 in [place] Admission (Discharged) from 05/20/2020 in [place] No Risk No Risk        Assessment and Plan:  Breanna Zhang is a 80 y.o. year old female with a history of depression, PTSD. metastatic ER positive breast cancer on letrozole s/p mastectomy, chemotherapy, DVT, hypertension ,OSA, s/p hysterectomy, ,  who is transferred from Dr. Toy Care.   1. MDD (major depressive disorder), recurrent, in full remission ([place]) 2. Post traumatic stress disorder (PTSD) (being attacked from a dog) Although she reports occasional depressed mood due to demoralization due to her medical condition including pain, she enjoys interaction with her family, and denies any concerns at this time.  Will continue current dose of duloxetine to target depression and anxiety.  Will continue BuSpar for anxiety.   # Insomnia She reports good benefit from trazodone.  Will continue current dose to target insomnia.   Plan Continue duloxetine 60 mg daily Continue BuSpar 10 mg twice a day Continue trazodone 100 mg at night as needed for insomnia Next appointment 12/2 at 9 AM, video  The patient demonstrates the following risk factors for suicide: Chronic risk factors for suicide include: psychiatric disorder of depression, anxiety and chronic pain. Acute risk factors for suicide include: N/A. Protective factors for this patient include: positive social support, coping skills, and hope for the future. Considering these factors, the overall suicide risk at this point appears to be low. Patient is appropriate for outpatient follow up.   Norman Clay, MD 11/17/2020, 3:45 PM

## 2020-11-17 NOTE — Patient Instructions (Signed)
Continue duloxetine 60 mg daily Continue BuSpar 10 mg twice a day Continue trazodone 100 mg at night as needed for insomnia Next appointment 12/2 at 9 AM

## 2020-11-18 DIAGNOSIS — I10 Essential (primary) hypertension: Secondary | ICD-10-CM | POA: Diagnosis not present

## 2020-11-18 DIAGNOSIS — K219 Gastro-esophageal reflux disease without esophagitis: Secondary | ICD-10-CM | POA: Diagnosis not present

## 2020-11-18 DIAGNOSIS — M5136 Other intervertebral disc degeneration, lumbar region: Secondary | ICD-10-CM | POA: Diagnosis not present

## 2020-11-18 DIAGNOSIS — E785 Hyperlipidemia, unspecified: Secondary | ICD-10-CM | POA: Diagnosis not present

## 2020-11-18 DIAGNOSIS — E034 Atrophy of thyroid (acquired): Secondary | ICD-10-CM | POA: Diagnosis not present

## 2020-11-18 DIAGNOSIS — R739 Hyperglycemia, unspecified: Secondary | ICD-10-CM | POA: Diagnosis not present

## 2020-11-25 ENCOUNTER — Other Ambulatory Visit (HOSPITAL_COMMUNITY): Payer: Self-pay | Admitting: Internal Medicine

## 2020-11-25 ENCOUNTER — Other Ambulatory Visit: Payer: Self-pay | Admitting: Internal Medicine

## 2020-11-25 DIAGNOSIS — I10 Essential (primary) hypertension: Secondary | ICD-10-CM | POA: Diagnosis not present

## 2020-11-25 DIAGNOSIS — C50919 Malignant neoplasm of unspecified site of unspecified female breast: Secondary | ICD-10-CM | POA: Diagnosis not present

## 2020-11-25 DIAGNOSIS — E785 Hyperlipidemia, unspecified: Secondary | ICD-10-CM | POA: Diagnosis not present

## 2020-11-25 DIAGNOSIS — R739 Hyperglycemia, unspecified: Secondary | ICD-10-CM | POA: Diagnosis not present

## 2020-11-25 DIAGNOSIS — Z0000 Encounter for general adult medical examination without abnormal findings: Secondary | ICD-10-CM | POA: Diagnosis not present

## 2020-11-25 DIAGNOSIS — F3342 Major depressive disorder, recurrent, in full remission: Secondary | ICD-10-CM | POA: Diagnosis not present

## 2020-11-25 DIAGNOSIS — C7802 Secondary malignant neoplasm of left lung: Secondary | ICD-10-CM | POA: Diagnosis not present

## 2020-11-25 DIAGNOSIS — G4733 Obstructive sleep apnea (adult) (pediatric): Secondary | ICD-10-CM | POA: Diagnosis not present

## 2020-11-25 DIAGNOSIS — K219 Gastro-esophageal reflux disease without esophagitis: Secondary | ICD-10-CM | POA: Diagnosis not present

## 2020-11-25 DIAGNOSIS — R1319 Other dysphagia: Secondary | ICD-10-CM

## 2020-11-25 DIAGNOSIS — T17320A Food in larynx causing asphyxiation, initial encounter: Secondary | ICD-10-CM

## 2020-11-25 DIAGNOSIS — E034 Atrophy of thyroid (acquired): Secondary | ICD-10-CM | POA: Diagnosis not present

## 2020-11-25 DIAGNOSIS — M5416 Radiculopathy, lumbar region: Secondary | ICD-10-CM | POA: Diagnosis not present

## 2020-11-25 DIAGNOSIS — I700 Atherosclerosis of aorta: Secondary | ICD-10-CM | POA: Diagnosis not present

## 2020-12-01 ENCOUNTER — Ambulatory Visit
Admission: RE | Admit: 2020-12-01 | Discharge: 2020-12-01 | Disposition: A | Discharge status: Home / Self Care | Payer: PPO | Source: Ambulatory Visit | Attending: Internal Medicine | Admitting: Internal Medicine

## 2020-12-01 DIAGNOSIS — T17320A Food in larynx causing asphyxiation, initial encounter: Secondary | ICD-10-CM | POA: Diagnosis not present

## 2020-12-01 DIAGNOSIS — K449 Diaphragmatic hernia without obstruction or gangrene: Secondary | ICD-10-CM | POA: Diagnosis not present

## 2020-12-01 DIAGNOSIS — K219 Gastro-esophageal reflux disease without esophagitis: Secondary | ICD-10-CM | POA: Diagnosis not present

## 2020-12-01 DIAGNOSIS — R1319 Other dysphagia: Secondary | ICD-10-CM | POA: Diagnosis not present

## 2020-12-13 DIAGNOSIS — D225 Melanocytic nevi of trunk: Secondary | ICD-10-CM | POA: Diagnosis not present

## 2020-12-13 DIAGNOSIS — L821 Other seborrheic keratosis: Secondary | ICD-10-CM | POA: Diagnosis not present

## 2020-12-13 DIAGNOSIS — L7211 Pilar cyst: Secondary | ICD-10-CM | POA: Diagnosis not present

## 2020-12-13 DIAGNOSIS — D2272 Melanocytic nevi of left lower limb, including hip: Secondary | ICD-10-CM | POA: Diagnosis not present

## 2020-12-13 DIAGNOSIS — D2271 Melanocytic nevi of right lower limb, including hip: Secondary | ICD-10-CM | POA: Diagnosis not present

## 2020-12-13 DIAGNOSIS — D2262 Melanocytic nevi of left upper limb, including shoulder: Secondary | ICD-10-CM | POA: Diagnosis not present

## 2020-12-13 DIAGNOSIS — D485 Neoplasm of uncertain behavior of skin: Secondary | ICD-10-CM | POA: Diagnosis not present

## 2020-12-13 DIAGNOSIS — D2261 Melanocytic nevi of right upper limb, including shoulder: Secondary | ICD-10-CM | POA: Diagnosis not present

## 2020-12-22 DIAGNOSIS — M5136 Other intervertebral disc degeneration, lumbar region: Secondary | ICD-10-CM | POA: Diagnosis not present

## 2020-12-22 DIAGNOSIS — M5416 Radiculopathy, lumbar region: Secondary | ICD-10-CM | POA: Diagnosis not present

## 2020-12-22 DIAGNOSIS — Z8781 Personal history of (healed) traumatic fracture: Secondary | ICD-10-CM | POA: Diagnosis not present

## 2020-12-22 DIAGNOSIS — M48062 Spinal stenosis, lumbar region with neurogenic claudication: Secondary | ICD-10-CM | POA: Diagnosis not present

## 2021-01-06 DIAGNOSIS — H903 Sensorineural hearing loss, bilateral: Secondary | ICD-10-CM | POA: Diagnosis not present

## 2021-01-25 DIAGNOSIS — F3342 Major depressive disorder, recurrent, in full remission: Secondary | ICD-10-CM | POA: Diagnosis not present

## 2021-01-25 DIAGNOSIS — M159 Polyosteoarthritis, unspecified: Secondary | ICD-10-CM | POA: Diagnosis not present

## 2021-01-25 DIAGNOSIS — I700 Atherosclerosis of aorta: Secondary | ICD-10-CM | POA: Diagnosis not present

## 2021-01-25 DIAGNOSIS — M542 Cervicalgia: Secondary | ICD-10-CM | POA: Diagnosis not present

## 2021-01-25 DIAGNOSIS — C50919 Malignant neoplasm of unspecified site of unspecified female breast: Secondary | ICD-10-CM | POA: Diagnosis not present

## 2021-01-25 DIAGNOSIS — C7802 Secondary malignant neoplasm of left lung: Secondary | ICD-10-CM | POA: Diagnosis not present

## 2021-01-27 DIAGNOSIS — H04221 Epiphora due to insufficient drainage, right lacrimal gland: Secondary | ICD-10-CM | POA: Diagnosis not present

## 2021-01-27 DIAGNOSIS — H353221 Exudative age-related macular degeneration, left eye, with active choroidal neovascularization: Secondary | ICD-10-CM | POA: Diagnosis not present

## 2021-02-02 DIAGNOSIS — J069 Acute upper respiratory infection, unspecified: Secondary | ICD-10-CM | POA: Diagnosis not present

## 2021-02-02 DIAGNOSIS — J40 Bronchitis, not specified as acute or chronic: Secondary | ICD-10-CM | POA: Diagnosis not present

## 2021-02-09 NOTE — Progress Notes (Deleted)
[place] MD/PA/NP OP Progress Note  02/09/2021 1:37 PM Breanna Zhang  MRN:  [id number]  Chief Complaint:  HPI: *** Visit Diagnosis: No diagnosis found.  Past Psychiatric History: Please see initial evaluation for full details. I have reviewed the history. No updates at this time.     Past Medical History:  Past Medical History:  Diagnosis Date   Anxiety    Atrial fibrillation ([place])    Breast cancer ([place])    Breast cancer, right ([place]) 1999   RT MASTECTOMY and chemo tx's.    DDD (degenerative disc disease), cervical    hands and back with arthritis   Depression    Diverticulosis    Diverticulosis    DVT of lower extremity (deep venous thrombosis) (H[facility])    Dyspnea    Family history of colon cancer    Family history of lung cancer    Family history of throat cancer    GERD (gastroesophageal reflux disease)    Goiter    Headache    History of chemotherapy 2000   BREAST CA   Hypertension    Incontinence    Neuromuscular disorder ([place])    peipheral neuropathy   OSA (obstructive sleep apnea)    Osteoporosis    Personal history of chemotherapy 1999   BREAST CA   Status post chemotherapy    2000 right breast cancer   Thyroid disease    UTI (urinary tract infection)    Vertigo     Past Surgical History:  Procedure Laterality Date   ABDOMINAL HYSTERECTOMY     BREAST BIOPSY Left 2006   negative   BUNIONECTOMY Bilateral    Screws implanted.   CHOLECYSTECTOMY     COCHLEAR IMPLANT Right    COLONOSCOPY WITH PROPOFOL N/A 07/24/2016   Procedure: COLONOSCOPY WITH PROPOFOL;  Surgeon: Manya Silvas, MD;  Location: [facility] ENDOSCOPY;  Service: Endoscopy;  Laterality: N/A;   ESOPHAGOGASTRODUODENOSCOPY (EGD) WITH PROPOFOL N/A 04/12/2016   Procedure: ESOPHAGOGASTRODUODENOSCOPY (EGD) WITH PROPOFOL;  Surgeon: Manya Silvas, MD;  Location: [facility] ENDOSCOPY;  Service: Endoscopy;  Laterality: N/A;   EYE SURGERY Bilateral    cataract   FOOT SURGERY Bilateral    HAND SURGERY Right     KYPHOPLASTY N/A 05/20/2020   Procedure: L3 KYPHOPLASTY;  Surgeon: Hessie Knows, MD;  Location: [facility] ORS;  Service: Orthopedics;  Laterality: N/A;   MASTECTOMY Right 1999   BREAST CA   MASTECTOMY Right 1999   PARTIAL COLECTOMY     THYROIDECTOMY     TOTAL HIP ARTHROPLASTY Left 03/19/2018   Procedure: TOTAL HIP ARTHROPLASTY LEFT;  Surgeon: Hessie Knows, MD;  Location: [facility] ORS;  Service: Orthopedics;  Laterality: Left;   VIDEO ASSISTED THORACOSCOPY Left 09/10/2017   Procedure: VIDEO ASSISTED THORACOSCOPY;  Surgeon: [name], MD;  Location: [facility] ORS;  Service: Thoracic;  Laterality: Left;  with biopsies   VIDEO BRONCHOSCOPY  09/10/2017   Procedure: VIDEO BRONCHOSCOPY;  Surgeon: [name], MD;  Location: [facility] ORS;  Service: Thoracic;;    Family Psychiatric History: Please see initial evaluation for full details. I have reviewed the history. No updates at this time.     Family History:  Family History  Problem Relation Age of Onset   Dementia Mother    Dementia Sister    Diabetes Sister    Heart attack Brother 48   COPD Brother    Lung cancer Brother        hx smoking   Colon cancer Brother    Liver cancer  Brother    COPD Brother    Lung cancer Brother        hx smoking   Colon cancer Maternal Grandfather        dx >.50   Breast cancer Neg Hx     Social History:  Social History   Socioeconomic History   Marital status: Married    Spouse name: evert   Number of children: 4   Years of education: Not on file   Highest education level: 9th grade  Occupational History   Not on file  Tobacco Use   Smoking status: Never   Smokeless tobacco: Never  Vaping Use   Vaping Use: Never used  Substance and Sexual Activity   Alcohol use: No    Alcohol/week: 0.0 standard drinks   Drug use: No   Sexual activity: Not Currently  Other Topics Concern   Not on file  Social History Narrative   Not on file   Social Determinants of Health   Financial Resource Strain: Not on file   Food Insecurity: Not on file  Transportation Needs: Not on file  Physical Activity: Not on file  Stress: Not on file  Social Connections: Not on file    Allergies:  Allergies  Allergen Reactions   Imipramine Tinitus   Latex Rash   Penicillins Rash   Tape Rash    Metabolic Disorder Labs: No results found for: HGBA1C, MPG No results found for: PROLACTIN No results found for: CHOL, TRIG, HDL, CHOLHDL, VLDL, LDLCALC Lab Results  Component Value Date   TSH 0.275 (L) 11/09/2012   TSH 0.443 (L) 10/12/2012    Therapeutic Level Labs: No results found for: LITHIUM No results found for: VALPROATE No components found for:  CBMZ  Current Medications: Current Outpatient Medications  Medication Sig Dispense Refill   acetaminophen (TYLENOL) 325 MG tablet Take 650 mg by mouth every 4 (four) hours as needed. For pain / increased temp.  May be administered orally,  per G-Tube if needed or rectally if unable to swallow (separate order).  Maximum dose for 24 hours is 3,000 mg from all sources of Acetaminophen / Tylenol     atorvastatin (LIPITOR) 40 MG tablet Take 1 tablet (40 mg total) by mouth at bedtime. 30 tablet 0   Baclofen 5 MG TABS Take 1 tablet by mouth 2 (two) times daily.     busPIRone (BUSPAR) 10 MG tablet Take 1 tablet (10 mg total) by mouth 2 (two) times daily. 180 tablet 0   cholecalciferol (VITAMIN D) 1000 units tablet Take 1,000 Units by mouth daily.     DULoxetine (CYMBALTA) 60 MG capsule Take 1 capsule (60 mg total) by mouth every morning. 90 capsule 0   gabapentin (NEURONTIN) 300 MG capsule Take 300 mg by mouth 2 (two) times daily.     HYDROcodone-acetaminophen (NORCO/VICODIN) 5-325 MG tablet Take by mouth.     letrozole (FEMARA) 2.5 MG tablet TAKE 1 TABLET BY MOUTH EVERY DAY 90 tablet 1   levothyroxine (SYNTHROID) 100 MCG tablet Take 1 tablet by mouth daily.     MODERNA COVID-19 VACCINE 100 MCG/0.5ML injection      MYRBETRIQ 50 MG TB24 tablet TAKE 1 TABLET BY MOUTH EVERY  DAY 30 tablet 11   ondansetron (ZOFRAN) 4 MG tablet Take 1 tablet (4 mg total) by mouth every 6 (six) hours as needed for nausea or vomiting. 30 tablet 1   oxyCODONE-acetaminophen (PERCOCET) 5-325 MG tablet Take 1 tablet by mouth every 4 (four) hours as  needed for severe pain. (Patient not taking: Reported on 10/20/2020) 20 tablet 0   pantoprazole (PROTONIX) 40 MG tablet Take 1 tablet (40 mg total) by mouth 2 (two) times daily. 60 tablet 0   predniSONE (DELTASONE) 10 MG tablet PLEASE SEE ATTACHED FOR DETAILED DIRECTIONS (Patient not taking: Reported on 10/20/2020)     traZODone (DESYREL) 100 MG tablet TAKE 1 TABLET BY MOUTH EVERYDAY AT BEDTIME 90 tablet 0   vitamin B-12 (CYANOCOBALAMIN) 500 MCG tablet Take 500 mcg by mouth daily.      No current facility-administered medications for this visit.     Musculoskeletal: Strength & Muscle Tone:  N/A Gait & Station:  N/A Patient leans: N/A  Psychiatric Specialty Exam: Review of Systems  There were no vitals taken for this visit.There is no height or weight on file to calculate BMI.  General Appearance: {Appearance:22683}  Eye Contact:  {BHH EYE CONTACT:22684}  Speech:  Clear and Coherent  Volume:  Normal  Mood:  {BHH MOOD:22306}  Affect:  {Affect (PAA):22687}  Thought Process:  Coherent  Orientation:  Full (Time, Place, and Person)  Thought Content: Logical   Suicidal Thoughts:  {ST/HT (PAA):22692}  Homicidal Thoughts:  {ST/HT (PAA):22692}  Memory:  Immediate;   Good  Judgement:  {Judgement (PAA):22694}  Insight:  {Insight (PAA):22695}  Psychomotor Activity:  Normal  Concentration:  Concentration: Good and Attention Span: Good  Recall:  Good  Fund of Knowledge: Good  Language: Good  Akathisia:  No  Handed:  Right  AIMS (if indicated): not done  Assets:  Communication Skills Desire for Improvement  ADL's:  Intact  Cognition: WNL  Sleep:  {BHH GOOD/FAIR/POOR:22877}   Screenings: Flowsheet Row Video Visit from 11/17/2020 in  [facility] Admission (Discharged) from 05/20/2020 in [facility] CATEGORY No Risk No Risk         Assessment and Plan:  Breanna Zhang is a 80 y.o. year old female with a history of depression, PTSD. metastatic ER positive breast cancer on letrozole s/p mastectomy, chemotherapy, DVT, hypertension ,OSA, s/p hysterectomy , who presents for follow up appointment for below.    1. MDD (major depressive disorder), recurrent, in full remission ([place]) 2. Post traumatic stress disorder (PTSD) (being attacked from a dog) Although she reports occasional depressed mood due to demoralization due to her medical condition including pain, she enjoys interaction with her family, and denies any concerns at this time.  Will continue current dose of duloxetine to target depression and anxiety.  Will continue BuSpar for anxiety.    # Insomnia She reports good benefit from trazodone.  Will continue current dose to target insomnia.    Plan Continue duloxetine 60 mg daily Continue BuSpar 10 mg twice a day Continue trazodone 100 mg at night as needed for insomnia Next appointment 12/2 at 9 AM, video   The patient demonstrates the following risk factors for suicide: Chronic risk factors for suicide include: psychiatric disorder of depression, anxiety and chronic pain. Acute risk factors for suicide include: N/A. Protective factors for this patient include: positive social support, coping skills, and hope for the future. Considering these factors, the overall suicide risk at this point appears to be low. Patient is appropriate for outpatient follow up.     Norman Clay, MD 02/09/2021, 1:37 PM

## 2021-02-10 DIAGNOSIS — H353211 Exudative age-related macular degeneration, right eye, with active choroidal neovascularization: Secondary | ICD-10-CM | POA: Diagnosis not present

## 2021-02-11 ENCOUNTER — Other Ambulatory Visit: Payer: Self-pay

## 2021-02-11 ENCOUNTER — Telehealth: Payer: Self-pay | Admitting: Psychiatry

## 2021-02-11 ENCOUNTER — Telehealth: Payer: PPO | Admitting: Psychiatry

## 2021-02-11 DIAGNOSIS — M75101 Unspecified rotator cuff tear or rupture of right shoulder, not specified as traumatic: Secondary | ICD-10-CM | POA: Diagnosis not present

## 2021-02-11 DIAGNOSIS — M75102 Unspecified rotator cuff tear or rupture of left shoulder, not specified as traumatic: Secondary | ICD-10-CM | POA: Diagnosis not present

## 2021-02-11 DIAGNOSIS — M19011 Primary osteoarthritis, right shoulder: Secondary | ICD-10-CM | POA: Diagnosis not present

## 2021-02-11 DIAGNOSIS — M542 Cervicalgia: Secondary | ICD-10-CM | POA: Diagnosis not present

## 2021-02-11 DIAGNOSIS — M5030 Other cervical disc degeneration, unspecified cervical region: Secondary | ICD-10-CM | POA: Diagnosis not present

## 2021-02-11 DIAGNOSIS — M19012 Primary osteoarthritis, left shoulder: Secondary | ICD-10-CM | POA: Diagnosis not present

## 2021-02-11 DIAGNOSIS — M47812 Spondylosis without myelopathy or radiculopathy, cervical region: Secondary | ICD-10-CM | POA: Diagnosis not present

## 2021-02-11 DIAGNOSIS — M25512 Pain in left shoulder: Secondary | ICD-10-CM | POA: Diagnosis not present

## 2021-02-11 DIAGNOSIS — M25511 Pain in right shoulder: Secondary | ICD-10-CM | POA: Diagnosis not present

## 2021-02-11 NOTE — Telephone Encounter (Signed)
She answered the phone for the appointment today. She states that she cannot understand this Probation officer, and states that she needs to talk with someone who speaks "better [place]." She asks the care to be transferred, and hang up the phone.   Will ask the other provider in the clinic regarding this transfer.

## 2021-02-14 ENCOUNTER — Telehealth: Payer: Self-pay | Admitting: Psychiatry

## 2021-02-14 NOTE — Telephone Encounter (Signed)
Will call back at a later date to set up an appt.

## 2021-02-25 ENCOUNTER — Other Ambulatory Visit: Payer: Self-pay | Admitting: Orthopedic Surgery

## 2021-02-25 DIAGNOSIS — M5030 Other cervical disc degeneration, unspecified cervical region: Secondary | ICD-10-CM | POA: Diagnosis not present

## 2021-02-25 DIAGNOSIS — M19012 Primary osteoarthritis, left shoulder: Secondary | ICD-10-CM

## 2021-02-25 DIAGNOSIS — M75101 Unspecified rotator cuff tear or rupture of right shoulder, not specified as traumatic: Secondary | ICD-10-CM

## 2021-02-25 DIAGNOSIS — M47812 Spondylosis without myelopathy or radiculopathy, cervical region: Secondary | ICD-10-CM | POA: Diagnosis not present

## 2021-02-25 DIAGNOSIS — M75102 Unspecified rotator cuff tear or rupture of left shoulder, not specified as traumatic: Secondary | ICD-10-CM

## 2021-02-25 DIAGNOSIS — M4802 Spinal stenosis, cervical region: Secondary | ICD-10-CM | POA: Diagnosis not present

## 2021-02-25 DIAGNOSIS — M19011 Primary osteoarthritis, right shoulder: Secondary | ICD-10-CM | POA: Diagnosis not present

## 2021-02-28 ENCOUNTER — Other Ambulatory Visit: Payer: Self-pay | Admitting: Orthopedic Surgery

## 2021-02-28 DIAGNOSIS — M4802 Spinal stenosis, cervical region: Secondary | ICD-10-CM

## 2021-03-01 NOTE — Progress Notes (Signed)
Spoke with pt. Re: myelogram on 12/22. Instructed pt[facility] at 08:30 for 09:30 AM procedure, to be NPO after MN tomorrow, to have husband as her driver for procedure, and be prepared to stay up to 4 hours post procedure. Pt. Informed she is able to take her AM meds on 12/22. Pt. Verbalized understanding of instructions.

## 2021-03-03 ENCOUNTER — Ambulatory Visit
Admission: RE | Admit: 2021-03-03 | Discharge: 2021-03-03 | Disposition: A | Discharge status: Home / Self Care | Payer: PPO | Source: Ambulatory Visit | Attending: Orthopedic Surgery | Admitting: Orthopedic Surgery

## 2021-03-03 ENCOUNTER — Ambulatory Visit: Payer: PPO

## 2021-03-15 ENCOUNTER — Ambulatory Visit: Payer: PPO | Admitting: Psychiatry

## 2021-03-18 ENCOUNTER — Ambulatory Visit: Payer: PPO

## 2021-03-18 ENCOUNTER — Ambulatory Visit: Admission: RE | Admit: 2021-03-18 | Payer: PPO | Source: Ambulatory Visit

## 2021-03-18 DIAGNOSIS — I4819 Other persistent atrial fibrillation: Secondary | ICD-10-CM | POA: Diagnosis not present

## 2021-03-18 DIAGNOSIS — H353290 Exudative age-related macular degeneration, unspecified eye, stage unspecified: Secondary | ICD-10-CM | POA: Diagnosis not present

## 2021-03-18 DIAGNOSIS — M479 Spondylosis, unspecified: Secondary | ICD-10-CM | POA: Diagnosis not present

## 2021-03-18 DIAGNOSIS — J84112 Idiopathic pulmonary fibrosis: Secondary | ICD-10-CM | POA: Diagnosis not present

## 2021-03-18 DIAGNOSIS — M170 Bilateral primary osteoarthritis of knee: Secondary | ICD-10-CM | POA: Diagnosis not present

## 2021-03-18 DIAGNOSIS — I739 Peripheral vascular disease, unspecified: Secondary | ICD-10-CM | POA: Diagnosis not present

## 2021-03-21 ENCOUNTER — Other Ambulatory Visit: Payer: Self-pay | Admitting: Oncology

## 2021-03-25 DIAGNOSIS — M5416 Radiculopathy, lumbar region: Secondary | ICD-10-CM | POA: Diagnosis not present

## 2021-03-25 DIAGNOSIS — M48062 Spinal stenosis, lumbar region with neurogenic claudication: Secondary | ICD-10-CM | POA: Diagnosis not present

## 2021-03-25 DIAGNOSIS — M5136 Other intervertebral disc degeneration, lumbar region: Secondary | ICD-10-CM | POA: Diagnosis not present

## 2021-03-29 ENCOUNTER — Ambulatory Visit
Admission: RE | Admit: 2021-03-29 | Discharge: 2021-03-29 | Disposition: A | Discharge status: Home / Self Care | Payer: PPO | Source: Ambulatory Visit | Attending: Orthopedic Surgery | Admitting: Orthopedic Surgery

## 2021-03-29 DIAGNOSIS — S46012A Strain of muscle(s) and tendon(s) of the rotator cuff of left shoulder, initial encounter: Secondary | ICD-10-CM | POA: Diagnosis not present

## 2021-03-29 DIAGNOSIS — M75101 Unspecified rotator cuff tear or rupture of right shoulder, not specified as traumatic: Secondary | ICD-10-CM | POA: Insufficient documentation

## 2021-03-29 DIAGNOSIS — M75102 Unspecified rotator cuff tear or rupture of left shoulder, not specified as traumatic: Secondary | ICD-10-CM | POA: Insufficient documentation

## 2021-03-29 DIAGNOSIS — M19012 Primary osteoarthritis, left shoulder: Secondary | ICD-10-CM | POA: Diagnosis not present

## 2021-03-29 DIAGNOSIS — M25612 Stiffness of left shoulder, not elsewhere classified: Secondary | ICD-10-CM | POA: Diagnosis not present

## 2021-03-29 MED ORDER — IOHEXOL 180 MG/ML  SOLN
15.0000 mL | Freq: Once | INTRAMUSCULAR | Status: AC | PRN
  Administered 2021-03-29: 15 mL

## 2021-03-29 MED ORDER — LIDOCAINE HCL (PF) 1 % IJ SOLN
5.0000 mL | Freq: Once | INTRAMUSCULAR | Status: AC
  Administered 2021-03-29: 5 mL
  Filled 2021-03-29: qty 5

## 2021-04-01 ENCOUNTER — Other Ambulatory Visit: Payer: Self-pay

## 2021-04-01 ENCOUNTER — Inpatient Hospital Stay (HOSPITAL_BASED_OUTPATIENT_CLINIC_OR_DEPARTMENT_OTHER): Payer: PPO | Admitting: Oncology

## 2021-04-01 ENCOUNTER — Inpatient Hospital Stay: Payer: PPO | Attending: Oncology

## 2021-04-01 ENCOUNTER — Other Ambulatory Visit: Payer: Self-pay | Admitting: Oncology

## 2021-04-01 ENCOUNTER — Other Ambulatory Visit: Payer: Self-pay | Admitting: *Deleted

## 2021-04-01 ENCOUNTER — Encounter: Payer: Self-pay | Admitting: Oncology

## 2021-04-01 VITALS — BP 115/69 | HR 82 | Temp 98.5°F | Resp 18 | Wt 172.5 lb

## 2021-04-01 DIAGNOSIS — Z170 Estrogen receptor positive status [ER+]: Secondary | ICD-10-CM | POA: Insufficient documentation

## 2021-04-01 DIAGNOSIS — C782 Secondary malignant neoplasm of pleura: Secondary | ICD-10-CM | POA: Diagnosis not present

## 2021-04-01 DIAGNOSIS — Z79811 Long term (current) use of aromatase inhibitors: Secondary | ICD-10-CM

## 2021-04-01 DIAGNOSIS — C50919 Malignant neoplasm of unspecified site of unspecified female breast: Secondary | ICD-10-CM | POA: Insufficient documentation

## 2021-04-01 DIAGNOSIS — M8580 Other specified disorders of bone density and structure, unspecified site: Secondary | ICD-10-CM | POA: Insufficient documentation

## 2021-04-01 LAB — CBC WITH DIFFERENTIAL/PLATELET
Abs Immature Granulocytes: 0.02 10*3/uL (ref 0.00–0.07)
Basophils Absolute: 0.1 10*3/uL (ref 0.0–0.1)
Basophils Relative: 1 %
Eosinophils Absolute: 0.1 10*3/uL (ref 0.0–0.5)
Eosinophils Relative: 1 %
HCT: 37.7 % (ref 36.0–46.0)
Hemoglobin: 12.6 g/dL (ref 12.0–15.0)
Immature Granulocytes: 0 %
Lymphocytes Relative: 29 %
Lymphs Abs: 2.1 10*3/uL (ref 0.7–4.0)
MCH: 30 pg (ref 26.0–34.0)
MCHC: 33.4 g/dL (ref 30.0–36.0)
MCV: 89.8 fL (ref 80.0–100.0)
Monocytes Absolute: 0.9 10*3/uL (ref 0.1–1.0)
Monocytes Relative: 12 %
Neutro Abs: 4.1 10*3/uL (ref 1.7–7.7)
Neutrophils Relative %: 57 %
Platelets: 209 10*3/uL (ref 150–400)
RBC: 4.2 MIL/uL (ref 3.87–5.11)
RDW: 13.2 % (ref 11.5–15.5)
WBC: 7.2 10*3/uL (ref 4.0–10.5)
nRBC: 0 % (ref 0.0–0.2)

## 2021-04-01 LAB — COMPREHENSIVE METABOLIC PANEL
ALT: 23 U/L (ref 0–44)
AST: 28 U/L (ref 15–41)
Albumin: 3.7 g/dL (ref 3.5–5.0)
Alkaline Phosphatase: 68 U/L (ref 38–126)
Anion gap: 5 (ref 5–15)
BUN: 11 mg/dL (ref 8–23)
CO2: 27 mmol/L (ref 22–32)
Calcium: 8.2 mg/dL — ABNORMAL LOW (ref 8.9–10.3)
Chloride: 105 mmol/L (ref 98–111)
Creatinine, Ser: 0.72 mg/dL (ref 0.44–1.00)
GFR, Estimated: >60 mL/min (ref >60)
Glucose, Bld: 92 mg/dL (ref 70–99)
Potassium: 3.5 mmol/L (ref 3.5–5.1)
Sodium: 137 mmol/L (ref 135–145)
Total Bilirubin: 0.5 mg/dL (ref 0.3–1.2)
Total Protein: 6.3 g/dL — ABNORMAL LOW (ref 6.5–8.1)

## 2021-04-01 MED ORDER — ONDANSETRON HCL 4 MG PO TABS: 4.0000 mg | ORAL_TABLET | Freq: Four times a day (QID) | ORAL | 1 refills | Status: DC | PRN

## 2021-04-01 NOTE — Progress Notes (Signed)
6 month breast cancer follow up today. Taking femara as scheduled. Has hot flashes from the femara , not a lot of the time. Appetite is good. Back pain with joint pains. Pt would like a refill on her nausea meds as she does has nausea from time to time. The smell of certain meats can make her nauseated.

## 2021-04-01 NOTE — Progress Notes (Signed)
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  She underwent mastectomy followed by adjuvant chemotherapy and 5 years of tamoxifen.  Patient has been having some low back pain and underwent a CT lumbar spine without contrast on 08/10/2017 which was done by the pain clinic.  CT mainly showed age-related degenerative disease but also showed incidental nodular thickening of the posterior left diaphragm concerning for tumor and a CT chest was recommended.   CT chest on 08/14/2017 showed scattered left pleural nodularity with tiny left pleural effusion and prominent left internal mammary and juxtadiaphragmatic lymph nodes which are nonspecific but metastatic disease could not be ruled out.   This was followed by a PET/CT scan on 08/22/2017 which showed numerous small left-sided pleural nodules which were mildly hypermetabolic along with hypermetabolic mediastinal lymph nodes concerning for metastatic disease.  Small metastatic nodule in the left upper quadrant partly in the retrocrural space.  No findings of pulmonary metastatic or osseous metastatic disease.    Patient underwent FNA of retrocrural LN which was positive for carcinoma but primary could not be ascertained. She then underwent pleural biopsy which  showed metastatic carcinoma consistent with breast primary. ER+ HER-2.  Patient started taking Ibrance in August 2019.  Ibrance on hold since January 2021 with stable disease on letrozole   NGS testing showed PIKCA and BRCA mutation    Interval history- Patient currently reports doing well for her age overall.  She has ongoing joint pains but is able to ambulate with a cane.  No recent falls.She also underwent a CT of the left shoulder for ongoing left shoulder pain and was found to have full-thickness tear of the infraspinatus tendon footprint as well as partial thickness tear of supraspinatus.  She will be seeing Ortho for this.  ECOG PS- 2 Pain scale- 3   Review of systems- Review of Systems  Constitutional:  Positive for malaise/fatigue. Negative for chills, fever and weight loss.  HENT:  Negative for congestion, ear discharge and nosebleeds.   Eyes:  Negative for blurred vision.  Respiratory:  Negative for cough, hemoptysis, sputum production, shortness of breath and wheezing.   Cardiovascular:  Negative for chest pain, palpitations, orthopnea and claudication.  Gastrointestinal:  Negative for abdominal pain, blood in stool, constipation, diarrhea, heartburn, melena, nausea and vomiting.  Genitourinary:  Negative for dysuria, flank pain, frequency, hematuria and urgency.  Musculoskeletal:  Positive for joint pain. Negative for back pain and myalgias.  Skin:  Negative for rash.  Neurological:  Negative for dizziness, tingling, focal weakness, seizures, weakness and headaches.  Endo/Heme/Allergies:  Does not bruise/bleed easily.  Psychiatric/Behavioral:  Negative for depression and suicidal ideas. The patient does not have insomnia.      Allergies  Allergen Reactions   Imipramine Tinitus   Latex Rash   Penicillins Rash   Tape Rash     Past  Medical History:  Diagnosis Date   Anxiety    Atrial fibrillation (HCC)    Breast cancer ([place])    Breast cancer, right ([place]) 1999   RT  MASTECTOMY and chemo tx's.    DDD (degenerative disc disease), cervical    hands and back with arthritis   Depression    Diverticulosis    Diverticulosis    DVT of lower extremity (deep venous thrombosis) (HCC)    Dyspnea    Family history of colon cancer    Family history of lung cancer    Family history of throat cancer    GERD (gastroesophageal reflux disease)    Goiter    Headache    History of chemotherapy 2000   BREAST CA   Hypertension    Incontinence    Neuromuscular disorder ([place])    peipheral neuropathy   OSA (obstructive sleep apnea)    Osteoporosis    Personal history of chemotherapy 1999   BREAST CA   Status post chemotherapy    2000 right breast cancer   Thyroid disease    UTI (urinary tract infection)    Vertigo      Past Surgical History:  Procedure Laterality Date   ABDOMINAL HYSTERECTOMY     BREAST BIOPSY Left 2006   negative   BUNIONECTOMY Bilateral    Screws implanted.   CHOLECYSTECTOMY     COCHLEAR IMPLANT Right    COLONOSCOPY WITH PROPOFOL N/A 07/24/2016   Procedure: COLONOSCOPY WITH PROPOFOL;  Surgeon: Manya Silvas, MD;  Location: [facility] ENDOSCOPY;  Service: Endoscopy;  Laterality: N/A;   ESOPHAGOGASTRODUODENOSCOPY (EGD) WITH PROPOFOL N/A 04/12/2016   Procedure: ESOPHAGOGASTRODUODENOSCOPY (EGD) WITH PROPOFOL;  Surgeon: Manya Silvas, MD;  Location: [facility] ENDOSCOPY;  Service: Endoscopy;  Laterality: N/A;   EYE SURGERY Bilateral    cataract   FOOT SURGERY Bilateral    HAND SURGERY Right    KYPHOPLASTY N/A 05/20/2020   Procedure: L3 KYPHOPLASTY;  Surgeon: Hessie Knows, MD;  Location: [facility] ORS;  Service: Orthopedics;  Laterality: N/A;   MASTECTOMY Right 1999   BREAST CA   MASTECTOMY Right 1999   PARTIAL COLECTOMY     THYROIDECTOMY     TOTAL HIP ARTHROPLASTY Left 03/19/2018   Procedure: TOTAL HIP ARTHROPLASTY LEFT;  Surgeon: Hessie Knows, MD;  Location: [facility] ORS;  Service: Orthopedics;  Laterality: Left;   VIDEO ASSISTED THORACOSCOPY Left  09/10/2017   Procedure: VIDEO ASSISTED THORACOSCOPY;  Surgeon: [name], MD;  Location: [facility] ORS;  Service: Thoracic;  Laterality: Left;  with biopsies   VIDEO BRONCHOSCOPY  09/10/2017   Procedure: VIDEO BRONCHOSCOPY;  Surgeon: [name], MD;  Location: [facility] ORS;  Service: Thoracic;;    Social History   Socioeconomic History   Marital status: Married    Spouse name: evert   Number of children: 4   Years of education: Not on file   Highest education level: 9th grade  Occupational History   Not on file  Tobacco Use   Smoking status: Never   Smokeless tobacco: Never  Vaping Use   Vaping Use: Never used  Substance and Sexual Activity   Alcohol use: No    Alcohol/week: 0.0 standard drinks   Drug use: No   Sexual activity: Not Currently  Other Topics Concern   Not on file  Social History Narrative   Not on file   Social Determinants of Health   Financial Resource Strain: Not on file  Food Insecurity: Not on file  Transportation Needs: Not on file  Physical Activity: Not on file  Stress: Not on file  Social Connections: Not on file  Intimate Partner Violence: Not on file    Family History  Problem Relation Age of Onset   Dementia Mother    Dementia Sister    Diabetes Sister    Heart attack Brother 7   COPD Brother    Lung cancer Brother        hx smoking   Colon cancer Brother    Liver cancer Brother    COPD Brother    Lung cancer Brother        hx smoking   Colon cancer Maternal Grandfather        dx >.50   Breast cancer Neg Hx      Current Outpatient Medications:    acetaminophen (TYLENOL) 325 MG tablet, Take 650 mg by mouth every 4 (four) hours as needed. For pain / increased temp.  May be administered orally,  per G-Tube if needed or rectally if unable to swallow (separate order).  Maximum dose for 24 hours is 3,000 mg from all sources of Acetaminophen / Tylenol, Disp: , Rfl:    atorvastatin (LIPITOR) 40 MG tablet, Take 1 tablet (40 mg total) by  mouth at bedtime., Disp: 30 tablet, Rfl: 0   Baclofen 5 MG TABS, Take 1 tablet by mouth 2 (two) times daily., Disp: , Rfl:    cholecalciferol (VITAMIN D) 1000 units tablet, Take 1,000 Units by mouth daily., Disp: , Rfl:    DULoxetine (CYMBALTA) 60 MG capsule, Take 1 capsule (60 mg total) by mouth every morning., Disp: 90 capsule, Rfl: 0   gabapentin (NEURONTIN) 300 MG capsule, Take 300 mg by mouth 2 (two) times daily., Disp: , Rfl:    HYDROcodone-acetaminophen (NORCO/VICODIN) 5-325 MG tablet, Take by mouth., Disp: , Rfl:    letrozole (FEMARA) 2.5 MG tablet, TAKE 1 TABLET BY MOUTH EVERY DAY, Disp: 90 tablet, Rfl: 1   levothyroxine (SYNTHROID) 100 MCG tablet, Take 1 tablet by mouth daily., Disp: , Rfl:    MYRBETRIQ 50 MG TB24 tablet, TAKE 1 TABLET BY MOUTH EVERY DAY, Disp: 30 tablet, Rfl: 11   pantoprazole (PROTONIX) 40 MG tablet, Take 1 tablet (40 mg total) by mouth 2 (two) times daily., Disp: 60 tablet, Rfl: 0   traZODone (DESYREL) 100 MG tablet, TAKE 1 TABLET BY MOUTH EVERYDAY AT BEDTIME, Disp: 90 tablet, Rfl: 0   vitamin B-12 (CYANOCOBALAMIN) 500 MCG tablet, Take 500 mcg by mouth daily. , Disp: , Rfl:    MODERNA COVID-19 VACCINE 100 MCG/0.5ML injection, , Disp: , Rfl:    ondansetron (ZOFRAN) 4 MG tablet, Take 1 tablet (4 mg total) by mouth every 6 (six) hours as needed for nausea or vomiting., Disp: 30 tablet, Rfl: 1   oxyCODONE-acetaminophen (PERCOCET) 5-325 MG tablet, Take 1 tablet by mouth every 4 (four) hours as needed for severe pain. (Patient not taking: Reported on 10/20/2020), Disp: 20 tablet, Rfl: 0   predniSONE (DELTASONE) 10 MG tablet, PLEASE SEE ATTACHED FOR DETAILED DIRECTIONS (Patient not taking: Reported on 10/20/2020), Disp: , Rfl:   Physical exam:  Vitals:   04/01/21 1100  BP: 115/69  Pulse: 82  Resp: 18  Temp: 98.5 F (36.9 C)  TempSrc: Oral  SpO2: 100%  Weight: 172 lb 8 oz (78.2 kg)   Physical Exam Constitutional:      General: She is not in acute distress.     Comments: Ambulates with a cane  Cardiovascular:     Rate and  Rhythm: Normal rate and regular rhythm.     Heart sounds: Normal heart sounds.  Pulmonary:     Effort: Pulmonary effort is normal.     Breath sounds: Normal breath sounds.  Abdominal:     General: Bowel sounds are normal.     Palpations: Abdomen is soft.  Skin:    General: Skin is warm and dry.  Neurological:     Mental Status: She is alert and oriented to person, place, and time.     CMP Latest Ref Rng & Units 04/01/2021  Glucose 70 - 99 mg/dL 92  BUN 8 - 23 mg/dL 11  Creatinine 0.44 - 1.00 mg/dL 0.72  Sodium 135 - 145 mmol/L 137  Potassium 3.5 - 5.1 mmol/L 3.5  Chloride 98 - 111 mmol/L 105  CO2 22 - 32 mmol/L 27  Calcium 8.9 - 10.3 mg/dL 8.2(L)  Total Protein 6.5 - 8.1 g/dL 6.3(L)  Total Bilirubin 0.3 - 1.2 mg/dL 0.5  Alkaline Phos 38 - 126 U/L 68  AST 15 - 41 U/L 28  ALT 0 - 44 U/L 23   CBC Latest Ref Rng & Units 04/01/2021  WBC 4.0 - 10.5 K/uL 7.2  Hemoglobin 12.0 - 15.0 g/dL 12.6  Hematocrit 36.0 - 46.0 % 37.7  Platelets 150 - 400 K/uL 209    No images are attached to the encounter.  CT SHOULDER LEFT W CONTRAST  Result Date: 03/29/2021 CLINICAL DATA:  Left shoulder pain.  Limited range of motion. EXAM: CT OF THE UPPER LEFT EXTREMITY WITH CONTRAST TECHNIQUE: Multidetector CT imaging of the upper left extremity was performed according to the standard protocol following intra-articular contrast administration. RADIATION DOSE REDUCTION: This exam was performed according to the departmental dose-optimization program which includes automated exposure control, adjustment of the mA and/or kV according to patient size and/or use of iterative reconstruction technique. CONTRAST:  The intra-articular contrast injection was of a dilute contrast solution containing 5 mL sterile saline and 15 mL Omnipaque 180. This is described in a separate procedure note. COMPARISON:  None. FINDINGS: Bones/Joint/Cartilage There is  mild-to-moderate glenohumeral joint space narrowing. Mild-to-moderate joint space narrowing and peripheral osteophytosis degenerative changes of the acromioclavicular joint. Ligaments Suboptimally assessed by CT. Muscles and Tendons There is adequate distention of the glenohumeral joint following arthrogram injection. There is abnormal extension of gadolinium contrast into the subacromial/subdeltoid bursa through a full-thickness tear within the mid to posterior infraspinatus tendon footprint (sagittal image 22, coronal image 36). There is also linear extension duodenum contrast throughout the midsubstance of the supraspinatus tendon footprint (coronal images 28 through 31, a thin longitudinal tear that does not extend through the bursal tendon surface but does connect with fluid within the glenohumeral joint through the articular tendon surface. No tendon retraction. Soft tissues Possible minimal atrophy of the supraspinatus muscle body. A small portion of the lateral left lung is imaged. There is mild left apical pleuroparenchymal scarring. IMPRESSION: 1. Thin full-thickness tear within the mid to posterior infraspinatus tendon footprint. 2. Partial-thickness articular sided linear tear within the supraspinatus tendon footprint. 3. Possible minimal atrophy of the supraspinatus muscle body. Electronically Signed   By: Yvonne Kendall M.D.   On: 03/29/2021 14:13   DG FLUORO GUIDED NEEDLE PLC ASPIRATION/INJECTION LOC  Result Date: 03/29/2021 CLINICAL DATA:  Patient complains of bilateral shoulder pain since December with limited overhead range of motion, no known specific injury. Request received for left shoulder CT arthrogram. Patient has hearing implants in which she can not have MRI. EXAM:  EXAM LEFT SHOULDER CONTRAST INJECTION UNDER FLUOROSCOPY FOR CT FLUOROSCOPY TIME:  0.1 minute, 0.20 mGy TECHNIQUE: The risks and benefits of the procedure were discussed with the patient, including but not limited to use of  fluoroscopy, bleeding, infection, adverse reaction to contrast and written informed consent was obtained. The patient stated no history of allergy to contrast media. A formal timeout procedure was performed with the patient according to departmental protocol. The patient was placed supine on the fluoroscopy table and the left shoulder joint was identified under fluoroscopy. The skin overlying the joint was subsequently cleaned with Chloraprep and a sterile drape was placed over the area of interest. 5 ml 1% Lidocaine was used to anesthetize the skin around the needle insertion site. A 22 gauge needle was inserted into the joint under fluoroscopy. Intra-articular location was confirmed with loss of resistance technique with injection of 53m of 1% lidocaine. 12 ml of sterile mixture including 5 mL sterile saline and 15 mL Omnipaque 180) was injected into the joint. The needle was removed and hemostasis was achieved. A sterile bandage was applied. The patient was subsequently transferred to CT for imaging. COMPLICATIONS: COMPLICATIONS No immediate complications. IMPRESSION: 1. Technically successful fluoroscopic guided left shoulder contrast injection for CT. This exam was performed by KTsosie BillingPA-C, and was supervised and interpreted by Dr. MTery Sanfilippo Electronically Signed   By: EMisty StanleyM.D.   On: 03/29/2021 11:16     Assessment and plan- Patient is a 81y.o. female with metastatic ER positive breast cancer with pleural nodules here for routine follow-up for   Her last CT scan in July 2022 did not show any evidence of recurrent or progressive disease.  At diagnosis she was found to have pleural-based nodules which are no longer visible.  I will plan to repeat CT scans again in July 2023.  Presently she is on single agent letrozole which she is tolerating it well along with calcium and vitamin D.  She does have some ongoing joint pains which could be either age-related osteoarthritis versus secondary to  letrozole.  Continue to monitor.  She did have a bone density scan in March 2022 which showed osteopenia with a 10-year probability of hip fracture at 2.9% and major osteoporotic fracture is 17%.  I will hold off on starting any bisphosphonates at this time and I will see her back in 6 months time   Visit Diagnosis 1. [place]atic breast [place] ([place]   2. Malignant neoplasm of left female breast, unspecified estrogen receptor status, unspecified site of breast ([place]      Dr. ARanda Evens MD, MPH [id number]/20/2023 12:32 PM

## 2021-04-02 LAB — CANCER ANTIGEN 15-3: CA 15-3: 26 U/mL — ABNORMAL HIGH (ref 0.0–25.0)

## 2021-04-04 ENCOUNTER — Telehealth: Payer: Self-pay | Admitting: *Deleted

## 2021-04-04 NOTE — Telephone Encounter (Signed)
Submitted PA for Breanna Zhang (Key: [id number]) Ondansetron HCl 4MG  tablets  Pending insurance approval

## 2021-04-06 NOTE — Telephone Encounter (Signed)
Per incoming fax on 1/23- approved by pt's insurance

## 2021-04-07 DIAGNOSIS — G4733 Obstructive sleep apnea (adult) (pediatric): Secondary | ICD-10-CM | POA: Diagnosis not present

## 2021-04-11 ENCOUNTER — Ambulatory Visit (INDEPENDENT_AMBULATORY_CARE_PROVIDER_SITE_OTHER): Payer: PPO | Admitting: Psychiatry

## 2021-04-11 ENCOUNTER — Other Ambulatory Visit: Payer: Self-pay

## 2021-04-11 ENCOUNTER — Encounter: Payer: Self-pay | Admitting: Psychiatry

## 2021-04-11 VITALS — BP 161/78 | HR 93 | Temp 98.3°F | Wt 173.6 lb

## 2021-04-11 DIAGNOSIS — G4700 Insomnia, unspecified: Secondary | ICD-10-CM

## 2021-04-11 DIAGNOSIS — F4310 Post-traumatic stress disorder, unspecified: Secondary | ICD-10-CM

## 2021-04-11 DIAGNOSIS — F3342 Major depressive disorder, recurrent, in full remission: Secondary | ICD-10-CM | POA: Diagnosis not present

## 2021-04-11 MED ORDER — DULOXETINE HCL 60 MG PO CPEP: 60.0000 mg | ORAL_CAPSULE | Freq: Every morning | ORAL | 1 refills | Status: DC

## 2021-04-11 MED ORDER — TRAZODONE HCL 100 MG PO TABS: ORAL_TABLET | ORAL | 1 refills | Status: DC

## 2021-04-11 MED ORDER — BUSPIRONE HCL 10 MG PO TABS: 10.0000 mg | ORAL_TABLET | Freq: Two times a day (BID) | ORAL | 1 refills | Status: DC

## 2021-04-11 NOTE — Telephone Encounter (Signed)
Left vm with pharmacist that script was approved on 04/04/21
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
## 2021-04-11 NOTE — Patient Instructions (Signed)
°  Please follow up with your Primary doctor to repeat your Thyroid level  , sodium and Liver function.

## 2021-04-14 DIAGNOSIS — I10 Essential (primary) hypertension: Secondary | ICD-10-CM | POA: Diagnosis not present

## 2021-04-14 DIAGNOSIS — E034 Atrophy of thyroid (acquired): Secondary | ICD-10-CM | POA: Diagnosis not present

## 2021-04-28 DIAGNOSIS — H353211 Exudative age-related macular degeneration, right eye, with active choroidal neovascularization: Secondary | ICD-10-CM | POA: Diagnosis not present

## 2021-04-28 DIAGNOSIS — H353221 Exudative age-related macular degeneration, left eye, with active choroidal neovascularization: Secondary | ICD-10-CM | POA: Diagnosis not present

## 2021-04-29 DIAGNOSIS — Z9011 Acquired absence of right breast and nipple: Secondary | ICD-10-CM | POA: Diagnosis not present

## 2021-04-29 DIAGNOSIS — Z4431 Encounter for fitting and adjustment of external right breast prosthesis: Secondary | ICD-10-CM | POA: Diagnosis not present

## 2021-04-29 DIAGNOSIS — C50111 Malignant neoplasm of central portion of right female breast: Secondary | ICD-10-CM | POA: Diagnosis not present

## 2021-05-18 DIAGNOSIS — I10 Essential (primary) hypertension: Secondary | ICD-10-CM | POA: Diagnosis not present

## 2021-05-18 DIAGNOSIS — E785 Hyperlipidemia, unspecified: Secondary | ICD-10-CM | POA: Diagnosis not present

## 2021-05-18 DIAGNOSIS — C50919 Malignant neoplasm of unspecified site of unspecified female breast: Secondary | ICD-10-CM | POA: Diagnosis not present

## 2021-05-18 DIAGNOSIS — G4733 Obstructive sleep apnea (adult) (pediatric): Secondary | ICD-10-CM | POA: Diagnosis not present

## 2021-05-18 DIAGNOSIS — C7802 Secondary malignant neoplasm of left lung: Secondary | ICD-10-CM | POA: Diagnosis not present

## 2021-05-18 DIAGNOSIS — R739 Hyperglycemia, unspecified: Secondary | ICD-10-CM | POA: Diagnosis not present

## 2021-05-18 DIAGNOSIS — E034 Atrophy of thyroid (acquired): Secondary | ICD-10-CM | POA: Diagnosis not present

## 2021-05-25 DIAGNOSIS — E785 Hyperlipidemia, unspecified: Secondary | ICD-10-CM | POA: Diagnosis not present

## 2021-05-25 DIAGNOSIS — I10 Essential (primary) hypertension: Secondary | ICD-10-CM | POA: Diagnosis not present

## 2021-05-25 DIAGNOSIS — E034 Atrophy of thyroid (acquired): Secondary | ICD-10-CM | POA: Diagnosis not present

## 2021-05-25 DIAGNOSIS — C7802 Secondary malignant neoplasm of left lung: Secondary | ICD-10-CM | POA: Diagnosis not present

## 2021-05-25 DIAGNOSIS — I700 Atherosclerosis of aorta: Secondary | ICD-10-CM | POA: Diagnosis not present

## 2021-05-25 DIAGNOSIS — M5136 Other intervertebral disc degeneration, lumbar region: Secondary | ICD-10-CM | POA: Diagnosis not present

## 2021-05-25 DIAGNOSIS — R739 Hyperglycemia, unspecified: Secondary | ICD-10-CM | POA: Diagnosis not present

## 2021-05-25 DIAGNOSIS — F3342 Major depressive disorder, recurrent, in full remission: Secondary | ICD-10-CM | POA: Diagnosis not present

## 2021-05-25 DIAGNOSIS — G4733 Obstructive sleep apnea (adult) (pediatric): Secondary | ICD-10-CM | POA: Diagnosis not present

## 2021-05-25 DIAGNOSIS — C50919 Malignant neoplasm of unspecified site of unspecified female breast: Secondary | ICD-10-CM | POA: Diagnosis not present

## 2021-05-25 DIAGNOSIS — K219 Gastro-esophageal reflux disease without esophagitis: Secondary | ICD-10-CM | POA: Diagnosis not present

## 2021-06-17 DIAGNOSIS — M170 Bilateral primary osteoarthritis of knee: Secondary | ICD-10-CM | POA: Diagnosis not present

## 2021-06-17 DIAGNOSIS — I4819 Other persistent atrial fibrillation: Secondary | ICD-10-CM | POA: Diagnosis not present

## 2021-06-17 DIAGNOSIS — E785 Hyperlipidemia, unspecified: Secondary | ICD-10-CM | POA: Diagnosis not present

## 2021-06-17 DIAGNOSIS — M479 Spondylosis, unspecified: Secondary | ICD-10-CM | POA: Diagnosis not present

## 2021-06-24 DIAGNOSIS — M5136 Other intervertebral disc degeneration, lumbar region: Secondary | ICD-10-CM | POA: Diagnosis not present

## 2021-06-24 DIAGNOSIS — M48062 Spinal stenosis, lumbar region with neurogenic claudication: Secondary | ICD-10-CM | POA: Diagnosis not present

## 2021-06-24 DIAGNOSIS — M5441 Lumbago with sciatica, right side: Secondary | ICD-10-CM | POA: Diagnosis not present

## 2021-06-24 DIAGNOSIS — M5416 Radiculopathy, lumbar region: Secondary | ICD-10-CM | POA: Diagnosis not present

## 2021-06-24 DIAGNOSIS — Z8781 Personal history of (healed) traumatic fracture: Secondary | ICD-10-CM | POA: Diagnosis not present

## 2021-06-24 DIAGNOSIS — Z79899 Other long term (current) drug therapy: Secondary | ICD-10-CM | POA: Diagnosis not present

## 2021-06-24 DIAGNOSIS — M5442 Lumbago with sciatica, left side: Secondary | ICD-10-CM | POA: Diagnosis not present

## 2021-07-28 DIAGNOSIS — H353211 Exudative age-related macular degeneration, right eye, with active choroidal neovascularization: Secondary | ICD-10-CM | POA: Diagnosis not present

## 2021-07-28 DIAGNOSIS — H353231 Exudative age-related macular degeneration, bilateral, with active choroidal neovascularization: Secondary | ICD-10-CM | POA: Diagnosis not present

## 2021-07-28 DIAGNOSIS — H353221 Exudative age-related macular degeneration, left eye, with active choroidal neovascularization: Secondary | ICD-10-CM | POA: Diagnosis not present

## 2021-08-16 ENCOUNTER — Ambulatory Visit: Payer: PPO | Admitting: Psychiatry

## 2021-08-17 DIAGNOSIS — C7802 Secondary malignant neoplasm of left lung: Secondary | ICD-10-CM | POA: Diagnosis not present

## 2021-08-17 DIAGNOSIS — F3342 Major depressive disorder, recurrent, in full remission: Secondary | ICD-10-CM | POA: Diagnosis not present

## 2021-08-17 DIAGNOSIS — I700 Atherosclerosis of aorta: Secondary | ICD-10-CM | POA: Diagnosis not present

## 2021-08-17 DIAGNOSIS — M5136 Other intervertebral disc degeneration, lumbar region: Secondary | ICD-10-CM | POA: Diagnosis not present

## 2021-08-17 DIAGNOSIS — C50919 Malignant neoplasm of unspecified site of unspecified female breast: Secondary | ICD-10-CM | POA: Diagnosis not present

## 2021-08-17 DIAGNOSIS — R531 Weakness: Secondary | ICD-10-CM | POA: Diagnosis not present

## 2021-08-17 DIAGNOSIS — R7303 Prediabetes: Secondary | ICD-10-CM | POA: Diagnosis not present

## 2021-08-18 ENCOUNTER — Other Ambulatory Visit: Payer: Self-pay | Admitting: Internal Medicine

## 2021-08-18 DIAGNOSIS — R531 Weakness: Secondary | ICD-10-CM

## 2021-08-22 ENCOUNTER — Ambulatory Visit
Admission: RE | Admit: 2021-08-22 | Discharge: 2021-08-22 | Disposition: A | Discharge status: Home / Self Care | Payer: PPO | Source: Ambulatory Visit | Attending: Internal Medicine | Admitting: Internal Medicine

## 2021-08-22 DIAGNOSIS — R531 Weakness: Secondary | ICD-10-CM

## 2021-08-22 DIAGNOSIS — R29898 Other symptoms and signs involving the musculoskeletal system: Secondary | ICD-10-CM | POA: Diagnosis not present

## 2021-09-20 DIAGNOSIS — M170 Bilateral primary osteoarthritis of knee: Secondary | ICD-10-CM | POA: Diagnosis not present

## 2021-09-20 DIAGNOSIS — M1909 Primary osteoarthritis, other specified site: Secondary | ICD-10-CM | POA: Diagnosis not present

## 2021-09-20 DIAGNOSIS — M19042 Primary osteoarthritis, left hand: Secondary | ICD-10-CM | POA: Diagnosis not present

## 2021-09-20 DIAGNOSIS — M19041 Primary osteoarthritis, right hand: Secondary | ICD-10-CM | POA: Diagnosis not present

## 2021-09-20 DIAGNOSIS — I480 Paroxysmal atrial fibrillation: Secondary | ICD-10-CM | POA: Diagnosis not present

## 2021-09-20 DIAGNOSIS — I739 Peripheral vascular disease, unspecified: Secondary | ICD-10-CM | POA: Diagnosis not present

## 2021-09-20 DIAGNOSIS — Z978 Presence of other specified devices: Secondary | ICD-10-CM | POA: Diagnosis not present

## 2021-09-23 DIAGNOSIS — M48062 Spinal stenosis, lumbar region with neurogenic claudication: Secondary | ICD-10-CM | POA: Diagnosis not present

## 2021-09-23 DIAGNOSIS — M5416 Radiculopathy, lumbar region: Secondary | ICD-10-CM | POA: Diagnosis not present

## 2021-09-23 DIAGNOSIS — M5136 Other intervertebral disc degeneration, lumbar region: Secondary | ICD-10-CM | POA: Diagnosis not present

## 2021-09-27 ENCOUNTER — Other Ambulatory Visit: Payer: Self-pay | Admitting: *Deleted

## 2021-09-27 DIAGNOSIS — L821 Other seborrheic keratosis: Secondary | ICD-10-CM | POA: Diagnosis not present

## 2021-09-27 MED ORDER — LETROZOLE 2.5 MG PO TABS: 2.5000 mg | ORAL_TABLET | Freq: Every day | ORAL | 0 refills | Status: DC

## 2021-09-28 ENCOUNTER — Other Ambulatory Visit: Payer: Self-pay | Admitting: *Deleted

## 2021-09-28 MED ORDER — LETROZOLE 2.5 MG PO TABS: 2.5000 mg | ORAL_TABLET | Freq: Every day | ORAL | 0 refills | Status: DC

## 2021-09-28 NOTE — Telephone Encounter (Signed)
Patient called asking that the prescription sent to [facility] yesterday be sent to [facility]. Prescription sent to [facility] and I called [facility] in [place] to cancel prescription there

## 2021-09-29 ENCOUNTER — Inpatient Hospital Stay: Payer: PPO | Attending: Oncology

## 2021-09-29 ENCOUNTER — Ambulatory Visit
Admission: RE | Admit: 2021-09-29 | Discharge: 2021-09-29 | Disposition: A | Discharge status: Home / Self Care | Payer: PPO | Source: Ambulatory Visit | Attending: Oncology | Admitting: Oncology

## 2021-09-29 DIAGNOSIS — C50911 Malignant neoplasm of unspecified site of right female breast: Secondary | ICD-10-CM | POA: Diagnosis not present

## 2021-09-29 DIAGNOSIS — M47814 Spondylosis without myelopathy or radiculopathy, thoracic region: Secondary | ICD-10-CM | POA: Diagnosis not present

## 2021-09-29 DIAGNOSIS — Z170 Estrogen receptor positive status [ER+]: Secondary | ICD-10-CM | POA: Insufficient documentation

## 2021-09-29 DIAGNOSIS — C50919 Malignant neoplasm of unspecified site of unspecified female breast: Secondary | ICD-10-CM | POA: Insufficient documentation

## 2021-09-29 DIAGNOSIS — I2510 Atherosclerotic heart disease of native coronary artery without angina pectoris: Secondary | ICD-10-CM | POA: Diagnosis not present

## 2021-09-29 DIAGNOSIS — J984 Other disorders of lung: Secondary | ICD-10-CM | POA: Diagnosis not present

## 2021-09-29 DIAGNOSIS — Z79811 Long term (current) use of aromatase inhibitors: Secondary | ICD-10-CM | POA: Insufficient documentation

## 2021-09-29 DIAGNOSIS — C782 Secondary malignant neoplasm of pleura: Secondary | ICD-10-CM | POA: Diagnosis not present

## 2021-09-29 DIAGNOSIS — K449 Diaphragmatic hernia without obstruction or gangrene: Secondary | ICD-10-CM | POA: Diagnosis not present

## 2021-09-29 DIAGNOSIS — R918 Other nonspecific abnormal finding of lung field: Secondary | ICD-10-CM | POA: Diagnosis not present

## 2021-09-29 DIAGNOSIS — K6389 Other specified diseases of intestine: Secondary | ICD-10-CM | POA: Diagnosis not present

## 2021-09-29 DIAGNOSIS — Z1509 Genetic susceptibility to other malignant neoplasm: Secondary | ICD-10-CM | POA: Insufficient documentation

## 2021-09-29 DIAGNOSIS — I700 Atherosclerosis of aorta: Secondary | ICD-10-CM | POA: Insufficient documentation

## 2021-09-29 DIAGNOSIS — K5730 Diverticulosis of large intestine without perforation or abscess without bleeding: Secondary | ICD-10-CM | POA: Diagnosis not present

## 2021-09-29 DIAGNOSIS — Z1501 Genetic susceptibility to malignant neoplasm of breast: Secondary | ICD-10-CM | POA: Insufficient documentation

## 2021-09-29 LAB — COMPREHENSIVE METABOLIC PANEL
ALT: 13 U/L (ref 0–44)
AST: 24 U/L (ref 15–41)
Albumin: 3.9 g/dL (ref 3.5–5.0)
Alkaline Phosphatase: 66 U/L (ref 38–126)
Anion gap: 8 (ref 5–15)
BUN: 11 mg/dL (ref 8–23)
CO2: 26 mmol/L (ref 22–32)
Calcium: 8.7 mg/dL — ABNORMAL LOW (ref 8.9–10.3)
Chloride: 104 mmol/L (ref 98–111)
Creatinine, Ser: 0.78 mg/dL (ref 0.44–1.00)
GFR, Estimated: >60 mL/min (ref >60)
Glucose, Bld: 119 mg/dL — ABNORMAL HIGH (ref 70–99)
Potassium: 4.1 mmol/L (ref 3.5–5.1)
Sodium: 138 mmol/L (ref 135–145)
Total Bilirubin: 0.7 mg/dL (ref 0.3–1.2)
Total Protein: 7.1 g/dL (ref 6.5–8.1)

## 2021-09-29 LAB — CBC WITH DIFFERENTIAL/PLATELET
Abs Immature Granulocytes: 0.01 10*3/uL (ref 0.00–0.07)
Basophils Absolute: 0.1 10*3/uL (ref 0.0–0.1)
Basophils Relative: 1 %
Eosinophils Absolute: 0.1 10*3/uL (ref 0.0–0.5)
Eosinophils Relative: 1 %
HCT: 38.6 % (ref 36.0–46.0)
Hemoglobin: 12.3 g/dL (ref 12.0–15.0)
Immature Granulocytes: 0 %
Lymphocytes Relative: 25 %
Lymphs Abs: 2 10*3/uL (ref 0.7–4.0)
MCH: 28.7 pg (ref 26.0–34.0)
MCHC: 31.9 g/dL (ref 30.0–36.0)
MCV: 90.2 fL (ref 80.0–100.0)
Monocytes Absolute: 0.8 10*3/uL (ref 0.1–1.0)
Monocytes Relative: 10 %
Neutro Abs: 5.1 10*3/uL (ref 1.7–7.7)
Neutrophils Relative %: 63 %
Platelets: 219 10*3/uL (ref 150–400)
RBC: 4.28 MIL/uL (ref 3.87–5.11)
RDW: 12.5 % (ref 11.5–15.5)
WBC: 8.1 10*3/uL (ref 4.0–10.5)
nRBC: 0 % (ref 0.0–0.2)

## 2021-09-29 MED ORDER — IOHEXOL 300 MG/ML  SOLN
80.0000 mL | Freq: Once | INTRAMUSCULAR | Status: AC | PRN
  Administered 2021-09-29: 80 mL via INTRAVENOUS

## 2021-10-03 ENCOUNTER — Encounter: Payer: Self-pay | Admitting: Oncology

## 2021-10-03 ENCOUNTER — Other Ambulatory Visit: Payer: Self-pay

## 2021-10-03 ENCOUNTER — Inpatient Hospital Stay (HOSPITAL_BASED_OUTPATIENT_CLINIC_OR_DEPARTMENT_OTHER): Payer: PPO | Admitting: Oncology

## 2021-10-03 VITALS — BP 144/81 | HR 81 | Temp 98.2°F | Resp 18 | Wt 170.5 lb

## 2021-10-03 DIAGNOSIS — Z79811 Long term (current) use of aromatase inhibitors: Secondary | ICD-10-CM | POA: Diagnosis not present

## 2021-10-03 DIAGNOSIS — C50911 Malignant neoplasm of unspecified site of right female breast: Secondary | ICD-10-CM | POA: Diagnosis not present

## 2021-10-03 DIAGNOSIS — C50919 Malignant neoplasm of unspecified site of unspecified female breast: Secondary | ICD-10-CM

## 2021-10-03 MED ORDER — ONDANSETRON HCL 4 MG PO TABS: 4.0000 mg | ORAL_TABLET | Freq: Four times a day (QID) | ORAL | 1 refills | Status: DC | PRN

## 2021-10-03 MED ORDER — LETROZOLE 2.5 MG PO TABS: 2.5000 mg | ORAL_TABLET | Freq: Every day | ORAL | 0 refills | Status: DC

## 2021-10-03 NOTE — Progress Notes (Signed)
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  She underwent mastectomy followed by adjuvant chemotherapy and 5 years of tamoxifen.  Patient has been having some low back pain and underwent a CT lumbar spine without contrast on 08/10/2017 which was done by the pain clinic.  CT mainly showed age-related degenerative disease but also showed incidental nodular thickening of the posterior left diaphragm concerning for tumor and a CT chest was recommended.   CT chest on 08/14/2017 showed scattered left pleural nodularity with tiny left pleural effusion and prominent left internal mammary and juxtadiaphragmatic lymph nodes which are nonspecific but metastatic disease could not be ruled out.   This was followed by a PET/CT scan on 08/22/2017 which showed numerous small left-sided pleural nodules which were mildly hypermetabolic along with hypermetabolic mediastinal lymph nodes concerning for metastatic disease.  Small metastatic nodule in the left upper quadrant partly in the retrocrural space.  No findings of pulmonary metastatic or osseous metastatic disease.    Patient underwent FNA of retrocrural LN which was positive for carcinoma but primary could not be ascertained. She then underwent pleural biopsy which  showed metastatic carcinoma consistent with breast primary. ER+ HER-2.  Patient started taking Ibrance in August 2019.  Ibrance on hold since January 2021 with stable disease on letrozole   NGS testing showed PIKCA and BRCA mutation    Interval history-tolerating single agent letrozole well without any significant side effects.  She has baseline left hip pain and ambulates with the help of a walker.  This is unrelated to her breast cancer.  ECOG PS- 1 Pain scale- 2 Opioid associated constipation- no  Review of systems- Review of Systems  Constitutional:  Negative for chills, fever, malaise/fatigue and weight loss.  HENT:  Negative for congestion, ear discharge and nosebleeds.   Eyes:  Negative for blurred vision.  Respiratory:  Negative for cough, hemoptysis, sputum production, shortness of breath and wheezing.   Cardiovascular:  Negative for chest pain, palpitations, orthopnea and claudication.  Gastrointestinal:  Negative for abdominal pain, blood in stool, constipation, diarrhea, heartburn, melena, nausea and vomiting.  Genitourinary:  Negative for dysuria, flank pain, frequency, hematuria and urgency.  Musculoskeletal:  Positive for joint pain. Negative for back pain and myalgias.  Skin:  Negative for rash.  Neurological:  Negative for dizziness, tingling, focal weakness, seizures, weakness and headaches.  Endo/Heme/Allergies:  Does not bruise/bleed easily.  Psychiatric/Behavioral:  Negative for depression and suicidal ideas. The patient does not have insomnia.       Allergies  Allergen Reactions   Imipramine Tinitus   Latex Rash   Penicillins Rash   Tape Rash     Past Medical History:  Diagnosis Date   Anxiety    Atrial fibrillation (HCC)    Breast cancer (HCC)    Breast cancer, right ([place]) 1999   RT MASTECTOMY and chemo  tx's.    DDD (degenerative disc disease), cervical    hands and back with arthritis   Depression    Diverticulosis    Diverticulosis    DVT of  lower extremity (deep venous thrombosis) (HCC)    Dyspnea    Family history of colon cancer    Family history of lung cancer    Family history of throat cancer    GERD (gastroesophageal reflux disease)    Goiter    Headache    History of chemotherapy 2000   BREAST CA   Hypertension    Incontinence    Neuromuscular disorder ([place])    peipheral neuropathy   OSA (obstructive sleep apnea)    Osteoporosis    Personal history of chemotherapy 1999   BREAST CA   Status post chemotherapy    2000 right breast cancer   Thyroid disease    UTI (urinary tract infection)    Vertigo      Past Surgical History:  Procedure Laterality Date   ABDOMINAL HYSTERECTOMY     BREAST BIOPSY Left 2006   negative   BUNIONECTOMY Bilateral    Screws implanted.   CHOLECYSTECTOMY     COCHLEAR IMPLANT Right    COLONOSCOPY WITH PROPOFOL N/A 07/24/2016   Procedure: COLONOSCOPY WITH PROPOFOL;  Surgeon: Manya Silvas, MD;  Location: [facility] ENDOSCOPY;  Service: Endoscopy;  Laterality: N/A;   ESOPHAGOGASTRODUODENOSCOPY (EGD) WITH PROPOFOL N/A 04/12/2016   Procedure: ESOPHAGOGASTRODUODENOSCOPY (EGD) WITH PROPOFOL;  Surgeon: Manya Silvas, MD;  Location: [facility] ENDOSCOPY;  Service: Endoscopy;  Laterality: N/A;   EYE SURGERY Bilateral    cataract   FOOT SURGERY Bilateral    HAND SURGERY Right    KYPHOPLASTY N/A 05/20/2020   Procedure: L3 KYPHOPLASTY;  Surgeon: Hessie Knows, MD;  Location: [facility] ORS;  Service: Orthopedics;  Laterality: N/A;   MASTECTOMY Right 1999   BREAST CA   MASTECTOMY Right 1999   PARTIAL COLECTOMY     THYROIDECTOMY     TOTAL HIP ARTHROPLASTY Left 03/19/2018   Procedure: TOTAL HIP ARTHROPLASTY LEFT;  Surgeon: Hessie Knows, MD;  Location: [facility] ORS;  Service: Orthopedics;  Laterality: Left;   VIDEO ASSISTED THORACOSCOPY Left 09/10/2017   Procedure: VIDEO ASSISTED THORACOSCOPY;  Surgeon: [name], MD;  Location: [facility] ORS;  Service: Thoracic;  Laterality: Left;  with biopsies   VIDEO  BRONCHOSCOPY  09/10/2017   Procedure: VIDEO BRONCHOSCOPY;  Surgeon: [name], MD;  Location: [facility] ORS;  Service: Thoracic;;    Social History   Socioeconomic History   Marital status: Married    Spouse name: evert   Number of children: 4   Years of education: Not on file   Highest education level: 9th grade  Occupational History   Not on file  Tobacco Use   Smoking status: Never   Smokeless tobacco: Never  Vaping Use   Vaping Use: Never used  Substance and Sexual Activity   Alcohol use: No    Alcohol/week: 0.0 standard drinks of alcohol   Drug use: No   Sexual activity: Not Currently  Other Topics Concern   Not on file  Social History Narrative   Not on file   Social Determinants of Health   Financial Resource Strain: Medium Risk (09/24/2017)   Overall Financial Resource Strain (CARDIA)    Difficulty of Paying Living Expenses: Somewhat hard  Food Insecurity: Food Insecurity Present (09/24/2017)   Hunger Vital Sign    Worried About Running Out of Food in the Last Year: Often true  Ran Out of Food in the Last Year: Often true  Transportation Needs: No Transportation Needs (09/24/2017)   PRAPARE - Hydrologist (Medical): No    Lack of Transportation (Non-Medical): No  Physical Activity: Inactive (09/24/2017)   Exercise Vital Sign    Days of Exercise per Week: 0 days    Minutes of Exercise per Session: 0 min  Stress: No Stress Concern Present (09/24/2017)   [place]    Feeling of Stress : Not at all  Social Connections: Unknown (09/24/2017)   Social Connection and Isolation Panel [NHANES]    Frequency of Communication with Friends and Family: Not on file    Frequency of Social Gatherings with Friends and Family: Not on file    Attends Religious Services: More than 4 times per year    Active Member of [employer] or Organizations: No    Attends Archivist Meetings:  Never    Marital Status: Married  Human resources officer Violence: Not At Risk (09/24/2017)   Humiliation, Afraid, Rape, and Kick questionnaire    Fear of Current or Ex-Partner: No    Emotionally Abused: No    Physically Abused: No    Sexually Abused: No    Family History  Problem Relation Age of Onset   Dementia Mother    Dementia Sister    Diabetes Sister    Heart attack Brother 99   COPD Brother    Lung cancer Brother        hx smoking   Colon cancer Brother    Liver cancer Brother    COPD Brother    Lung cancer Brother        hx smoking   Colon cancer Maternal Grandfather        dx >.50   Breast cancer Neg Hx      Current Outpatient Medications:    acetaminophen (TYLENOL) 325 MG tablet, Take 650 mg by mouth every 4 (four) hours as needed. For pain / increased temp.  May be administered orally,  per G-Tube if needed or rectally if unable to swallow (separate order).  Maximum dose for 24 hours is 3,000 mg from all sources of Acetaminophen / Tylenol, Disp: , Rfl:    atorvastatin (LIPITOR) 40 MG tablet, Take 1 tablet (40 mg total) by mouth at bedtime., Disp: 30 tablet, Rfl: 0   Baclofen 5 MG TABS, Take 1 tablet by mouth 2 (two) times daily., Disp: , Rfl:    busPIRone (BUSPAR) 10 MG tablet, Take 1 tablet (10 mg total) by mouth 2 (two) times daily., Disp: 180 tablet, Rfl: 1   cholecalciferol (VITAMIN D) 1000 units tablet, Take 1,000 Units by mouth daily., Disp: , Rfl:    DULoxetine (CYMBALTA) 60 MG capsule, Take 1 capsule (60 mg total) by mouth every morning., Disp: 90 capsule, Rfl: 1   FLUAD QUADRIVALENT 0.5 ML injection, , Disp: , Rfl:    gabapentin (NEURONTIN) 300 MG capsule, Take 300 mg by mouth 2 (two) times daily., Disp: , Rfl:    HYDROcodone-acetaminophen (NORCO/VICODIN) 5-325 MG tablet, Take by mouth., Disp: , Rfl:    levothyroxine (SYNTHROID) 100 MCG tablet, Take 1 tablet by mouth daily., Disp: , Rfl:    pantoprazole (PROTONIX) 40 MG tablet, Take 1 tablet (40 mg total) by  mouth 2 (two) times daily., Disp: 60 tablet, Rfl: 0   traZODone (DESYREL) 100 MG tablet, TAKE 1 TABLET BY MOUTH EVERYDAY AT BEDTIME, Disp: 90  tablet, Rfl: 1   vitamin B-12 (CYANOCOBALAMIN) 500 MCG tablet, Take 500 mcg by mouth daily. , Disp: , Rfl:    letrozole (FEMARA) 2.5 MG tablet, Take 1 tablet (2.5 mg total) by mouth daily., Disp: 30 tablet, Rfl: 0   MODERNA COVID-19 BIVAL BOOSTER 50 MCG/0.5ML injection, , Disp: , Rfl:    MODERNA COVID-19 VACCINE 100 MCG/0.5ML injection, , Disp: , Rfl:    MYRBETRIQ 50 MG TB24 tablet, TAKE 1 TABLET BY MOUTH EVERY DAY, Disp: 30 tablet, Rfl: 11   ondansetron (ZOFRAN) 4 MG tablet, Take 1 tablet (4 mg total) by mouth every 6 (six) hours as needed for nausea or vomiting., Disp: 30 tablet, Rfl: 1   predniSONE (DELTASONE) 10 MG tablet, , Disp: , Rfl:   Physical exam:  Vitals:   10/03/21 1115  BP: (!) 144/81  Pulse: 81  Resp: 18  Temp: 98.2 F (36.8 C)  SpO2: 97%  Weight: 170 lb 8 oz (77.3 kg)   Physical Exam Constitutional:      General: She is not in acute distress.    Comments: Ambulates with the help of a walker  Cardiovascular:     Rate and Rhythm: Normal rate and regular rhythm.     Heart sounds: Normal heart sounds.  Pulmonary:     Effort: Pulmonary effort is normal.     Breath sounds: Normal breath sounds.  Abdominal:     General: Bowel sounds are normal.     Palpations: Abdomen is soft.  Musculoskeletal:     Cervical back: Normal range of motion.  Skin:    General: Skin is warm and dry.  Neurological:     Mental Status: She is alert and oriented to person, place, and time.         Latest Ref Rng & Units 09/29/2021    8:04 AM  CMP  Glucose 70 - 99 mg/dL 119   BUN 8 - 23 mg/dL 11   Creatinine 0.44 - 1.00 mg/dL 0.78   Sodium 135 - 145 mmol/L 138   Potassium 3.5 - 5.1 mmol/L 4.1   Chloride 98 - 111 mmol/L 104   CO2 22 - 32 mmol/L 26   Calcium 8.9 - 10.3 mg/dL 8.7   Total Protein 6.5 - 8.1 g/dL 7.1   Total Bilirubin 0.3 - 1.2  mg/dL 0.7   Alkaline Phos 38 - 126 U/L 66   AST 15 - 41 U/L 24   ALT 0 - 44 U/L 13       Latest Ref Rng & Units 09/29/2021    8:04 AM  CBC  WBC 4.0 - 10.5 K/uL 8.1   Hemoglobin 12.0 - 15.0 g/dL 12.3   Hematocrit 36.0 - 46.0 % 38.6   Platelets 150 - 400 K/uL 219     No images are attached to the encounter.  CT CHEST ABDOMEN PELVIS W CONTRAST  Result Date: 09/30/2021 CLINICAL DATA:  Restaging metastatic breast cancer. * Tracking Code: BO * EXAM: CT CHEST, ABDOMEN, AND PELVIS WITH CONTRAST TECHNIQUE: Multidetector CT imaging of the chest, abdomen and pelvis was performed following the standard protocol during bolus administration of intravenous contrast. RADIATION DOSE REDUCTION: This exam was performed according to the departmental dose-optimization program which includes automated exposure control, adjustment of the mA and/or kV according to patient size and/or use of iterative reconstruction technique. CONTRAST:  17m OMNIPAQUE IOHEXOL 300 MG/ML  SOLN COMPARISON:  Prior CT 09/23/2020.  Whole-body bone scan 09/23/2020. FINDINGS: CT CHEST FINDINGS Cardiovascular: No acute  vascular findings. Mild atherosclerosis of the aorta, great vessels and coronary arteries. The heart size is normal. There is no pericardial effusion. Mediastinum/Nodes: There are no enlarged mediastinal, hilar, axillary or internal mammary lymph nodes. Previous right axillary node dissection. Moderate size hiatal hernia with stable distal esophageal wall thickening. Lungs/Pleura: No pleural effusion or pneumothorax. Stable mild scarring at the left lung base and scattered small benign pulmonary nodules, including a 4 mm perifissural nodule on the left (79/5). No suspicious pulmonary nodules. Musculoskeletal/Chest wall: No chest wall mass or suspicious osseous findings. Previous right mastectomy and axillary node dissection. Mild thoracic spondylosis. CT ABDOMEN AND PELVIS FINDINGS Hepatobiliary: The liver is normal in density  without suspicious focal abnormality. Stable mild extrahepatic biliary dilatation post cholecystectomy, within physiologic limits. Pancreas: Unremarkable. No pancreatic ductal dilatation or surrounding inflammatory changes. Spleen: Normal in size without focal abnormality. Adrenals/Urinary Tract: Both adrenal glands appear normal. No suspicious renal lesion, hydronephrosis or definite urinary tract calculus. The bladder is partly obscured by artifact from the left hip arthroplasty. Probable postsurgical changes at the bladder neck, unchanged. Stomach/Bowel: Enteric contrast was administered and has passed into the proximal colon. Status post right hemicolectomy with anastomosis. As above, moderate-size hiatal hernia. No evidence of bowel distension, wall thickening or surrounding inflammation. Moderate stool throughout the colon and mild distal colonic diverticulosis. Vascular/Lymphatic: There are no enlarged abdominal or pelvic lymph nodes. Aortic and branch vessel atherosclerosis without aneurysm or large vessel occlusion. Reproductive: Hysterectomy. Stable appearance of the left ovary. No evidence of adnexal mass. Other: No evidence of abdominal wall mass or hernia. No ascites. Musculoskeletal: No acute or significant osseous findings. Previous spinal augmentation at L3. Previous left total hip arthroplasty. IMPRESSION: 1. Stable CTs of the chest, abdomen and pelvis. No evidence of local recurrence of breast cancer or metastatic disease. 2. Stable moderate-size hiatal hernia with distal esophageal wall thickening. 3. Additional postsurgical changes as described. 4. Coronary and Aortic Atherosclerosis (ICD10-I70.0). Electronically Signed   By: Richardean Sale M.D.   On: 09/30/2021 09:47     Assessment and plan- Patient is a 81 y.o. female with metastatic ER positive breast cancer with pleural nodules here for routine follow-up  Clinically patient is doing well with no concerning signs and symptoms of  recurrence.  She had a CT chest abdomen and pelvis with contrast on 09/29/2021 which did not show any evidence of Recurrent or progressive disease.  Pleural-based nodules have nearly resolved.  She will continue with letrozole until progression or toxicity and I will see her back in 6 months with repeat scans and bone density scan prior   Visit Diagnosis 1. Use of letrozole ([place])   2. Malignant neoplasm of female breast, unspecified estrogen receptor status, unspecified laterality, unspecified site of breast ([place])      Dr. Randa Evens, MD, MPH [facility] [phone] 10/03/2021 3:54 PM

## 2021-10-03 NOTE — Progress Notes (Signed)
Pt will like to know if she needs to continue taking letrozole since they are no refills available. Also, pt stated she had a fall at church a couple of weeks ago someone caught her from falling but when getting home her speech become slurred and the rt side of her head felt funny. Per Dr. Caryl Comes seems like pt had a small stroke but pt did not get checked out. Dr. Caryl Comes informed her if that ever happened again to go to ER to determine a stroke. Also, deals with weakness spells.

## 2021-10-04 ENCOUNTER — Telehealth: Payer: Self-pay | Admitting: *Deleted

## 2021-10-04 NOTE — Telephone Encounter (Signed)
Pt needs another bone density and last time it was done was at [facility] 05/2019. I have sent a fax to Dr. Caryl Comes to ask for him to set up the next scan 05/2022. If pt changes to a new machine then we can't compare the results from one machine to another. Also asked him on the fax to call us back if the can't be done. Fax went through.faxed [phone]

## 2021-10-20 ENCOUNTER — Ambulatory Visit: Payer: PPO | Admitting: Physician Assistant

## 2021-10-24 ENCOUNTER — Other Ambulatory Visit: Payer: Self-pay | Admitting: *Deleted

## 2021-10-24 MED ORDER — LETROZOLE 2.5 MG PO TABS: 2.5000 mg | ORAL_TABLET | Freq: Every day | ORAL | 1 refills | Status: DC

## 2021-10-25 DIAGNOSIS — M48062 Spinal stenosis, lumbar region with neurogenic claudication: Secondary | ICD-10-CM | POA: Diagnosis not present

## 2021-10-25 DIAGNOSIS — M5416 Radiculopathy, lumbar region: Secondary | ICD-10-CM | POA: Diagnosis not present

## 2021-10-31 ENCOUNTER — Ambulatory Visit
Admission: RE | Admit: 2021-10-31 | Discharge: 2021-10-31 | Disposition: A | Discharge status: Home / Self Care | Payer: PPO | Source: Ambulatory Visit | Attending: Oncology | Admitting: Oncology

## 2021-10-31 DIAGNOSIS — C50919 Malignant neoplasm of unspecified site of unspecified female breast: Secondary | ICD-10-CM | POA: Diagnosis not present

## 2021-10-31 DIAGNOSIS — Z853 Personal history of malignant neoplasm of breast: Secondary | ICD-10-CM | POA: Insufficient documentation

## 2021-10-31 DIAGNOSIS — M8589 Other specified disorders of bone density and structure, multiple sites: Secondary | ICD-10-CM | POA: Insufficient documentation

## 2021-10-31 DIAGNOSIS — Z780 Asymptomatic menopausal state: Secondary | ICD-10-CM | POA: Insufficient documentation

## 2021-10-31 DIAGNOSIS — Z13820 Encounter for screening for osteoporosis: Secondary | ICD-10-CM | POA: Diagnosis not present

## 2021-11-03 DIAGNOSIS — H353221 Exudative age-related macular degeneration, left eye, with active choroidal neovascularization: Secondary | ICD-10-CM | POA: Diagnosis not present

## 2021-11-03 DIAGNOSIS — H353211 Exudative age-related macular degeneration, right eye, with active choroidal neovascularization: Secondary | ICD-10-CM | POA: Diagnosis not present

## 2021-11-03 DIAGNOSIS — H353231 Exudative age-related macular degeneration, bilateral, with active choroidal neovascularization: Secondary | ICD-10-CM | POA: Diagnosis not present

## 2021-11-16 ENCOUNTER — Encounter: Payer: Self-pay | Admitting: Psychiatry

## 2021-11-16 ENCOUNTER — Ambulatory Visit (INDEPENDENT_AMBULATORY_CARE_PROVIDER_SITE_OTHER): Payer: PPO | Admitting: Psychiatry

## 2021-11-16 VITALS — BP 116/71 | HR 89 | Temp 98.5°F | Wt 172.6 lb

## 2021-11-16 DIAGNOSIS — F5101 Primary insomnia: Secondary | ICD-10-CM

## 2021-11-16 DIAGNOSIS — F4310 Post-traumatic stress disorder, unspecified: Secondary | ICD-10-CM | POA: Diagnosis not present

## 2021-11-16 DIAGNOSIS — F330 Major depressive disorder, recurrent, mild: Secondary | ICD-10-CM | POA: Diagnosis not present

## 2021-11-16 DIAGNOSIS — G3184 Mild cognitive impairment, so stated: Secondary | ICD-10-CM | POA: Diagnosis not present

## 2021-11-16 MED ORDER — BUSPIRONE HCL 10 MG PO TABS: 10.0000 mg | ORAL_TABLET | Freq: Two times a day (BID) | ORAL | 1 refills | Status: DC

## 2021-11-16 MED ORDER — DULOXETINE HCL 60 MG PO CPEP: 60.0000 mg | ORAL_CAPSULE | Freq: Every morning | ORAL | 1 refills | Status: DC

## 2021-11-16 MED ORDER — TRAZODONE HCL 100 MG PO TABS: ORAL_TABLET | ORAL | 1 refills | Status: DC

## 2021-11-16 NOTE — Progress Notes (Signed)
[place] [facility] MD OP Progress Note  11/16/2021 1:05 PM Breanna Zhang  MRN:  [id number]  Chief Complaint:  Chief Complaint  Patient presents with   Follow-up: 81 year old Caucasian female, married, has a history of MDD, PTSD, insomnia, presented for medication management.   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  Patient today reports she has been struggling with age-related aches and pains, other problems like hearing loss, which has been frustrating.  She also reports she has been having dizziness on and off, gait imbalance when she walks at times.  Reports her primary care provider completed a CT scan of her brain which did not show any acute changes.  Patient reports she is not interested in referral for physical therapy.  Reports she does not drive anymore.  That is also frustrating for her.  She has to wait for her husband to take her to places.  There has been times when she just wanted to get out and drive however she did not act on those thoughts.  She does go to the church with her spouse once a month or so.  She reports she does feel sad about her current situational stressors although she is not interested in making more medication changes.  Reports she sleeps around 8 hours at night.  Also takes 30 minutes to an hour nap during the day.  That does help her.  She is on trazodone and would like to stay on this medication.  Denies side effects.  Patient denies any suicidality, homicidality or perceptual disturbances.  Does report memory problems, mostly short-term memory loss.  An MMSE was completed in session today  and she scored 22 out of 30.  Patient denies any other concerns today.  Visit Diagnosis:    ICD-10-CM   1. MDD (major depressive disorder), recurrent episode, mild (HCC)  F33.0 traZODone (DESYREL) 100 MG tablet    DULoxetine (CYMBALTA) 60 MG capsule    busPIRone (BUSPAR) 10 MG tablet    DISCONTINUED: traZODone (DESYREL) 100 MG tablet    DISCONTINUED: DULoxetine (CYMBALTA) 60 MG capsule    DISCONTINUED: busPIRone (BUSPAR) 10 MG tablet    2. Post traumatic stress disorder (PTSD)  F43.10 DULoxetine (CYMBALTA) 60 MG capsule    busPIRone (BUSPAR) 10 MG tablet    DISCONTINUED: DULoxetine (CYMBALTA) 60 MG capsule    DISCONTINUED: busPIRone (BUSPAR) 10 MG tablet    3. Primary insomnia  F51.01     4. Mild cognitive impairment  G31.84       Past Psychiatric History: Reviewed past psychiatric history from progress note on 04/11/2021.  Past Medical History:  Past Medical History:  Diagnosis Date   Anxiety    Atrial fibrillation ([place])    Breast cancer ([place])    Breast cancer, right ([place]) 1999   RT MASTECTOMY and chemo tx's.    DDD (degenerative disc disease), cervical    hands and back with arthritis   Depression    Diverticulosis    Diverticulosis    DVT of lower extremity (deep venous thrombosis) (HCC)    Dyspnea    Family history of colon cancer    Family history of lung cancer    Family history of throat cancer  GERD (gastroesophageal reflux disease)    Goiter    Headache    History of chemotherapy 2000   BREAST CA   Hypertension    Incontinence    Neuromuscular disorder (HCC)    peipheral neuropathy   OSA (obstructive sleep apnea)    Osteoporosis    Personal history of chemotherapy 1999   BREAST CA   Status post chemotherapy    2000 right breast cancer   Thyroid disease    UTI (urinary tract infection)    Vertigo     Past Surgical History:  Procedure Laterality Date   ABDOMINAL HYSTERECTOMY     BREAST BIOPSY Left 2006   negative   BUNIONECTOMY Bilateral     Screws implanted.   CHOLECYSTECTOMY     COCHLEAR IMPLANT Right    COLONOSCOPY WITH PROPOFOL N/A 07/24/2016   Procedure: COLONOSCOPY WITH PROPOFOL;  Surgeon: Manya Silvas, MD;  Location[facility] ENDOSCOPY;  Service: Endoscopy;  Laterality: N/A;   ESOPHAGOGASTRODUODENOSCOPY (EGD) WITH PROPOFOL N/A 04/12/2016   Procedure: ESOPHAGOGASTRODUODENOSCOPY (EGD) WITH PROPOFOL;  Surgeon: Manya Silvas, MD;  Location: [facility] ENDOSCOPY;  Service: Endoscopy;  Laterality: N/A;   EYE SURGERY Bilateral    cataract   FOOT SURGERY Bilateral    HAND SURGERY Right    KYPHOPLASTY N/A 05/20/2020   Procedure: L3 KYPHOPLASTY;  Surgeon: Hessie Knows, MD;  Location: [facility] ORS;  Service: Orthopedics;  Laterality: N/A;   MASTECTOMY Right 1999   BREAST CA   MASTECTOMY Right 1999   PARTIAL COLECTOMY     THYROIDECTOMY     TOTAL HIP ARTHROPLASTY Left 03/19/2018   Procedure: TOTAL HIP ARTHROPLASTY LEFT;  Surgeon: Hessie Knows, MD;  Location: [facility] ORS;  Service: Orthopedics;  Laterality: Left;   VIDEO ASSISTED THORACOSCOPY Left 09/10/2017   Procedure: VIDEO ASSISTED THORACOSCOPY;  Surgeon: [name], MD;  Location: [facility] ORS;  Service: Thoracic;  Laterality: Left;  with biopsies   VIDEO BRONCHOSCOPY  09/10/2017   Procedure: VIDEO BRONCHOSCOPY;  Surgeon: [name], MD;  Location: [facility] ORS;  Service: Thoracic;;    Family Psychiatric History: Reviewed family psychiatric history from progress note on 04/11/2021.  Family History:  Family History  Problem Relation Age of Onset   Dementia Mother    Dementia Sister    Diabetes Sister    Heart attack Brother 59   COPD Brother    Lung cancer Brother        hx smoking   Colon cancer Brother    Liver cancer Brother    COPD Brother    Lung cancer Brother        hx smoking   Colon cancer Maternal Grandfather        dx >.50   Breast cancer Neg Hx     Social History: Reviewed social history from progress note on 04/11/2021. Social History   Socioeconomic History    Marital status: Married    Spouse name: evert   Number of children: 4   Years of education: Not on file   Highest education level: 9th grade  Occupational History   Not on file  Tobacco Use   Smoking status: Never   Smokeless tobacco: Never  Vaping Use   Vaping Use: Never used  Substance and Sexual Activity   Alcohol use: No    Alcohol/week: 0.0 standard drinks of alcohol   Drug use: No   Sexual activity: Not Currently  Other Topics Concern   Not on file  Social History Narrative   Not on  file   Social Determinants of Health   Financial Resource Strain: Medium Risk (09/24/2017)   Overall Financial Resource Strain (CARDIA)    Difficulty of Paying Living Expenses: Somewhat hard  Food Insecurity: Food Insecurity Present (09/24/2017)   Hunger Vital Sign    Worried About Running Out of Food in the Last Year: Often true    Ran Out of Food in the Last Year: Often true  Transportation Needs: No Transportation Needs (09/24/2017)   PRAPARE - Hydrologist (Medical): No    Lack of Transportation (Non-Medical): No  Physical Activity: Inactive (09/24/2017)   Exercise Vital Sign    Days of Exercise per Week: 0 days    Minutes of Exercise per Session: 0 min  Stress: No Stress Concern Present (09/24/2017)   [place]    Feeling of Stress : Not at all  Social Connections: Unknown (09/24/2017)   Social Connection and Isolation Panel [NHANES]    Frequency of Communication with Friends and Family: Not on file    Frequency of Social Gatherings with Friends and Family: Not on file    Attends Religious Services: More than 4 times per year    Active Member of [employer] or Organizations: No    Attends Archivist Meetings: Never    Marital Status: Married    Allergies:  Allergies  Allergen Reactions   Imipramine Tinitus   Latex Rash   Penicillins Rash   Tape Rash    Metabolic Disorder  Labs: No results found for: "HGBA1C", "MPG" No results found for: "PROLACTIN" No results found for: "CHOL", "TRIG", "HDL", "CHOLHDL", "VLDL", "LDLCALC" Lab Results  Component Value Date   TSH 0.275 (L) 11/09/2012   TSH 0.443 (L) 10/12/2012    Therapeutic Level Labs: No results found for: "LITHIUM" No results found for: "VALPROATE" No results found for: "CBMZ"  Current Medications: Current Outpatient Medications  Medication Sig Dispense Refill   acetaminophen (TYLENOL) 325 MG tablet Take 650 mg by mouth every 4 (four) hours as needed. For pain / increased temp.  May be administered orally,  per G-Tube if needed or rectally if unable to swallow (separate order).  Maximum dose for 24 hours is 3,000 mg from all sources of Acetaminophen / Tylenol     atorvastatin (LIPITOR) 40 MG tablet Take 1 tablet (40 mg total) by mouth at bedtime. 30 tablet 0   Baclofen 5 MG TABS Take 1 tablet by mouth 2 (two) times daily.     cholecalciferol (VITAMIN D) 1000 units tablet Take 1,000 Units by mouth daily.     FLUAD QUADRIVALENT 0.5 ML injection      gabapentin (NEURONTIN) 300 MG capsule Take 300 mg by mouth 2 (two) times daily.     HYDROcodone-acetaminophen (NORCO/VICODIN) 5-325 MG tablet Take by mouth.     letrozole (FEMARA) 2.5 MG tablet Take 1 tablet (2.5 mg total) by mouth daily. 90 tablet 1   MODERNA COVID-19 BIVAL BOOSTER 50 MCG/0.5ML injection      MODERNA COVID-19 VACCINE 100 MCG/0.5ML injection      MYRBETRIQ 50 MG TB24 tablet TAKE 1 TABLET BY MOUTH EVERY DAY 30 tablet 11   ondansetron (ZOFRAN) 4 MG tablet Take 1 tablet (4 mg total) by mouth every 6 (six) hours as needed for nausea or vomiting. 30 tablet 1   pantoprazole (PROTONIX) 40 MG tablet Take 1 tablet (40 mg total) by mouth 2 (two) times daily. 60 tablet 0  vitamin B-12 (CYANOCOBALAMIN) 500 MCG tablet Take 500 mcg by mouth daily.      busPIRone (BUSPAR) 10 MG tablet Take 1 tablet (10 mg total) by mouth 2 (two) times daily. 180 tablet 1    DULoxetine (CYMBALTA) 60 MG capsule Take 1 capsule (60 mg total) by mouth every morning. 90 capsule 1   levothyroxine (SYNTHROID) 100 MCG tablet Take 1 tablet by mouth daily.     traZODone (DESYREL) 100 MG tablet TAKE 1 TABLET BY MOUTH EVERYDAY AT BEDTIME 90 tablet 1   No current facility-administered medications for this visit.     Musculoskeletal: Strength & Muscle Tone: within normal limits Gait & Station:  Walks with cane Patient leans: N/A  Psychiatric Specialty Exam: Review of Systems  HENT:  Positive for hearing loss (chronic).   Musculoskeletal:  Positive for back pain.  Psychiatric/Behavioral:  Positive for decreased concentration and dysphoric mood. The patient is nervous/anxious.   All other systems reviewed and are negative.   Blood pressure 116/71, pulse 89, temperature 98.5 F (36.9 C), temperature source Temporal, weight 172 lb 9.6 oz (78.3 kg).Body mass index is 27.86 kg/m.  General Appearance: Casual  Eye Contact:  Fair  Speech:  Clear and Coherent  Volume:  Normal  Mood:  Dysphoric  Affect:  Congruent  Thought Process:  Goal Directed and Descriptions of Associations: Intact  Orientation:  Other:  month, date, year , place, situation  Thought Content: Logical   Suicidal Thoughts:  No  Homicidal Thoughts:  No  Memory:  Immediate;   Fair Recent;   Fair Remote;   Poor  Judgement:  Fair  Insight:  Fair  Psychomotor Activity:  Normal  Concentration:  Concentration: Fair and Attention Span: Fair  Recall:  [employer] of Knowledge: Fair  Language: Fair  Akathisia:  No  Handed:  Right  AIMS (if indicated): not done  Assets:  Communication Skills Desire for Improvement Housing Social Support  ADL's:  Intact  Cognition: WNL  Sleep:  Fair   Screenings: PHQ2-9    [place] Office Visit from 11/16/2021 in [place] Office Visit from 04/11/2021 in [place]  PHQ-2 Total Score 0 2  PHQ-9  Total Score 5 4      [place] Visit from 11/16/2021 in [place] Office Visit from 04/11/2021 in [place] Video Visit from 11/17/2020 in [place] No Risk No Risk No Risk        Assessment and Plan: CARRINGTON MULLENAX is a 81 year old Caucasian female, married, on SSI, lives in [place] with her husband, has a history of MDD, PTSD, insomnia, breast cancer status post right mastectomy, currently on letrozole, multiple other medical problems continues to struggle with depressive symptoms mostly because of situational stressors, multiple medical problems which are also frustrating, will benefit from the following plan.  Plan MDD-mild-unstable Discussed referral for CBT-patient declines Not interested in making more medication changes, hence we will continue Cymbalta 60 mg p.o. daily.  PTSD-stable Cymbalta 60 mg p.o. daily BuSpar 10 mg p.o. twice daily  Insomnia-stable Trazodone 100 mg p.o. nightly Continue sleep hygiene techniques.  Mild cognitive impairment-unstable MMSE completed today-22 out of 30. Patient had TSH completed-05/18/2021-within normal limits, she is currently on vitamin B12 replacement.  Could repeat vitamin B12 levels. Will reevaluate patient at next visit and will consider referral to neurology if she continues to have memory problems.  Unknown if depression likely  contributing to her current symptoms.  Follow-up in clinic in 6 to 8 weeks or sooner if needed.  This note was generated in part or whole with voice recognition software. Voice recognition is usually quite accurate but there are transcription errors that can and very often do occur. I apologize for any typographical errors that were not detected and corrected.      Ursula Alert, MD 11/17/2021, 8:31 AM

## 2021-11-17 MED ORDER — TRAZODONE HCL 100 MG PO TABS: ORAL_TABLET | ORAL | 1 refills | Status: DC

## 2021-11-17 MED ORDER — BUSPIRONE HCL 10 MG PO TABS: 10.0000 mg | ORAL_TABLET | Freq: Two times a day (BID) | ORAL | 1 refills | Status: DC

## 2021-11-17 MED ORDER — DULOXETINE HCL 60 MG PO CPEP: 60.0000 mg | ORAL_CAPSULE | Freq: Every morning | ORAL | 1 refills | Status: DC

## 2021-11-23 DIAGNOSIS — I10 Essential (primary) hypertension: Secondary | ICD-10-CM | POA: Diagnosis not present

## 2021-11-23 DIAGNOSIS — E785 Hyperlipidemia, unspecified: Secondary | ICD-10-CM | POA: Diagnosis not present

## 2021-11-23 DIAGNOSIS — R739 Hyperglycemia, unspecified: Secondary | ICD-10-CM | POA: Diagnosis not present

## 2021-11-23 DIAGNOSIS — E034 Atrophy of thyroid (acquired): Secondary | ICD-10-CM | POA: Diagnosis not present

## 2021-11-23 DIAGNOSIS — I700 Atherosclerosis of aorta: Secondary | ICD-10-CM | POA: Diagnosis not present

## 2021-11-23 DIAGNOSIS — M5136 Other intervertebral disc degeneration, lumbar region: Secondary | ICD-10-CM | POA: Diagnosis not present

## 2021-11-23 DIAGNOSIS — C7802 Secondary malignant neoplasm of left lung: Secondary | ICD-10-CM | POA: Diagnosis not present

## 2021-11-23 DIAGNOSIS — K219 Gastro-esophageal reflux disease without esophagitis: Secondary | ICD-10-CM | POA: Diagnosis not present

## 2021-11-30 DIAGNOSIS — Z23 Encounter for immunization: Secondary | ICD-10-CM | POA: Diagnosis not present

## 2021-11-30 DIAGNOSIS — C7802 Secondary malignant neoplasm of left lung: Secondary | ICD-10-CM | POA: Diagnosis not present

## 2021-11-30 DIAGNOSIS — I10 Essential (primary) hypertension: Secondary | ICD-10-CM | POA: Diagnosis not present

## 2021-11-30 DIAGNOSIS — I700 Atherosclerosis of aorta: Secondary | ICD-10-CM | POA: Diagnosis not present

## 2021-11-30 DIAGNOSIS — D649 Anemia, unspecified: Secondary | ICD-10-CM | POA: Diagnosis not present

## 2021-11-30 DIAGNOSIS — M5136 Other intervertebral disc degeneration, lumbar region: Secondary | ICD-10-CM | POA: Diagnosis not present

## 2021-11-30 DIAGNOSIS — C50919 Malignant neoplasm of unspecified site of unspecified female breast: Secondary | ICD-10-CM | POA: Diagnosis not present

## 2021-11-30 DIAGNOSIS — Z0000 Encounter for general adult medical examination without abnormal findings: Secondary | ICD-10-CM | POA: Diagnosis not present

## 2021-11-30 DIAGNOSIS — K219 Gastro-esophageal reflux disease without esophagitis: Secondary | ICD-10-CM | POA: Diagnosis not present

## 2021-11-30 DIAGNOSIS — E034 Atrophy of thyroid (acquired): Secondary | ICD-10-CM | POA: Diagnosis not present

## 2021-11-30 DIAGNOSIS — F3342 Major depressive disorder, recurrent, in full remission: Secondary | ICD-10-CM | POA: Diagnosis not present

## 2021-11-30 DIAGNOSIS — E785 Hyperlipidemia, unspecified: Secondary | ICD-10-CM | POA: Diagnosis not present

## 2021-11-30 DIAGNOSIS — R7303 Prediabetes: Secondary | ICD-10-CM | POA: Diagnosis not present

## 2021-12-19 DIAGNOSIS — M7989 Other specified soft tissue disorders: Secondary | ICD-10-CM | POA: Diagnosis not present

## 2021-12-19 DIAGNOSIS — M5136 Other intervertebral disc degeneration, lumbar region: Secondary | ICD-10-CM | POA: Diagnosis not present

## 2021-12-19 DIAGNOSIS — F3342 Major depressive disorder, recurrent, in full remission: Secondary | ICD-10-CM | POA: Diagnosis not present

## 2021-12-19 DIAGNOSIS — C50919 Malignant neoplasm of unspecified site of unspecified female breast: Secondary | ICD-10-CM | POA: Diagnosis not present

## 2021-12-19 DIAGNOSIS — E034 Atrophy of thyroid (acquired): Secondary | ICD-10-CM | POA: Diagnosis not present

## 2021-12-26 DIAGNOSIS — Z79899 Other long term (current) drug therapy: Secondary | ICD-10-CM | POA: Diagnosis not present

## 2021-12-26 DIAGNOSIS — M5416 Radiculopathy, lumbar region: Secondary | ICD-10-CM | POA: Diagnosis not present

## 2021-12-26 DIAGNOSIS — M5441 Lumbago with sciatica, right side: Secondary | ICD-10-CM | POA: Diagnosis not present

## 2021-12-26 DIAGNOSIS — M5136 Other intervertebral disc degeneration, lumbar region: Secondary | ICD-10-CM | POA: Diagnosis not present

## 2021-12-26 DIAGNOSIS — M5442 Lumbago with sciatica, left side: Secondary | ICD-10-CM | POA: Diagnosis not present

## 2021-12-26 DIAGNOSIS — M48062 Spinal stenosis, lumbar region with neurogenic claudication: Secondary | ICD-10-CM | POA: Diagnosis not present

## 2021-12-29 DIAGNOSIS — E034 Atrophy of thyroid (acquired): Secondary | ICD-10-CM | POA: Diagnosis not present

## 2021-12-29 DIAGNOSIS — D649 Anemia, unspecified: Secondary | ICD-10-CM | POA: Diagnosis not present

## 2022-01-02 DIAGNOSIS — M170 Bilateral primary osteoarthritis of knee: Secondary | ICD-10-CM | POA: Diagnosis not present

## 2022-01-02 DIAGNOSIS — Z7982 Long term (current) use of aspirin: Secondary | ICD-10-CM | POA: Diagnosis not present

## 2022-01-02 DIAGNOSIS — I739 Peripheral vascular disease, unspecified: Secondary | ICD-10-CM | POA: Diagnosis not present

## 2022-01-02 DIAGNOSIS — H353290 Exudative age-related macular degeneration, unspecified eye, stage unspecified: Secondary | ICD-10-CM | POA: Diagnosis not present

## 2022-01-02 DIAGNOSIS — I480 Paroxysmal atrial fibrillation: Secondary | ICD-10-CM | POA: Diagnosis not present

## 2022-01-02 DIAGNOSIS — M479 Spondylosis, unspecified: Secondary | ICD-10-CM | POA: Diagnosis not present

## 2022-01-10 ENCOUNTER — Ambulatory Visit (INDEPENDENT_AMBULATORY_CARE_PROVIDER_SITE_OTHER): Payer: PPO | Admitting: Physician Assistant

## 2022-01-10 ENCOUNTER — Encounter: Payer: Self-pay | Admitting: Physician Assistant

## 2022-01-10 VITALS — BP 147/70 | HR 90 | Ht 66.0 in | Wt 170.0 lb

## 2022-01-10 DIAGNOSIS — N3281 Overactive bladder: Secondary | ICD-10-CM | POA: Diagnosis not present

## 2022-01-10 LAB — BLADDER SCAN AMB NON-IMAGING: Scan Result: 24

## 2022-01-10 NOTE — Progress Notes (Signed)
01/10/2022 10:58 AM   Breanna Zhang February 25, 1941 [id number]  CC: Chief Complaint  Patient presents with   Over Active Bladder   HPI: Breanna Zhang is a 81 y.o. female with PMH recurrent UTI, atrophic vaginitis, OAB wet on Myrbetriq 50 mg daily, and metastatic breast cancer s/p right mastectomy and chemotherapy on letrozole who presents today for annual follow-up and PVR.   Today she reports she has been doing well from the urinary perspective over the past year.  She denies any dysuria or vulvar burning.  She is no longer using nonhormonal vaginal moisturizers.  With regard to her OAB, she states that the Myrbetriq helps, but she still has some breakthrough urgency and foot on the floor syndrome.  She attributes some of this to increased p.o. hydration at the recommendation of her PCP.  Overall, she is pleased with her current symptoms and hesitant to pursue additional pharmacotherapy or PTNS.  PVR 24 mL.  She does tend to enter the donut hole with her insurance coverage later in the year, at which point Myrbetriq is cost prohibitive at $100 per month.  She has done well with samples to get her through this period in the past.  PMH: Past Medical History:  Diagnosis Date   Anxiety    Atrial fibrillation ([place])    Breast cancer ([place])    Breast cancer, right ([place]) 1999   RT MASTECTOMY and chemo tx's.    DDD (degenerative disc disease), cervical    hands and back with arthritis   Depression    Diverticulosis    Diverticulosis    DVT of lower extremity (deep venous thrombosis) (HCC)    Dyspnea    Family history of colon cancer    Family history of lung cancer    Family history of throat cancer    GERD (gastroesophageal reflux disease)    Goiter    Headache    History of chemotherapy 2000   BREAST CA   Hypertension    Incontinence    Neuromuscular disorder ([place])    peipheral neuropathy   OSA (obstructive sleep apnea)    Osteoporosis    Personal history of chemotherapy  1999   BREAST CA   Status post chemotherapy    2000 right breast cancer   Thyroid disease    UTI (urinary tract infection)    Vertigo     Surgical History: Past Surgical History:  Procedure Laterality Date   ABDOMINAL HYSTERECTOMY     BREAST BIOPSY Left 2006   negative   BUNIONECTOMY Bilateral    Screws implanted.   CHOLECYSTECTOMY     COCHLEAR IMPLANT Right    COLONOSCOPY WITH PROPOFOL N/A 07/24/2016   Procedure: COLONOSCOPY WITH PROPOFOL;  Surgeon: Manya Silvas, MD;  [facility] END[facility]COPY;  Service: Endoscopy;  Laterality: N/A;   ESOPHAGOGASTRODUODENOSCOPY (EGD) WITH PROPOFOL N/A 04/12/2016   Procedure: ESOPHAGOGASTRODUODENOSCOPY (EGD) WITH PROPOFOL;  Surgeon: Manya Silvas, MD;  Location: [facility] ENDOSCOPY;  Service: Endoscopy;  Laterality: N/A;   EYE SURGERY Bilateral    cataract   FOOT SURGERY Bilateral    HAND SURGERY Right    KYPHOPLASTY N/A 05/20/2020   Procedure: L3 KYPHOPLASTY;  Surgeon: Hessie Knows, MD;  Location: [facility] ORS;  Service: Orthopedics;  Laterality: N/A;   MASTECTOMY Right 1999   BREAST CA   MASTECTOMY Right 1999   PARTIAL COLECTOMY     THYROIDECTOMY     TOTAL HIP ARTHROPLASTY Left 03/19/2018   Procedure: TOTAL HIP ARTHROPLASTY LEFT;  Surgeon: Hessie Knows, MD;  Location: [facility] ORS;  Service: Orthopedics;  Laterality: Left;   VIDEO ASSISTED THORACOSCOPY Left 09/10/2017   Procedure: VIDEO ASSISTED THORACOSCOPY;  Surgeon: [name], MD;  Location: [facility] ORS;  Service: Thoracic;  Laterality: Left;  with biopsies   VIDEO BRONCHOSCOPY  09/10/2017   Procedure: VIDEO BRONCHOSCOPY;  Surgeon: [name], MD;  Location: [facility] ORS;  Service: Thoracic;;    Home Medications:  Allergies as of 01/10/2022       Reactions   Imipramine Tinitus   Latex Rash   Penicillins Rash   Tape Rash        Medication List        Accurate as of January 10, 2022 10:58 AM. If you have any questions, ask your nurse or doctor.          acetaminophen 325 MG  tablet Commonly known as: TYLENOL Take 650 mg by mouth every 4 (four) hours as needed. For pain / increased temp.  May be administered orally,  per G-Tube if needed or rectally if unable to swallow (separate order).  Maximum dose for 24 hours is 3,000 mg from all sources of Acetaminophen / Tylenol   atorvastatin 40 MG tablet Commonly known as: LIPITOR Take 1 tablet (40 mg total) by mouth at bedtime.   Baclofen 5 MG Tabs Take 1 tablet by mouth 2 (two) times daily.   busPIRone 10 MG tablet Commonly known as: BUSPAR Take 1 tablet (10 mg total) by mouth 2 (two) times daily.   cholecalciferol 1000 units tablet Commonly known as: VITAMIN D Take 1,000 Units by mouth daily.   cyanocobalamin 500 MCG tablet Commonly known as: VITAMIN B12 Take 500 mcg by mouth daily.   DULoxetine 60 MG capsule Commonly known as: CYMBALTA Take 1 capsule (60 mg total) by mouth every morning.   Fluad Quadrivalent 0.5 ML injection Generic drug: influenza vaccine adjuvanted   gabapentin 300 MG capsule Commonly known as: NEURONTIN Take 300 mg by mouth 2 (two) times daily.   HYDROcodone-acetaminophen 5-325 MG tablet Commonly known as: NORCO/VICODIN Take by mouth.   letrozole 2.5 MG tablet Commonly known as: FEMARA Take 1 tablet (2.5 mg total) by mouth daily.   levothyroxine 100 MCG tablet Commonly known as: SYNTHROID Take 1 tablet by mouth daily.   Moderna COVID-19 Bival Booster 50 MCG/0.5ML injection Generic drug: COVID-19 mRNA bivalent vaccine (Moderna)   Moderna COVID-19 Vaccine 100 MCG/0.5ML injection Generic drug: COVID-19 mRNA vaccine (Moderna)   Myrbetriq 50 MG Tb24 tablet Generic drug: mirabegron ER TAKE 1 TABLET BY MOUTH EVERY DAY   ondansetron 4 MG tablet Commonly known as: ZOFRAN Take 1 tablet (4 mg total) by mouth every 6 (six) hours as needed for nausea or vomiting.   pantoprazole 40 MG tablet Commonly known as: PROTONIX Take 1 tablet (40 mg total) by mouth 2 (two) times  daily.   traZODone 100 MG tablet Commonly known as: DESYREL TAKE 1 TABLET BY MOUTH EVERYDAY AT BEDTIME   triamcinolone cream 0.1 % Commonly known as: KENALOG SMARTSIG:1 Application Topical 2-3 Times Daily        Allergies:  Allergies  Allergen Reactions   Imipramine Tinitus   Latex Rash   Penicillins Rash   Tape Rash    Family History: Family History  Problem Relation Age of Onset   Dementia Mother    Dementia Sister    Diabetes Sister    Heart attack Brother 52   COPD Brother    Lung cancer Brother  hx smoking   Colon cancer Brother    Liver cancer Brother    COPD Brother    Lung cancer Brother        hx smoking   Colon cancer Maternal Grandfather        dx >.50   Breast cancer Neg Hx     Social History:   reports that she has never smoked. She has never used smokeless tobacco. She reports that she does not drink alcohol and does not use drugs.  Physical Exam: BP (!) 147/70   Pulse 90   Ht 5\' 6"  (1.676 m)   Wt 170 lb (77.1 kg)   BMI 27.44 kg/m   Constitutional:  Alert and oriented, no acute distress, nontoxic appearing HEENT: [place], AT Cardiovascular: No clubbing, cyanosis, or edema Respiratory: Normal respiratory effort, no increased work of breathing Skin: No rashes, bruises or suspicious lesions Neurologic: Grossly intact, no focal deficits, moving all 4 extremities Psychiatric: Normal mood and affect  Laboratory Data: Results for orders placed or performed in visit on 01/10/22  Bladder Scan (Post Void Residual) in office  Result Value Ref Range   Scan Result 24    Assessment & Plan:   1. OAB (overactive bladder) Symptoms are well managed and she is emptying appropriately on Myrbetriq 50 mg daily, will plan to continue this.  May consider a trial of Gemtesa or PTNS in the future if her symptoms worsen or she has increased bother.  For now, I gave her 2 months of Myrbetriq samples to help get her through the end of the year. - Bladder Scan  (Post Void Residual) in office  Return in about 1 year (around 01/11/2023) for Annual OAB f/u with PVR.  Debroah Loop, PA-C  [facility] [address], [place],  [address] [phone]

## 2022-02-09 DIAGNOSIS — I10 Essential (primary) hypertension: Secondary | ICD-10-CM | POA: Diagnosis not present

## 2022-02-09 DIAGNOSIS — R7303 Prediabetes: Secondary | ICD-10-CM | POA: Diagnosis not present

## 2022-02-09 DIAGNOSIS — J069 Acute upper respiratory infection, unspecified: Secondary | ICD-10-CM | POA: Diagnosis not present

## 2022-02-15 ENCOUNTER — Ambulatory Visit: Payer: PPO | Admitting: Psychiatry

## 2022-02-15 DIAGNOSIS — H353221 Exudative age-related macular degeneration, left eye, with active choroidal neovascularization: Secondary | ICD-10-CM | POA: Diagnosis not present

## 2022-02-15 DIAGNOSIS — H353211 Exudative age-related macular degeneration, right eye, with active choroidal neovascularization: Secondary | ICD-10-CM | POA: Diagnosis not present

## 2022-02-20 ENCOUNTER — Other Ambulatory Visit: Payer: Self-pay | Admitting: *Deleted

## 2022-02-20 MED ORDER — ONDANSETRON HCL 4 MG PO TABS: 4.0000 mg | ORAL_TABLET | Freq: Four times a day (QID) | ORAL | 1 refills | Status: DC | PRN

## 2022-02-20 NOTE — Addendum Note (Signed)
Addended by: Betti Cruz on: 02/20/2022 10:36 AM   Modules accepted: Orders

## 2022-03-16 ENCOUNTER — Telehealth: Payer: Self-pay | Admitting: Physician Assistant

## 2022-03-16 DIAGNOSIS — N390 Urinary tract infection, site not specified: Secondary | ICD-10-CM

## 2022-03-16 DIAGNOSIS — N3281 Overactive bladder: Secondary | ICD-10-CM

## 2022-03-16 NOTE — Telephone Encounter (Signed)
Patient called to request refill for Myrbetriq 50mg . She also wanted to thank you (Sam) for helping her by giving her samples at the end of the year.

## 2022-03-17 MED ORDER — MIRABEGRON ER 50 MG PO TB24
50.0000 mg | ORAL_TABLET | Freq: Every day | ORAL | 11 refills | Status: DC
Start: 2022-03-17 — End: 2022-03-17

## 2022-03-17 MED ORDER — MIRABEGRON ER 50 MG PO TB24: 50.0000 mg | ORAL_TABLET | Freq: Every day | ORAL | 11 refills | Status: DC

## 2022-03-17 NOTE — Telephone Encounter (Signed)
Sent to [facility]

## 2022-03-17 NOTE — Telephone Encounter (Signed)
Spoke with patient and advised results Pt uses [place] instead of [facility], rerouted to [facility]

## 2022-03-28 DIAGNOSIS — I10 Essential (primary) hypertension: Secondary | ICD-10-CM | POA: Diagnosis not present

## 2022-03-28 DIAGNOSIS — J40 Bronchitis, not specified as acute or chronic: Secondary | ICD-10-CM | POA: Diagnosis not present

## 2022-04-05 ENCOUNTER — Ambulatory Visit: Payer: PPO

## 2022-04-05 DIAGNOSIS — I700 Atherosclerosis of aorta: Secondary | ICD-10-CM | POA: Diagnosis not present

## 2022-04-05 DIAGNOSIS — C3492 Malignant neoplasm of unspecified part of left bronchus or lung: Secondary | ICD-10-CM | POA: Diagnosis not present

## 2022-04-05 DIAGNOSIS — J329 Chronic sinusitis, unspecified: Secondary | ICD-10-CM | POA: Diagnosis not present

## 2022-04-05 DIAGNOSIS — C50919 Malignant neoplasm of unspecified site of unspecified female breast: Secondary | ICD-10-CM | POA: Diagnosis not present

## 2022-04-05 DIAGNOSIS — Q791 Other congenital malformations of diaphragm: Secondary | ICD-10-CM | POA: Diagnosis not present

## 2022-04-05 DIAGNOSIS — F3342 Major depressive disorder, recurrent, in full remission: Secondary | ICD-10-CM | POA: Diagnosis not present

## 2022-04-05 DIAGNOSIS — C7802 Secondary malignant neoplasm of left lung: Secondary | ICD-10-CM | POA: Diagnosis not present

## 2022-04-10 ENCOUNTER — Telehealth: Payer: Self-pay | Admitting: Oncology

## 2022-04-10 ENCOUNTER — Other Ambulatory Visit: Payer: PPO

## 2022-04-10 ENCOUNTER — Ambulatory Visit: Payer: PPO | Admitting: Oncology

## 2022-04-10 ENCOUNTER — Encounter: Payer: Self-pay | Admitting: Internal Medicine

## 2022-04-10 NOTE — Telephone Encounter (Signed)
Attempt made to return patient's call (needs to r/s her cancelled appointments). Phone rang for 2 min+, no options to leave VM.

## 2022-04-12 DIAGNOSIS — M5416 Radiculopathy, lumbar region: Secondary | ICD-10-CM | POA: Diagnosis not present

## 2022-04-12 DIAGNOSIS — M48062 Spinal stenosis, lumbar region with neurogenic claudication: Secondary | ICD-10-CM | POA: Diagnosis not present

## 2022-04-12 DIAGNOSIS — M5136 Other intervertebral disc degeneration, lumbar region: Secondary | ICD-10-CM | POA: Diagnosis not present

## 2022-04-19 ENCOUNTER — Ambulatory Visit
Admission: RE | Admit: 2022-04-19 | Discharge: 2022-04-19 | Disposition: A | Discharge status: Home / Self Care | Payer: PPO | Source: Ambulatory Visit | Attending: Oncology | Admitting: Oncology

## 2022-04-19 DIAGNOSIS — R911 Solitary pulmonary nodule: Secondary | ICD-10-CM | POA: Diagnosis not present

## 2022-04-19 DIAGNOSIS — C50919 Malignant neoplasm of unspecified site of unspecified female breast: Secondary | ICD-10-CM | POA: Insufficient documentation

## 2022-04-19 DIAGNOSIS — Z9011 Acquired absence of right breast and nipple: Secondary | ICD-10-CM | POA: Diagnosis not present

## 2022-04-19 DIAGNOSIS — I3139 Other pericardial effusion (noninflammatory): Secondary | ICD-10-CM | POA: Diagnosis not present

## 2022-04-19 DIAGNOSIS — K1379 Other lesions of oral mucosa: Secondary | ICD-10-CM | POA: Diagnosis not present

## 2022-04-19 DIAGNOSIS — Z9221 Personal history of antineoplastic chemotherapy: Secondary | ICD-10-CM | POA: Insufficient documentation

## 2022-04-19 DIAGNOSIS — Z66 Do not resuscitate: Secondary | ICD-10-CM | POA: Diagnosis not present

## 2022-04-19 DIAGNOSIS — I10 Essential (primary) hypertension: Secondary | ICD-10-CM | POA: Diagnosis not present

## 2022-04-19 DIAGNOSIS — F3342 Major depressive disorder, recurrent, in full remission: Secondary | ICD-10-CM | POA: Diagnosis not present

## 2022-04-19 DIAGNOSIS — K6389 Other specified diseases of intestine: Secondary | ICD-10-CM | POA: Diagnosis not present

## 2022-04-19 DIAGNOSIS — R1319 Other dysphagia: Secondary | ICD-10-CM | POA: Diagnosis not present

## 2022-04-19 DIAGNOSIS — C7802 Secondary malignant neoplasm of left lung: Secondary | ICD-10-CM | POA: Diagnosis not present

## 2022-04-19 DIAGNOSIS — J90 Pleural effusion, not elsewhere classified: Secondary | ICD-10-CM | POA: Insufficient documentation

## 2022-04-19 DIAGNOSIS — K449 Diaphragmatic hernia without obstruction or gangrene: Secondary | ICD-10-CM | POA: Diagnosis not present

## 2022-04-19 DIAGNOSIS — I700 Atherosclerosis of aorta: Secondary | ICD-10-CM | POA: Diagnosis not present

## 2022-04-19 MED ORDER — IOHEXOL 300 MG/ML  SOLN
100.0000 mL | Freq: Once | INTRAMUSCULAR | Status: AC | PRN
  Administered 2022-04-19: 100 mL via INTRAVENOUS

## 2022-04-21 ENCOUNTER — Inpatient Hospital Stay: Payer: PPO

## 2022-04-21 ENCOUNTER — Encounter: Payer: Self-pay | Admitting: Oncology

## 2022-04-21 ENCOUNTER — Inpatient Hospital Stay: Payer: PPO | Attending: Oncology | Admitting: Oncology

## 2022-04-21 VITALS — BP 147/77 | HR 62 | Temp 97.7°F | Resp 18 | Wt 168.3 lb

## 2022-04-21 DIAGNOSIS — Z79811 Long term (current) use of aromatase inhibitors: Secondary | ICD-10-CM | POA: Insufficient documentation

## 2022-04-21 DIAGNOSIS — Z170 Estrogen receptor positive status [ER+]: Secondary | ICD-10-CM | POA: Diagnosis not present

## 2022-04-21 DIAGNOSIS — Z1501 Genetic susceptibility to malignant neoplasm of breast: Secondary | ICD-10-CM | POA: Diagnosis not present

## 2022-04-21 DIAGNOSIS — M8580 Other specified disorders of bone density and structure, unspecified site: Secondary | ICD-10-CM | POA: Diagnosis not present

## 2022-04-21 DIAGNOSIS — Z1509 Genetic susceptibility to other malignant neoplasm: Secondary | ICD-10-CM | POA: Insufficient documentation

## 2022-04-21 DIAGNOSIS — C50911 Malignant neoplasm of unspecified site of right female breast: Secondary | ICD-10-CM | POA: Insufficient documentation

## 2022-04-21 DIAGNOSIS — M8588 Other specified disorders of bone density and structure, other site: Secondary | ICD-10-CM

## 2022-04-21 DIAGNOSIS — C782 Secondary malignant neoplasm of pleura: Secondary | ICD-10-CM | POA: Insufficient documentation

## 2022-04-21 DIAGNOSIS — C50919 Malignant neoplasm of unspecified site of unspecified female breast: Secondary | ICD-10-CM | POA: Diagnosis not present

## 2022-04-21 LAB — CBC WITH DIFFERENTIAL/PLATELET
Abs Immature Granulocytes: 0.01 10*3/uL (ref 0.00–0.07)
Basophils Absolute: 0.1 10*3/uL (ref 0.0–0.1)
Basophils Relative: 1 %
Eosinophils Absolute: 0.1 10*3/uL (ref 0.0–0.5)
Eosinophils Relative: 2 %
HCT: 36.9 % (ref 36.0–46.0)
Hemoglobin: 11.9 g/dL — ABNORMAL LOW (ref 12.0–15.0)
Immature Granulocytes: 0 %
Lymphocytes Relative: 26 %
Lymphs Abs: 1.4 10*3/uL (ref 0.7–4.0)
MCH: 29.2 pg (ref 26.0–34.0)
MCHC: 32.2 g/dL (ref 30.0–36.0)
MCV: 90.7 fL (ref 80.0–100.0)
Monocytes Absolute: 0.6 10*3/uL (ref 0.1–1.0)
Monocytes Relative: 10 %
Neutro Abs: 3.4 10*3/uL (ref 1.7–7.7)
Neutrophils Relative %: 61 %
Platelets: 181 10*3/uL (ref 150–400)
RBC: 4.07 MIL/uL (ref 3.87–5.11)
RDW: 13.2 % (ref 11.5–15.5)
WBC: 5.5 10*3/uL (ref 4.0–10.5)
nRBC: 0 % (ref 0.0–0.2)

## 2022-04-21 LAB — COMPREHENSIVE METABOLIC PANEL
ALT: 16 U/L (ref 0–44)
AST: 25 U/L (ref 15–41)
Albumin: 3.5 g/dL (ref 3.5–5.0)
Alkaline Phosphatase: 54 U/L (ref 38–126)
Anion gap: 11 (ref 5–15)
BUN: 10 mg/dL (ref 8–23)
CO2: 24 mmol/L (ref 22–32)
Calcium: 8.4 mg/dL — ABNORMAL LOW (ref 8.9–10.3)
Chloride: 101 mmol/L (ref 98–111)
Creatinine, Ser: 0.82 mg/dL (ref 0.44–1.00)
GFR, Estimated: >60 mL/min (ref >60)
Glucose, Bld: 123 mg/dL — ABNORMAL HIGH (ref 70–99)
Potassium: 3.7 mmol/L (ref 3.5–5.1)
Sodium: 136 mmol/L (ref 135–145)
Total Bilirubin: 0.7 mg/dL (ref 0.3–1.2)
Total Protein: 6.6 g/dL (ref 6.5–8.1)

## 2022-04-21 NOTE — Progress Notes (Signed)
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  She underwent mastectomy followed by adjuvant chemotherapy and 5 years of tamoxifen.  Patient has been having some low back pain and underwent a CT lumbar spine without contrast on 08/10/2017 which was done by the pain clinic.  CT mainly showed age-related degenerative disease but also showed incidental nodular thickening of the posterior left diaphragm concerning for tumor and a CT chest was recommended.   CT chest on 08/14/2017 showed scattered left pleural nodularity with tiny left pleural effusion and prominent left internal mammary and juxtadiaphragmatic lymph nodes which are nonspecific but metastatic disease could not be ruled out.   This was followed by a PET/CT scan on 08/22/2017 which showed numerous small left-sided pleural nodules which were mildly hypermetabolic along with hypermetabolic mediastinal lymph nodes concerning for metastatic disease.  Small metastatic nodule in the left upper quadrant partly in the retrocrural space.  No findings of pulmonary metastatic or osseous metastatic disease.    Patient underwent FNA of retrocrural LN which was positive for carcinoma but primary could not be ascertained. She then underwent  pleural biopsy which showed metastatic carcinoma consistent with breast primary. ER+ HER-2.  Patient started taking Ibrance in August 2019.  Ibrance on hold since January 2021 with stable disease on letrozole   NGS testing showed PIKCA and BRCA mutation   Interval history-she is doing well for her age.  She has chronic back pain as well as hip pain which has remained overall stable.  ECOG PS- 2 Pain scale-0   Review of systems- Review of Systems  Constitutional:  Positive for malaise/fatigue. Negative for chills, fever and weight loss.  HENT:  Negative for congestion, ear discharge and nosebleeds.   Eyes:  Negative for blurred vision.  Respiratory:  Negative for cough, hemoptysis, sputum production, shortness of breath and wheezing.   Cardiovascular:  Negative for chest pain, palpitations, orthopnea and claudication.  Gastrointestinal:  Negative for abdominal pain, blood in stool, constipation, diarrhea, heartburn, melena, nausea and vomiting.  Genitourinary:  Negative for dysuria, flank pain, frequency, hematuria and urgency.  Musculoskeletal:  Positive for joint pain. Negative for back pain and myalgias.  Skin:  Negative for rash.  Neurological:  Negative for dizziness, tingling, focal weakness, seizures, weakness and headaches.  Endo/Heme/Allergies:  Does not bruise/bleed easily.  Psychiatric/Behavioral:  Negative for depression and suicidal ideas. The patient does not have insomnia.       Allergies  Allergen Reactions   Imipramine Tinitus   Latex Rash   Penicillins Rash   Tape Rash     Past Medical History:  Diagnosis Date   Anxiety    Atrial fibrillation (HCC)    Breast cancer (HCC)    Breast cancer, right ([place]) 1999   RT MASTECTOMY and chemo tx's.    DDD (degenerative disc disease),  cervical    hands and back with arthritis   Depression    Diverticulosis    Diverticulosis    DVT of lower extremity (deep venous thrombosis) (HCC)    Dyspnea    Family history of  colon cancer    Family history of lung cancer    Family history of throat cancer    GERD (gastroesophageal reflux disease)    Goiter    Headache    History of chemotherapy 2000   BREAST CA   Hypertension    Incontinence    Neuromuscular disorder ([place])    peipheral neuropathy   OSA (obstructive sleep apnea)    Osteoporosis    Personal history of chemotherapy 1999   BREAST CA   Status post chemotherapy    2000 right breast cancer   Thyroid disease    UTI (urinary tract infection)    Vertigo      Past Surgical History:  Procedure Laterality Date   ABDOMINAL HYSTERECTOMY     BREAST BIOPSY Left 2006   negative   BUNIONECTOMY Bilateral    Screws implanted.   CHOLECYSTECTOMY     COCHLEAR IMPLANT Right    COLONOSCOPY WITH PROPOFOL N/A 07/24/2016   Procedure: COLONOSCOPY WITH PROPOFOL;  Surgeon: Manya Silvas, MD;  Location: [facility] ENDOSCOPY;  Service: Endoscopy;  Laterality: N/A;   ESOPHAGOGASTRODUODENOSCOPY (EGD) WITH PROPOFOL N/A 04/12/2016   Procedure: ESOPHAGOGASTRODUODENOSCOPY (EGD) WITH PROPOFOL;  Surgeon: Manya Silvas, MD;  Location: [facility] ENDOSCOPY;  Service: Endoscopy;  Laterality: N/A;   EYE SURGERY Bilateral    cataract   FOOT SURGERY Bilateral    HAND SURGERY Right    KYPHOPLASTY N/A 05/20/2020   Procedure: L3 KYPHOPLASTY;  Surgeon: Hessie Knows, MD;  Location: [facility] ORS;  Service: Orthopedics;  Laterality: N/A;   MASTECTOMY Right 1999   BREAST CA   MASTECTOMY Right 1999   PARTIAL COLECTOMY     THYROIDECTOMY     TOTAL HIP ARTHROPLASTY Left 03/19/2018   Procedure: TOTAL HIP ARTHROPLASTY LEFT;  Surgeon: Hessie Knows, MD;  Location: [facility] ORS;  Service: Orthopedics;  Laterality: Left;   VIDEO ASSISTED THORACOSCOPY Left 09/10/2017   Procedure: VIDEO ASSISTED THORACOSCOPY;  Surgeon: [name], MD;  Location: [facility] ORS;  Service: Thoracic;  Laterality: Left;  with biopsies   VIDEO BRONCHOSCOPY  09/10/2017   Procedure: VIDEO BRONCHOSCOPY;  Surgeon: [name], MD;   Location: [facility] ORS;  Service: Thoracic;;    Social History   Socioeconomic History   Marital status: Married    Spouse name: evert   Number of children: 4   Years of education: Not on file   Highest education level: 9th grade  Occupational History   Not on file  Tobacco Use   Smoking status: Never   Smokeless tobacco: Never  Vaping Use   Vaping Use: Never used  Substance and Sexual Activity   Alcohol use: No    Alcohol/week: 0.0 standard drinks of alcohol   Drug use: No   Sexual activity: Not Currently  Other Topics Concern   Not on file  Social History Narrative   Not on file   Social Determinants of Health   Financial Resource Strain: Medium Risk (09/24/2017)   Overall Financial Resource Strain (CARDIA)    Difficulty of Paying Living Expenses: Somewhat hard  Food Insecurity: Food Insecurity Present (09/24/2017)   Hunger Vital Sign    Worried About Running Out of Food in the Last Year: Often true    Ran Out of Food in  the Last Year: Often true  Transportation Needs: No Transportation Needs (09/24/2017)   PRAPARE - Hydrologist (Medical): No    Lack of Transportation (Non-Medical): No  Physical Activity: Inactive (09/24/2017)   Exercise Vital Sign    Days of Exercise per Week: 0 days    Minutes of Exercise per Session: 0 min  Stress: No Stress Concern Present (09/24/2017)   [place]    Feeling of Stress : Not at all  Social Connections: Unknown (09/24/2017)   Social Connection and Isolation Panel [NHANES]    Frequency of Communication with Friends and Family: Not on file    Frequency of Social Gatherings with Friends and Family: Not on file    Attends Religious Services: More than 4 times per year    Active Member of [employer] or Organizations: No    Attends Archivist Meetings: Never    Marital Status: Married  Human resources officer Violence: Not At Risk (09/24/2017)    Humiliation, Afraid, Rape, and Kick questionnaire    Fear of Current or Ex-Partner: No    Emotionally Abused: No    Physically Abused: No    Sexually Abused: No    Family History  Problem Relation Age of Onset   Dementia Mother    Dementia Sister    Diabetes Sister    Heart attack Brother 74   COPD Brother    Lung cancer Brother        hx smoking   Colon cancer Brother    Liver cancer Brother    COPD Brother    Lung cancer Brother        hx smoking   Colon cancer Maternal Grandfather        dx >.50   Breast cancer Neg Hx      Current Outpatient Medications:    acetaminophen (TYLENOL) 325 MG tablet, Take 650 mg by mouth every 4 (four) hours as needed. For pain / increased temp.  May be administered orally,  per G-Tube if needed or rectally if unable to swallow (separate order).  Maximum dose for 24 hours is 3,000 mg from all sources of Acetaminophen / Tylenol, Disp: , Rfl:    atorvastatin (LIPITOR) 40 MG tablet, Take 1 tablet (40 mg total) by mouth at bedtime., Disp: 30 tablet, Rfl: 0   Baclofen 5 MG TABS, Take 1 tablet by mouth 2 (two) times daily., Disp: , Rfl:    busPIRone (BUSPAR) 10 MG tablet, Take 1 tablet (10 mg total) by mouth 2 (two) times daily., Disp: 180 tablet, Rfl: 1   cholecalciferol (VITAMIN D) 1000 units tablet, Take 1,000 Units by mouth daily., Disp: , Rfl:    DULoxetine (CYMBALTA) 60 MG capsule, Take 1 capsule (60 mg total) by mouth every morning., Disp: 90 capsule, Rfl: 1   FLUAD QUADRIVALENT 0.5 ML injection, , Disp: , Rfl:    gabapentin (NEURONTIN) 300 MG capsule, Take 300 mg by mouth 2 (two) times daily., Disp: , Rfl:    HYDROcodone-acetaminophen (NORCO/VICODIN) 5-325 MG tablet, Take by mouth., Disp: , Rfl:    letrozole (FEMARA) 2.5 MG tablet, Take 1 tablet (2.5 mg total) by mouth daily., Disp: 90 tablet, Rfl: 1   mirabegron ER (MYRBETRIQ) 50 MG TB24 tablet, Take 1 tablet (50 mg total) by mouth daily., Disp: 30 tablet, Rfl: 11   MODERNA COVID-19 BIVAL  BOOSTER 50 MCG/0.5ML injection, , Disp: , Rfl:    MODERNA COVID-19 VACCINE  100 MCG/0.5ML injection, , Disp: , Rfl:    ondansetron (ZOFRAN) 4 MG tablet, Take 1 tablet (4 mg total) by mouth every 6 (six) hours as needed for nausea or vomiting., Disp: 30 tablet, Rfl: 1   pantoprazole (PROTONIX) 40 MG tablet, Take 1 tablet (40 mg total) by mouth 2 (two) times daily., Disp: 60 tablet, Rfl: 0   traZODone (DESYREL) 100 MG tablet, TAKE 1 TABLET BY MOUTH EVERYDAY AT BEDTIME, Disp: 90 tablet, Rfl: 1   triamcinolone cream (KENALOG) 0.1 %, SMARTSIG:1 Application Topical 2-3 Times Daily, Disp: , Rfl:    vitamin B-12 (CYANOCOBALAMIN) 500 MCG tablet, Take 500 mcg by mouth daily. , Disp: , Rfl:    levothyroxine (SYNTHROID) 100 MCG tablet, Take 1 tablet by mouth daily., Disp: , Rfl:   Physical exam:  Vitals:   04/21/22 0959  BP: (!) 147/77  Pulse: 62  Resp: 18  Temp: 97.7 F (36.5 C)  TempSrc: Tympanic  SpO2: 92%  Weight: 168 lb 4.8 oz (76.3 kg)   Physical Exam Cardiovascular:     Rate and Rhythm: Normal rate and regular rhythm.     Heart sounds: Normal heart sounds.  Pulmonary:     Effort: Pulmonary effort is normal.     Breath sounds: Normal breath sounds.  Abdominal:     General: Bowel sounds are normal.     Palpations: Abdomen is soft.  Skin:    General: Skin is warm and dry.  Neurological:     Mental Status: She is alert and oriented to person, place, and time.         Latest Ref Rng & Units 04/21/2022    9:29 AM  CMP  Glucose 70 - 99 mg/dL 123   BUN 8 - 23 mg/dL 10   Creatinine 0.44 - 1.00 mg/dL 0.82   Sodium 135 - 145 mmol/L 136   Potassium 3.5 - 5.1 mmol/L 3.7   Chloride 98 - 111 mmol/L 101   CO2 22 - 32 mmol/L 24   Calcium 8.9 - 10.3 mg/dL 8.4   Total Protein 6.5 - 8.1 g/dL 6.6   Total Bilirubin 0.3 - 1.2 mg/dL 0.7   Alkaline Phos 38 - 126 U/L 54   AST 15 - 41 U/L 25   ALT 0 - 44 U/L 16       Latest Ref Rng & Units 04/21/2022    9:29 AM  CBC  WBC 4.0 - 10.5 K/uL 5.5    Hemoglobin 12.0 - 15.0 g/dL 11.9   Hematocrit 36.0 - 46.0 % 36.9   Platelets 150 - 400 K/uL 181     No images are attached to the encounter.  CT CHEST ABDOMEN PELVIS W CONTRAST  Result Date: 04/19/2022 CLINICAL DATA:  Metastatic breast cancer restaging * Tracking Code: BO * EXAM: CT CHEST, ABDOMEN, AND PELVIS WITH CONTRAST TECHNIQUE: Multidetector CT imaging of the chest, abdomen and pelvis was performed following the standard protocol during bolus administration of intravenous contrast. RADIATION DOSE REDUCTION: This exam was performed according to the departmental dose-optimization program which includes automated exposure control, adjustment of the mA and/or kV according to patient size and/or use of iterative reconstruction technique. CONTRAST:  166mL OMNIPAQUE IOHEXOL 300 MG/ML  SOLN COMPARISON:  09/29/2021 FINDINGS: CT CHEST FINDINGS Cardiovascular: Aortic atherosclerosis. Normal heart size. Unchanged trace pericardial effusion. Mediastinum/Nodes: No enlarged mediastinal, hilar, or axillary lymph nodes. Moderate hiatal hernia with intrathoracic position of the gastric fundus. Thyroid gland, trachea, and esophagus demonstrate no significant findings. Lungs/Pleura: Mild  dependent bibasilar scarring and or atelectasis. Unchanged, benign 0.4 cm fissural nodule of the left lower lobe (series 3, image 76). No pleural effusion or pneumothorax. Musculoskeletal: Status post right mastectomy. No acute osseous findings. CT ABDOMEN PELVIS FINDINGS Hepatobiliary: No focal liver abnormality is seen. Status post cholecystectomy. Mild postoperative biliary dilatation. Pancreas: Unremarkable. No pancreatic ductal dilatation or surrounding inflammatory changes. Spleen: Normal in size without significant abnormality. Adrenals/Urinary Tract: Adrenal glands are unremarkable. Kidneys are normal, without renal calculi, solid lesion, or hydronephrosis. Bladder is unremarkable. High attenuation material in the vicinity of  the urethra consistent with urethral filler injection. Stomach/Bowel: Stomach is within normal limits. Status post partial right hemicolectomy and ileocolic anastomosis. No evidence of bowel wall thickening, distention, or inflammatory changes. Moderate burden of stool throughout the colon. Vascular/Lymphatic: Aortic atherosclerosis. No enlarged abdominal or pelvic lymph nodes. Reproductive: Status post hysterectomy. Other: No abdominal wall hernia or abnormality. No ascites. Musculoskeletal: No acute osseous findings. Status post left hip total arthroplasty. Status post vertebral cement augmentation of L3. IMPRESSION: 1. No evidence of recurrent or metastatic disease in the chest, abdomen, or pelvis. 2. Status post right mastectomy. 3. Status post partial right hemicolectomy and ileocolic anastomosis. 4. Moderate hiatal hernia with intrathoracic position of the gastric fundus. Aortic Atherosclerosis (ICD10-I70.0). Electronically Signed   By: Delanna Ahmadi M.D.   On: 04/19/2022 10:48     Assessment and plan- Patient is a 82 y.o. female with metastatic ER positive breast cancer and pleural nodules here for routine follow-up  Patient is tolerating letrozoleWithout any significant side effects.  She had a bone density scan in August 2023 which did show worsening osteopenia with a T-score of -2 with AP spine.  10-year probability of a major osteoporotic fracture is 18.2% and hip fracture at 3.7% and her scores were worse than those in 2019.  She would qualify for bisphosphonates and we discussed weekly Fosamax versus Reclast once a year.  She is edentulous and therefore the risk of osteonecrosis of the jaw was quite low.  She is agreeable to proceeding with Reclast once a year.  We will have her come for her first injection in the next 1 to 2 weeks.  I will see her back in 6 months with CBC with her CMP CA 27-29 and CA 15-3.  I have reviewed CT chest abdomen pelvis images independently which overall does not show  any evidence of active disease.  Previously noted pleural nodules are no longer visible.  However I would like her to continue aromatase inhibitor since we had biopsy-proven metastatic disease in the past   Visit Diagnosis 1. Malignant neoplasm of female breast, unspecified estrogen receptor status, unspecified laterality, unspecified site of breast ([place])   2. Use of letrozole (Femara)   3. Osteopenia of lumbar spine      Dr. Randa Evens, MD, MPH [facility] [phone] 04/21/2022 12:55 PM

## 2022-04-21 NOTE — Progress Notes (Signed)
Patient here today for follow up regarding metastatic breast cancer. Patient report she is tolerating letrozole well overall. Patient does report difficulty swallowing, PCP has referred to GI for EGD and work up. Patient reports she has nausea at times. Patient continues to have shortness of breath, O2 sats 92% on room air.

## 2022-04-23 LAB — CANCER ANTIGEN 15-3: CA 15-3: 25.5 U/mL — ABNORMAL HIGH (ref 0.0–25.0)

## 2022-04-26 DIAGNOSIS — K219 Gastro-esophageal reflux disease without esophagitis: Secondary | ICD-10-CM | POA: Diagnosis not present

## 2022-04-26 DIAGNOSIS — R1310 Dysphagia, unspecified: Secondary | ICD-10-CM | POA: Diagnosis not present

## 2022-04-26 DIAGNOSIS — Z86010 Personal history of colonic polyps: Secondary | ICD-10-CM | POA: Diagnosis not present

## 2022-04-28 ENCOUNTER — Inpatient Hospital Stay: Payer: PPO

## 2022-04-28 DIAGNOSIS — C50919 Malignant neoplasm of unspecified site of unspecified female breast: Secondary | ICD-10-CM

## 2022-04-28 DIAGNOSIS — C782 Secondary malignant neoplasm of pleura: Secondary | ICD-10-CM | POA: Diagnosis not present

## 2022-04-28 LAB — COMPREHENSIVE METABOLIC PANEL
ALT: 13 U/L (ref 0–44)
AST: 23 U/L (ref 15–41)
Albumin: 3.6 g/dL (ref 3.5–5.0)
Alkaline Phosphatase: 62 U/L (ref 38–126)
Anion gap: 9 (ref 5–15)
BUN: 13 mg/dL (ref 8–23)
CO2: 23 mmol/L (ref 22–32)
Calcium: 8.3 mg/dL — ABNORMAL LOW (ref 8.9–10.3)
Chloride: 101 mmol/L (ref 98–111)
Creatinine, Ser: 0.79 mg/dL (ref 0.44–1.00)
GFR, Estimated: >60 mL/min (ref >60)
Glucose, Bld: 110 mg/dL — ABNORMAL HIGH (ref 70–99)
Potassium: 3.7 mmol/L (ref 3.5–5.1)
Sodium: 133 mmol/L — ABNORMAL LOW (ref 135–145)
Total Bilirubin: 0.7 mg/dL (ref 0.3–1.2)
Total Protein: 6.4 g/dL — ABNORMAL LOW (ref 6.5–8.1)

## 2022-04-28 NOTE — Progress Notes (Signed)
Calcium level too low- HOLD retacrit today per Dr Janese Banks. Pt will be rescheduled next month with labs.  Pt verbalized understanding

## 2022-05-03 ENCOUNTER — Ambulatory Visit
Admission: RE | Admit: 2022-05-03 | Discharge: 2022-05-03 | Disposition: A | Discharge status: Home / Self Care | Payer: PPO | Attending: Internal Medicine | Admitting: Internal Medicine

## 2022-05-03 ENCOUNTER — Other Ambulatory Visit: Payer: Self-pay

## 2022-05-03 ENCOUNTER — Encounter: Payer: Self-pay | Admitting: Internal Medicine

## 2022-05-03 ENCOUNTER — Ambulatory Visit: Payer: PPO | Admitting: Registered Nurse

## 2022-05-03 ENCOUNTER — Encounter
Admission: RE | Disposition: A | Discharge status: Home / Self Care | Payer: Self-pay | Source: Home / Self Care | Attending: Internal Medicine

## 2022-05-03 DIAGNOSIS — F419 Anxiety disorder, unspecified: Secondary | ICD-10-CM | POA: Diagnosis not present

## 2022-05-03 DIAGNOSIS — K219 Gastro-esophageal reflux disease without esophagitis: Secondary | ICD-10-CM | POA: Insufficient documentation

## 2022-05-03 DIAGNOSIS — R1313 Dysphagia, pharyngeal phase: Secondary | ICD-10-CM | POA: Insufficient documentation

## 2022-05-03 DIAGNOSIS — Z79891 Long term (current) use of opiate analgesic: Secondary | ICD-10-CM | POA: Insufficient documentation

## 2022-05-03 DIAGNOSIS — Z79899 Other long term (current) drug therapy: Secondary | ICD-10-CM | POA: Insufficient documentation

## 2022-05-03 DIAGNOSIS — I10 Essential (primary) hypertension: Secondary | ICD-10-CM | POA: Diagnosis not present

## 2022-05-03 DIAGNOSIS — M1990 Unspecified osteoarthritis, unspecified site: Secondary | ICD-10-CM | POA: Diagnosis not present

## 2022-05-03 DIAGNOSIS — F32A Depression, unspecified: Secondary | ICD-10-CM | POA: Diagnosis not present

## 2022-05-03 DIAGNOSIS — I739 Peripheral vascular disease, unspecified: Secondary | ICD-10-CM | POA: Insufficient documentation

## 2022-05-03 DIAGNOSIS — G4733 Obstructive sleep apnea (adult) (pediatric): Secondary | ICD-10-CM | POA: Diagnosis not present

## 2022-05-03 DIAGNOSIS — K449 Diaphragmatic hernia without obstruction or gangrene: Secondary | ICD-10-CM | POA: Diagnosis not present

## 2022-05-03 DIAGNOSIS — Z800 Family history of malignant neoplasm of digestive organs: Secondary | ICD-10-CM | POA: Diagnosis not present

## 2022-05-03 DIAGNOSIS — I4891 Unspecified atrial fibrillation: Secondary | ICD-10-CM | POA: Insufficient documentation

## 2022-05-03 DIAGNOSIS — R1310 Dysphagia, unspecified: Secondary | ICD-10-CM | POA: Diagnosis not present

## 2022-05-03 DIAGNOSIS — K2970 Gastritis, unspecified, without bleeding: Secondary | ICD-10-CM | POA: Diagnosis not present

## 2022-05-03 DIAGNOSIS — E039 Hypothyroidism, unspecified: Secondary | ICD-10-CM | POA: Insufficient documentation

## 2022-05-03 HISTORY — PX: ESOPHAGOGASTRODUODENOSCOPY (EGD) WITH PROPOFOL: SHX5813

## 2022-05-03 SURGERY — ESOPHAGOGASTRODUODENOSCOPY (EGD) WITH PROPOFOL
Anesthesia: General

## 2022-05-03 MED ORDER — GLYCOPYRROLATE 0.2 MG/ML IJ SOLN
INTRAMUSCULAR | Status: DC | PRN
  Administered 2022-05-03: .1 mg via INTRAVENOUS

## 2022-05-03 MED ORDER — LIDOCAINE HCL (CARDIAC) PF 100 MG/5ML IV SOSY
PREFILLED_SYRINGE | INTRAVENOUS | Status: DC | PRN
  Administered 2022-05-03: 80 mg via INTRAVENOUS

## 2022-05-03 MED ORDER — GLYCOPYRROLATE 0.2 MG/ML IJ SOLN
INTRAMUSCULAR | Status: AC
  Filled 2022-05-03: qty 1

## 2022-05-03 MED ORDER — SODIUM CHLORIDE 0.9 % IV SOLN: INTRAVENOUS | Status: DC

## 2022-05-03 MED ORDER — PROPOFOL 10 MG/ML IV BOLUS
INTRAVENOUS | Status: DC | PRN
  Administered 2022-05-03: 30 mg via INTRAVENOUS
  Administered 2022-05-03: 80 mg via INTRAVENOUS

## 2022-05-03 NOTE — Op Note (Signed)
[facility] Gastroenterology Patient Name: Breanna Zhang Procedure Date: 05/03/2022 10:39 AM MRN: [id number] Account #: [id number] Date of Birth: January 21, 1941 Admit Type: Outpatient Age: 82 Room: [facility] ENDO ROOM 2 Gender: Female Note Status: Finalized Instrument Name: Upper Endoscope [id number] Procedure:             Upper GI endoscopy Indications:           Pharyngeal phase dysphagia, Gastro-esophageal reflux                         disease Providers:             Benay Pike. Alice Reichert MD, MD Referring MD:          Ramonita Lab, MD (Referring MD) Medicines:             Propofol per Anesthesia Complications:         No immediate complications. Procedure:             Pre-Anesthesia Assessment:                        - The risks and benefits of the procedure and the                         sedation options and risks were discussed with the                         patient. All questions were answered and informed                         consent was obtained.                        - Patient identification and proposed procedure were                         verified prior to the procedure by the nurse. The                         procedure was verified in the procedure room.                        - ASA Grade Assessment: III - A patient with severe                         systemic disease.                        - After reviewing the risks and benefits, the patient                         was deemed in satisfactory condition to undergo the                         procedure.                        After obtaining informed consent, the endoscope was                         passed under direct vision.  Throughout the procedure,                         the patient's blood pressure, pulse, and oxygen                         saturations were monitored continuously. The Endoscope                         was introduced through the mouth, and advanced to the                         third  part of duodenum. The upper GI endoscopy was                         accomplished without difficulty. The patient tolerated                         the procedure well. Findings:      Moderate tortuosity of the mid to distal esophagus was noted compatible       with a diagnosis of Presbyesophagus.      There is no endoscopic evidence of stenosis or stricture or ring in the       distal esophagus.      A 2 cm hiatal hernia was present.      Scattered minimal inflammation characterized by erythema was found in       the gastric antrum.      The examined duodenum was normal.      The exam was otherwise without abnormality. Impression:            - 2 cm hiatal hernia.                        - Gastritis.                        - Normal examined duodenum.                        - The examination was otherwise normal.                        - No specimens collected. Recommendation:        - Patient has a contact number available for                         emergencies. The signs and symptoms of potential                         delayed complications were discussed with the patient.                         Return to normal activities tomorrow. Written                         discharge instructions were provided to the patient.                        - Mechanical soft diet.                        -  Continue present medications.                        - Return to GI office PRN.                        - The findings and recommendations were discussed with                         the patient. Procedure Code(s):     --- Professional ---                        [phone], Esophagogastroduodenoscopy, flexible,                         transoral; diagnostic, including collection of                         specimen(s) by brushing or washing, when performed                         (separate procedure) Diagnosis Code(s):     --- Professional ---                        K21.9, Gastro-esophageal reflux disease  without                         esophagitis                        R13.13, Dysphagia, pharyngeal phase                        K29.70, Gastritis, unspecified, without bleeding                        K44.9, Diaphragmatic hernia without obstruction or                         gangrene CPT copyright 2022 American Medical Association. All rights reserved. The codes documented in this report are preliminary and upon coder review may  be revised to meet current compliance requirements. Efrain Sella MD, MD 05/03/2022 11:06:38 AM This report has been signed electronically. Number of Addenda: 0 Note Initiated On: 05/03/2022 10:39 AM Estimated Blood Loss:  Estimated blood loss: none.      [facility]

## 2022-05-03 NOTE — Transfer of Care (Signed)
Immediate Anesthesia Transfer of Care Note  Patient: Breanna Zhang  Procedure(s) Performed: ESOPHAGOGASTRODUODENOSCOPY (EGD) WITH PROPOFOL  Patient Location: PACU  Anesthesia Type:General  Level of Consciousness: drowsy  Airway & Oxygen Therapy: Patient Spontanous Breathing  Post-op Assessment: Report given to RN and Post -op Vital signs reviewed and stable  Post vital signs: Reviewed and stable  Last Vitals:  Vitals Value Taken Time  BP 140/82 05/03/22 1106  Temp 97.5   Pulse 92 05/03/22 1107  Resp 19 05/03/22 1107  SpO2 99 % 05/03/22 1107  Vitals shown include unvalidated device data.  Last Pain:  Vitals:   05/03/22 1106  TempSrc:   PainSc: 0-No pain         Complications: No notable events documented.

## 2022-05-03 NOTE — Interval H&P Note (Signed)
History and Physical Interval Note:  05/03/2022 10:13 AM  Breanna Zhang  has presented today for surgery, with the diagnosis of R13.10 - Dysphagia, unspecified type K21.9  - Gastroesophageal reflux disease, unspecified whether esophagitis present.  The various methods of treatment have been discussed with the patient and family. After consideration of risks, benefits and other options for treatment, the patient has consented to  Procedure(s): ESOPHAGOGASTRODUODENOSCOPY (EGD) WITH PROPOFOL (N/A) as a surgical intervention.  The patient's history has been reviewed, patient examined, no change in status, stable for surgery.  I have reviewed the patient's chart and labs.  Questions were answered to the patient's satisfaction.     [place], [place]

## 2022-05-03 NOTE — H&P (Signed)
Outpatient short stay form Pre-procedure 05/03/2022 10:11 AM Breanna Zhang K. Alice Reichert, M.D.  Primary Physician: Ramonita Lab III, M.D.  Reason for visit:  Dysphagia, GERD  History of present illness:  Breanna Zhang reports having issues with dysphagia over the past few months, she notes that food often will feel like they stop in her lower esophageal area, sometimes feels esophageal discomfort as the foods go down. Most recently occurred when eating tater tots. She has occasional dysphagia to pills and liquids but solid foods seem to be the most prominent trigger. She feels some reflux symptoms despite Protonix 40 mg twice a day. She has some occasional nausea and states this can be triggered by smells. She denies frequent vomiting. Does take zofran for the nausea at times. She does endorse some early satiety and feels she is not as hungry as used to be. She typically eats dinner at 4 PM and goes to bed at 8 PM. Drinks 3 cups coffee per day.  She reports she has a bowel movement about every other day, if she occasionally misses a few days without a bowel movement she will take miralax PRN when needed. She denies any diarrhea, rectal bleeding, lower abd pain.  Reports brother had colon cancer in 63s. Denise tobacco, alcohol, and nsaids. Has lost a few lbs recently due to not being as hungry as normal. She takes Norco 2 times per day.  2018---EGD and Colonoscopy --- long colon, small polyp transverse, diverticulosis, path w/ TA in transverse. EGD---mild Schatzki ring dilated 65mm, small HH, normal duodenum.    No current facility-administered medications for this encounter.  Medications Prior to Admission  Medication Sig Dispense Refill Last Dose   acetaminophen (TYLENOL) 325 MG tablet Take 650 mg by mouth every 4 (four) hours as needed. For pain / increased temp.  May be administered orally,  per G-Tube if needed or rectally if unable to swallow (separate order).  Maximum dose for 24 hours is 3,000 mg from  all sources of Acetaminophen / Tylenol      atorvastatin (LIPITOR) 40 MG tablet Take 1 tablet (40 mg total) by mouth at bedtime. 30 tablet 0    Baclofen 5 MG TABS Take 1 tablet by mouth 2 (two) times daily.      busPIRone (BUSPAR) 10 MG tablet Take 1 tablet (10 mg total) by mouth 2 (two) times daily. 180 tablet 1    cholecalciferol (VITAMIN D) 1000 units tablet Take 1,000 Units by mouth daily.      DULoxetine (CYMBALTA) 60 MG capsule Take 1 capsule (60 mg total) by mouth every morning. 90 capsule 1    FLUAD QUADRIVALENT 0.5 ML injection       gabapentin (NEURONTIN) 300 MG capsule Take 300 mg by mouth 2 (two) times daily.      HYDROcodone-acetaminophen (NORCO/VICODIN) 5-325 MG tablet Take by mouth.      letrozole (FEMARA) 2.5 MG tablet Take 1 tablet (2.5 mg total) by mouth daily. 90 tablet 1    levothyroxine (SYNTHROID) 100 MCG tablet Take 1 tablet by mouth daily.      mirabegron ER (MYRBETRIQ) 50 MG TB24 tablet Take 1 tablet (50 mg total) by mouth daily. 30 tablet 11    MODERNA COVID-19 BIVAL BOOSTER 50 MCG/0.5ML injection       MODERNA COVID-19 VACCINE 100 MCG/0.5ML injection       ondansetron (ZOFRAN) 4 MG tablet Take 1 tablet (4 mg total) by mouth every 6 (six) hours as needed for nausea or vomiting. [place]  tablet 1    pantoprazole (PROTONIX) 40 MG tablet Take 1 tablet (40 mg total) by mouth 2 (two) times daily. 60 tablet 0    traZODone (DESYREL) 100 MG tablet TAKE 1 TABLET BY MOUTH EVERYDAY AT BEDTIME 90 tablet 1    triamcinolone cream (KENALOG) 0.1 % SMARTSIG:1 Application Topical 2-3 Times Daily      vitamin B-12 (CYANOCOBALAMIN) 500 MCG tablet Take 500 mcg by mouth daily.         Allergies  Allergen Reactions   Imipramine Tinitus   Latex Rash   Penicillins Rash   Tape Rash     Past Medical History:  Diagnosis Date   Anxiety    Atrial fibrillation (HCC)    Breast cancer (HCC)    Breast cancer, right ([place]) 1999   RT MASTECTOMY and chemo tx's.    DDD (degenerative disc disease),  cervical    hands and back with arthritis   Depression    Diverticulosis    Diverticulosis    DVT of lower extremity (deep venous thrombosis) (HCC)    Dyspnea    Family history of colon cancer    Family history of lung cancer    Family history of throat cancer    GERD (gastroesophageal reflux disease)    Goiter    Headache    History of chemotherapy 2000   BREAST CA   Hypertension    Incontinence    Neuromuscular disorder (HCC)    peipheral neuropathy   OSA (obstructive sleep apnea)    Osteoporosis    Personal history of chemotherapy 1999   BREAST CA   Status post chemotherapy    2000 right breast cancer   Thyroid disease    UTI (urinary tract infection)    Vertigo     Review of systems:  Otherwise negative.    Physical Exam  Gen: Alert, oriented. Appears stated age.  HEENT: [place]/AT. PERRLA. Lungs: CTA, no wheezes. CV: RR nl S1, S2. Abd: soft, benign, no masses. BS+ Ext: No edema. Pulses 2+    Planned procedures: Proceed with colonoscopy. The patient understands the nature of the planned procedure, indications, risks, alternatives and potential complications including but not limited to bleeding, infection, perforation, damage to internal organs and possible oversedation/side effects from anesthesia. The patient agrees and gives consent to proceed.  Please refer to procedure notes for findings, recommendations and patient disposition/instructions.     Efren Kross K. Alice Reichert, M.D. Gastroenterology 05/03/2022  10:11 AM

## 2022-05-03 NOTE — Anesthesia Preprocedure Evaluation (Signed)
Anesthesia Evaluation  Patient identified by MRN, date of birth, ID band Patient awake    Reviewed: Allergy & Precautions, NPO status , Patient's Chart, lab work & pertinent test results, reviewed documented beta blocker date and time   History of Anesthesia Complications Negative for: history of anesthetic complications  Airway Mallampati: II  TM Distance: >3 FB     Dental  (+) Upper Dentures, Lower Dentures, Dental Advidsory Given   Pulmonary shortness of breath and with exertion, sleep apnea , neg COPD, neg recent URI          Cardiovascular hypertension, Pt. on medications (-) angina + Peripheral Vascular Disease  (-) CAD, (-) Past MI, (-) Cardiac Stents and (-) CABG + dysrhythmias Atrial Fibrillation (-) Valvular Problems/Murmurs     Neuro/Psych  Headaches, neg Seizures PSYCHIATRIC DISORDERS Anxiety Depression     Neuromuscular disease    GI/Hepatic Neg liver ROS,GERD  Controlled,,  Endo/Other  neg diabetesHypothyroidism    Renal/GU negative Renal ROS     Musculoskeletal  (+) Arthritis ,    Abdominal   Peds  Hematology   Anesthesia Other Findings Past Medical History: No date: Anxiety No date: Atrial fibrillation ([place]) 1999: Breast cancer ([place])     Comment:  RT MASTECTOMY No date: DDD (degenerative disc disease), cervical No date: Depression No date: Diverticulosis No date: Diverticulosis No date: DVT of lower extremity (deep venous thrombosis) (HCC) No date: Dyspnea No date: Family history of colon cancer No date: Family history of lung cancer No date: Family history of throat cancer No date: GERD (gastroesophageal reflux disease) No date: Goiter No date: Headache 2000: History of chemotherapy     Comment:  BREAST CA No date: Hypertension No date: Incontinence No date: OSA (obstructive sleep apnea) No date: Osteoporosis 1999: Personal history of chemotherapy     Comment:  BREAST CA No date:  Status post chemotherapy     Comment:  2000 right breast cancer No date: Thyroid disease No date: UTI (urinary tract infection) No date: Vertigo   Reproductive/Obstetrics                             Anesthesia Physical Anesthesia Plan  ASA: 3  Anesthesia Plan: General   Post-op Pain Management:    Induction: Intravenous  PONV Risk Score and Plan: 2 and Propofol infusion, TIVA and Treatment may vary due to age or medical condition  Airway Management Planned: Natural Airway and Nasal Cannula  Additional Equipment:   Intra-op Plan:   Post-operative Plan:   Informed Consent: I have reviewed the patients History and Physical, chart, labs and discussed the procedure including the risks, benefits and alternatives for the proposed anesthesia with the patient or authorized representative who has indicated his/her understanding and acceptance.       Plan Discussed with: CRNA  Anesthesia Plan Comments:         Anesthesia Quick Evaluation

## 2022-05-04 ENCOUNTER — Encounter: Payer: Self-pay | Admitting: Internal Medicine

## 2022-05-04 NOTE — Anesthesia Postprocedure Evaluation (Signed)
Anesthesia Post Note  Patient: Breanna Zhang  Procedure(s) Performed: ESOPHAGOGASTRODUODENOSCOPY (EGD) WITH PROPOFOL  Patient location during evaluation: Endoscopy Anesthesia Type: General Level of consciousness: awake and alert Pain management: pain level controlled Vital Signs Assessment: post-procedure vital signs reviewed and stable Respiratory status: spontaneous breathing, nonlabored ventilation, respiratory function stable and patient connected to nasal cannula oxygen Cardiovascular status: blood pressure returned to baseline and stable Postop Assessment: no apparent nausea or vomiting Anesthetic complications: no   No notable events documented.   Last Vitals:  Vitals:   05/03/22 1119 05/03/22 1132  BP: (!) 166/101 (!) 148/72  Pulse:    Resp:    Temp:    SpO2:      Last Pain:  Vitals:   05/04/22 0735  TempSrc:   PainSc: 0-No pain                 Martha Clan

## 2022-05-23 DIAGNOSIS — M5136 Other intervertebral disc degeneration, lumbar region: Secondary | ICD-10-CM | POA: Diagnosis not present

## 2022-05-23 DIAGNOSIS — D649 Anemia, unspecified: Secondary | ICD-10-CM | POA: Diagnosis not present

## 2022-05-23 DIAGNOSIS — E785 Hyperlipidemia, unspecified: Secondary | ICD-10-CM | POA: Diagnosis not present

## 2022-05-23 DIAGNOSIS — E034 Atrophy of thyroid (acquired): Secondary | ICD-10-CM | POA: Diagnosis not present

## 2022-05-23 DIAGNOSIS — R7303 Prediabetes: Secondary | ICD-10-CM | POA: Diagnosis not present

## 2022-05-23 DIAGNOSIS — K219 Gastro-esophageal reflux disease without esophagitis: Secondary | ICD-10-CM | POA: Diagnosis not present

## 2022-05-26 ENCOUNTER — Inpatient Hospital Stay: Payer: PPO | Attending: Oncology

## 2022-05-26 ENCOUNTER — Inpatient Hospital Stay: Payer: PPO

## 2022-05-29 ENCOUNTER — Other Ambulatory Visit: Payer: Self-pay | Admitting: *Deleted

## 2022-05-29 MED ORDER — LETROZOLE 2.5 MG PO TABS: 2.5000 mg | ORAL_TABLET | Freq: Every day | ORAL | 1 refills | Status: DC

## 2022-05-30 DIAGNOSIS — C7802 Secondary malignant neoplasm of left lung: Secondary | ICD-10-CM | POA: Diagnosis not present

## 2022-05-30 DIAGNOSIS — E034 Atrophy of thyroid (acquired): Secondary | ICD-10-CM | POA: Diagnosis not present

## 2022-05-30 DIAGNOSIS — I10 Essential (primary) hypertension: Secondary | ICD-10-CM | POA: Diagnosis not present

## 2022-05-30 DIAGNOSIS — G4733 Obstructive sleep apnea (adult) (pediatric): Secondary | ICD-10-CM | POA: Diagnosis not present

## 2022-05-30 DIAGNOSIS — R7303 Prediabetes: Secondary | ICD-10-CM | POA: Diagnosis not present

## 2022-05-30 DIAGNOSIS — K219 Gastro-esophageal reflux disease without esophagitis: Secondary | ICD-10-CM | POA: Diagnosis not present

## 2022-05-30 DIAGNOSIS — F3342 Major depressive disorder, recurrent, in full remission: Secondary | ICD-10-CM | POA: Diagnosis not present

## 2022-05-30 DIAGNOSIS — M5136 Other intervertebral disc degeneration, lumbar region: Secondary | ICD-10-CM | POA: Diagnosis not present

## 2022-05-30 DIAGNOSIS — E785 Hyperlipidemia, unspecified: Secondary | ICD-10-CM | POA: Diagnosis not present

## 2022-05-30 DIAGNOSIS — I700 Atherosclerosis of aorta: Secondary | ICD-10-CM | POA: Diagnosis not present

## 2022-05-30 DIAGNOSIS — C50919 Malignant neoplasm of unspecified site of unspecified female breast: Secondary | ICD-10-CM | POA: Diagnosis not present

## 2022-06-06 ENCOUNTER — Telehealth: Payer: Self-pay

## 2022-06-06 DIAGNOSIS — F330 Major depressive disorder, recurrent, mild: Secondary | ICD-10-CM

## 2022-06-06 DIAGNOSIS — F4310 Post-traumatic stress disorder, unspecified: Secondary | ICD-10-CM

## 2022-06-06 NOTE — Telephone Encounter (Signed)
pt needs enough medications to get to her next appt. on 06-12-22

## 2022-06-07 MED ORDER — BUSPIRONE HCL 10 MG PO TABS: 10.0000 mg | ORAL_TABLET | Freq: Two times a day (BID) | ORAL | 0 refills | Status: DC

## 2022-06-07 MED ORDER — DULOXETINE HCL 60 MG PO CPEP: 60.0000 mg | ORAL_CAPSULE | Freq: Every morning | ORAL | 0 refills | Status: DC

## 2022-06-07 MED ORDER — TRAZODONE HCL 100 MG PO TABS: ORAL_TABLET | ORAL | 0 refills | Status: DC

## 2022-06-07 NOTE — Telephone Encounter (Signed)
Pt.notified

## 2022-06-07 NOTE — Telephone Encounter (Signed)
I have sent trazodone, duloxetine and BuSpar-90 days supply to [facility].

## 2022-06-12 ENCOUNTER — Encounter: Payer: Self-pay | Admitting: Psychiatry

## 2022-06-12 ENCOUNTER — Ambulatory Visit: Payer: PPO | Admitting: Psychiatry

## 2022-06-12 VITALS — BP 146/76 | HR 87 | Temp 97.4°F | Ht 65.0 in | Wt 165.4 lb

## 2022-06-12 DIAGNOSIS — F3342 Major depressive disorder, recurrent, in full remission: Secondary | ICD-10-CM | POA: Diagnosis not present

## 2022-06-12 DIAGNOSIS — G3184 Mild cognitive impairment, so stated: Secondary | ICD-10-CM

## 2022-06-12 DIAGNOSIS — F4310 Post-traumatic stress disorder, unspecified: Secondary | ICD-10-CM

## 2022-06-12 DIAGNOSIS — F5101 Primary insomnia: Secondary | ICD-10-CM

## 2022-06-12 NOTE — Progress Notes (Signed)
[facility] MD OP Progress Note  06/12/2022 2:57 PM Breanna Zhang  MRN:  [id number]  Chief Complaint:  Chief Complaint  Patient presents with   Follow-up   Anxiety   Depression   Medication Refill   HPI: Breanna Zhang is a 82 year old Caucasian female, married, lives in [place], [place], has a history of MDD, PTSD, insomnia, ER positive breast cancer status post right mastectomy and chemotherapy, currently in remission, on letrozole, has a history of atrial fibrillation, degenerative disc disease, hypertension, obstructive sleep apnea, hypothyroidism, overactive bladder, hearing loss, was evaluated in office today.  Patient today reports she is currently doing overall well with regards to her mood.  Denies any significant depression or anxiety symptoms.  Reports she has been getting out to walk.  She has been keeping herself busy, goes to church, reads her Bible.  Patient reports overall she is compliant on her medications.  Denies side effects.  Patient reports sleep is good.  Patient continues to have short-term memory problems.  However at her visit today she appeared to be alert, oriented to person place time situation.  3 word memory immediate 3 out of 3, after 5 minutes 3 out of 3.  Patient was able to do subtraction, serial threes well.  Patient denies any other concerns today.   Visit Diagnosis:    ICD-10-CM   1. MDD (major depressive disorder), recurrent, in full remission  F33.42     2. Post traumatic stress disorder (PTSD)  F43.10     3. Primary insomnia  F51.01     4. Mild cognitive impairment  G31.84       Past Psychiatric History: Reviewed past psychiatric history from progress note on 04/11/2021.  Past Medical History:  Past Medical History:  Diagnosis Date   Anxiety    Atrial fibrillation    Breast cancer    Breast cancer, right 1999   RT MASTECTOMY and chemo tx's.    DDD (degenerative disc disease), cervical    hands and back with arthritis   Depression     Diverticulosis    Diverticulosis    DVT of lower extremity (deep venous thrombosis)    Dyspnea    Family history of colon cancer    Family history of lung cancer    Family history of throat cancer    GERD (gastroesophageal reflux disease)    Goiter    Headache    History of chemotherapy 2000   BREAST CA   Hypertension    Incontinence    Neuromuscular disorder    peipheral neuropathy   OSA (obstructive sleep apnea)    Osteoporosis    Personal history of chemotherapy 1999   BREAST CA   Status post chemotherapy    2000 right breast cancer   Thyroid disease    UTI (urinary tract infection)    Vertigo     Past Surgical History:  Procedure Laterality Date   ABDOMINAL HYSTERECTOMY     BREAST BIOPSY Left 2006   negative   BUNIONECTOMY Bilateral    Screws implanted.   CHOLECYSTECTOMY     COCHLEAR IMPLANT Right    COLONOSCOPY WITH PROPOFOL N/A 07/24/2016   Procedure: COLONOSCOPY WITH PROPOFOL;  Surgeon: Scot Jun, MD;  Location: [facility] ENDOSCOPY;  Service: Endoscopy;  Laterality: N/A;   ESOPHAGOGASTRODUODENOSCOPY (EGD) WITH PROPOFOL N/A 04/12/2016   Procedure: ESOPHAGOGASTRODUODENOSCOPY (EGD) WITH PROPOFOL;  Surgeon: Scot Jun, MD;  Location[facility] ENDOSCOPY;  Service: Endoscopy;  Laterality: N/A;   ESOPHAGOGASTRODUODENOSCOPY (EGD)  WITH PROPOFOL N/A 05/03/2022   Procedure: ESOPHAGOGASTRODUODENOSCOPY (EGD) WITH PROPOFOL;  Surgeon: Toledo, Boykin Nearing, MD;  Location: [facility] ENDOSCOPY;  Service: Gastroenterology;  Laterality: N/A;   EYE SURGERY Bilateral    cataract   FOOT SURGERY Bilateral    HAND SURGERY Right    KYPHOPLASTY N/A 05/20/2020   Procedure: L3 KYPHOPLASTY;  Surgeon: Kennedy Bucker, MD;  Location: [facility] ORS;  Service: Orthopedics;  Laterality: N/A;   MASTECTOMY Right 1999   BREAST CA   MASTECTOMY Right 1999   PARTIAL COLECTOMY     THYROIDECTOMY     TOTAL HIP ARTHROPLASTY Left 03/19/2018   Procedure: TOTAL HIP ARTHROPLASTY LEFT;  Surgeon: Kennedy Bucker, MD;   Location: [facility] ORS;  Service: Orthopedics;  Laterality: Left;   VIDEO ASSISTED THORACOSCOPY Left 09/10/2017   Procedure: VIDEO ASSISTED THORACOSCOPY;  Surgeon: Hulda Marin, MD;  Location: [facility] ORS;  Service: Thoracic;  Laterality: Left;  with biopsies   VIDEO BRONCHOSCOPY  09/10/2017   Procedure: VIDEO BRONCHOSCOPY;  Surgeon: Hulda Marin, MD;  Location: [facility] ORS;  Service: Thoracic;;    Family Psychiatric History: Reviewed family psychiatric history from progress note on 04/11/2021.  Family History:  Family History  Problem Relation Age of Onset   Dementia Mother    Dementia Sister    Diabetes Sister    Heart attack Brother 62   COPD Brother    Lung cancer Brother        hx smoking   Colon cancer Brother    Liver cancer Brother    COPD Brother    Lung cancer Brother        hx smoking   Colon cancer Maternal Grandfather        dx >.50   Breast cancer Neg Hx     Social History: Reviewed social history from progress note on 04/11/2021. Social History   Socioeconomic History   Marital status: Married    Spouse name: evert   Number of children: 4   Years of education: Not on file   Highest education level: 9th grade  Occupational History   Not on file  Tobacco Use   Smoking status: Never   Smokeless tobacco: Never  Vaping Use   Vaping Use: Never used  Substance and Sexual Activity   Alcohol use: No    Alcohol/week: 0.0 standard drinks of alcohol   Drug use: No   Sexual activity: Not Currently  Other Topics Concern   Not on file  Social History Narrative   Not on file   Social Determinants of Health   Financial Resource Strain: Medium Risk (09/24/2017)   Overall Financial Resource Strain (CARDIA)    Difficulty of Paying Living Expenses: Somewhat hard  Food Insecurity: Food Insecurity Present (09/24/2017)   Hunger Vital Sign    Worried About Running Out of Food in the Last Year: Often true    Ran Out of Food in the Last Year: Often true  Transportation Needs: No  Transportation Needs (09/24/2017)   PRAPARE - Administrator, Civil Service (Medical): No    Lack of Transportation (Non-Medical): No  Physical Activity: Inactive (09/24/2017)   Exercise Vital Sign    Days of Exercise per Week: 0 days    Minutes of Exercise per Session: 0 min  Stress: No Stress Concern Present (09/24/2017)   [employer] of Occupational Health - Occupational Stress Questionnaire    Feeling of Stress : Not at all  Social Connections: Unknown (09/24/2017)   Social Connection and Isolation  Panel [NHANES]    Frequency of Communication with Friends and Family: Not on file    Frequency of Social Gatherings with Friends and Family: Not on file    Attends Religious Services: More than 4 times per year    Active Member of [employer] or Organizations: No    Attends Banker Meetings: Never    Marital Status: Married    Allergies:  Allergies  Allergen Reactions   Imipramine Tinitus   Latex Rash   Penicillins Rash   Tape Rash    Metabolic Disorder Labs: No results found for: "HGBA1C", "MPG" No results found for: "PROLACTIN" No results found for: "CHOL", "TRIG", "HDL", "CHOLHDL", "VLDL", "LDLCALC" Lab Results  Component Value Date   TSH 0.275 (L) 11/09/2012   TSH 0.443 (L) 10/12/2012    Therapeutic Level Labs: No results found for: "LITHIUM" No results found for: "VALPROATE" No results found for: "CBMZ"  Current Medications: Current Outpatient Medications  Medication Sig Dispense Refill   acetaminophen (TYLENOL) 325 MG tablet Take 650 mg by mouth every 4 (four) hours as needed. For pain / increased temp.  May be administered orally,  per G-Tube if needed or rectally if unable to swallow (separate order).  Maximum dose for 24 hours is 3,000 mg from all sources of Acetaminophen / Tylenol     atorvastatin (LIPITOR) 40 MG tablet Take 1 tablet (40 mg total) by mouth at bedtime. 30 tablet 0   Baclofen 5 MG TABS Take 1 tablet by mouth 2 (two) times  daily.     busPIRone (BUSPAR) 10 MG tablet Take 1 tablet (10 mg total) by mouth 2 (two) times daily. 180 tablet 0   cholecalciferol (VITAMIN D) 1000 units tablet Take 1,000 Units by mouth daily.     DULoxetine (CYMBALTA) 60 MG capsule Take 1 capsule (60 mg total) by mouth every morning. 90 capsule 0   FLUAD QUADRIVALENT 0.5 ML injection      gabapentin (NEURONTIN) 300 MG capsule Take 300 mg by mouth 2 (two) times daily.     HYDROcodone-acetaminophen (NORCO/VICODIN) 5-325 MG tablet Take by mouth.     letrozole (FEMARA) 2.5 MG tablet Take 1 tablet (2.5 mg total) by mouth daily. 90 tablet 1   mirabegron ER (MYRBETRIQ) 50 MG TB24 tablet Take 1 tablet (50 mg total) by mouth daily. 30 tablet 11   MODERNA COVID-19 BIVAL BOOSTER 50 MCG/0.5ML injection      MODERNA COVID-19 VACCINE 100 MCG/0.5ML injection      ondansetron (ZOFRAN) 4 MG tablet Take 1 tablet (4 mg total) by mouth every 6 (six) hours as needed for nausea or vomiting. 30 tablet 1   pantoprazole (PROTONIX) 40 MG tablet Take 1 tablet (40 mg total) by mouth 2 (two) times daily. 60 tablet 0   traZODone (DESYREL) 100 MG tablet TAKE 1 TABLET BY MOUTH EVERYDAY AT BEDTIME 90 tablet 0   triamcinolone cream (KENALOG) 0.1 % SMARTSIG:1 Application Topical 2-3 Times Daily     vitamin B-12 (CYANOCOBALAMIN) 500 MCG tablet Take 500 mcg by mouth daily.      levothyroxine (SYNTHROID) 100 MCG tablet Take 1 tablet by mouth daily.     No current facility-administered medications for this visit.     Musculoskeletal: Strength & Muscle Tone: within normal limits Gait & Station:  walks with cane Patient leans: N/A  Psychiatric Specialty Exam: Review of Systems  Psychiatric/Behavioral: Negative.    All other systems reviewed and are negative.   Blood pressure (!) 146/76, pulse 87,  temperature (!) 97.4 F (36.3 C), temperature source Skin, height 5\' 5"  (1.651 m), weight 165 lb 6.4 oz (75 kg).Body mass index is 27.52 kg/m.  General Appearance: Casual   Eye Contact:  Fair  Speech:  Clear and Coherent  Volume:  Normal  Mood:  Euthymic  Affect:  Congruent  Thought Process:  Goal Directed and Descriptions of Associations: Intact  Orientation:  Full (Time, Place, and Person)  Thought Content: Logical   Suicidal Thoughts:  No  Homicidal Thoughts:  No  Memory:  Immediate;   Fair Recent;   Fair Remote;   limited  Judgement:  Fair  Insight:  Fair  Psychomotor Activity:  Normal  Concentration:  Concentration: Fair and Attention Span: Fair  Recall:  [employer] of Knowledge: Fair  Language: Fair  Akathisia:  No  Handed:  Right  AIMS (if indicated): not done  Assets:  Communication Skills Desire for Improvement Housing Social Support  ADL's:  Intact  Cognition: WNL  Sleep:  Fair   Screenings: GAD-7    Flowsheet Row Office Visit from 06/12/2022 in [facility]  Total GAD-7 Score 6      PHQ2-9    Flowsheet Row Office Visit from 06/12/2022 in [facility] Office Visit from 11/16/2021 in [facility] Office Visit from 04[facility]11/2021 in [facility]  PHQ-2 Total Score 0 0 2  PHQ-9 Total Score 1 5 4       Flowsheet Row Office Visit from 06/12/2022 in [facility] Admission (Discharged) from 05/03/2022 in [facility] Office Visit from 11/16/2021 in [facility]  C-SSRS RISK CATEGORY No Risk No Risk No Risk        Assessment and Plan: Breanna Zhang is a 82 year old Caucasian female, married, on SSI, lives in [place] with her husband, has a history of MDD, PTSD, insomnia, multiple other medical problems was evaluated in office today.  Patient is currently stable with regards to her mood although continues to have short-term memory problems, will benefit from the following  plan.  Plan MDD in remission Cymbalta 60 mg p.o. daily Discussed referral for CBT in the past-patient declines  PTSD-stable Cymbalta 60 mg p.o. daily BuSpar 10 mg p.o. twice daily  Insomnia-stable Trazodone 100 mg p.o. nightly Continue sleep hygiene techniques  Mild cognitive impairment-unstable MMSE completed-11/16/2021-22 out of 30. Patient currently cognitively improved compared to last visit.  However will continue to monitor, repeat MMSE in future sessions. Will consider referral to neurology as needed  Follow-up in clinic in 4 to 5 months or sooner if needed.  Collaboration of Care: Collaboration of Care: Other patient with a liver to blood pressure reading in session, blood pressure repeated, advised to follow-up with primary care provider.  Patient/Guardian was advised Release of Information must be obtained prior to any record release in order to collaborate their care with an outside provider. Patient/Guardian was advised if they have not already done so to contact the registration department to sign all necessary forms in order for [place] to release information regarding their care.   Consent: Patient/Guardian gives verbal consent for treatment and assignment of benefits for services provided during this visit. Patient/Guardian expressed understanding and agreed to proceed.   This note was generated in part or whole with voice recognition software. Voice recognition is usually quite accurate but there are transcription errors that can and very  often do occur. I apologize for any typographical errors that were not detected and corrected.    Jomarie Longs, MD 06/14/2022, 11:58 AM

## 2022-06-15 DIAGNOSIS — H35372 Puckering of macula, left eye: Secondary | ICD-10-CM | POA: Diagnosis not present

## 2022-06-15 DIAGNOSIS — H353221 Exudative age-related macular degeneration, left eye, with active choroidal neovascularization: Secondary | ICD-10-CM | POA: Diagnosis not present

## 2022-06-15 DIAGNOSIS — H353211 Exudative age-related macular degeneration, right eye, with active choroidal neovascularization: Secondary | ICD-10-CM | POA: Diagnosis not present

## 2022-06-15 DIAGNOSIS — H43813 Vitreous degeneration, bilateral: Secondary | ICD-10-CM | POA: Diagnosis not present

## 2022-07-03 ENCOUNTER — Telehealth: Payer: Self-pay

## 2022-07-03 NOTE — Telephone Encounter (Signed)
  called pharamcy they have a rx on hold will go ahead and process.      Disp Refills Start End    busPIRone (BUSPAR) 10 MG tablet 180 tablet 0 06/07/2022    Sig - Route: Take 1 tablet (10 mg total) by mouth 2 (two) times daily. - Oral   Sent to pharmacy as: busPIRone (BUSPAR) 10 MG tablet   E-Prescribing Status: Receipt confirmed by pharmacy (06/07/2022  3:11 PM EDT)

## 2022-07-03 NOTE — Telephone Encounter (Signed)
pt left a message that she needed refill on buspar .  Pt was last seen 06-12-22 next appt 11-15-22

## 2022-07-11 DIAGNOSIS — M5136 Other intervertebral disc degeneration, lumbar region: Secondary | ICD-10-CM | POA: Diagnosis not present

## 2022-07-11 DIAGNOSIS — M48062 Spinal stenosis, lumbar region with neurogenic claudication: Secondary | ICD-10-CM | POA: Diagnosis not present

## 2022-07-11 DIAGNOSIS — Z79899 Other long term (current) drug therapy: Secondary | ICD-10-CM | POA: Diagnosis not present

## 2022-07-11 DIAGNOSIS — M5416 Radiculopathy, lumbar region: Secondary | ICD-10-CM | POA: Diagnosis not present

## 2022-08-04 ENCOUNTER — Emergency Department: Payer: PPO

## 2022-08-04 ENCOUNTER — Emergency Department
Admission: EM | Admit: 2022-08-04 | Discharge: 2022-08-04 | Disposition: A | Discharge status: Home / Self Care | Payer: PPO | Attending: Student in an Organized Health Care Education/Training Program | Admitting: Student in an Organized Health Care Education/Training Program

## 2022-08-04 ENCOUNTER — Other Ambulatory Visit: Payer: Self-pay

## 2022-08-04 DIAGNOSIS — R002 Palpitations: Secondary | ICD-10-CM | POA: Diagnosis not present

## 2022-08-04 DIAGNOSIS — I10 Essential (primary) hypertension: Secondary | ICD-10-CM | POA: Diagnosis not present

## 2022-08-04 DIAGNOSIS — R200 Anesthesia of skin: Secondary | ICD-10-CM | POA: Diagnosis not present

## 2022-08-04 DIAGNOSIS — K449 Diaphragmatic hernia without obstruction or gangrene: Secondary | ICD-10-CM | POA: Diagnosis not present

## 2022-08-04 DIAGNOSIS — I63233 Cerebral infarction due to unspecified occlusion or stenosis of bilateral carotid arteries: Secondary | ICD-10-CM | POA: Diagnosis not present

## 2022-08-04 DIAGNOSIS — R079 Chest pain, unspecified: Secondary | ICD-10-CM | POA: Diagnosis not present

## 2022-08-04 LAB — URINALYSIS, ROUTINE W REFLEX MICROSCOPIC
Bilirubin Urine: NEGATIVE
Glucose, UA: NEGATIVE mg/dL
Hgb urine dipstick: NEGATIVE
Ketones, ur: NEGATIVE mg/dL
Leukocytes,Ua: NEGATIVE
Nitrite: NEGATIVE
Protein, ur: NEGATIVE mg/dL
Specific Gravity, Urine: 1.003 — ABNORMAL LOW (ref 1.005–1.030)
pH: 7 (ref 5.0–8.0)

## 2022-08-04 LAB — TROPONIN I (HIGH SENSITIVITY)
Troponin I (High Sensitivity): 3 ng/L (ref <18)
Troponin I (High Sensitivity): 4 ng/L (ref <18)

## 2022-08-04 LAB — CBC WITH DIFFERENTIAL/PLATELET
Abs Immature Granulocytes: 0.03 10*3/uL (ref 0.00–0.07)
Basophils Absolute: 0.1 10*3/uL (ref 0.0–0.1)
Basophils Relative: 1 %
Eosinophils Absolute: 0.1 10*3/uL (ref 0.0–0.5)
Eosinophils Relative: 1 %
HCT: 39.1 % (ref 36.0–46.0)
Hemoglobin: 12.4 g/dL (ref 12.0–15.0)
Immature Granulocytes: 0 %
Lymphocytes Relative: 37 %
Lymphs Abs: 2.7 10*3/uL (ref 0.7–4.0)
MCH: 28.7 pg (ref 26.0–34.0)
MCHC: 31.7 g/dL (ref 30.0–36.0)
MCV: 90.5 fL (ref 80.0–100.0)
Monocytes Absolute: 0.8 10*3/uL (ref 0.1–1.0)
Monocytes Relative: 11 %
Neutro Abs: 3.6 10*3/uL (ref 1.7–7.7)
Neutrophils Relative %: 50 %
Platelets: 226 10*3/uL (ref 150–400)
RBC: 4.32 MIL/uL (ref 3.87–5.11)
RDW: 12.6 % (ref 11.5–15.5)
WBC: 7.2 10*3/uL (ref 4.0–10.5)
nRBC: 0 % (ref 0.0–0.2)

## 2022-08-04 LAB — COMPREHENSIVE METABOLIC PANEL
ALT: 15 U/L (ref 0–44)
AST: 24 U/L (ref 15–41)
Albumin: 4.1 g/dL (ref 3.5–5.0)
Alkaline Phosphatase: 61 U/L (ref 38–126)
Anion gap: 8 (ref 5–15)
BUN: 10 mg/dL (ref 8–23)
CO2: 26 mmol/L (ref 22–32)
Calcium: 8.7 mg/dL — ABNORMAL LOW (ref 8.9–10.3)
Chloride: 105 mmol/L (ref 98–111)
Creatinine, Ser: 0.81 mg/dL (ref 0.44–1.00)
GFR, Estimated: >60 mL/min (ref >60)
Glucose, Bld: 89 mg/dL (ref 70–99)
Potassium: 3.7 mmol/L (ref 3.5–5.1)
Sodium: 139 mmol/L (ref 135–145)
Total Bilirubin: 0.9 mg/dL (ref 0.3–1.2)
Total Protein: 7.3 g/dL (ref 6.5–8.1)

## 2022-08-04 MED ORDER — IOHEXOL 350 MG/ML SOLN
75.0000 mL | Freq: Once | INTRAVENOUS | Status: AC | PRN
  Administered 2022-08-04: 75 mL via INTRAVENOUS

## 2022-08-04 NOTE — ED Notes (Signed)
Hard copy of EKG handed directly to Roxan Hockey, MD

## 2022-08-04 NOTE — ED Triage Notes (Signed)
EMS states patient was cleaning bathroom at 0930 and developed palpitations and numbness to left cheek.

## 2022-08-04 NOTE — ED Provider Notes (Signed)
[facility] Provider Note    Event Date/Time   First MD Initiated Contact with Patient 08/04/22 1244     (approximate)   History   Palpitations   HPI  Breanna Zhang is a 82 y.o. female   with an extensive past medical history presents to the ER for evaluation of chest discomfort palpitations headache and some tingling in the left side of her body.  Tingling sensation last about an hour started this morning.  Has resolved.  Feels like something is still little bit different but no weakness.  Denies any chest pain or pressure right now.  No recent stressors no fevers.  No shortness of breath.      Physical Exam   Triage Vital Signs: ED Triage Vitals [08/04/22 1226]  Enc Vitals Group     BP (!) 179/87     Pulse Rate 81     Resp 20     Temp 98.1 F (36.7 C)     Temp Source Oral     SpO2 100 %     Weight      Height      Head Circumference      Peak Flow      Pain Score      Pain Loc      Pain Edu?      Excl. in GC?     Most recent vital signs: Vitals:   08/04/22 1330 08/04/22 1430  BP: (!) 144/78 (!) 154/81  Pulse: 77 77  Resp: 19 (!) 24  Temp:    SpO2: 95% 100%     Constitutional: Alert  Eyes: Conjunctivae are normal.  Head: Atraumatic. Nose: No congestion/rhinnorhea. Mouth/Throat: Mucous membranes are moist.   Neck: Painless ROM.  Cardiovascular:   Good peripheral circulation. Respiratory: Normal respiratory effort.  No retractions.  Gastrointestinal: Soft and nontender.  Musculoskeletal:  no deformity Neurologic:  MAE spontaneously. No gross focal neurologic deficits are appreciated.  Skin:  Skin is warm, dry and intact. No rash noted. Psychiatric: Mood and affect are normal. Speech and behavior are normal.    ED Results / Procedures / Treatments   Labs (all labs ordered are listed, but only abnormal results are displayed) Labs Reviewed  COMPREHENSIVE METABOLIC PANEL - Abnormal; Notable for the following components:       Result Value   Calcium 8.7 (*)    All other components within normal limits  CBC WITH DIFFERENTIAL/PLATELET  URINALYSIS, ROUTINE W REFLEX MICROSCOPIC  TROPONIN I (HIGH SENSITIVITY)  TROPONIN I (HIGH SENSITIVITY)     EKG  ED ECG REPORT I, Willy Eddy, the attending physician, personally viewed and interpreted this ECG.   Date: 08/04/2022  EKG Time: 14:11  Rate: 70  Rhythm: sinus  Axis: normal  Intervals: normal  ST&T Change: no stemi, no depressions    RADIOLOGY Please see ED Course for my review and interpretation.  I personally reviewed all radiographic images ordered to evaluate for the above acute complaints and reviewed radiology reports and findings.  These findings were personally discussed with the patient.  Please see medical record for radiology report.    PROCEDURES:  Critical Care performed: No  Procedures   MEDICATIONS ORDERED IN ED: Medications  iohexol (OMNIPAQUE) 350 MG/ML injection 75 mL (75 mLs Intravenous Contrast Given 08/04/22 1345)     IMPRESSION / MDM / ASSESSMENT AND PLAN / ED COURSE  I reviewed the triage vital signs and the nursing notes.  Differential diagnosis includes, but is not limited to, ACS, dysrhythmia, TIA, CVA, mass, migraine, dissection  Patient presenting to the ER for evaluation of symptoms as described above.  Based on symptoms, risk factors and considered above differential, this presenting complaint could reflect a potentially life-threatening illness therefore the patient will be placed on continuous pulse oximetry and telemetry for monitoring.  Laboratory evaluation will be sent to evaluate for the above complaints.      Clinical Course as of 08/04/22 1508  Fri Aug 04, 2022  1404 Chest x-ray on my review and interpretation without evidence of consolidation or pneumothorax. [PR]  1441 Patient reassessed.  Remains well-appearing in no acute distress.  Initial troponin negative.   CTA without any evidence of LVO, dissection or stenosis no sign of stroke.  Clinically this not consistent with stroke and low suspicion for TIA.  Will observe on telemetry for repeat troponin and if negative will be appropriate for outpatient follow-up.  Patient will be signed out to oncoming physician. [PR]    Clinical Course User Index [PR] Willy Eddy, MD     FINAL CLINICAL IMPRESSION(S) / ED DIAGNOSES   Final diagnoses:  Palpitations     Rx / DC Orders   ED Discharge Orders     None        Note:  This document was prepared using Dragon voice recognition software and may include unintentional dictation errors.    Willy Eddy, MD 08/04/22 [phone]

## 2022-08-16 DIAGNOSIS — H353221 Exudative age-related macular degeneration, left eye, with active choroidal neovascularization: Secondary | ICD-10-CM | POA: Diagnosis not present

## 2022-08-17 DIAGNOSIS — C7802 Secondary malignant neoplasm of left lung: Secondary | ICD-10-CM | POA: Diagnosis not present

## 2022-08-17 DIAGNOSIS — M5136 Other intervertebral disc degeneration, lumbar region: Secondary | ICD-10-CM | POA: Diagnosis not present

## 2022-08-17 DIAGNOSIS — F3342 Major depressive disorder, recurrent, in full remission: Secondary | ICD-10-CM | POA: Diagnosis not present

## 2022-08-17 DIAGNOSIS — C50919 Malignant neoplasm of unspecified site of unspecified female breast: Secondary | ICD-10-CM | POA: Diagnosis not present

## 2022-08-17 DIAGNOSIS — R002 Palpitations: Secondary | ICD-10-CM | POA: Diagnosis not present

## 2022-08-17 DIAGNOSIS — I700 Atherosclerosis of aorta: Secondary | ICD-10-CM | POA: Diagnosis not present

## 2022-08-25 DIAGNOSIS — R002 Palpitations: Secondary | ICD-10-CM | POA: Diagnosis not present

## 2022-09-04 ENCOUNTER — Telehealth: Payer: Self-pay

## 2022-09-04 DIAGNOSIS — F330 Major depressive disorder, recurrent, mild: Secondary | ICD-10-CM

## 2022-09-04 MED ORDER — TRAZODONE HCL 100 MG PO TABS: ORAL_TABLET | ORAL | 0 refills | Status: DC

## 2022-09-04 NOTE — Telephone Encounter (Signed)
Rx sent 

## 2022-09-04 NOTE — Telephone Encounter (Signed)
pt states she needs a refill on the trazodone. pt last seen on 4-1 next appt 9-4

## 2022-09-04 NOTE — Telephone Encounter (Signed)
Pt.notified

## 2022-09-22 DIAGNOSIS — H353221 Exudative age-related macular degeneration, left eye, with active choroidal neovascularization: Secondary | ICD-10-CM | POA: Diagnosis not present

## 2022-09-22 DIAGNOSIS — H353211 Exudative age-related macular degeneration, right eye, with active choroidal neovascularization: Secondary | ICD-10-CM | POA: Diagnosis not present

## 2022-09-27 ENCOUNTER — Other Ambulatory Visit (HOSPITAL_COMMUNITY): Payer: Self-pay | Admitting: Psychiatry

## 2022-09-27 DIAGNOSIS — F4310 Post-traumatic stress disorder, unspecified: Secondary | ICD-10-CM

## 2022-09-27 DIAGNOSIS — F330 Major depressive disorder, recurrent, mild: Secondary | ICD-10-CM

## 2022-09-27 MED ORDER — DULOXETINE HCL 60 MG PO CPEP: 60.0000 mg | ORAL_CAPSULE | Freq: Every morning | ORAL | 0 refills | Status: DC

## 2022-10-02 ENCOUNTER — Other Ambulatory Visit: Payer: Self-pay | Admitting: *Deleted

## 2022-10-02 ENCOUNTER — Encounter: Payer: Self-pay | Admitting: Oncology

## 2022-10-02 MED ORDER — ONDANSETRON HCL 4 MG PO TABS: 4.0000 mg | ORAL_TABLET | Freq: Four times a day (QID) | ORAL | 1 refills | Status: DC | PRN

## 2022-10-02 NOTE — Progress Notes (Signed)
Pt needed refill and it was sent in

## 2022-10-05 DIAGNOSIS — C7802 Secondary malignant neoplasm of left lung: Secondary | ICD-10-CM | POA: Diagnosis not present

## 2022-10-05 DIAGNOSIS — I700 Atherosclerosis of aorta: Secondary | ICD-10-CM | POA: Diagnosis not present

## 2022-10-05 DIAGNOSIS — M7989 Other specified soft tissue disorders: Secondary | ICD-10-CM | POA: Diagnosis not present

## 2022-10-05 DIAGNOSIS — C50919 Malignant neoplasm of unspecified site of unspecified female breast: Secondary | ICD-10-CM | POA: Diagnosis not present

## 2022-10-05 DIAGNOSIS — F3342 Major depressive disorder, recurrent, in full remission: Secondary | ICD-10-CM | POA: Diagnosis not present

## 2022-10-05 DIAGNOSIS — I83812 Varicose veins of left lower extremities with pain: Secondary | ICD-10-CM | POA: Diagnosis not present

## 2022-10-09 ENCOUNTER — Other Ambulatory Visit: Payer: Self-pay | Admitting: Psychiatry

## 2022-10-09 ENCOUNTER — Other Ambulatory Visit: Payer: Self-pay | Admitting: Internal Medicine

## 2022-10-09 DIAGNOSIS — F4310 Post-traumatic stress disorder, unspecified: Secondary | ICD-10-CM

## 2022-10-09 DIAGNOSIS — F330 Major depressive disorder, recurrent, mild: Secondary | ICD-10-CM

## 2022-10-09 DIAGNOSIS — M7989 Other specified soft tissue disorders: Secondary | ICD-10-CM

## 2022-10-09 MED ORDER — BUSPIRONE HCL 10 MG PO TABS
10.0000 mg | ORAL_TABLET | Freq: Two times a day (BID) | ORAL | 1 refills | Status: DC
Start: 2022-10-09 — End: 2023-07-03

## 2022-10-10 ENCOUNTER — Encounter: Payer: Self-pay | Admitting: Family Medicine

## 2022-10-10 ENCOUNTER — Other Ambulatory Visit: Payer: Self-pay | Admitting: Family Medicine

## 2022-10-10 DIAGNOSIS — S32029A Unspecified fracture of second lumbar vertebra, initial encounter for closed fracture: Secondary | ICD-10-CM | POA: Diagnosis not present

## 2022-10-10 DIAGNOSIS — Z96642 Presence of left artificial hip joint: Secondary | ICD-10-CM | POA: Diagnosis not present

## 2022-10-10 DIAGNOSIS — M5136 Other intervertebral disc degeneration, lumbar region: Secondary | ICD-10-CM | POA: Diagnosis not present

## 2022-10-10 DIAGNOSIS — M48062 Spinal stenosis, lumbar region with neurogenic claudication: Secondary | ICD-10-CM | POA: Diagnosis not present

## 2022-10-10 DIAGNOSIS — M5416 Radiculopathy, lumbar region: Secondary | ICD-10-CM | POA: Diagnosis not present

## 2022-10-10 DIAGNOSIS — M8580 Other specified disorders of bone density and structure, unspecified site: Secondary | ICD-10-CM | POA: Diagnosis not present

## 2022-10-11 ENCOUNTER — Ambulatory Visit
Admission: RE | Admit: 2022-10-11 | Discharge: 2022-10-11 | Disposition: A | Discharge status: Home / Self Care | Payer: PPO | Source: Ambulatory Visit | Attending: Internal Medicine | Admitting: Internal Medicine

## 2022-10-11 DIAGNOSIS — M7989 Other specified soft tissue disorders: Secondary | ICD-10-CM | POA: Diagnosis not present

## 2022-10-11 DIAGNOSIS — R600 Localized edema: Secondary | ICD-10-CM | POA: Diagnosis not present

## 2022-10-16 ENCOUNTER — Ambulatory Visit: Payer: PPO | Admitting: Psychiatry

## 2022-10-20 ENCOUNTER — Inpatient Hospital Stay: Payer: PPO | Attending: Oncology | Admitting: Oncology

## 2022-10-20 ENCOUNTER — Encounter: Payer: Self-pay | Admitting: Oncology

## 2022-10-20 VITALS — BP 124/70 | HR 74 | Temp 95.4°F | Resp 18 | Ht 65.0 in | Wt 163.4 lb

## 2022-10-20 DIAGNOSIS — Z853 Personal history of malignant neoplasm of breast: Secondary | ICD-10-CM | POA: Insufficient documentation

## 2022-10-20 DIAGNOSIS — M8588 Other specified disorders of bone density and structure, other site: Secondary | ICD-10-CM | POA: Diagnosis not present

## 2022-10-20 DIAGNOSIS — Z79811 Long term (current) use of aromatase inhibitors: Secondary | ICD-10-CM | POA: Diagnosis not present

## 2022-10-20 DIAGNOSIS — Z170 Estrogen receptor positive status [ER+]: Secondary | ICD-10-CM | POA: Diagnosis not present

## 2022-10-20 DIAGNOSIS — C782 Secondary malignant neoplasm of pleura: Secondary | ICD-10-CM | POA: Insufficient documentation

## 2022-10-20 DIAGNOSIS — C50919 Malignant neoplasm of unspecified site of unspecified female breast: Secondary | ICD-10-CM | POA: Diagnosis not present

## 2022-10-20 MED ORDER — LACTULOSE 20 GM/30ML PO SOLN: 15.0000 mL | Freq: Two times a day (BID) | ORAL | 0 refills | Status: DC | PRN

## 2022-10-20 NOTE — Progress Notes (Signed)
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  She underwent mastectomy followed by adjuvant chemotherapy and 5 years of tamoxifen.  Patient has been having some low back pain and underwent a CT lumbar spine without contrast on 08/10/2017 which was done by the pain clinic.  CT mainly showed age-related degenerative disease but also showed incidental nodular thickening of the posterior left diaphragm concerning for tumor and a CT chest was recommended.   CT chest on 08/14/2017 showed scattered left pleural nodularity with tiny left pleural effusion and prominent left internal mammary and juxtadiaphragmatic lymph nodes which are nonspecific but metastatic disease could not be ruled out.   This was followed by a PET/CT scan on 08/22/2017 which showed numerous small left-sided pleural nodules which were mildly hypermetabolic along with hypermetabolic mediastinal lymph nodes concerning for metastatic disease.  Small metastatic nodule in the left upper quadrant partly in the retrocrural space.  No findings of pulmonary metastatic or osseous metastatic disease.    Patient underwent FNA of retrocrural LN which was positive for carcinoma but primary could not be ascertained. She then  underwent pleural biopsy which showed metastatic carcinoma consistent with breast primary. ER+ HER-2.  Patient started taking Ibrance in August 2019.  Ibrance on hold since January 2021 with stable disease on letrozole   NGS testing showed PIKCA and BRCA mutation     Interval history-patient has chronic back pain as well as joint pain which has been evaluated in the past with scans as well and has not shown any evidence of metastatic disease.  She is tolerating letrozole well without any significant side effects.  Over the last 2 weeks patient has been experiencing more constipation and over the counter bowel meds have not helped  ECOG PS- 2 Pain scale- 5 Opioid associated constipation- no  Review of systems- Review of Systems  Constitutional:  Positive for malaise/fatigue. Negative for chills, fever and weight loss.  HENT:  Negative for congestion, ear discharge and nosebleeds.   Eyes:  Negative for blurred vision.  Respiratory:  Negative for cough, hemoptysis, sputum production, shortness of breath and wheezing.   Cardiovascular:  Negative for chest pain, palpitations, orthopnea and claudication.  Gastrointestinal:  Positive for constipation. Negative for abdominal pain, blood in stool, diarrhea, heartburn, melena, nausea and vomiting.  Genitourinary:  Negative for dysuria, flank pain, frequency, hematuria and urgency.  Musculoskeletal:  Positive for joint pain. Negative for back pain and myalgias.  Skin:  Negative for rash.  Neurological:  Negative for dizziness, tingling, focal weakness, seizures, weakness and headaches.  Endo/Heme/Allergies:  Does not bruise/bleed easily.  Psychiatric/Behavioral:  Negative for depression and suicidal ideas. The patient does not have insomnia.       Allergies  Allergen Reactions   Imipramine Tinitus   Latex Rash   Penicillins Rash   Tape Rash  Past Medical History:  Diagnosis Date   Anxiety    Atrial fibrillation (HCC)    Breast cancer  (HCC)    Breast cancer, right (HCC) 1999   RT MASTECTOMY and chemo tx's.    DDD (degenerative disc disease), cervical    hands and back with arthritis   Depression    Diverticulosis    Diverticulosis    DVT of lower extremity (deep venous thrombosis) (HCC)    Dyspnea    Family history of colon cancer    Family history of lung cancer    Family history of throat cancer    GERD (gastroesophageal reflux disease)    Goiter    Headache    History of chemotherapy 2000   BREAST CA   Hypertension    Incontinence    Neuromuscular disorder (HCC)    peipheral neuropathy   OSA (obstructive sleep apnea)    Osteoporosis    Personal history of chemotherapy 1999   BREAST CA   Status post chemotherapy    2000 right breast cancer   Thyroid disease    UTI (urinary tract infection)    Vertigo      Past Surgical History:  Procedure Laterality Date   ABDOMINAL HYSTERECTOMY     BREAST BIOPSY Left 2006   negative   BUNIONECTOMY Bilateral    Screws implanted.   CHOLECYSTECTOMY     COCHLEAR IMPLANT Right    COLONOSCOPY WITH PROPOFOL N/A 07/24/2016   Procedure: COLONOSCOPY WITH PROPOFOL;  Surgeon: Scot Jun, MD;  Location: [facility] ENDOSCOPY;  Service: Endoscopy;  Laterality: N/A;   ESOPHAGOGASTRODUODENOSCOPY (EGD) WITH PROPOFOL N/A 04/12/2016   Procedure: ESOPHAGOGASTRODUODENOSCOPY (EGD) WITH PROPOFOL;  Surgeon: Scot Jun, MD;  Location: [facility] ENDOSCOPY;  Service: Endoscopy;  Laterality: N/A;   ESOPHAGOGASTRODUODENOSCOPY (EGD) WITH PROPOFOL N/A 05/03/2022   Procedure: ESOPHAGOGASTRODUODENOSCOPY (EGD) WITH PROPOFOL;  Surgeon: Toledo, Boykin Nearing, MD;  Location: [facility] ENDOSCOPY;  Service: Gastroenterology;  Laterality: N/A;   EYE SURGERY Bilateral    cataract   FOOT SURGERY Bilateral    HAND SURGERY Right    KYPHOPLASTY N/A 05/20/2020   Procedure: L3 KYPHOPLASTY;  Surgeon: Kennedy Bucker, MD;  Location: [facility] ORS;  Service: Orthopedics;  Laterality: N/A;   MASTECTOMY Right 1999   BREAST  CA   MASTECTOMY Right 1999   PARTIAL COLECTOMY     THYROIDECTOMY     TOTAL HIP ARTHROPLASTY Left 03/19/2018   Procedure: TOTAL HIP ARTHROPLASTY LEFT;  Surgeon: Kennedy Bucker, MD;  Location: [facility] ORS;  Service: Orthopedics;  Laterality: Left;   VIDEO ASSISTED THORACOSCOPY Left 09/10/2017   Procedure: VIDEO ASSISTED THORACOSCOPY;  Surgeon: Hulda Marin, MD;  Location: [facility] ORS;  Service: Thoracic;  Laterality: Left;  with biopsies   VIDEO BRONCHOSCOPY  09/10/2017   Procedure: VIDEO BRONCHOSCOPY;  Surgeon: Hulda Marin, MD;  Location: [facility] ORS;  Service: Thoracic;;    Social History   Socioeconomic History   Marital status: Married    Spouse name: evert   Number of children: 4   Years of education: Not on file   Highest education level: 9th grade  Occupational History   Not on file  Tobacco Use   Smoking status: Never   Smokeless tobacco: Never  Vaping Use   Vaping status: Never Used  Substance and Sexual Activity   Alcohol use: No    Alcohol/week: 0.0 standard drinks of alcohol   Drug use: No   Sexual activity: Not Currently  Other Topics Concern   Not on file  Social History Narrative   Not on file   Social Determinants of Health   Financial Resource Strain: Medium Risk (09/24/2017)   Overall Financial Resource Strain (CARDIA)    Difficulty of Paying Living Expenses: Somewhat hard  Food Insecurity: Food Insecurity Present (09/24/2017)   Hunger Vital Sign    Worried About Running Out of Food in the Last Year: Often true    Ran Out of Food in the Last Year: Often true  Transportation Needs: No Transportation Needs (09/24/2017)   PRAPARE - Administrator, Civil Service (Medical): No    Lack of Transportation (Non-Medical): No  Physical Activity: Inactive (09/24/2017)   Exercise Vital Sign    Days of Exercise per Week: 0 days    Minutes of Exercise per Session: 0 min  Stress: No Stress Concern Present (09/24/2017)   [employer] of Occupational Health -  Occupational Stress Questionnaire    Feeling of Stress : Not at all  Social Connections: Unknown (09/24/2017)   Social Connection and Isolation Panel [NHANES]    Frequency of Communication with Friends and Family: Not on file    Frequency of Social Gatherings with Friends and Family: Not on file    Attends Religious Services: More than 4 times per year    Active Member of [employer] or Organizations: No    Attends Banker Meetings: Never    Marital Status: Married  Catering manager Violence: Not At Risk (09/24/2017)   Humiliation, Afraid, Rape, and Kick questionnaire    Fear of Current or Ex-Partner: No    Emotionally Abused: No    Physically Abused: No    Sexually Abused: No    Family History  Problem Relation Age of Onset   Dementia Mother    Dementia Sister    Diabetes Sister    Heart attack Brother 70   COPD Brother    Lung cancer Brother        hx smoking   Colon cancer Brother    Liver cancer Brother    COPD Brother    Lung cancer Brother        hx smoking   Colon cancer Maternal Grandfather        dx >.50   Breast cancer Neg Hx      Current Outpatient Medications:    acetaminophen (TYLENOL) 325 MG tablet, Take 650 mg by mouth every 4 (four) hours as needed. For pain / increased temp.  May be administered orally,  per G-Tube if needed or rectally if unable to swallow (separate order).  Maximum dose for 24 hours is 3,000 mg from all sources of Acetaminophen / Tylenol, Disp: , Rfl:    atorvastatin (LIPITOR) 40 MG tablet, Take 1 tablet (40 mg total) by mouth at bedtime., Disp: 30 tablet, Rfl: 0   Baclofen 5 MG TABS, Take 1 tablet by mouth 2 (two) times daily., Disp: , Rfl:    busPIRone (BUSPAR) 10 MG tablet, Take 1 tablet (10 mg total) by mouth 2 (two) times daily., Disp: 180 tablet, Rfl: 1   cholecalciferol (VITAMIN D) 1000 units tablet, Take 1,000 Units by mouth daily., Disp: , Rfl:    DULoxetine (CYMBALTA) 60 MG capsule, Take 1 capsule (60 mg total) by mouth  every morning., Disp: 90 capsule, Rfl: 0   FLUAD QUADRIVALENT 0.5 ML injection, , Disp: , Rfl:    gabapentin (NEURONTIN) 300 MG capsule, Take 300 mg by mouth 2 (two) times daily., Disp: , Rfl:    HYDROcodone-acetaminophen (  NORCO/VICODIN) 5-325 MG tablet, Take by mouth., Disp: , Rfl:    letrozole (FEMARA) 2.5 MG tablet, Take 1 tablet (2.5 mg total) by mouth daily., Disp: 90 tablet, Rfl: 1   mirabegron ER (MYRBETRIQ) 50 MG TB24 tablet, Take 1 tablet (50 mg total) by mouth daily., Disp: 30 tablet, Rfl: 11   MODERNA COVID-19 BIVAL BOOSTER 50 MCG/0.5ML injection, , Disp: , Rfl:    MODERNA COVID-19 VACCINE 100 MCG/0.5ML injection, , Disp: , Rfl:    ondansetron (ZOFRAN) 4 MG tablet, Take 1 tablet (4 mg total) by mouth every 6 (six) hours as needed for nausea or vomiting., Disp: 30 tablet, Rfl: 1   pantoprazole (PROTONIX) 40 MG tablet, Take 1 tablet (40 mg total) by mouth 2 (two) times daily., Disp: 60 tablet, Rfl: 0   traZODone (DESYREL) 100 MG tablet, TAKE 1 TABLET BY MOUTH EVERYDAY AT BEDTIME, Disp: 90 tablet, Rfl: 0   triamcinolone cream (KENALOG) 0.1 %, SMARTSIG:1 Application Topical 2-3 Times Daily, Disp: , Rfl:    vitamin B-12 (CYANOCOBALAMIN) 500 MCG tablet, Take 500 mcg by mouth daily. , Disp: , Rfl:    levothyroxine (SYNTHROID) 100 MCG tablet, Take 1 tablet by mouth daily., Disp: , Rfl:   Physical exam:  Vitals:   10/20/22 1035  BP: 124/70  Pulse: 74  Resp: 18  Temp: (!) 95.4 F (35.2 C)  TempSrc: Tympanic  Weight: 163 lb 6.4 oz (74.1 kg)  Height: 5\' 5"  (1.651 m)   Physical Exam Cardiovascular:     Rate and Rhythm: Normal rate and regular rhythm.     Heart sounds: Normal heart sounds.  Pulmonary:     Effort: Pulmonary effort is normal.     Breath sounds: Normal breath sounds.  Abdominal:     General: Bowel sounds are normal.     Palpations: Abdomen is soft.  Skin:    General: Skin is warm and dry.  Neurological:     Mental Status: She is alert and oriented to person, place,  and time.         Latest Ref Rng & Units 08/04/2022   12:45 PM  CMP  Glucose 70 - 99 mg/dL 89   BUN 8 - 23 mg/dL 10   Creatinine 6.44 - 1.00 mg/dL 0.34   Sodium 742 - 595 mmol/L 139   Potassium 3.5 - 5.1 mmol/L 3.7   Chloride 98 - 111 mmol/L 105   CO2 22 - 32 mmol/L 26   Calcium 8.9 - 10.3 mg/dL 8.7   Total Protein 6.5 - 8.1 g/dL 7.3   Total Bilirubin 0.3 - 1.2 mg/dL 0.9   Alkaline Phos 38 - 126 U/L 61   AST 15 - 41 U/L 24   ALT 0 - 44 U/L 15       Latest Ref Rng & Units 08/04/2022   12:45 PM  CBC  WBC 4.0 - 10.5 K/uL 7.2   Hemoglobin 12.0 - 15.0 g/dL 63.8   Hematocrit 75.6 - 46.0 % 39.1   Platelets 150 - 400 K/uL 226     No images are attached to the encounter.  US Venous Img Lower Unilateral Left (DVT)  Result Date: 10/11/2022 CLINICAL DATA:  Left lower extremity edema. EXAM: LEFT LOWER EXTREMITY VENOUS DOPPLER ULTRASOUND TECHNIQUE: Gray-scale sonography with graded compression, as well as color Doppler and duplex ultrasound were performed to evaluate the lower extremity deep venous systems from the level of the common femoral vein and including the common femoral, femoral, profunda femoral, popliteal and  calf veins including the posterior tibial, peroneal and gastrocnemius veins when visible. The superficial great saphenous vein was also interrogated. Spectral Doppler was utilized to evaluate flow at rest and with distal augmentation maneuvers in the common femoral, femoral and popliteal veins. COMPARISON:  03/21/2018 FINDINGS: Contralateral Common Femoral Vein: Respiratory phasicity is normal and symmetric with the symptomatic side. No evidence of thrombus. Normal compressibility. Common Femoral Vein: No evidence of thrombus. Normal compressibility, respiratory phasicity and response to augmentation. Saphenofemoral Junction: No evidence of thrombus. Normal compressibility and flow on color Doppler imaging. Profunda Femoral Vein: No evidence of thrombus. Normal compressibility  and flow on color Doppler imaging. Femoral Vein: No evidence of thrombus. Normal compressibility, respiratory phasicity and response to augmentation. Popliteal Vein: No evidence of thrombus. Normal compressibility, respiratory phasicity and response to augmentation. Calf Veins: No evidence of thrombus. Normal compressibility and flow on color Doppler imaging. Superficial Great Saphenous Vein: No evidence of thrombus. Normal compressibility. Venous Reflux:  None. Other Findings: No evidence of superficial thrombophlebitis or abnormal fluid collection. IMPRESSION: No evidence of left lower extremity deep venous thrombosis. Electronically Signed   By: Irish Lack M.D.   On: 10/11/2022 11:54     Assessment and plan- Patient is a 82 y.o. female with history of low-volume metastatic ER positive breast cancer with mets to pleura and lymph nodes currently on letrozole here for routine follow-up  Clinically patient is doing well and tolerating letrozole well without any significant side effects.  She has baseline osteopenia in her AP spine but does not wish to take any Reclast at this time.  She is also concerned about side effects from Fosamax and does not want to try that as well.  Will continue to monitor her bone density scan next year without any additional intervention other than calcium and vitamin D.  I will see her back in 6 months with scans prior.  Constipation: I am sending her a prescription for as needed lactulose which she can use if over-the-counter bowel medications are not helping.  However if she does not have a good bowel movement with lactulose or experiences side effects she should get in touch with her primary care doctor   Visit Diagnosis 1. Malignant neoplasm of female breast, unspecified estrogen receptor status, unspecified laterality, unspecified site of breast (HCC)   2. Use of letrozole (Femara)   3. Osteopenia of lumbar spine      Dr. Owens Shark, MD, MPH [facility] [employer] [facility] [phone] 10/20/2022 4:25 PM

## 2022-11-15 ENCOUNTER — Encounter: Payer: Self-pay | Admitting: Psychiatry

## 2022-11-15 ENCOUNTER — Ambulatory Visit: Payer: PPO | Admitting: Psychiatry

## 2022-11-15 VITALS — BP 147/78 | HR 76 | Temp 98.0°F | Ht 65.0 in | Wt 164.0 lb

## 2022-11-15 DIAGNOSIS — F4310 Post-traumatic stress disorder, unspecified: Secondary | ICD-10-CM

## 2022-11-15 DIAGNOSIS — G3184 Mild cognitive impairment, so stated: Secondary | ICD-10-CM | POA: Diagnosis not present

## 2022-11-15 DIAGNOSIS — F330 Major depressive disorder, recurrent, mild: Secondary | ICD-10-CM

## 2022-11-15 DIAGNOSIS — F3342 Major depressive disorder, recurrent, in full remission: Secondary | ICD-10-CM | POA: Diagnosis not present

## 2022-11-15 DIAGNOSIS — F5101 Primary insomnia: Secondary | ICD-10-CM | POA: Diagnosis not present

## 2022-11-15 MED ORDER — DULOXETINE HCL 60 MG PO CPEP: 60.0000 mg | ORAL_CAPSULE | Freq: Every morning | ORAL | 3 refills | Status: DC

## 2022-11-15 MED ORDER — TRAZODONE HCL 100 MG PO TABS: ORAL_TABLET | ORAL | 3 refills | Status: DC

## 2022-11-15 NOTE — Progress Notes (Unsigned)
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  Patient today reports she is currently doing fairly well with regards to her mood symptoms.  She does have chronic pain from arthritis which does take a toll on her day-to-day life.  She however reports she takes it 1 day at a time.  She has good support system from her spouse.  Patient reports they do chores around the house including cooking together and that helps.  Patient reports she is currently under the care of pain management and that does help with controlling her pain.  Patient reports sleep is overall good on the trazodone.  She is currently compliant on the Cymbalta, BuSpar, denies side effects.  Patient denies any suicidality, homicidality or perceptual disturbances.  Patient appeared to be alert, oriented to person place time situation, 3 word memory immediate 3 out of 3, after 5 minutes 3 out of 3.  Patient was able to spell the word 'WORLD' forward but not backward.  Patient denies any other concerns today.  Visit Diagnosis:    ICD-10-CM   1. MDD (major depressive disorder), recurrent, in full remission (HCC)  F33.42 traZODone (DESYREL) 100 MG tablet    DULoxetine (CYMBALTA) 60 MG capsule    2. Post traumatic stress disorder (PTSD)  F43.10 DULoxetine (CYMBALTA) 60 MG capsule    3. Primary insomnia  F51.01     4. Mild cognitive impairment  G31.84       Past  Psychiatric History: I have reviewed past psychiatric history from progress note on 04/11/2021.  Past Medical History:  Past Medical History:  Diagnosis Date   Anxiety    Atrial fibrillation (HCC)    Breast cancer (HCC)    Breast cancer, right (HCC) 1999   RT MASTECTOMY and chemo tx's.    DDD (degenerative disc disease), cervical    hands and back with arthritis   Depression    Diverticulosis    Diverticulosis    DVT of lower extremity (deep venous thrombosis) (HCC)    Dyspnea    Family history of colon cancer    Family history of lung cancer    Family history of throat cancer    GERD (gastroesophageal reflux disease)    Goiter    Headache    History of chemotherapy 2000   BREAST CA   Hypertension    Incontinence    Neuromuscular disorder (HCC)    peipheral neuropathy   OSA (obstructive sleep apnea)    Osteoporosis    Personal history of chemotherapy 1999   BREAST CA   Status post chemotherapy    2000 right breast cancer   Thyroid disease    UTI (urinary tract infection)    Vertigo     Past Surgical History:  Procedure Laterality Date   ABDOMINAL HYSTERECTOMY     BREAST BIOPSY Left 2006   negative   BUNIONECTOMY Bilateral    Screws implanted.   CHOLECYSTECTOMY     COCHLEAR IMPLANT  Right    COLONOSCOPY WITH PROPOFOL N/A 07/24/2016   Procedure: COLONOSCOPY WITH PROPOFOL;  Surgeon: Scot Jun, MD;  Location: [facility] ENDOSCOPY;  Service: Endoscopy;  Laterality: N/A;   ESOPHAGOGASTRODUODENOSCOPY (EGD) WITH PROPOFOL N/A 04/12/2016   Procedure: ESOPHAGOGASTRODUODENOSCOPY (EGD) WITH PROPOFOL;  Surgeon: Scot Jun, MD;  Location: [facility] ENDOSCOPY;  Service: Endoscopy;  Laterality: N/A;   ESOPHAGOGASTRODUODENOSCOPY (EGD) WITH PROPOFOL N/A 05/03/2022   Procedure: ESOPHAGOGASTRODUODENOSCOPY (EGD) WITH PROPOFOL;  Surgeon: Toledo, Boykin Nearing, MD;  Location: [facility] ENDOSCOPY;  Service: Gastroenterology;  Laterality: N/A;   EYE SURGERY Bilateral    cataract   FOOT SURGERY  Bilateral    HAND SURGERY Right    KYPHOPLASTY N/A 05/20/2020   Procedure: L3 KYPHOPLASTY;  Surgeon: Kennedy Bucker, MD;  Location: [facility] ORS;  Service: Orthopedics;  Laterality: N/A;   MASTECTOMY Right 1999   BREAST CA   MASTECTOMY Right 1999   PARTIAL COLECTOMY     THYROIDECTOMY     TOTAL HIP ARTHROPLASTY Left 03/19/2018   Procedure: TOTAL HIP ARTHROPLASTY LEFT;  Surgeon: Kennedy Bucker, MD;  Location: [facility] ORS;  Service: Orthopedics;  Laterality: Left;   VIDEO ASSISTED THORACOSCOPY Left 09/10/2017   Procedure: VIDEO ASSISTED THORACOSCOPY;  Surgeon: Hulda Marin, MD;  Location: [facility] ORS;  Service: Thoracic;  Laterality: Left;  with biopsies   VIDEO BRONCHOSCOPY  09/10/2017   Procedure: VIDEO BRONCHOSCOPY;  Surgeon: Hulda Marin, MD;  Location: [facility] ORS;  Service: Thoracic;;    Family Psychiatric History: I have reviewed family psychiatric history from progress note on 04/11/2021.  Family History:  Family History  Problem Relation Age of Onset   Dementia Mother    Dementia Sister    Diabetes Sister    Heart attack Brother 32   COPD Brother    Lung cancer Brother        hx smoking   Colon cancer Brother    Liver cancer Brother    COPD Brother    Lung cancer Brother        hx smoking   Colon cancer Maternal Grandfather        dx >.50   Breast cancer Neg Hx     Social History: I have reviewed social history from progress note on 04/11/2021. Social History   Socioeconomic History   Marital status: Married    Spouse name: evert   Number of children: 4   Years of education: Not on file   Highest education level: 9th grade  Occupational History   Not on file  Tobacco Use   Smoking status: Never   Smokeless tobacco: Never  Vaping Use   Vaping status: Never Used  Substance and Sexual Activity   Alcohol use: No    Alcohol/week: 0.0 standard drinks of alcohol   Drug use: No   Sexual activity: Not Currently  Other Topics Concern   Not on file  Social History Narrative   Not  on file   Social Determinants of Health   Financial Resource Strain: Medium Risk (09/24/2017)   Overall Financial Resource Strain (CARDIA)    Difficulty of Paying Living Expenses: Somewhat hard  Food Insecurity: Food Insecurity Present (09/24/2017)   Hunger Vital Sign    Worried About Running Out of Food in the Last Year: Often true    Ran Out of Food in the Last Year: Often true  Transportation Needs: No Transportation Needs (09/24/2017)   PRAPARE - Administrator, Civil Service (Medical): No    Lack of Transportation (Non-Medical):  No  Physical Activity: Inactive (09/24/2017)   Exercise Vital Sign    Days of Exercise per Week: 0 days    Minutes of Exercise per Session: 0 min  Stress: No Stress Concern Present (09/24/2017)   [employer] of Occupational Health - Occupational Stress Questionnaire    Feeling of Stress : Not at all  Social Connections: Unknown (09/24/2017)   Social Connection and Isolation Panel [NHANES]    Frequency of Communication with Friends and Family: Not on file    Frequency of Social Gatherings with Friends and Family: Not on file    Attends Religious Services: More than 4 times per year    Active Member of [employer] or Organizations: No    Attends Banker Meetings: Never    Marital Status: Married    Allergies:  Allergies  Allergen Reactions   Imipramine Tinitus   Latex Rash   Penicillins Rash   Tape Rash    Metabolic Disorder Labs: No results found for: "HGBA1C", "MPG" No results found for: "PROLACTIN" No results found for: "CHOL", "TRIG", "HDL", "CHOLHDL", "VLDL", "LDLCALC" Lab Results  Component Value Date   TSH 0.275 (L) 11/09/2012   TSH 0.443 (L) 10/12/2012    Therapeutic Level Labs: No results found for: "LITHIUM" No results found for: "VALPROATE" No results found for: "CBMZ"  Current Medications: Current Outpatient Medications  Medication Sig Dispense Refill   acetaminophen (TYLENOL) 325 MG tablet Take 650  mg by mouth every 4 (four) hours as needed. For pain / increased temp.  May be administered orally,  per G-Tube if needed or rectally if unable to swallow (separate order).  Maximum dose for 24 hours is 3,000 mg from all sources of Acetaminophen / Tylenol     atorvastatin (LIPITOR) 40 MG tablet Take 1 tablet (40 mg total) by mouth at bedtime. 30 tablet 0   Baclofen 5 MG TABS Take 1 tablet by mouth 2 (two) times daily.     busPIRone (BUSPAR) 10 MG tablet Take 1 tablet (10 mg total) by mouth 2 (two) times daily. 180 tablet 1   cholecalciferol (VITAMIN D) 1000 units tablet Take 1,000 Units by mouth daily.     FLUAD QUADRIVALENT 0.5 ML injection      gabapentin (NEURONTIN) 300 MG capsule Take 300 mg by mouth 2 (two) times daily.     HYDROcodone-acetaminophen (NORCO/VICODIN) 5-325 MG tablet Take by mouth. Takes 7.5-325 mg     Lactulose 20 GM/30ML SOLN Take 15 mLs (10 g total) by mouth every 12 (twelve) hours as needed. 450 mL 0   letrozole (FEMARA) 2.5 MG tablet Take 1 tablet (2.5 mg total) by mouth daily. 90 tablet 1   mirabegron ER (MYRBETRIQ) 50 MG TB24 tablet Take 1 tablet (50 mg total) by mouth daily. 30 tablet 11   MODERNA COVID-19 BIVAL BOOSTER 50 MCG/0.5ML injection      MODERNA COVID-19 VACCINE 100 MCG/0.5ML injection      ondansetron (ZOFRAN) 4 MG tablet Take 1 tablet (4 mg total) by mouth every 6 (six) hours as needed for nausea or vomiting. 30 tablet 1   pantoprazole (PROTONIX) 40 MG tablet Take 1 tablet (40 mg total) by mouth 2 (two) times daily. 60 tablet 0   triamcinolone cream (KENALOG) 0.1 % SMARTSIG:1 Application Topical 2-3 Times Daily     vitamin B-12 (CYANOCOBALAMIN) 500 MCG tablet Take 500 mcg by mouth daily.      [START ON 12/25/2022] DULoxetine (CYMBALTA) 60 MG capsule Take 1 capsule (60 mg total)  by mouth every morning. 90 capsule 3   levothyroxine (SYNTHROID) 100 MCG tablet Take 1 tablet by mouth daily.     traZODone (DESYREL) 100 MG tablet TAKE 1 TABLET BY MOUTH EVERYDAY AT  BEDTIME 90 tablet 3   No current facility-administered medications for this visit.     Musculoskeletal: Strength & Muscle Tone: within normal limits Gait & Station:  slow, walks with cane Patient leans: N/A  Psychiatric Specialty Exam: Review of Systems  Psychiatric/Behavioral: Negative.      Blood pressure (!) 147/78, pulse 76, temperature 98 F (36.7 C), temperature source Skin, height 5\' 5"  (1.651 m), weight 164 lb (74.4 kg).Body mass index is 27.29 kg/m.  General Appearance: Fairly Groomed  Eye Contact:  Fair  Speech:  Clear and Coherent  Volume:  Normal  Mood:  Euthymic  Affect:  Congruent  Thought Process:  Goal Directed and Descriptions of Associations: Intact  Orientation:  Full (Time, Place, and Person)  Thought Content: Logical   Suicidal Thoughts:  No  Homicidal Thoughts:  No  Memory:  Immediate;   Fair Recent;   Fair Remote;   Fair  Judgement:  Fair  Insight:  Fair  Psychomotor Activity:  Normal  Concentration:  Concentration: Fair and Attention Span: Fair  Recall:  [employer] of Knowledge: Fair  Language: Fair  Akathisia:  No  Handed:  Right  AIMS (if indicated): not done  Assets:  Communication Skills Desire for Improvement Housing Social Support  ADL's:  Intact  Cognition: WNL  Sleep:  Fair   Screenings: GAD-7    Flowsheet Row Office Visit from 11/15/2022 in [facility] Office Visit from 06/12/2022 in [facility]  Total GAD-7 Score 2 6      PHQ2-9    Flowsheet Row Office Visit from 11/15/2022 in [facility] Office Visit from 06/12/2022 in [facility] Office Visit from 11/16/2021 in [facility] Office Visit from 04/11/2021 in [facility]  PHQ-2 Total Score 0 0 0 2  PHQ-9 Total Score -- 1 5 4       Flowsheet  Row Office Visit from 11/15/2022 in [facility] ED from 08/04/2022 in [facility] Visit from 06/12/2022 in [facility]  C-SSRS RISK CATEGORY No Risk No Risk No Risk        Assessment and Plan: HAILEA MCCLARIN is a 82 year old Caucasian female, married, on SSI, lives in [place] with her husband, has a history of MDD, PTSD, insomnia, multiple other medical problems was evaluated in office today.  Patient is currently stable, plan as noted below.  Plan MDD in remission Cymbalta 60 mg p.o. daily  PTSD-stable Cymbalta 60 mg p.o. daily BuSpar 10 mg p.o. twice daily  Insomnia-stable Trazodone 100 mg p.o. nightly Continue sleep hygiene techniques Will need sufficient pain management as well  Mild cognitive impairment-chronic-patient today appeared to be alert, bedside Mini-Mental Status exam-patient did well as noted above. Will consider repeating MMSE/slums in future sessions.  I have reviewed labs including sodium level-05/23/2022-within normal limits, platelet count-08/04/2022-within normal limits, TSH to-05/23/2022-within normal limits.   Follow-up in clinic in 6 months or sooner if needed.   Consent: Patient/Guardian gives verbal consent for treatment and assignment of benefits for services provided during this visit. Patient/Guardian expressed understanding and agreed to proceed.   This  note was generated in part or whole with voice recognition software. Voice recognition is usually quite accurate but there are transcription errors that can and very often do occur. I apologize for any typographical errors that were not detected and corrected.    Jomarie Longs, MD 11/16/2022, 9:18 AM

## 2022-11-27 DIAGNOSIS — K219 Gastro-esophageal reflux disease without esophagitis: Secondary | ICD-10-CM | POA: Diagnosis not present

## 2022-11-27 DIAGNOSIS — R7303 Prediabetes: Secondary | ICD-10-CM | POA: Diagnosis not present

## 2022-11-27 DIAGNOSIS — I10 Essential (primary) hypertension: Secondary | ICD-10-CM | POA: Diagnosis not present

## 2022-11-27 DIAGNOSIS — E785 Hyperlipidemia, unspecified: Secondary | ICD-10-CM | POA: Diagnosis not present

## 2022-11-27 DIAGNOSIS — M5136 Other intervertebral disc degeneration, lumbar region: Secondary | ICD-10-CM | POA: Diagnosis not present

## 2022-11-29 ENCOUNTER — Other Ambulatory Visit: Payer: Self-pay | Admitting: *Deleted

## 2022-11-29 MED ORDER — LETROZOLE 2.5 MG PO TABS: 2.5000 mg | ORAL_TABLET | Freq: Every day | ORAL | 1 refills | Status: DC

## 2022-12-04 ENCOUNTER — Other Ambulatory Visit (INDEPENDENT_AMBULATORY_CARE_PROVIDER_SITE_OTHER): Payer: Self-pay | Admitting: Nurse Practitioner

## 2022-12-04 DIAGNOSIS — R7303 Prediabetes: Secondary | ICD-10-CM | POA: Diagnosis not present

## 2022-12-04 DIAGNOSIS — C7802 Secondary malignant neoplasm of left lung: Secondary | ICD-10-CM | POA: Diagnosis not present

## 2022-12-04 DIAGNOSIS — I10 Essential (primary) hypertension: Secondary | ICD-10-CM | POA: Diagnosis not present

## 2022-12-04 DIAGNOSIS — E785 Hyperlipidemia, unspecified: Secondary | ICD-10-CM | POA: Diagnosis not present

## 2022-12-04 DIAGNOSIS — K219 Gastro-esophageal reflux disease without esophagitis: Secondary | ICD-10-CM | POA: Diagnosis not present

## 2022-12-04 DIAGNOSIS — F3342 Major depressive disorder, recurrent, in full remission: Secondary | ICD-10-CM | POA: Diagnosis not present

## 2022-12-04 DIAGNOSIS — I83812 Varicose veins of left lower extremities with pain: Secondary | ICD-10-CM

## 2022-12-04 DIAGNOSIS — M5136 Other intervertebral disc degeneration, lumbar region: Secondary | ICD-10-CM | POA: Diagnosis not present

## 2022-12-04 DIAGNOSIS — I700 Atherosclerosis of aorta: Secondary | ICD-10-CM | POA: Diagnosis not present

## 2022-12-04 DIAGNOSIS — Z0000 Encounter for general adult medical examination without abnormal findings: Secondary | ICD-10-CM | POA: Diagnosis not present

## 2022-12-04 DIAGNOSIS — E034 Atrophy of thyroid (acquired): Secondary | ICD-10-CM | POA: Diagnosis not present

## 2022-12-04 DIAGNOSIS — Z23 Encounter for immunization: Secondary | ICD-10-CM | POA: Diagnosis not present

## 2022-12-04 DIAGNOSIS — C50919 Malignant neoplasm of unspecified site of unspecified female breast: Secondary | ICD-10-CM | POA: Diagnosis not present

## 2022-12-06 ENCOUNTER — Ambulatory Visit (INDEPENDENT_AMBULATORY_CARE_PROVIDER_SITE_OTHER): Payer: PPO | Admitting: Nurse Practitioner

## 2022-12-06 ENCOUNTER — Encounter (INDEPENDENT_AMBULATORY_CARE_PROVIDER_SITE_OTHER): Payer: Self-pay | Admitting: Nurse Practitioner

## 2022-12-06 ENCOUNTER — Ambulatory Visit (INDEPENDENT_AMBULATORY_CARE_PROVIDER_SITE_OTHER): Payer: PPO

## 2022-12-06 VITALS — BP 123/71 | HR 80 | Resp 16 | Wt 163.8 lb

## 2022-12-06 DIAGNOSIS — G629 Polyneuropathy, unspecified: Secondary | ICD-10-CM | POA: Diagnosis not present

## 2022-12-06 DIAGNOSIS — I10 Essential (primary) hypertension: Secondary | ICD-10-CM | POA: Diagnosis not present

## 2022-12-06 DIAGNOSIS — M5416 Radiculopathy, lumbar region: Secondary | ICD-10-CM

## 2022-12-06 DIAGNOSIS — I83893 Varicose veins of bilateral lower extremities with other complications: Secondary | ICD-10-CM | POA: Diagnosis not present

## 2022-12-06 DIAGNOSIS — I83812 Varicose veins of left lower extremities with pain: Secondary | ICD-10-CM

## 2022-12-06 NOTE — Progress Notes (Signed)
Subjective:    Patient ID: Breanna Zhang, female    DOB: November 22, 1940, 82 y.o.   MRN: [id number] Chief Complaint  Patient presents with   New Patient (Initial Visit)    Ref Graciela Husbands consult lle varicose with pain    The patient is an 82 year old female who presents today for evaluation of varicose veins with leg pain.  She is a somewhat difficult historian but she notes that the pain in her legs is ongoing daily and is stopping her from completing her everyday activities.  She notes that when she stands her legs swell and she has a varicosity on the right lower extremity that bulges it is very painful for her.  She has a smaller 1 on the left but really the right lower extremity is what gives her the majority of difficulty.  She notes that her legs burn and staying and it is more of an allover pain as well.  The patient was diagnosed with neuropathy in 2020 by Dr. Sherryll Burger.  She had an extensive history of chemotherapy.  She also has some known chronic pain issues in her left lower extremity due to lumbar radiculitis.  She has previously had a history of vein stripping in her right lower extremity and she notes that she believes it was done on both.  Currently there are no open wounds or ulcerations.  She also endorses having pain and tenderness in the varicosities after she stands and they are uncomfortable and throbbing.  The noninvasive study showed no evidence of DVT or superficial thrombophlebitis bilaterally.  In the right lower extremity there is deep venous insufficiency in addition it shows stripping of the great saphenous vein.  The left lower extremity has no evidence of chronic venous insufficiency however there is a small area of reflux in the calf area.  She has tried engaging in conservative therapy and despite this she continues to have pain and discomfort.    Review of Systems  Cardiovascular:  Positive for leg swelling.  Musculoskeletal:  Positive for arthralgias, back pain and gait  problem.  All other systems reviewed and are negative.      Objective:   Physical Exam Vitals reviewed.  HENT:     Head: Normocephalic.  Cardiovascular:     Rate and Rhythm: Normal rate.     Pulses:          Dorsalis pedis pulses are 1+ on the right side and 1+ on the left side.       Posterior tibial pulses are 1+ on the right side and 1+ on the left side.  Pulmonary:     Effort: Pulmonary effort is normal.  Skin:    General: Skin is warm and dry.     Comments: Large palpable varicosities of the right lower extremity  Neurological:     Mental Status: She is alert and oriented to person, place, and time.  Psychiatric:        Mood and Affect: Mood normal.        Behavior: Behavior normal.        Thought Content: Thought content normal.        Judgment: Judgment normal.     BP 123/71 (BP Location: Left Arm)   Pulse 80   Resp 16   Wt 163 lb 12.8 oz (74.3 kg)   BMI 27.26 kg/m   Past Medical History:  Diagnosis Date   Anxiety    Atrial fibrillation (HCC)    Breast cancer (HCC)  Breast cancer, right (HCC) 1999   RT MASTECTOMY and chemo tx's.    DDD (degenerative disc disease), cervical    hands and back with arthritis   Depression    Diverticulosis    Diverticulosis    DVT of lower extremity (deep venous thrombosis) (HCC)    Dyspnea    Family history of colon cancer    Family history of lung cancer    Family history of throat cancer    GERD (gastroesophageal reflux disease)    Goiter    Headache    History of chemotherapy 2000   BREAST CA   Hypertension    Incontinence    Neuromuscular disorder (HCC)    peipheral neuropathy   OSA (obstructive sleep apnea)    Osteoporosis    Personal history of chemotherapy 1999   BREAST CA   Status post chemotherapy    2000 right breast cancer   Thyroid disease    UTI (urinary tract infection)    Vertigo     Social History   Socioeconomic History   Marital status: Married    Spouse name: evert   Number of  children: 4   Years of education: Not on file   Highest education level: 9th grade  Occupational History   Not on file  Tobacco Use   Smoking status: Never   Smokeless tobacco: Never  Vaping Use   Vaping status: Never Used  Substance and Sexual Activity   Alcohol use: No    Alcohol/week: 0.0 standard drinks of alcohol   Drug use: No   Sexual activity: Not Currently  Other Topics Concern   Not on file  Social History Narrative   Not on file   Social Determinants of Health   Financial Resource Strain: Low Risk  (12/04/2022)   Received from [facility]   Overall Financial Resource Strain (CARDIA)    Difficulty of Paying Living Expenses: Not hard at all  Food Insecurity: No Food Insecurity (12/04/2022)   Received from [facility]   Hunger Vital Sign    Worried About Running Out of Food in the Last Year: Never true    Ran Out of Food in the Last Year: Never true  Transportation Needs: No Transportation Needs (12/04/2022)   Received from [facility] - Transportation    In the past 12 months, has lack of transportation kept you from medical appointments or from getting medications?: No    Lack of Transportation (Non-Medical): No  Physical Activity: Inactive (09/24/2017)   Exercise Vital Sign    Days of Exercise per Week: 0 days    Minutes of Exercise per Session: 0 min  Stress: No Stress Concern Present (09/24/2017)   [employer] of Occupational Health - Occupational Stress Questionnaire    Feeling of Stress : Not at all  Social Connections: Unknown (09/24/2017)   Social Connection and Isolation Panel [NHANES]    Frequency of Communication with Friends and Family: Not on file    Frequency of Social Gatherings with Friends and Family: Not on file    Attends Religious Services: More than 4 times per year    Active Member of [employer] or Organizations: No    Attends Banker Meetings: Never    Marital  Status: Married  Catering manager Violence: Not At Risk (09/24/2017)   Humiliation, Afraid, Rape, and Kick questionnaire    Fear of Current or Ex-Partner: No    Emotionally Abused: No  Physically Abused: No    Sexually Abused: No    Past Surgical History:  Procedure Laterality Date   ABDOMINAL HYSTERECTOMY     BREAST BIOPSY Left 2006   negative   BUNIONECTOMY Bilateral    Screws implanted.   CHOLECYSTECTOMY     COCHLEAR IMPLANT Right    COLONOSCOPY WITH PROPOFOL N/A 07/24/2016   Procedure: COLONOSCOPY WITH PROPOFOL;  Surgeon: Scot Jun, MD;  Location: [facility] ENDOSCOPY;  Service: Endoscopy;  Laterality: N/A;   ESOPHAGOGASTRODUODENOSCOPY (EGD) WITH PROPOFOL N/A 04/12/2016   Procedure: ESOPHAGOGASTRODUODENOSCOPY (EGD) WITH PROPOFOL;  Surgeon: Scot Jun, MD;  Location: [facility] ENDOSCOPY;  Service: Endoscopy;  Laterality: N/A;   ESOPHAGOGASTRODUODENOSCOPY (EGD) WITH PROPOFOL N/A 05/03/2022   Procedure: ESOPHAGOGASTRODUODENOSCOPY (EGD) WITH PROPOFOL;  Surgeon: Toledo, Boykin Nearing, MD;  Location: [facility] ENDOSCOPY;  Service: Gastroenterology;  Laterality: N/A;   EYE SURGERY Bilateral    cataract   FOOT SURGERY Bilateral    HAND SURGERY Right    KYPHOPLASTY N/A 05/20/2020   Procedure: L3 KYPHOPLASTY;  Surgeon: Kennedy Bucker, MD;  Location: [facility] ORS;  Service: Orthopedics;  Laterality: N/A;   MASTECTOMY Right 1999   BREAST CA   MASTECTOMY Right 1999   PARTIAL COLECTOMY     THYROIDECTOMY     TOTAL HIP ARTHROPLASTY Left 03/19/2018   Procedure: TOTAL HIP ARTHROPLASTY LEFT;  Surgeon: Kennedy Bucker, MD;  Location: [facility] ORS;  Service: Orthopedics;  Laterality: Left;   VIDEO ASSISTED THORACOSCOPY Left 09/10/2017   Procedure: VIDEO ASSISTED THORACOSCOPY;  Surgeon: Hulda Marin, MD;  Location: [facility] ORS;  Service: Thoracic;  Laterality: Left;  with biopsies   VIDEO BRONCHOSCOPY  09/10/2017   Procedure: VIDEO BRONCHOSCOPY;  Surgeon: Hulda Marin, MD;  Location: [facility] ORS;  Service: Thoracic;;     Family History  Problem Relation Age of Onset   Dementia Mother    Dementia Sister    Diabetes Sister    Heart attack Brother 15   COPD Brother    Lung cancer Brother        hx smoking   Colon cancer Brother    Liver cancer Brother    COPD Brother    Lung cancer Brother        hx smoking   Colon cancer Maternal Grandfather        dx >.50   Breast cancer Neg Hx     Allergies  Allergen Reactions   Imipramine Tinitus   Latex Rash   Penicillins Rash   Tape Rash       Latest Ref Rng & Units 08/04/2022   12:45 PM 04/21/2022    9:29 AM 09/29/2021    8:04 AM  CBC  WBC 4.0 - 10.5 K/uL 7.2  5.5  8.1   Hemoglobin 12.0 - 15.0 g/dL 08.6  57.8  46.9   Hematocrit 36.0 - 46.0 % 39.1  36.9  38.6   Platelets 150 - 400 K/uL 226  181  219       CMP     Component Value Date/Time   NA 139 08/04/2022 1245   NA 140 08/04/2013 1532   K 3.7 08/04/2022 1245   K 3.7 08/04/2013 1532   CL 105 08/04/2022 1245   CL 108 (H) 08/04/2013 1532   CO2 26 08/04/2022 1245   CO2 19 (L) 08/04/2013 1532   GLUCOSE 89 08/04/2022 1245   GLUCOSE 131 (H) 08/04/2013 1532   BUN 10 08/04/2022 1245   BUN 13 08/04/2013 1532   CREATININE 0.81 08/04/2022 1245  CREATININE 0.84 08/04/2013 1532   CALCIUM 8.7 (L) 08/04/2022 1245   CALCIUM 8.8 08/04/2013 1532   PROT 7.3 08/04/2022 1245   PROT 7.1 08/04/2013 1532   ALBUMIN 4.1 08/04/2022 1245   ALBUMIN 3.6 08/04/2013 1532   AST 24 08/04/2022 1245   AST 23 08/04/2013 1532   ALT 15 08/04/2022 1245   ALT 21 08/04/2013 1532   ALKPHOS 61 08/04/2022 1245   ALKPHOS 76 08/04/2013 1532   BILITOT 0.9 08/04/2022 1245   BILITOT 0.6 08/04/2013 1532   GFRNONAA >60 08/04/2022 1245   GFRNONAA >60 08/04/2013 1532     No results found.     Assessment & Plan:   1. Varicose veins of bilateral lower extremities with other complications Recommend:  The patient has had successful ablation of the previously incompetent saphenous venous system but still has  persistent symptoms of pain and swelling that are having a negative impact on daily life and daily activities.  Patient should undergo injection foam sclerotherapy to treat the residual varicosities of the right and saline sclerotherapy for treatment of the LLE  The risks, benefits and alternative therapies were reviewed in detail with the patient.  All questions were answered.  The patient agrees to proceed with sclerotherapy at their convenience.   We did also discuss at length that the extremity pain symptoms in some respects are somewhat atypical and that moving forward with sclerotherapy may not eliminate all of the lower extremity pain.  However, I do believe that sclerotherapy would offer significant improvement and therefore is a realistic and viable option.  2. Primary hypertension Continue antihypertensive medications as already ordered, these medications have been reviewed and there are no changes at this time.  3. Polyneuropathy The patient was seen and evaluated in 2020 and diagnosed with moderate to severe polyneuropathy, which I suspect is playing a role in the discomfort she feels in her lower extremities  4. 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   Current Outpatient Medications on File Prior to Visit  Medication Sig Dispense Refill   acetaminophen (TYLENOL) 325 MG tablet Take 650 mg by mouth every 4 (four) hours as needed. For pain / increased temp.  May be administered orally,  per G-Tube if needed or rectally if unable to swallow (separate order).  Maximum dose for 24 hours is 3,000 mg from all sources of Acetaminophen / Tylenol     atorvastatin (LIPITOR) 40 MG tablet Take 1 tablet (40 mg total) by mouth at bedtime. 30 tablet 0   Baclofen 5 MG TABS Take 1 tablet by mouth 2 (two) times daily.     busPIRone (BUSPAR) 10 MG tablet Take 1 tablet  (10 mg total) by mouth 2 (two) times daily. 180 tablet 1   cholecalciferol (VITAMIN D) 1000 units tablet Take 1,000 Units by mouth daily.     [START ON 12/25/2022] DULoxetine (CYMBALTA) 60 MG capsule Take 1 capsule (60 mg total) by mouth every morning. 90 capsule 3   FLUAD QUADRIVALENT 0.5 ML injection      gabapentin (NEURONTIN) 300 MG capsule Take 300 mg by mouth 2 (two) times daily.     HYDROcodone-acetaminophen (NORCO/VICODIN) 5-325 MG tablet Take by mouth. Takes 7.5-325 mg     Lactulose 20 GM/30ML SOLN Take 15 mLs (10 g total) by mouth every 12 (twelve) hours as needed. 450 mL 0   letrozole (  FEMARA) 2.5 MG tablet Take 1 tablet (2.5 mg total) by mouth daily. 90 tablet 1   mirabegron ER (MYRBETRIQ) 50 MG TB24 tablet Take 1 tablet (50 mg total) by mouth daily. 30 tablet 11   MODERNA COVID-19 BIVAL BOOSTER 50 MCG/0.5ML injection      MODERNA COVID-19 VACCINE 100 MCG/0.5ML injection      ondansetron (ZOFRAN) 4 MG tablet Take 1 tablet (4 mg total) by mouth every 6 (six) hours as needed for nausea or vomiting. 30 tablet 1   pantoprazole (PROTONIX) 40 MG tablet Take 1 tablet (40 mg total) by mouth 2 (two) times daily. 60 tablet 0   traZODone (DESYREL) 100 MG tablet TAKE 1 TABLET BY MOUTH EVERYDAY AT BEDTIME 90 tablet 3   triamcinolone cream (KENALOG) 0.1 % SMARTSIG:1 Application Topical 2-3 Times Daily     vitamin B-12 (CYANOCOBALAMIN) 500 MCG tablet Take 500 mcg by mouth daily.      levothyroxine (SYNTHROID) 100 MCG tablet Take 1 tablet by mouth daily.     No current facility-administered medications on file prior to visit.    There are no Patient Instructions on file for this visit. No follow-ups on file.   Georgiana Spinner, NP

## 2022-12-21 DIAGNOSIS — H353290 Exudative age-related macular degeneration, unspecified eye, stage unspecified: Secondary | ICD-10-CM | POA: Diagnosis not present

## 2022-12-21 DIAGNOSIS — G629 Polyneuropathy, unspecified: Secondary | ICD-10-CM | POA: Diagnosis not present

## 2022-12-21 DIAGNOSIS — I739 Peripheral vascular disease, unspecified: Secondary | ICD-10-CM | POA: Diagnosis not present

## 2022-12-21 DIAGNOSIS — I10 Essential (primary) hypertension: Secondary | ICD-10-CM | POA: Diagnosis not present

## 2022-12-21 DIAGNOSIS — Z6823 Body mass index (BMI) 23.0-23.9, adult: Secondary | ICD-10-CM | POA: Diagnosis not present

## 2022-12-25 ENCOUNTER — Telehealth (INDEPENDENT_AMBULATORY_CARE_PROVIDER_SITE_OTHER): Payer: Self-pay

## 2022-12-25 NOTE — Telephone Encounter (Signed)
Attempted to call pt to schedule Sclero appts LVM for pt Breanna Zhang

## 2022-12-26 NOTE — Telephone Encounter (Signed)
Scheduled pt for appts.

## 2023-01-02 DIAGNOSIS — E034 Atrophy of thyroid (acquired): Secondary | ICD-10-CM | POA: Diagnosis not present

## 2023-01-08 ENCOUNTER — Other Ambulatory Visit: Payer: Self-pay

## 2023-01-08 DIAGNOSIS — N390 Urinary tract infection, site not specified: Secondary | ICD-10-CM

## 2023-01-08 MED ORDER — GEMTESA 75 MG PO TABS
75.0000 mg | ORAL_TABLET | Freq: Every day | ORAL | Status: DC
Start: 2023-01-08 — End: 2023-02-13

## 2023-01-10 DIAGNOSIS — M48062 Spinal stenosis, lumbar region with neurogenic claudication: Secondary | ICD-10-CM | POA: Diagnosis not present

## 2023-01-10 DIAGNOSIS — M5416 Radiculopathy, lumbar region: Secondary | ICD-10-CM | POA: Diagnosis not present

## 2023-01-10 DIAGNOSIS — H353211 Exudative age-related macular degeneration, right eye, with active choroidal neovascularization: Secondary | ICD-10-CM | POA: Diagnosis not present

## 2023-01-10 DIAGNOSIS — H353221 Exudative age-related macular degeneration, left eye, with active choroidal neovascularization: Secondary | ICD-10-CM | POA: Diagnosis not present

## 2023-01-11 ENCOUNTER — Ambulatory Visit: Payer: PPO | Admitting: Physician Assistant

## 2023-01-24 ENCOUNTER — Ambulatory Visit: Payer: PPO | Admitting: Physician Assistant

## 2023-02-02 ENCOUNTER — Ambulatory Visit (INDEPENDENT_AMBULATORY_CARE_PROVIDER_SITE_OTHER): Payer: PPO | Admitting: Vascular Surgery

## 2023-02-06 ENCOUNTER — Ambulatory Visit (INDEPENDENT_AMBULATORY_CARE_PROVIDER_SITE_OTHER): Payer: PPO | Admitting: Nurse Practitioner

## 2023-02-13 ENCOUNTER — Encounter: Payer: Self-pay | Admitting: Physician Assistant

## 2023-02-13 ENCOUNTER — Ambulatory Visit: Payer: PPO | Admitting: Physician Assistant

## 2023-02-13 VITALS — BP 147/71 | HR 78 | Ht 65.0 in | Wt 163.0 lb

## 2023-02-13 DIAGNOSIS — N3281 Overactive bladder: Secondary | ICD-10-CM | POA: Diagnosis not present

## 2023-02-13 LAB — BLADDER SCAN AMB NON-IMAGING: Scan Result: 36

## 2023-02-13 MED ORDER — GEMTESA 75 MG PO TABS
75.0000 mg | ORAL_TABLET | Freq: Every day | ORAL | 11 refills | Status: AC
Start: 2023-02-13 — End: ?

## 2023-02-13 NOTE — Progress Notes (Signed)
02/13/2023 4:09 PM   Breanna Zhang 18-Jan-1941 [id number]  CC: Chief Complaint  Patient presents with   Follow-up   HPI: Breanna Zhang is a 82 y.o. female with PMH recurrent UTI, atrophic vaginitis, OAB wet previously on Myrbetriq 50 mg, and metastatic breast cancer s/p right mastectomy and chemotherapy on letrozole who presents today for annual follow-up.   Today she reports she has been using Gemtesa samples for about the past month due to reaching the donut hole this year.  She thinks that prior to starting samples, Myrbetriq had lost efficacy for her and she was continuing to have bothersome urge incontinence, frequency, and nocturia x 3-4.  On Gemtesa, she now has nocturia x 1 and her urge incontinence has resolved.  She is very pleased and wishes to continue the [place].  She denies dysuria or gross hematuria.  She reports occasional urinary hesitancy and malodorous urine, but admits to some recent dietary changes to help with managing her husband's newly diagnosed congestive heart failure.  PVR 36 mL.  PMH: Past Medical History:  Diagnosis Date   Anxiety    Atrial fibrillation (HCC)    Breast cancer (HCC)    Breast cancer, right (HCC) 1999   RT MASTECTOMY and chemo tx's.    DDD (degenerative disc disease), cervical    hands and back with arthritis   Depression    Diverticulosis    Diverticulosis    DVT of lower extremity (deep venous thrombosis) (HCC)    Dyspnea    Family history of colon cancer    Family history of lung cancer    Family history of throat cancer    GERD (gastroesophageal reflux disease)    Goiter    Headache    History of chemotherapy 2000   BREAST CA   Hypertension    Incontinence    Neuromuscular disorder (HCC)    peipheral neuropathy   OSA (obstructive sleep apnea)    Osteoporosis    Personal history of chemotherapy 1999   BREAST CA   Status post chemotherapy    2000 right breast cancer   Thyroid disease    UTI (urinary tract  infection)    Vertigo     Surgical History: Past Surgical History:  Procedure Laterality Date   ABDOMINAL HYSTERECTOMY     BREAST BIOPSY Left 2006   negative   BUNIONECTOMY Bilateral    Screws implanted.   CHOLECYSTECTOMY     COCHLEAR IMPLANT Right    COLONOSCOPY WITH PROPOFOL N/A 07/24/2016   Procedure: COLONOSCOPY WITH PROPOFOL;  Surgeon: Scot Jun, MD;  Location: [facility] ENDOSCOPY;  Service: Endoscopy;  Laterality: N/A;   ESOPHAGOGASTRODUODENOSCOPY (EGD) WITH PROPOFOL N/A 04/12/2016   Procedure: ESOPHAGOGASTRODUODENOSCOPY (EGD) WITH PROPOFOL;  Surgeon: Scot Jun, MD;  Location: [facility] ENDOSCOPY;  Service: Endoscopy;  Laterality: N/A;   ESOPHAGOGASTRODUODENOSCOPY (EGD) WITH PROPOFOL N/A 05/03/2022   Procedure: ESOPHAGOGASTRODUODENOSCOPY (EGD) WITH PROPOFOL;  Surgeon: Toledo, Boykin Nearing, MD;  Location: [facility] ENDOSCOPY;  Service: Gastroenterology;  Laterality: N/A;   EYE SURGERY Bilateral    cataract   FOOT SURGERY Bilateral    HAND SURGERY Right    KYPHOPLASTY N/A 05/20/2020   Procedure: L3 KYPHOPLASTY;  Surgeon: Kennedy Bucker, MD;  Location: [facility] ORS;  Service: Orthopedics;  Laterality: N/A;   MASTECTOMY Right 1999   BREAST CA   MASTECTOMY Right 1999   PARTIAL COLECTOMY     THYROIDECTOMY     TOTAL HIP ARTHROPLASTY Left 03/19/2018   Procedure: TOTAL HIP ARTHROPLASTY  LEFT;  Surgeon: Kennedy Bucker, MD;  Location: [facility] ORS;  Service: Orthopedics;  Laterality: Left;   VIDEO ASSISTED THORACOSCOPY Left 09/10/2017   Procedure: VIDEO ASSISTED THORACOSCOPY;  Surgeon: Hulda Marin, MD;  Location: [facility] ORS;  Service: Thoracic;  Laterality: Left;  with biopsies   VIDEO BRONCHOSCOPY  09/10/2017   Procedure: VIDEO BRONCHOSCOPY;  Surgeon: Hulda Marin, MD;  Location: [facility] ORS;  Service: Thoracic;;    Home Medications:  Allergies as of 02/13/2023       Reactions   Imipramine Tinitus   Latex Rash   Penicillins Rash   Tape Rash        Medication List        Accurate as of  February 13, 2023  4:09 PM. If you have any questions, ask your nurse or doctor.          STOP taking these medications    mirabegron ER 50 MG Tb24 tablet Commonly known as: Myrbetriq Stopped by: Carman Ching       TAKE these medications    acetaminophen 325 MG tablet Commonly known as: TYLENOL Take 650 mg by mouth every 4 (four) hours as needed. For pain / increased temp.  May be administered orally,  per G-Tube if needed or rectally if unable to swallow (separate order).  Maximum dose for 24 hours is 3,000 mg from all sources of Acetaminophen / Tylenol   atorvastatin 40 MG tablet Commonly known as: LIPITOR Take 1 tablet (40 mg total) by mouth at bedtime.   Baclofen 5 MG Tabs Take 1 tablet by mouth 2 (two) times daily.   busPIRone 10 MG tablet Commonly known as: BUSPAR Take 1 tablet (10 mg total) by mouth 2 (two) times daily.   cholecalciferol 1000 units tablet Commonly known as: VITAMIN D Take 1,000 Units by mouth daily.   cyanocobalamin 500 MCG tablet Commonly known as: VITAMIN B12 Take 500 mcg by mouth daily.   DULoxetine 60 MG capsule Commonly known as: CYMBALTA Take 1 capsule (60 mg total) by mouth every morning.   Fluad Quadrivalent 0.5 ML injection Generic drug: influenza vaccine adjuvanted   gabapentin 300 MG capsule Commonly known as: NEURONTIN Take 300 mg by mouth 2 (two) times daily.   Gemtesa 75 MG Tabs Generic drug: Vibegron Take 1 tablet (75 mg total) by mouth daily.   HYDROcodone-acetaminophen 5-325 MG tablet Commonly known as: NORCO/VICODIN Take by mouth. Takes 7.5-325 mg   Lactulose 20 GM/30ML Soln Take 15 mLs (10 g total) by mouth every 12 (twelve) hours as needed.   letrozole 2.5 MG tablet Commonly known as: FEMARA Take 1 tablet (2.5 mg total) by mouth daily.   levothyroxine 100 MCG tablet Commonly known as: SYNTHROID Take 1 tablet by mouth daily.   Moderna COVID-19 Bival Booster 50 MCG/0.5ML injection Generic drug:  COVID-19 mRNA bivalent vaccine (Moderna)   Moderna COVID-19 Vaccine 100 MCG/0.5ML injection Generic drug: COVID-19 mRNA vaccine (Moderna)   ondansetron 4 MG tablet Commonly known as: ZOFRAN Take 1 tablet (4 mg total) by mouth every 6 (six) hours as needed for nausea or vomiting.   pantoprazole 40 MG tablet Commonly known as: PROTONIX Take 1 tablet (40 mg total) by mouth 2 (two) times daily.   traZODone 100 MG tablet Commonly known as: DESYREL TAKE 1 TABLET BY MOUTH EVERYDAY AT BEDTIME   triamcinolone cream 0.1 % Commonly known as: KENALOG SMARTSIG:1 Application Topical 2-3 Times Daily        Allergies:  Allergies  Allergen Reactions  Imipramine Tinitus   Latex Rash   Penicillins Rash   Tape Rash    Family History: Family History  Problem Relation Age of Onset   Dementia Mother    Dementia Sister    Diabetes Sister    Heart attack Brother 75   COPD Brother    Lung cancer Brother        hx smoking   Colon cancer Brother    Liver cancer Brother    COPD Brother    Lung cancer Brother        hx smoking   Colon cancer Maternal Grandfather        dx >.50   Breast cancer Neg Hx     Social History:   reports that she has never smoked. She has never used smokeless tobacco. She reports that she does not drink alcohol and does not use drugs.  Physical Exam: BP (!) 147/71   Pulse 78   Ht 5\' 5"  (1.651 m)   Wt 163 lb (73.9 kg)   BMI 27.12 kg/m   Constitutional:  Alert and oriented, no acute distress, nontoxic appearing HEENT: [place], AT Cardiovascular: No clubbing, cyanosis, or edema Respiratory: Normal respiratory effort, no increased work of breathing Skin: No rashes, bruises or suspicious lesions Neurologic: Grossly intact, no focal deficits, moving all 4 extremities Psychiatric: Normal mood and affect  Laboratory Data: Results for orders placed or performed in visit on 02/13/23  BLADDER SCAN AMB NON-IMAGING  Result Value Ref Range   Scan Result 36 ml     Assessment & Plan:   1. OAB (overactive bladder) Myrbetriq lost efficacy, will stop this.  She is having excellent symptom control on Gemtesa, will plan to continue this.  If it is cost prohibitive for her, I did give her contact information for the drug company to see if she would qualify for low cost medications.  If unable to afford Gemtesa, recommended considering PTNS. - BLADDER SCAN AMB NON-IMAGING - Vibegron (GEMTESA) 75 MG TABS; Take 1 tablet (75 mg total) by mouth daily.  Dispense: 30 tablet; Refill: 11  Return in about 1 year (around 02/13/2024) for Annual OAB f/u with [facility].  Carman Ching, PA-C  [facility] [address] [place], [place] [address] [phone]

## 2023-03-09 ENCOUNTER — Ambulatory Visit (INDEPENDENT_AMBULATORY_CARE_PROVIDER_SITE_OTHER): Payer: PPO | Admitting: Nurse Practitioner

## 2023-03-13 DIAGNOSIS — C50911 Malignant neoplasm of unspecified site of right female breast: Secondary | ICD-10-CM | POA: Diagnosis not present

## 2023-03-13 DIAGNOSIS — I10 Essential (primary) hypertension: Secondary | ICD-10-CM | POA: Diagnosis not present

## 2023-03-13 DIAGNOSIS — R918 Other nonspecific abnormal finding of lung field: Secondary | ICD-10-CM | POA: Diagnosis not present

## 2023-03-13 DIAGNOSIS — H353230 Exudative age-related macular degeneration, bilateral, stage unspecified: Secondary | ICD-10-CM | POA: Diagnosis not present

## 2023-03-13 DIAGNOSIS — C779 Secondary and unspecified malignant neoplasm of lymph node, unspecified: Secondary | ICD-10-CM | POA: Diagnosis not present

## 2023-03-13 DIAGNOSIS — I8391 Asymptomatic varicose veins of right lower extremity: Secondary | ICD-10-CM | POA: Diagnosis not present

## 2023-03-13 DIAGNOSIS — I739 Peripheral vascular disease, unspecified: Secondary | ICD-10-CM | POA: Diagnosis not present

## 2023-03-20 ENCOUNTER — Ambulatory Visit
Admission: RE | Admit: 2023-03-20 | Discharge: 2023-03-20 | Disposition: A | Discharge status: Home / Self Care | Payer: PPO | Source: Ambulatory Visit | Attending: Oncology | Admitting: Oncology

## 2023-03-20 ENCOUNTER — Ambulatory Visit
Admission: RE | Admit: 2023-03-20 | Discharge: 2023-03-20 | Discharge status: Home / Self Care | Payer: PPO | Source: Ambulatory Visit | Attending: Oncology

## 2023-03-20 DIAGNOSIS — M4856XA Collapsed vertebra, not elsewhere classified, lumbar region, initial encounter for fracture: Secondary | ICD-10-CM | POA: Diagnosis not present

## 2023-03-20 DIAGNOSIS — Z79811 Long term (current) use of aromatase inhibitors: Secondary | ICD-10-CM | POA: Diagnosis not present

## 2023-03-20 DIAGNOSIS — C50919 Malignant neoplasm of unspecified site of unspecified female breast: Secondary | ICD-10-CM | POA: Diagnosis not present

## 2023-03-20 MED ORDER — TECHNETIUM TC 99M MEDRONATE IV KIT
20.0000 | PACK | Freq: Once | INTRAVENOUS | Status: AC | PRN
  Administered 2023-03-20: 21.82 via INTRAVENOUS

## 2023-04-06 ENCOUNTER — Telehealth: Payer: Self-pay | Admitting: *Deleted

## 2023-04-09 ENCOUNTER — Ambulatory Visit
Admission: RE | Admit: 2023-04-09 | Discharge: 2023-04-09 | Disposition: A | Discharge status: Home / Self Care | Payer: PPO | Source: Ambulatory Visit | Attending: Oncology | Admitting: Oncology

## 2023-04-09 DIAGNOSIS — C50919 Malignant neoplasm of unspecified site of unspecified female breast: Secondary | ICD-10-CM | POA: Diagnosis not present

## 2023-04-09 DIAGNOSIS — Z79811 Long term (current) use of aromatase inhibitors: Secondary | ICD-10-CM | POA: Diagnosis not present

## 2023-04-09 DIAGNOSIS — K449 Diaphragmatic hernia without obstruction or gangrene: Secondary | ICD-10-CM | POA: Insufficient documentation

## 2023-04-09 DIAGNOSIS — Z9049 Acquired absence of other specified parts of digestive tract: Secondary | ICD-10-CM | POA: Diagnosis not present

## 2023-04-09 DIAGNOSIS — I3139 Other pericardial effusion (noninflammatory): Secondary | ICD-10-CM | POA: Diagnosis not present

## 2023-04-09 MED ORDER — IOHEXOL 300 MG/ML  SOLN
100.0000 mL | Freq: Once | INTRAMUSCULAR | Status: AC | PRN
  Administered 2023-04-09: 100 mL via INTRAVENOUS

## 2023-04-11 DIAGNOSIS — H353211 Exudative age-related macular degeneration, right eye, with active choroidal neovascularization: Secondary | ICD-10-CM | POA: Diagnosis not present

## 2023-04-11 DIAGNOSIS — H353221 Exudative age-related macular degeneration, left eye, with active choroidal neovascularization: Secondary | ICD-10-CM | POA: Diagnosis not present

## 2023-04-16 ENCOUNTER — Telehealth: Payer: Self-pay | Admitting: *Deleted

## 2023-04-16 NOTE — Telephone Encounter (Signed)
Patient called to see when her appointment for seeing Dr. Smith Robert is 10 it is the same day that she gets her lab work that was on 211 and she already knows it now

## 2023-04-18 DIAGNOSIS — M5416 Radiculopathy, lumbar region: Secondary | ICD-10-CM | POA: Diagnosis not present

## 2023-04-18 DIAGNOSIS — M48062 Spinal stenosis, lumbar region with neurogenic claudication: Secondary | ICD-10-CM | POA: Diagnosis not present

## 2023-04-24 ENCOUNTER — Ambulatory Visit: Payer: PPO

## 2023-04-24 ENCOUNTER — Inpatient Hospital Stay: Payer: PPO | Attending: Oncology

## 2023-04-24 ENCOUNTER — Inpatient Hospital Stay: Payer: PPO | Admitting: Oncology

## 2023-04-24 ENCOUNTER — Encounter: Payer: Self-pay | Admitting: Oncology

## 2023-04-24 VITALS — BP 119/61 | HR 70 | Temp 98.9°F | Resp 20 | Wt 165.0 lb

## 2023-04-24 DIAGNOSIS — Z170 Estrogen receptor positive status [ER+]: Secondary | ICD-10-CM | POA: Insufficient documentation

## 2023-04-24 DIAGNOSIS — C50919 Malignant neoplasm of unspecified site of unspecified female breast: Secondary | ICD-10-CM

## 2023-04-24 DIAGNOSIS — M8588 Other specified disorders of bone density and structure, other site: Secondary | ICD-10-CM

## 2023-04-24 DIAGNOSIS — Z79811 Long term (current) use of aromatase inhibitors: Secondary | ICD-10-CM | POA: Diagnosis not present

## 2023-04-24 DIAGNOSIS — Z853 Personal history of malignant neoplasm of breast: Secondary | ICD-10-CM | POA: Insufficient documentation

## 2023-04-24 DIAGNOSIS — C782 Secondary malignant neoplasm of pleura: Secondary | ICD-10-CM | POA: Diagnosis not present

## 2023-04-24 LAB — COMPREHENSIVE METABOLIC PANEL
ALT: 15 U/L (ref 0–44)
AST: 21 U/L (ref 15–41)
Albumin: 3.7 g/dL (ref 3.5–5.0)
Alkaline Phosphatase: 54 U/L (ref 38–126)
Anion gap: 8 (ref 5–15)
BUN: 13 mg/dL (ref 8–23)
CO2: 25 mmol/L (ref 22–32)
Calcium: 8.5 mg/dL — ABNORMAL LOW (ref 8.9–10.3)
Chloride: 103 mmol/L (ref 98–111)
Creatinine, Ser: 0.77 mg/dL (ref 0.44–1.00)
GFR, Estimated: >60 mL/min (ref >60)
Glucose, Bld: 99 mg/dL (ref 70–99)
Potassium: 3.8 mmol/L (ref 3.5–5.1)
Sodium: 136 mmol/L (ref 135–145)
Total Bilirubin: 0.7 mg/dL (ref 0.0–1.2)
Total Protein: 6.8 g/dL (ref 6.5–8.1)

## 2023-04-24 LAB — CBC WITH DIFFERENTIAL/PLATELET
Abs Immature Granulocytes: 0.02 10*3/uL (ref 0.00–0.07)
Basophils Absolute: 0.1 10*3/uL (ref 0.0–0.1)
Basophils Relative: 1 %
Eosinophils Absolute: 0.1 10*3/uL (ref 0.0–0.5)
Eosinophils Relative: 2 %
HCT: 37.8 % (ref 36.0–46.0)
Hemoglobin: 12 g/dL (ref 12.0–15.0)
Immature Granulocytes: 0 %
Lymphocytes Relative: 29 %
Lymphs Abs: 1.8 10*3/uL (ref 0.7–4.0)
MCH: 28.4 pg (ref 26.0–34.0)
MCHC: 31.7 g/dL (ref 30.0–36.0)
MCV: 89.6 fL (ref 80.0–100.0)
Monocytes Absolute: 0.7 10*3/uL (ref 0.1–1.0)
Monocytes Relative: 10 %
Neutro Abs: 3.7 10*3/uL (ref 1.7–7.7)
Neutrophils Relative %: 58 %
Platelets: 220 10*3/uL (ref 150–400)
RBC: 4.22 MIL/uL (ref 3.87–5.11)
RDW: 12.7 % (ref 11.5–15.5)
WBC: 6.4 10*3/uL (ref 4.0–10.5)
nRBC: 0 % (ref 0.0–0.2)

## 2023-04-24 NOTE — Addendum Note (Signed)
Addended by: Ashley Royalty A on: 04/24/2023 01:11 PM   Modules accepted: Orders

## 2023-04-24 NOTE — Progress Notes (Signed)
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  She underwent mastectomy followed by adjuvant chemotherapy and 5 years of tamoxifen.  Patient has been having some low back pain and underwent a CT lumbar spine without contrast on 08/10/2017 which was done by the pain clinic.  CT mainly showed age-related degenerative disease but also showed incidental nodular thickening of the posterior left diaphragm concerning for tumor and a CT chest was recommended.   CT chest on 08/14/2017 showed scattered left pleural nodularity with tiny left pleural effusion and prominent left internal mammary and juxtadiaphragmatic lymph nodes which are nonspecific but metastatic disease could not be ruled out.   This was followed by a PET/CT scan on 08/22/2017 which showed numerous small left-sided pleural nodules which were mildly hypermetabolic along with hypermetabolic mediastinal lymph nodes concerning for metastatic disease.  Small metastatic nodule in the left upper quadrant partly in the retrocrural space.  No findings of pulmonary metastatic or osseous metastatic disease.    Patient underwent FNA of retrocrural LN which was positive for carcinoma but primary could not be ascertained. She then  underwent pleural biopsy which showed metastatic carcinoma consistent with breast primary. ER+ HER-2.  Patient started taking Ibrance in August 2019.  Ibrance on hold since January 2021 with stable disease on letrozole   NGS testing showed PIKCA and BRCA mutation       Interval history-patient reports chronic back pain in her lower back but feels that it has been worse over the last few weeks.  Tolerating letrozole well without any significant side effects  ECOG PS- 2 Pain scale- 4 Opioid associated constipation- no  Review of systems- Review of Systems  Constitutional:  Positive for malaise/fatigue.  Musculoskeletal:  Positive for back pain.      Allergies  Allergen Reactions   Imipramine Tinitus   Latex Rash   Penicillins Rash   Tape Rash     Past Medical History:  Diagnosis Date   Anxiety    Atrial fibrillation (HCC)    Breast cancer (HCC)    Breast cancer, right (HCC) 1999   RT MASTECTOMY and chemo tx's.    DDD (degenerative disc disease), cervical    hands and back with arthritis   Depression    Diverticulosis    Diverticulosis    DVT of lower extremity (deep venous thrombosis) (HCC)    Dyspnea    Family history of colon cancer    Family history of lung cancer    Family history of throat cancer    GERD (gastroesophageal reflux disease)    Goiter    Headache    History of chemotherapy 2000   BREAST CA   Hypertension    Incontinence    Neuromuscular disorder (HCC)    peipheral neuropathy   OSA (obstructive sleep apnea)  Osteoporosis    Personal history of chemotherapy 1999   BREAST CA   Status post chemotherapy    2000 right breast cancer   Thyroid disease    UTI (urinary tract infection)    Vertigo      Past Surgical History:  Procedure Laterality Date   ABDOMINAL HYSTERECTOMY     BREAST BIOPSY Left 2006   negative   BUNIONECTOMY Bilateral    Screws implanted.   CHOLECYSTECTOMY     COCHLEAR IMPLANT Right    COLONOSCOPY WITH PROPOFOL N/A  07/24/2016   Procedure: COLONOSCOPY WITH PROPOFOL;  Surgeon: Scot Jun, MD;  Location: [facility] ENDOSCOPY;  Service: Endoscopy;  Laterality: N/A;   ESOPHAGOGASTRODUODENOSCOPY (EGD) WITH PROPOFOL N/A 04/12/2016   Procedure: ESOPHAGOGASTRODUODENOSCOPY (EGD) WITH PROPOFOL;  Surgeon: Scot Jun, MD;  Location: [facility] ENDOSCOPY;  Service: Endoscopy;  Laterality: N/A;   ESOPHAGOGASTRODUODENOSCOPY (EGD) WITH PROPOFOL N/A 05/03/2022   Procedure: ESOPHAGOGASTRODUODENOSCOPY (EGD) WITH PROPOFOL;  Surgeon: Toledo, Boykin Nearing, MD;  Location: [facility] ENDOSCOPY;  Service: Gastroenterology;  Laterality: N/A;   EYE SURGERY Bilateral    cataract   FOOT SURGERY Bilateral    HAND SURGERY Right    KYPHOPLASTY N/A 05/20/2020   Procedure: L3 KYPHOPLASTY;  Surgeon: Kennedy Bucker, MD;  Location: [facility] ORS;  Service: Orthopedics;  Laterality: N/A;   MASTECTOMY Right 1999   BREAST CA   MASTECTOMY Right 1999   PARTIAL COLECTOMY     THYROIDECTOMY     TOTAL HIP ARTHROPLASTY Left 03/19/2018   Procedure: TOTAL HIP ARTHROPLASTY LEFT;  Surgeon: Kennedy Bucker, MD;  Location: [facility] ORS;  Service: Orthopedics;  Laterality: Left;   VIDEO ASSISTED THORACOSCOPY Left 09/10/2017   Procedure: VIDEO ASSISTED THORACOSCOPY;  Surgeon: Hulda Marin, MD;  Location: [facility] ORS;  Service: Thoracic;  Laterality: Left;  with biopsies   VIDEO BRONCHOSCOPY  09/10/2017   Procedure: VIDEO BRONCHOSCOPY;  Surgeon: Hulda Marin, MD;  Location: [facility] ORS;  Service: Thoracic;;    Social History   Socioeconomic History   Marital status: Married    Spouse name: evert   Number of children: 4   Years of education: Not on file   Highest education level: 9th grade  Occupational History   Not on file  Tobacco Use   Smoking status: Never   Smokeless tobacco: Never  Vaping Use   Vaping status: Never Used  Substance and Sexual Activity   Alcohol use: No    Alcohol/week: 0.0 standard drinks of alcohol   Drug use: No   Sexual activity: Not Currently   Other Topics Concern   Not on file  Social History Narrative   Not on file   Social Drivers of Health   Financial Resource Strain: Low Risk  (12/04/2022)   Received from [facility]   Overall Financial Resource Strain (CARDIA)    Difficulty of Paying Living Expenses: Not hard at all  Food Insecurity: No Food Insecurity (12/04/2022)   Received from [facility]   Hunger Vital Sign    Worried About Running Out of Food in the Last Year: Never true    Ran Out of Food in the Last Year: Never true  Transportation Needs: No Transportation Needs (12/04/2022)   Received from [facility] - Transportation    In the past 12 months, has lack of transportation kept you from medical appointments or from getting medications?: No    Lack of Transportation (Non-Medical): No  Physical Activity: Inactive (09/24/2017)  Exercise Vital Sign    Days of Exercise per Week: 0 days    Minutes of Exercise per Session: 0 min  Stress: No Stress Concern Present (09/24/2017)   [employer] of Occupational Health - Occupational Stress Questionnaire    Feeling of Stress : Not at all  Social Connections: Unknown (09/24/2017)   Social Connection and Isolation Panel [NHANES]    Frequency of Communication with Friends and Family: Not on file    Frequency of Social Gatherings with Friends and Family: Not on file    Attends Religious Services: More than 4 times per year    Active Member of [employer] or Organizations: No    Attends Banker Meetings: Never    Marital Status: Married  Catering manager Violence: Not At Risk (09/24/2017)   Humiliation, Afraid, Rape, and Kick questionnaire    Fear of Current or Ex-Partner: No    Emotionally Abused: No    Physically Abused: No    Sexually Abused: No    Family History  Problem Relation Age of Onset   Dementia Mother    Dementia Sister    Diabetes Sister    Heart attack Brother 75   COPD  Brother    Lung cancer Brother        hx smoking   Colon cancer Brother    Liver cancer Brother    COPD Brother    Lung cancer Brother        hx smoking   Colon cancer Maternal Grandfather        dx >.50   Breast cancer Neg Hx      Current Outpatient Medications:    acetaminophen (TYLENOL) 325 MG tablet, Take 650 mg by mouth every 4 (four) hours as needed. For pain / increased temp.  May be administered orally,  per G-Tube if needed or rectally if unable to swallow (separate order).  Maximum dose for 24 hours is 3,000 mg from all sources of Acetaminophen / Tylenol, Disp: , Rfl:    atorvastatin (LIPITOR) 40 MG tablet, Take 1 tablet (40 mg total) by mouth at bedtime., Disp: 30 tablet, Rfl: 0   Baclofen 5 MG TABS, Take 1 tablet by mouth 2 (two) times daily., Disp: , Rfl:    busPIRone (BUSPAR) 10 MG tablet, Take 1 tablet (10 mg total) by mouth 2 (two) times daily., Disp: 180 tablet, Rfl: 1   cholecalciferol (VITAMIN D) 1000 units tablet, Take 1,000 Units by mouth daily., Disp: , Rfl:    DULoxetine (CYMBALTA) 60 MG capsule, Take 1 capsule (60 mg total) by mouth every morning., Disp: 90 capsule, Rfl: 3   FLUAD QUADRIVALENT 0.5 ML injection, , Disp: , Rfl:    gabapentin (NEURONTIN) 300 MG capsule, Take 300 mg by mouth 2 (two) times daily., Disp: , Rfl:    HYDROcodone-acetaminophen (NORCO/VICODIN) 5-325 MG tablet, Take by mouth. Takes 7.5-325 mg, Disp: , Rfl:    Lactulose 20 GM/30ML SOLN, Take 15 mLs (10 g total) by mouth every 12 (twelve) hours as needed., Disp: 450 mL, Rfl: 0   letrozole (FEMARA) 2.5 MG tablet, Take 1 tablet (2.5 mg total) by mouth daily., Disp: 90 tablet, Rfl: 1   levothyroxine (SYNTHROID) 100 MCG tablet, Take 1 tablet by mouth daily., Disp: , Rfl:    MODERNA COVID-19 BIVAL BOOSTER 50 MCG/0.5ML injection, , Disp: , Rfl:    MODERNA COVID-19 VACCINE 100 MCG/0.5ML injection, , Disp: , Rfl:    ondansetron (ZOFRAN) 4 MG tablet, Take 1 tablet (4  mg total) by mouth every 6 (six)  hours as needed for nausea or vomiting., Disp: 30 tablet, Rfl: 1   pantoprazole (PROTONIX) 40 MG tablet, Take 1 tablet (40 mg total) by mouth 2 (two) times daily., Disp: 60 tablet, Rfl: 0   traZODone (DESYREL) 100 MG tablet, TAKE 1 TABLET BY MOUTH EVERYDAY AT BEDTIME, Disp: 90 tablet, Rfl: 3   triamcinolone cream (KENALOG) 0.1 %, SMARTSIG:1 Application Topical 2-3 Times Daily, Disp: , Rfl:    Vibegron (GEMTESA) 75 MG TABS, Take 1 tablet (75 mg total) by mouth daily., Disp: 30 tablet, Rfl: 11   vitamin B-12 (CYANOCOBALAMIN) 500 MCG tablet, Take 500 mcg by mouth daily. , Disp: , Rfl:   Physical exam:  Vitals:   04/24/23 1015  BP: 119/61  Pulse: 70  Resp: 20  Temp: 98.9 F (37.2 C)  SpO2: 100%  Weight: 165 lb (74.8 kg)   Physical Exam Constitutional:      Comments: Ambulates with a cane.  Appears in no acute distress  Cardiovascular:     Rate and Rhythm: Normal rate and regular rhythm.     Heart sounds: Normal heart sounds.  Pulmonary:     Effort: Pulmonary effort is normal.     Breath sounds: Normal breath sounds.  Abdominal:     General: Bowel sounds are normal.     Palpations: Abdomen is soft.  Skin:    General: Skin is warm and dry.  Neurological:     Mental Status: She is alert and oriented to person, place, and time.         Latest Ref Rng & Units 04/24/2023    9:53 AM  CMP  Glucose 70 - 99 mg/dL 99   BUN 8 - 23 mg/dL 13   Creatinine 8.65 - 1.00 mg/dL 7.84   Sodium 696 - 295 mmol/L 136   Potassium 3.5 - 5.1 mmol/L 3.8   Chloride 98 - 111 mmol/L 103   CO2 22 - 32 mmol/L 25   Calcium 8.9 - 10.3 mg/dL 8.5   Total Protein 6.5 - 8.1 g/dL 6.8   Total Bilirubin 0.0 - 1.2 mg/dL 0.7   Alkaline Phos 38 - 126 U/L 54   AST 15 - 41 U/L 21   ALT 0 - 44 U/L 15       Latest Ref Rng & Units 04/24/2023    9:53 AM  CBC  WBC 4.0 - 10.5 K/uL 6.4   Hemoglobin 12.0 - 15.0 g/dL 28.4   Hematocrit 13.2 - 46.0 % 37.8   Platelets 150 - 400 K/uL 220     No images are attached to  the encounter.  CT CHEST ABDOMEN PELVIS W CONTRAST Result Date: 04/12/2023 CLINICAL DATA:  Breast cancer.  Restaging.  * Tracking Code: BO * EXAM: CT CHEST, ABDOMEN, AND PELVIS WITH CONTRAST TECHNIQUE: Multidetector CT imaging of the chest, abdomen and pelvis was performed following the standard protocol during bolus administration of intravenous contrast. RADIATION DOSE REDUCTION: This exam was performed according to the departmental dose-optimization program which includes automated exposure control, adjustment of the mA and/or kV according to patient size and/or use of iterative reconstruction technique. CONTRAST:  OMNIPAQUE IOHEXOL 300 MG/ML  SOLN COMPARISON:  CT 04/19/2022.  Bone scan 03/20/2023. FINDINGS: CT CHEST FINDINGS Cardiovascular: Heart is nonenlarged. Trace pericardial fluid. Coronary calcifications are seen. Calcifications along the mitral valve annulus. Small pericardial effusion. The thoracic aorta is normal course and caliber with scattered atherosclerotic partially calcified scattered plaque. Also some  plaque along the left subclavian artery origin. Mediastinum/Nodes: Moderate hiatal hernia. Slight wall thickening are esophagus. Atrophic thyroid gland. No abnormal lymph node enlargement identified in the axillary regions, hilum or mediastinum. Surgical clips in the right axillary region. Previous right mastectomy. Lungs/Pleura: There is some linear opacity seen along the lung bases likely scar or atelectasis. No consolidation, pneumothorax or effusion. Few tiny nodules are identified. Some which are calcified and related to old granulomatous disease. Noncalcified nodules include along the interlobar fissure on the left measuring 4 mm on series 4, image 69 which is unchanged from previous. Additional small focus medial to this as well on series 4, image 68. No new dominant lung nodules identified at this time. Mild bilateral apical pleural thickening. Musculoskeletal: Mild degenerative  changes of the spine with osteopenia. Bridging osteophytes seen. CT ABDOMEN PELVIS FINDINGS Hepatobiliary: No focal liver abnormality is seen. Status post cholecystectomy. No biliary dilatation. Pancreas: Unremarkable. No pancreatic ductal dilatation or surrounding inflammatory changes. Spleen: Normal in size without focal abnormality. Adrenals/Urinary Tract: Adrenal glands are preserved. Moderate right and mild left renal atrophy. 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 The ureters have normal course and caliber extending down to the urinary bladder. Bladder is underdistended. 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 These are unchanged from previous. Please correlate for additional history of possible urethral filler injection. Stomach/Bowel: Surgical changes along the right side of the colon with partial colectomy. Also lies the large bowel has a redundant course with moderate colonic stool. No obstruction. Few sigmoid colon diverticula. The stomach and small bowel are nondilated. Vascular/Lymphatic: Aortic atherosclerosis. No enlarged abdominal or pelvic lymph nodes. Reproductive: Status post hysterectomy. No adnexal masses. Other: No free air or free fluid. Musculoskeletal: Streak artifact related to patient's left hip arthroplasty. Scattered degenerative changes of the spine and pelvis. Osteopenia. There is augmentation cement within the L3 vertebral level. The recent bone scan demonstrated some uptake at the L2 level. Today the L2 vertebral body is with some mild superior endplate compression which was not seen on the prior CT scan. There is displacement of the superior endplate posteriorly toward central canal with some canal encroachment. Mild sclerosis in this area. Based on appearance favor a benign compression but there is a differential in the setting of breast cancer. IMPRESSION: New compression  of the superior endplate of L2 with some canal encroachment compared to the CT scan of February 2024. However there is a report of a lumbar spine x-ray from 10/10/2022 from outside institution describing L2 compression deformity. Please correlate with any known injury or specific history. This would correspond to the abnormal uptake on bone scan. Otherwise no significant oval change. No developing new mass lesion, fluid collection or lymph node enlargement. Moderate hiatal hernia. Cervical surgical changes from right mastectomy, axillary lymph node dissection. Separate partial right hemicolectomy. Electronically Signed   By: Karen Kays M.D.   On: 04/12/2023 18:55     Assessment and plan- Patient is a 83 y.o. female with history of low-volume metastatic ER positive breast cancer with mets to pleura and lymph nodes.  She is presently on letrozole and this is a routine follow-up visit and also to discuss CT scan results  I have reviewed CT chest abdomen pelvis images independently and discussed findings with the patient along with bone scan results.  Overall her pleural nodules have resolved and there is no evidence of active disease on today's  scans.  She was however noted to have new compression fracture of her L2 vertebral body which I suspect is secondary to her underlying osteopenia.  We will proceed with a CT lumbar spine without contrast at this time.  I will also repeat her bone density scan and see her to discuss the results of both.  I will reach out to radiology following her CT to see if she would be a candidate for kyphoplasty which can help with her back pain.  Patient has low-volume ER positive metastatic breast cancer and has been on letrozole since 2019.  There is a potential concern for disease progression if letrozole was stopped although letrozole can potentially worsen her osteopenia/osteoporosis.  I had discussed both Fosamax versus Reclast with her in the past and she wanted to hold  off.  I will have this discussion with her again after her bone density scan results are back.   Visit Diagnosis 1. Use of letrozole (Femara)   2. Osteopenia of lumbar spine   3. Primary malignant neoplasm of breast with metastasis (HCC)      Dr. Owens Shark, MD, MPH [facility] [phone] 04/24/2023 12:53 PM

## 2023-04-25 LAB — CANCER ANTIGEN 27.29: CA 27.29: 28 U/mL (ref 0.0–38.6)

## 2023-04-25 LAB — CANCER ANTIGEN 15-3: CA 15-3: 27.6 U/mL — ABNORMAL HIGH (ref 0.0–25.0)

## 2023-05-01 ENCOUNTER — Telehealth: Payer: Self-pay | Admitting: *Deleted

## 2023-05-01 NOTE — Telephone Encounter (Signed)
I spoke to the pt. And she wanted to know if she does not come because of the weather would she have to pay for the no show and I told her no, she will try to get it in am per pt. Her husband will drive her

## 2023-05-02 ENCOUNTER — Other Ambulatory Visit: Payer: PPO

## 2023-05-02 ENCOUNTER — Ambulatory Visit: Payer: PPO

## 2023-05-02 ENCOUNTER — Ambulatory Visit
Admission: RE | Admit: 2023-05-02 | Discharge: 2023-05-02 | Disposition: A | Discharge status: Home / Self Care | Payer: PPO | Source: Ambulatory Visit | Attending: Oncology | Admitting: Oncology

## 2023-05-02 DIAGNOSIS — M8588 Other specified disorders of bone density and structure, other site: Secondary | ICD-10-CM | POA: Diagnosis not present

## 2023-05-02 DIAGNOSIS — K449 Diaphragmatic hernia without obstruction or gangrene: Secondary | ICD-10-CM | POA: Diagnosis not present

## 2023-05-02 DIAGNOSIS — M8008XA Age-related osteoporosis with current pathological fracture, vertebra(e), initial encounter for fracture: Secondary | ICD-10-CM | POA: Diagnosis not present

## 2023-05-02 DIAGNOSIS — M48061 Spinal stenosis, lumbar region without neurogenic claudication: Secondary | ICD-10-CM | POA: Diagnosis not present

## 2023-05-02 DIAGNOSIS — M5116 Intervertebral disc disorders with radiculopathy, lumbar region: Secondary | ICD-10-CM | POA: Diagnosis not present

## 2023-05-02 DIAGNOSIS — Z79811 Long term (current) use of aromatase inhibitors: Secondary | ICD-10-CM | POA: Diagnosis not present

## 2023-05-09 DIAGNOSIS — I10 Essential (primary) hypertension: Secondary | ICD-10-CM | POA: Diagnosis not present

## 2023-05-09 DIAGNOSIS — F3342 Major depressive disorder, recurrent, in full remission: Secondary | ICD-10-CM | POA: Diagnosis not present

## 2023-05-09 DIAGNOSIS — I700 Atherosclerosis of aorta: Secondary | ICD-10-CM | POA: Diagnosis not present

## 2023-05-09 DIAGNOSIS — C50919 Malignant neoplasm of unspecified site of unspecified female breast: Secondary | ICD-10-CM | POA: Diagnosis not present

## 2023-05-09 DIAGNOSIS — R7303 Prediabetes: Secondary | ICD-10-CM | POA: Diagnosis not present

## 2023-05-09 DIAGNOSIS — C7802 Secondary malignant neoplasm of left lung: Secondary | ICD-10-CM | POA: Diagnosis not present

## 2023-05-09 DIAGNOSIS — R300 Dysuria: Secondary | ICD-10-CM | POA: Diagnosis not present

## 2023-05-09 DIAGNOSIS — J069 Acute upper respiratory infection, unspecified: Secondary | ICD-10-CM | POA: Diagnosis not present

## 2023-05-15 ENCOUNTER — Inpatient Hospital Stay: Payer: PPO | Admitting: Oncology

## 2023-05-17 ENCOUNTER — Ambulatory Visit: Payer: PPO | Admitting: Psychiatry

## 2023-05-30 ENCOUNTER — Telehealth: Payer: Self-pay | Admitting: *Deleted

## 2023-05-30 DIAGNOSIS — M51369 Other intervertebral disc degeneration, lumbar region without mention of lumbar back pain or lower extremity pain: Secondary | ICD-10-CM | POA: Diagnosis not present

## 2023-05-30 DIAGNOSIS — I10 Essential (primary) hypertension: Secondary | ICD-10-CM | POA: Diagnosis not present

## 2023-05-30 DIAGNOSIS — E034 Atrophy of thyroid (acquired): Secondary | ICD-10-CM | POA: Diagnosis not present

## 2023-05-30 DIAGNOSIS — K219 Gastro-esophageal reflux disease without esophagitis: Secondary | ICD-10-CM | POA: Diagnosis not present

## 2023-05-30 DIAGNOSIS — C50919 Malignant neoplasm of unspecified site of unspecified female breast: Secondary | ICD-10-CM | POA: Diagnosis not present

## 2023-05-30 DIAGNOSIS — R7303 Prediabetes: Secondary | ICD-10-CM | POA: Diagnosis not present

## 2023-05-30 NOTE — Telephone Encounter (Signed)
 Ok to refill letrozole

## 2023-05-30 NOTE — Telephone Encounter (Signed)
 Pt called in for 3 months of letrozole. I wanted to see if you want me to give her the med. It looked your ast note about asking her is starting meds for her bones and she did not want it and what about letrozole?let me know an I can order it if that is ok with you

## 2023-05-31 ENCOUNTER — Telehealth: Payer: Self-pay | Admitting: *Deleted

## 2023-05-31 MED ORDER — LETROZOLE 2.5 MG PO TABS
2.5000 mg | ORAL_TABLET | Freq: Every day | ORAL | 1 refills | Status: DC
Start: 2023-05-31 — End: 2023-11-26

## 2023-05-31 NOTE — Telephone Encounter (Signed)
 I called back to her home and son answered. I told them I sent to the group and Rao sent me a ok to fill but she did not tell me to do . I put it in today and rao will appprove it . It should be ready by tom. Son will let her know

## 2023-06-06 DIAGNOSIS — C7802 Secondary malignant neoplasm of left lung: Secondary | ICD-10-CM | POA: Diagnosis not present

## 2023-06-06 DIAGNOSIS — C50919 Malignant neoplasm of unspecified site of unspecified female breast: Secondary | ICD-10-CM | POA: Diagnosis not present

## 2023-06-06 DIAGNOSIS — I10 Essential (primary) hypertension: Secondary | ICD-10-CM | POA: Diagnosis not present

## 2023-06-06 DIAGNOSIS — R7303 Prediabetes: Secondary | ICD-10-CM | POA: Diagnosis not present

## 2023-06-06 DIAGNOSIS — F3342 Major depressive disorder, recurrent, in full remission: Secondary | ICD-10-CM | POA: Diagnosis not present

## 2023-06-06 DIAGNOSIS — I700 Atherosclerosis of aorta: Secondary | ICD-10-CM | POA: Diagnosis not present

## 2023-06-06 DIAGNOSIS — G4733 Obstructive sleep apnea (adult) (pediatric): Secondary | ICD-10-CM | POA: Diagnosis not present

## 2023-06-06 DIAGNOSIS — E785 Hyperlipidemia, unspecified: Secondary | ICD-10-CM | POA: Diagnosis not present

## 2023-06-06 DIAGNOSIS — M51362 Other intervertebral disc degeneration, lumbar region with discogenic back pain and lower extremity pain: Secondary | ICD-10-CM | POA: Diagnosis not present

## 2023-06-06 DIAGNOSIS — E034 Atrophy of thyroid (acquired): Secondary | ICD-10-CM | POA: Diagnosis not present

## 2023-06-07 ENCOUNTER — Telehealth: Payer: Self-pay | Admitting: *Deleted

## 2023-06-07 NOTE — Telephone Encounter (Signed)
 She does need to com to. At 10:30. She will be there

## 2023-06-08 ENCOUNTER — Inpatient Hospital Stay: Attending: Oncology | Admitting: Oncology

## 2023-06-08 ENCOUNTER — Telehealth: Payer: Self-pay | Admitting: *Deleted

## 2023-06-08 VITALS — BP 127/49 | HR 92 | Temp 98.0°F | Resp 18 | Ht 66.0 in | Wt 164.0 lb

## 2023-06-08 DIAGNOSIS — C50919 Malignant neoplasm of unspecified site of unspecified female breast: Secondary | ICD-10-CM

## 2023-06-08 DIAGNOSIS — Z9011 Acquired absence of right breast and nipple: Secondary | ICD-10-CM | POA: Diagnosis not present

## 2023-06-08 DIAGNOSIS — Z1732 Human epidermal growth factor receptor 2 negative status: Secondary | ICD-10-CM | POA: Diagnosis not present

## 2023-06-08 DIAGNOSIS — C782 Secondary malignant neoplasm of pleura: Secondary | ICD-10-CM | POA: Diagnosis not present

## 2023-06-08 DIAGNOSIS — Z9221 Personal history of antineoplastic chemotherapy: Secondary | ICD-10-CM | POA: Diagnosis not present

## 2023-06-08 DIAGNOSIS — C771 Secondary and unspecified malignant neoplasm of intrathoracic lymph nodes: Secondary | ICD-10-CM | POA: Diagnosis not present

## 2023-06-08 DIAGNOSIS — M8588 Other specified disorders of bone density and structure, other site: Secondary | ICD-10-CM | POA: Diagnosis not present

## 2023-06-08 DIAGNOSIS — Z170 Estrogen receptor positive status [ER+]: Secondary | ICD-10-CM | POA: Insufficient documentation

## 2023-06-08 DIAGNOSIS — Z79811 Long term (current) use of aromatase inhibitors: Secondary | ICD-10-CM | POA: Insufficient documentation

## 2023-06-08 DIAGNOSIS — Z1509 Genetic susceptibility to other malignant neoplasm: Secondary | ICD-10-CM | POA: Diagnosis not present

## 2023-06-08 DIAGNOSIS — Z853 Personal history of malignant neoplasm of breast: Secondary | ICD-10-CM | POA: Diagnosis not present

## 2023-06-08 DIAGNOSIS — Z148 Genetic carrier of other disease: Secondary | ICD-10-CM | POA: Insufficient documentation

## 2023-06-08 MED ORDER — ONDANSETRON HCL 4 MG PO TABS: 4.0000 mg | ORAL_TABLET | Freq: Four times a day (QID) | ORAL | 0 refills | Status: DC | PRN

## 2023-06-08 MED ORDER — IBANDRONATE SODIUM 150 MG PO TABS: 150.0000 mg | ORAL_TABLET | ORAL | 3 refills | Status: AC

## 2023-06-08 NOTE — Telephone Encounter (Signed)
 Pt . Told staff that she wanted zofran, Dr.rao says yes she will send some to pharmacy but she wants wants her to check with PCP Dr. Graciela Husbands if nausea continues. Pt knows to call Graciela Husbands in future about nausea if it continues to be an issue. She is ok with that.

## 2023-06-11 ENCOUNTER — Other Ambulatory Visit: Payer: Self-pay

## 2023-06-11 ENCOUNTER — Encounter: Payer: Self-pay | Admitting: Oncology

## 2023-06-11 ENCOUNTER — Telehealth: Payer: Self-pay | Admitting: *Deleted

## 2023-06-11 ENCOUNTER — Other Ambulatory Visit: Payer: Self-pay | Admitting: Oncology

## 2023-06-11 DIAGNOSIS — M8588 Other specified disorders of bone density and structure, other site: Secondary | ICD-10-CM

## 2023-06-11 DIAGNOSIS — C50919 Malignant neoplasm of unspecified site of unspecified female breast: Secondary | ICD-10-CM

## 2023-06-11 NOTE — Progress Notes (Signed)
 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  She underwent mastectomy followed by adjuvant chemotherapy and 5 years of tamoxifen.  Patient has been having some low back pain and underwent a CT lumbar spine without contrast on 08/10/2017 which was done by the pain clinic.  CT mainly showed age-related degenerative disease but also showed incidental nodular thickening of the posterior left diaphragm concerning for tumor and a CT chest was recommended.   CT chest on 08/14/2017 showed scattered left pleural nodularity with tiny left pleural effusion and prominent left internal mammary and juxtadiaphragmatic lymph nodes which are nonspecific but metastatic disease could not be ruled out.   This was followed by a PET/CT scan on 08/22/2017 which showed numerous small left-sided pleural nodules which were mildly hypermetabolic along with hypermetabolic mediastinal lymph nodes concerning for metastatic disease.  Small metastatic nodule in the left upper quadrant partly in the retrocrural space.  No findings of pulmonary metastatic or osseous metastatic disease.    Patient underwent FNA of retrocrural LN which was positive for carcinoma but primary could not be ascertained. She  then underwent pleural biopsy which showed metastatic carcinoma consistent with breast primary. ER+ HER-2.  Patient started taking Ibrance in August 2019.  Ibrance on hold since January 2021 with stable disease on letrozole   NGS testing showed PIKCA and BRCA mutation       Interval history-she continues to report low back pain not well-controlled with oral pain medications.  She is following up with pain clinic Dr. Epifania Gore for this  ECOG PS- 2 Pain scale- 5 Opioid associated constipation- no  Review of systems- Review of Systems  Constitutional:  Positive for malaise/fatigue. Negative for chills, fever and weight loss.  HENT:  Negative for congestion, ear discharge and nosebleeds.   Eyes:  Negative for blurred vision.  Respiratory:  Negative for cough, hemoptysis, sputum production, shortness of breath and wheezing.   Cardiovascular:  Negative for chest pain, palpitations, orthopnea and claudication.  Gastrointestinal:  Negative for abdominal pain, blood in stool, constipation, diarrhea, heartburn, melena, nausea and vomiting.  Genitourinary:  Negative for dysuria, flank pain, frequency, hematuria and urgency.  Musculoskeletal:  Positive for back pain. Negative for joint pain and myalgias.  Skin:  Negative for rash.  Neurological:  Negative for dizziness, tingling, focal weakness, seizures, weakness and headaches.  Endo/Heme/Allergies:  Does not bruise/bleed easily.  Psychiatric/Behavioral:  Negative for depression and suicidal ideas. The patient does not have insomnia.       Allergies  Allergen Reactions   Imipramine Tinitus   Latex Rash   Penicillins Rash   Tape Rash     Past Medical History:  Diagnosis Date   Anxiety    Atrial fibrillation (HCC)    Breast cancer (HCC)    Breast cancer, right (HCC) 1999  RT MASTECTOMY and chemo tx's.    DDD (degenerative disc disease), cervical    hands and back with arthritis   Depression    Diverticulosis    Diverticulosis     DVT of lower extremity (deep venous thrombosis) (HCC)    Dyspnea    Family history of colon cancer    Family history of lung cancer    Family history of throat cancer    GERD (gastroesophageal reflux disease)    Goiter    Headache    History of chemotherapy 2000   BREAST CA   Hypertension    Incontinence    Neuromuscular disorder (HCC)    peipheral neuropathy   OSA (obstructive sleep apnea)    Osteoporosis    Personal history of chemotherapy 1999   BREAST CA   Status post chemotherapy    2000 right breast cancer   Thyroid disease    UTI (urinary tract infection)    Vertigo      Past Surgical History:  Procedure Laterality Date   ABDOMINAL HYSTERECTOMY     BREAST BIOPSY Left 2006   negative   BUNIONECTOMY Bilateral    Screws implanted.   CHOLECYSTECTOMY     COCHLEAR IMPLANT Right    COLONOSCOPY WITH PROPOFOL N/A 07/24/2016   Procedure: COLONOSCOPY WITH PROPOFOL;  Surgeon: Scot Jun, MD;  Location[facility] ENDOSCOPY;  Service: Endoscopy;  Laterality: N/A;   ESOPHAGOGASTRODUODENOSCOPY (EGD) WITH PROPOFOL N/A 04/12/2016   Procedure: ESOPHAGOGASTRODUODENOSCOPY (EGD) WITH PROPOFOL;  Surgeon: Scot Jun, MD;  Location: [facility] ENDOSCOPY;  Service: Endoscopy;  Laterality: N/A;   ESOPHAGOGASTRODUODENOSCOPY (EGD) WITH PROPOFOL N/A 05/03/2022   Procedure: ESOPHAGOGASTRODUODENOSCOPY (EGD) WITH PROPOFOL;  Surgeon: Toledo, Boykin Nearing, MD;  Location: [facility] ENDOSCOPY;  Service: Gastroenterology;  Laterality: N/A;   EYE SURGERY Bilateral    cataract   FOOT SURGERY Bilateral    HAND SURGERY Right    KYPHOPLASTY N/A 05/20/2020   Procedure: L3 KYPHOPLASTY;  Surgeon: Kennedy Bucker, MD;  Location: [facility] ORS;  Service: Orthopedics;  Laterality: N/A;   MASTECTOMY Right 1999   BREAST CA   MASTECTOMY Right 1999   PARTIAL COLECTOMY     THYROIDECTOMY     TOTAL HIP ARTHROPLASTY Left 03/19/2018   Procedure: TOTAL HIP ARTHROPLASTY LEFT;  Surgeon: Kennedy Bucker, MD;  Location: [facility] ORS;  Service:  Orthopedics;  Laterality: Left;   VIDEO ASSISTED THORACOSCOPY Left 09/10/2017   Procedure: VIDEO ASSISTED THORACOSCOPY;  Surgeon: Hulda Marin, MD;  Location: [facility] ORS;  Service: Thoracic;  Laterality: Left;  with biopsies   VIDEO BRONCHOSCOPY  09/10/2017   Procedure: VIDEO BRONCHOSCOPY;  Surgeon: Hulda Marin, MD;  Location: [facility] ORS;  Service: Thoracic;;    Social History   Socioeconomic History   Marital status: Married    Spouse name: evert   Number of children: 4   Years of education: Not on file   Highest education level: 9th grade  Occupational History   Not on file  Tobacco Use   Smoking status: Never   Smokeless tobacco: Never  Vaping Use   Vaping status: Never Used  Substance and Sexual Activity   Alcohol use: No    Alcohol/week: 0.0 standard drinks of alcohol   Drug use: No   Sexual activity: Not Currently  Other Topics Concern   Not on file  Social History Narrative   Not on file   Social Drivers of Health   Financial Resource Strain: Low Risk  (12/04/2022)   Received from [facility]  Overall Financial Resource Strain (CARDIA)    Difficulty of Paying Living Expenses: Not hard at all  Food Insecurity: No Food Insecurity (12/04/2022)   Received from [facility]   Hunger Vital Sign    Worried About Running Out of Food in the Last Year: Never true    Ran Out of Food in the Last Year: Never true  Transportation Needs: No Transportation Needs (12/04/2022)   Received from [facility] - Transportation    In the past 12 months, has lack of transportation kept you from medical appointments or from getting medications?: No    Lack of Transportation (Non-Medical): No  Physical Activity: Inactive (09/24/2017)   Exercise Vital Sign    Days of Exercise per Week: 0 days    Minutes of Exercise per Session: 0 min  Stress: No Stress Concern Present (09/24/2017)   [employer] of Occupational Health -  Occupational Stress Questionnaire    Feeling of Stress : Not at all  Social Connections: Unknown (09/24/2017)   Social Connection and Isolation Panel [NHANES]    Frequency of Communication with Friends and Family: Not on file    Frequency of Social Gatherings with Friends and Family: Not on file    Attends Religious Services: More than 4 times per year    Active Member of [employer] or Organizations: No    Attends Banker Meetings: Never    Marital Status: Married  Catering manager Violence: Not At Risk (09/24/2017)   Humiliation, Afraid, Rape, and Kick questionnaire    Fear of Current or Ex-Partner: No    Emotionally Abused: No    Physically Abused: No    Sexually Abused: No    Family History  Problem Relation Age of Onset   Dementia Mother    Dementia Sister    Diabetes Sister    Heart attack Brother 79   COPD Brother    Lung cancer Brother        hx smoking   Colon cancer Brother    Liver cancer Brother    COPD Brother    Lung cancer Brother        hx smoking   Colon cancer Maternal Grandfather        dx >.50   Breast cancer Neg Hx      Current Outpatient Medications:    ibandronate (BONIVA) 150 MG tablet, Take 1 tablet (150 mg total) by mouth every 30 (thirty) days. Take in the morning with a full glass of water, on an empty stomach, and do not take anything else by mouth or lie down for the next 30 min., Disp: 3 tablet, Rfl: 3   acetaminophen (TYLENOL) 325 MG tablet, Take 650 mg by mouth every 4 (four) hours as needed. For pain / increased temp.  May be administered orally,  per G-Tube if needed or rectally if unable to swallow (separate order).  Maximum dose for 24 hours is 3,000 mg from all sources of Acetaminophen / Tylenol, Disp: , Rfl:    atorvastatin (LIPITOR) 40 MG tablet, Take 1 tablet (40 mg total) by mouth at bedtime., Disp: 30 tablet, Rfl: 0   Baclofen 5 MG TABS, Take 1 tablet by mouth 2 (two) times daily., Disp: , Rfl:    busPIRone (BUSPAR) 10 MG  tablet, Take 1 tablet (10 mg total) by mouth 2 (two) times daily., Disp: 180 tablet, Rfl: 1   cholecalciferol (VITAMIN D) 1000 units tablet, Take 1,000 Units by mouth  daily., Disp: , Rfl:    DULoxetine (CYMBALTA) 60 MG capsule, Take 1 capsule (60 mg total) by mouth every morning., Disp: 90 capsule, Rfl: 3   FLUAD QUADRIVALENT 0.5 ML injection, , Disp: , Rfl:    gabapentin (NEURONTIN) 300 MG capsule, Take 300 mg by mouth 2 (two) times daily., Disp: , Rfl:    HYDROcodone-acetaminophen (NORCO/VICODIN) 5-325 MG tablet, Take by mouth. Takes 7.5-325 mg, Disp: , Rfl:    Lactulose 20 GM/30ML SOLN, Take 15 mLs (10 g total) by mouth every 12 (twelve) hours as needed., Disp: 450 mL, Rfl: 0   letrozole (FEMARA) 2.5 MG tablet, Take 1 tablet (2.5 mg total) by mouth daily., Disp: 90 tablet, Rfl: 1   levothyroxine (SYNTHROID) 100 MCG tablet, Take 1 tablet by mouth daily., Disp: , Rfl:    MODERNA COVID-19 BIVAL BOOSTER 50 MCG/0.5ML injection, , Disp: , Rfl:    MODERNA COVID-19 VACCINE 100 MCG/0.5ML injection, , Disp: , Rfl:    ondansetron (ZOFRAN) 4 MG tablet, Take 1 tablet (4 mg total) by mouth every 6 (six) hours as needed for nausea or vomiting., Disp: 30 tablet, Rfl: 0   pantoprazole (PROTONIX) 40 MG tablet, Take 1 tablet (40 mg total) by mouth 2 (two) times daily., Disp: 60 tablet, Rfl: 0   traZODone (DESYREL) 100 MG tablet, TAKE 1 TABLET BY MOUTH EVERYDAY AT BEDTIME, Disp: 90 tablet, Rfl: 3   triamcinolone cream (KENALOG) 0.1 %, SMARTSIG:1 Application Topical 2-3 Times Daily, Disp: , Rfl:    Vibegron (GEMTESA) 75 MG TABS, Take 1 tablet (75 mg total) by mouth daily., Disp: 30 tablet, Rfl: 11   vitamin B-12 (CYANOCOBALAMIN) 500 MCG tablet, Take 500 mcg by mouth daily. , Disp: , Rfl:   Physical exam:  Vitals:   06/08/23 1033  BP: (!) 127/49  Pulse: 92  Resp: 18  Temp: 98 F (36.7 C)  TempSrc: Tympanic  SpO2: 99%  Weight: 164 lb (74.4 kg)  Height: 5\' 6"  (1.676 m)   Physical Exam Cardiovascular:      Rate and Rhythm: Normal rate and regular rhythm.     Heart sounds: Normal heart sounds.  Pulmonary:     Effort: Pulmonary effort is normal.     Breath sounds: Normal breath sounds.  Skin:    General: Skin is warm and dry.  Neurological:     Mental Status: She is alert and oriented to person, place, and time.     Comments: There is focal tenderness to palpation over L2-L3 vertebral body region      I have personally reviewed labs listed below:    Latest Ref Rng & Units 04/24/2023    9:53 AM  CMP  Glucose 70 - 99 mg/dL 99   BUN 8 - 23 mg/dL 13   Creatinine 1.61 - 1.00 mg/dL 0.96   Sodium 045 - 409 mmol/L 136   Potassium 3.5 - 5.1 mmol/L 3.8   Chloride 98 - 111 mmol/L 103   CO2 22 - 32 mmol/L 25   Calcium 8.9 - 10.3 mg/dL 8.5   Total Protein 6.5 - 8.1 g/dL 6.8   Total Bilirubin 0.0 - 1.2 mg/dL 0.7   Alkaline Phos 38 - 126 U/L 54   AST 15 - 41 U/L 21   ALT 0 - 44 U/L 15       Latest Ref Rng & Units 04/24/2023    9:53 AM  CBC  WBC 4.0 - 10.5 K/uL 6.4   Hemoglobin 12.0 - 15.0 g/dL 12.0  Hematocrit 36.0 - 46.0 % 37.8   Platelets 150 - 400 K/uL 220      Assessment and plan- Patient is a 83 y.o. female  with history of low-volume metastatic ER positive breast cancer with mets to pleura and lymph nodes.   Patient has been on single agent letrozole for her low-volume ER positive metastatic breast cancer.  CT chest abdomen and pelvis with contrast in February 2025 showed no evidence of recurrent or progressive disease but did show compression fracture of the L2 vertebral body which could be cause of her worsening back pain.  This is probably related to osteoporosis unrelated to her breast cancer.  I did discuss her case with Dr. Baldemar Lenis from interventional radiology and initially the plan was to get MRI of her lumbar spine to assess the area better.  However patient has cochlear implants which are not compatible with MRI.  I have discussed all this with her in detail and she does not  wish to proceed with any interventions such as possible kyphoplasty at this time.  I have encouraged her to speak to pain management clinic if she desires any adjustment to her pain medications.  I will see her back in 6 months with bone density scan prior with labs CBC with differential CMP and CA 27-29   Visit Diagnosis 1. Malignant neoplasm of female breast, unspecified estrogen receptor status, unspecified laterality, unspecified site of breast (HCC)   2. Osteopenia of lumbar spine   3. Primary malignant neoplasm of breast with metastasis (HCC)      Dr. Owens Shark, MD, MPH [facility] at [facility] [phone] 06/11/2023 2:28 PM

## 2023-06-11 NOTE — Telephone Encounter (Signed)
 I spoke to Smith Robert and she reached out to Dr. Dorothea Glassman and he wants a MRI to get better of the L spine and I asked the pt . Is that ok to do. She states that she cochlear implants and she has magnets in each side and was told that she can never get a MRI ever. I spoke to Smith Robert and she will take it from here.

## 2023-06-12 DIAGNOSIS — M48062 Spinal stenosis, lumbar region with neurogenic claudication: Secondary | ICD-10-CM | POA: Diagnosis not present

## 2023-06-12 DIAGNOSIS — Z79899 Other long term (current) drug therapy: Secondary | ICD-10-CM | POA: Diagnosis not present

## 2023-06-12 DIAGNOSIS — M439 Deforming dorsopathy, unspecified: Secondary | ICD-10-CM | POA: Diagnosis not present

## 2023-06-12 DIAGNOSIS — M5416 Radiculopathy, lumbar region: Secondary | ICD-10-CM | POA: Diagnosis not present

## 2023-06-25 DIAGNOSIS — E034 Atrophy of thyroid (acquired): Secondary | ICD-10-CM | POA: Diagnosis not present

## 2023-07-03 ENCOUNTER — Other Ambulatory Visit: Payer: Self-pay | Admitting: Psychiatry

## 2023-07-03 DIAGNOSIS — F330 Major depressive disorder, recurrent, mild: Secondary | ICD-10-CM

## 2023-07-03 DIAGNOSIS — F4310 Post-traumatic stress disorder, unspecified: Secondary | ICD-10-CM

## 2023-07-18 DIAGNOSIS — H353211 Exudative age-related macular degeneration, right eye, with active choroidal neovascularization: Secondary | ICD-10-CM | POA: Diagnosis not present

## 2023-07-18 DIAGNOSIS — M48062 Spinal stenosis, lumbar region with neurogenic claudication: Secondary | ICD-10-CM | POA: Diagnosis not present

## 2023-07-18 DIAGNOSIS — H353221 Exudative age-related macular degeneration, left eye, with active choroidal neovascularization: Secondary | ICD-10-CM | POA: Diagnosis not present

## 2023-07-18 DIAGNOSIS — M5416 Radiculopathy, lumbar region: Secondary | ICD-10-CM | POA: Diagnosis not present

## 2023-07-18 DIAGNOSIS — Z79899 Other long term (current) drug therapy: Secondary | ICD-10-CM | POA: Diagnosis not present

## 2023-07-19 ENCOUNTER — Ambulatory Visit: Admitting: Psychiatry

## 2023-07-19 ENCOUNTER — Encounter: Payer: Self-pay | Admitting: Psychiatry

## 2023-07-19 VITALS — BP 126/62 | HR 77 | Temp 98.5°F | Ht 64.75 in | Wt 167.0 lb

## 2023-07-19 DIAGNOSIS — F330 Major depressive disorder, recurrent, mild: Secondary | ICD-10-CM | POA: Diagnosis not present

## 2023-07-19 DIAGNOSIS — G3184 Mild cognitive impairment, so stated: Secondary | ICD-10-CM

## 2023-07-19 DIAGNOSIS — F5101 Primary insomnia: Secondary | ICD-10-CM | POA: Diagnosis not present

## 2023-07-19 DIAGNOSIS — F4310 Post-traumatic stress disorder, unspecified: Secondary | ICD-10-CM

## 2023-07-19 MED ORDER — BUSPIRONE HCL 10 MG PO TABS: 10.0000 mg | ORAL_TABLET | Freq: Three times a day (TID) | ORAL | 0 refills | Status: DC

## 2023-07-19 NOTE — Progress Notes (Signed)
 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  Patient reports she has been experiencing excessive anxiety, mood lability since the past several weeks.  She reports she has had a couple of incidents when she got tearful when she encountered situational stressors that normally would not affect her.  She does report a lot of stressors ongoing especially medical changes.  She has been progressively losing her vision and is currently under the care of her eye provider.  That does worry her.  She also reports neck and back pain ongoing since the past several months.  She is also currently dealing with abnormal thyroid  labs, she has to go back to her primary care provider for repeat labs for medication dosage adjustment of her levothyroxine .  Most recent free T4-1.19-elevated.  She appeared to be alert, oriented to person place time situation.  Denies any significant memory problems.  Attention and focus seem to be good in session today.  She was able to do digits forward and backward.  3 word memory immediate 3 out of 3 after 5 minutes 3 out of 3.  She denies any suicidality, homicidality or perceptual disturbances.  Currently compliant on her medications including Cymbalta , BuSpar .  She took an extra dosage of BuSpar  recently when she felt anxious and distressed by situational  challenges and that helped.  She is interested in dosage increase.  Denies any other concerns today.  Visit Diagnosis:    ICD-10-CM   1. MDD (major depressive disorder), recurrent episode, mild (HCC)  F33.0     2. Post traumatic stress disorder (PTSD)  F43.10 busPIRone  (BUSPAR ) 10 MG tablet    3. Primary insomnia  F51.01     4. Mild cognitive impairment  G31.84       Past Psychiatric History: I have reviewed past psychiatric history from progress note on 04/11/2021.  Past Medical History:  Past Medical History:  Diagnosis Date   Anxiety    Atrial fibrillation (HCC)    Breast cancer (HCC)    Breast cancer, right (HCC) 1999   RT MASTECTOMY and chemo tx's.    DDD (degenerative disc disease), cervical    hands and back with arthritis   Depression    Diverticulosis    Diverticulosis    DVT of lower extremity (deep venous thrombosis) (HCC)    Dyspnea    Family history of colon cancer    Family history of lung cancer    Family history of throat cancer    GERD (gastroesophageal reflux disease)    Goiter    Headache    History of chemotherapy 2000   BREAST CA   Hypertension    Incontinence    Neuromuscular disorder (HCC)    peipheral neuropathy   OSA (obstructive sleep apnea)    Osteoporosis    Personal history of chemotherapy 1999   BREAST CA   Status post chemotherapy    2000 right  breast cancer   Thyroid  disease    UTI (urinary tract infection)    Vertigo     Past Surgical History:  Procedure Laterality Date   ABDOMINAL HYSTERECTOMY     BREAST BIOPSY Left 2006   negative   BUNIONECTOMY Bilateral    Screws implanted.   CHOLECYSTECTOMY     COCHLEAR IMPLANT Right    COLONOSCOPY WITH PROPOFOL  N/A 07/24/2016   Procedure: COLONOSCOPY WITH PROPOFOL ;  Surgeon: Cassie Click, MD;  Location: [facility] ENDOSCOPY;  Service: Endoscopy;  Laterality: N/A;   ESOPHAGOGASTRODUODENOSCOPY (EGD) WITH PROPOFOL  N/A 04/12/2016   Procedure: ESOPHAGOGASTRODUODENOSCOPY (EGD) WITH  PROPOFOL ;  Surgeon: Cassie Click, MD;  Location: [facility] ENDOSCOPY;  Service: Endoscopy;  Laterality: N/A;   ESOPHAGOGASTRODUODENOSCOPY (EGD) WITH PROPOFOL  N/A 05/03/2022   Procedure: ESOPHAGOGASTRODUODENOSCOPY (EGD) WITH PROPOFOL ;  Surgeon: Toledo, Alphonsus Jeans, MD;  Location: [facility] ENDOSCOPY;  Service: Gastroenterology;  Laterality: N/A;   EYE SURGERY Bilateral    cataract   FOOT SURGERY Bilateral    HAND SURGERY Right    KYPHOPLASTY N/A 05/20/2020   Procedure: L3 KYPHOPLASTY;  Surgeon: Molli Angelucci, MD;  Location: [facility] ORS;  Service: Orthopedics;  Laterality: N/A;   MASTECTOMY Right 1999   BREAST CA   MASTECTOMY Right 1999   PARTIAL COLECTOMY     THYROIDECTOMY     TOTAL HIP ARTHROPLASTY Left 03/19/2018   Procedure: TOTAL HIP ARTHROPLASTY LEFT;  Surgeon: Molli Angelucci, MD;  Location: [facility] ORS;  Service: Orthopedics;  Laterality: Left;   VIDEO ASSISTED THORACOSCOPY Left 09/10/2017   Procedure: VIDEO ASSISTED THORACOSCOPY;  Surgeon: Petra Brandy, MD;  Location: [facility] ORS;  Service: Thoracic;  Laterality: Left;  with biopsies   VIDEO BRONCHOSCOPY  09/10/2017   Procedure: VIDEO BRONCHOSCOPY;  Surgeon: Petra Brandy, MD;  Location: [facility] ORS;  Service: Thoracic;;    Family Psychiatric History: I have reviewed family psychiatric history from progress note on 04/11/2021.  Family History:  Family History  Problem Relation Age of Onset   Dementia Mother    Dementia Sister    Diabetes Sister    Heart attack Brother 36   COPD Brother    Lung cancer Brother        hx smoking   Colon cancer Brother    Liver cancer Brother    COPD Brother    Lung cancer Brother        hx smoking   Colon cancer Maternal Grandfather        dx >.50   Breast cancer Neg Hx     Social History: I have reviewed social history from progress note on 04/11/2021. Social History   Socioeconomic History   Marital status: Married    Spouse name: evert   Number of children: 4   Years of education: Not on file   Highest  education level: 9th grade  Occupational History   Not on file  Tobacco Use   Smoking status: Never   Smokeless tobacco: Never  Vaping Use   Vaping status: Never Used  Substance and Sexual Activity   Alcohol  use: No    Alcohol /week: 0.0 standard drinks of alcohol    Drug use: No   Sexual activity: Not Currently  Other Topics Concern   Not on file  Social History Narrative   Not on file   Social Drivers of Health   Financial Resource Strain: Low Risk  (12/04/2022)   Received from [facility]   Overall Financial Resource Strain (CARDIA)    Difficulty of Paying Living  Expenses: Not hard at all  Food Insecurity: No Food Insecurity (12/04/2022)   Received from [facility]   Hunger Vital Sign    Worried About Running Out of Food in the Last Year: Never true    Ran Out of Food in the Last Year: Never true  Transportation Needs: No Transportation Needs (12/04/2022)   Received from [facility] - Transportation    In the past 12 months, has lack of transportation kept you from medical appointments or from getting medications?: No    Lack of Transportation (Non-Medical): No  Physical Activity: Inactive (09/24/2017)   Exercise Vital Sign    Days of Exercise per Week: 0 days    Minutes of Exercise per Session: 0 min  Stress: No Stress Concern Present (09/24/2017)   [employer] of Occupational Health - Occupational Stress Questionnaire    Feeling of Stress : Not at all  Social Connections: Unknown (09/24/2017)   Social Connection and Isolation Panel [NHANES]    Frequency of Communication with Friends and Family: Not on file    Frequency of Social Gatherings with Friends and Family: Not on file    Attends Religious Services: More than 4 times per year    Active Member of [employer] or Organizations: No    Attends Banker Meetings: Never    Marital Status: Married    Allergies:  Allergies  Allergen Reactions    Imipramine Tinitus   Latex Rash   Penicillins Rash   Tape Rash    Metabolic Disorder Labs: No results found for: "HGBA1C", "MPG" No results found for: "PROLACTIN" No results found for: "CHOL", "TRIG", "HDL", "CHOLHDL", "VLDL", "LDLCALC" Lab Results  Component Value Date   TSH 0.275 (L) 11/09/2012   TSH 0.443 (L) 10/12/2012    Therapeutic Level Labs: No results found for: "LITHIUM" No results found for: "VALPROATE" No results found for: "CBMZ"  Current Medications: Current Outpatient Medications  Medication Sig Dispense Refill   acetaminophen  (TYLENOL ) 325 MG tablet Take 650 mg by mouth every 4 (four) hours as needed. For pain / increased temp.  May be administered orally,  per G-Tube if needed or rectally if unable to swallow (separate order).  Maximum dose for 24 hours is 3,000 mg from all sources of Acetaminophen  / Tylenol      atorvastatin  (LIPITOR) 40 MG tablet Take 1 tablet (40 mg total) by mouth at bedtime. 30 tablet 0   Baclofen 5 MG TABS Take 1 tablet by mouth 2 (two) times daily.     cholecalciferol  (VITAMIN D ) 1000 units tablet Take 1,000 Units by mouth daily.     DULoxetine  (CYMBALTA ) 60 MG capsule Take 1 capsule (60 mg total) by mouth every morning. 90 capsule 3   FLUAD QUADRIVALENT 0.5 ML injection      gabapentin  (NEURONTIN ) 300 MG capsule Take 300 mg by mouth 2 (two) times daily.     HYDROcodone -acetaminophen  (NORCO/VICODIN) 5-325 MG tablet Take by mouth. Takes 7.5-325 mg     ibandronate  (BONIVA ) 150 MG tablet Take 1 tablet (150 mg total) by mouth every 30 (thirty) days. Take in the morning with a full glass of water, on an empty stomach, and do not take anything else by mouth or lie down for the next 30 min. 3 tablet 3   Lactulose  20 GM/30ML SOLN Take 15 mLs (10 g total) by mouth every 12 (twelve) hours as needed. 450 mL 0   letrozole  (FEMARA ) 2.5 MG tablet Take 1  tablet (2.5 mg total) by mouth daily. 90 tablet 1   MODERNA COVID-19 BIVAL BOOSTER 50 MCG/0.5ML  injection      MODERNA COVID-19 VACCINE 100 MCG/0.5ML injection      ondansetron  (ZOFRAN ) 4 MG tablet Take 1 tablet (4 mg total) by mouth every 6 (six) hours as needed for nausea or vomiting. 30 tablet 0   pantoprazole  (PROTONIX ) 40 MG tablet Take 1 tablet (40 mg total) by mouth 2 (two) times daily. 60 tablet 0   traZODone  (DESYREL ) 100 MG tablet TAKE 1 TABLET BY MOUTH EVERYDAY AT BEDTIME 90 tablet 3   triamcinolone  cream (KENALOG ) 0.1 % SMARTSIG:1 Application Topical 2-3 Times Daily     Vibegron  (GEMTESA ) 75 MG TABS Take 1 tablet (75 mg total) by mouth daily. 30 tablet 11   vitamin B-12 (CYANOCOBALAMIN ) 500 MCG tablet Take 500 mcg by mouth daily.      busPIRone  (BUSPAR ) 10 MG tablet Take 1 tablet (10 mg total) by mouth 3 (three) times daily. 270 tablet 0   levothyroxine  (SYNTHROID ) 100 MCG tablet Take 1 tablet by mouth daily.     No current facility-administered medications for this visit.     Musculoskeletal: Strength & Muscle Tone: within normal limits Gait & Station: waks with a cane Patient leans: N/A  Psychiatric Specialty Exam: Review of Systems  Psychiatric/Behavioral:  Positive for dysphoric mood. The patient is nervous/anxious.     Blood pressure 126/62, pulse 77, temperature 98.5 F (36.9 C), temperature source Oral, height 5' 4.75" (1.645 m), weight 167 lb (75.8 kg), SpO2 95%.Body mass index is 28.01 kg/m.  General Appearance: Casual  Eye Contact:  Fair  Speech:  Clear and Coherent  Volume:  Normal  Mood:  Anxious and Depressed  Affect:  Congruent  Thought Process:  Goal Directed and Descriptions of Associations: Intact  Orientation:  Full (Time, Place, and Person)  Thought Content: Logical   Suicidal Thoughts:  No  Homicidal Thoughts:  No  Memory:  Immediate;   Fair Recent;   Fair Remote;   Fair  Judgement:  Fair  Insight:  Fair  Psychomotor Activity:  Normal  Concentration:  Concentration: Fair and Attention Span: Fair  Recall:  [employer] of Knowledge:  Fair  Language: Fair  Akathisia:  No  Handed:  Right  AIMS (if indicated): not done  Assets:  Manufacturing systems engineer Desire for Improvement Housing Social Support Transportation  ADL's:  Intact  Cognition: WNL  Sleep:  Fair   Screenings: GAD-7    Garment/textile technologist Visit from 11/15/2022 in [facility] Office Visit from 06/12/2022 in [facility]  Total GAD-7 Score 2 6      PHQ2-9    Flowsheet Row Office Visit from 07/19/2023 in [facility] Office Visit from 11/15/2022 in [facility] Office Visit from 06/12/2022 in [facility] Office Visit from 11/16/2021 in [facility] Office Visit from 04/11/2021 in [facility]  PHQ-2 Total Score 3 0 0 0 2  PHQ-9 Total Score 8 -- 1 5 4       Flowsheet Row Office Visit from 07/19/2023 in [facility] Office Visit from 11/15/2022 in [facility] ED from 08/04/2022 in [facility] Department at [facility]  C-SSRS RISK CATEGORY No Risk No Risk No Risk  Assessment and Plan: Breanna Zhang is a 83 year old Caucasian female, married, currently struggling with multiple situational stressors, physical complaints including pain, thyroid  abnormalities which does have an impact on her mood, discussed assessment and plan as noted below.  MDD-unstable Currently struggling with worsening mood symptoms.  Agreeable to dosage increase of BuSpar . - Continue Cymbalta  60 mg daily - Increase BuSpar  to 10 mg 3 times a day.  PTSD-stable Currently denies any PTSD related symptoms including intrusive memories flashbacks, nightmares. -Continue Cymbalta  as prescribed  Insomnia-stable Currently  reports sleep is good. - Continue Trazodone  100 mg at bedtime - Continue sleep hygiene techniques  Mild cognitive impairment-chronic-did not seem to have any changes in her memory based on evaluation today.  She was able to answer all questions appropriately and was able to do Mini cog test successfully. Patient will need to continue to follow up with primary care provider for management of her thyroid  abnormalities which does have an impact on her mood as well as memory.  Follow-up in clinic in 1 month or sooner if needed.   Collaboration of Care: Collaboration of Care: Primary Care Provider AEB encouraged to follow up with primary care provider.  Patient/Guardian was advised Release of Information must be obtained prior to any record release in order to collaborate their care with an outside provider. Patient/Guardian was advised if they have not already done so to contact the registration department to sign all necessary forms in order for us  to release information regarding their care.   Consent: Patient/Guardian gives verbal consent for treatment and assignment of benefits for services provided during this visit. Patient/Guardian expressed understanding and agreed to proceed.  This note was generated in part or whole with voice recognition software. Voice recognition is usually quite accurate but there are transcription errors that can and very often do occur. I apologize for any typographical errors that were not detected and corrected.     Anjelina Dung, MD 07/19/2023, 12:32 PM

## 2023-07-26 DIAGNOSIS — E039 Hypothyroidism, unspecified: Secondary | ICD-10-CM | POA: Diagnosis not present

## 2023-07-31 ENCOUNTER — Encounter (INDEPENDENT_AMBULATORY_CARE_PROVIDER_SITE_OTHER): Payer: Self-pay

## 2023-09-20 ENCOUNTER — Ambulatory Visit: Admitting: Psychiatry

## 2023-09-20 ENCOUNTER — Other Ambulatory Visit: Payer: Self-pay

## 2023-09-20 ENCOUNTER — Encounter: Payer: Self-pay | Admitting: Psychiatry

## 2023-09-20 VITALS — BP 123/78 | HR 78 | Temp 97.3°F | Ht 64.75 in | Wt 164.8 lb

## 2023-09-20 DIAGNOSIS — F5101 Primary insomnia: Secondary | ICD-10-CM | POA: Diagnosis not present

## 2023-09-20 DIAGNOSIS — F330 Major depressive disorder, recurrent, mild: Secondary | ICD-10-CM | POA: Diagnosis not present

## 2023-09-20 DIAGNOSIS — F419 Anxiety disorder, unspecified: Secondary | ICD-10-CM

## 2023-09-20 DIAGNOSIS — F4310 Post-traumatic stress disorder, unspecified: Secondary | ICD-10-CM | POA: Diagnosis not present

## 2023-09-20 DIAGNOSIS — G3184 Mild cognitive impairment, so stated: Secondary | ICD-10-CM

## 2023-09-20 MED ORDER — TRAZODONE HCL 150 MG PO TABS: 150.0000 mg | ORAL_TABLET | Freq: Every day | ORAL | 0 refills | Status: DC

## 2023-09-20 MED ORDER — HYDROXYZINE HCL 10 MG PO TABS: 10.0000 mg | ORAL_TABLET | Freq: Two times a day (BID) | ORAL | 1 refills | Status: DC | PRN
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
## 2023-09-20 NOTE — Patient Instructions (Signed)
 Start Taking trazodone  150 mg daily at bedtime for sleep. Please stop trazodone  100 mg daily.  Your dosage has been increased.  Start taking hydroxyzine  10 mg twice a day as needed, use it only as needed if you have a lot of anxiety symptoms.  Please see below for medication information.  Please start talking to a counselor, as soon as possible.   Hydroxyzine  Capsules or Tablets What is this medication? HYDROXYZINE  (hye DROX i zeen) treats the symptoms of allergies and allergic reactions. It may also be used to treat anxiety or cause drowsiness before a procedure. It works by blocking histamine, a substance released by the body during an allergic reaction. It belongs to a group of medications called antihistamines. This medicine may be used for other purposes; ask your health care provider or pharmacist if you have questions. COMMON BRAND NAME(S): ANX, Atarax , Rezine, Vistaril  What should I tell my care team before I take this medication? They need to know if you have any of these conditions: Glaucoma Heart disease Irregular heartbeat or rhythm Kidney disease Liver disease Lung or breathing disease, such as asthma Stomach or intestine problems Thyroid  disease Trouble passing urine An unusual or allergic reaction to hydroxyzine , other medications, foods, dyes or preservatives Pregnant or trying to get pregnant Breastfeeding How should I use this medication? Take this medication by mouth with a full glass of water. Take it as directed on the prescription label at the same time every day. You can take it with or without food. If it upsets your stomach, take it with food. Talk to your care team about the use of this medication in children. While it may be prescribed for children as young as 6 years for selected conditions, precautions do apply. People 65 years and older may have a stronger reaction and need a smaller dose. Overdosage: If you think you have taken too much of this medicine  contact a poison control center or emergency room at once. NOTE: This medicine is only for you. Do not share this medicine with others. What if I miss a dose? If you miss a dose, take it as soon as you can. If it is almost time for your next dose, take only that dose. Do not take double or extra doses. What may interact with this medication? Do not take this medication with any of the following: Cisapride Dronedarone Pimozide Thioridazine This medication may also interact with the following: Alcohol  Antihistamines for allergy, cough, and cold Atropine Barbiturate medications for sleep or seizures, such as phenobarbital Certain antibiotics, such as erythromycin or clarithromycin Certain medications for anxiety or sleep Certain medications for bladder problems, such as oxybutynin or tolterodine Certain medications for irregular heartbeat Certain medications for mental health conditions Certain medications for Parkinson disease, such as benztropine, trihexyphenidyl Certain medications for seizures, such as phenobarbital or primidone Certain medications for stomach problems, such as dicyclomine or hyoscyamine Certain medications for travel sickness, such as scopolamine Ipratropium Opioid medications for pain Other medications that cause heart rhythm changes, such as dofetilide This list may not describe all possible interactions. Give your health care provider a list of all the medicines, herbs, non-prescription drugs, or dietary supplements you use. Also tell them if you smoke, drink alcohol , or use illegal drugs. Some items may interact with your medicine. What should I watch for while using this medication? Visit your care team for regular checks on your progress. Tell your care team if your symptoms do not start to get better or if  they get worse. This medication may affect your coordination, reaction time, or judgment. Do not drive or operate machinery until you know how this  medication affects you. Sit up or stand slowly to reduce the risk of dizzy or fainting spells. Drinking alcohol  with this medication can increase the risk of these side effects. Your mouth may get dry. Chewing sugarless gum or sucking hard candy and drinking plenty of water may help. Contact your care team if the problem does not go away or is severe. This medication may cause dry eyes and blurred vision. If you wear contact lenses, you may feel some discomfort. Lubricating eye drops may help. See your care team if the problem does not go away or is severe. If you are receiving skin tests for allergies, tell your care team you are taking this medication. What side effects may I notice from receiving this medication? Side effects that you should report to your care team as soon as possible: Allergic reactions--skin rash, itching, hives, swelling of the face, lips, tongue, or throat Heart rhythm changes--fast or irregular heartbeat, dizziness, feeling faint or lightheaded, chest pain, trouble breathing Side effects that usually do not require medical attention (report to your care team if they continue or are bothersome): Confusion Drowsiness Dry mouth Hallucinations Headache This list may not describe all possible side effects. Call your doctor for medical advice about side effects. You may report side effects to FDA at 1-800-FDA-1088. Where should I keep my medication? Keep out of the reach of children and pets. Store at room temperature between 15 and 30 degrees C (59 and 86 degrees F). Keep container tightly closed. Throw away any unused medication after the expiration date. NOTE: This sheet is a summary. It may not cover all possible information. If you have questions about this medicine, talk to your doctor, pharmacist, or health care provider.  2024 Elsevier/Gold Standard (2021-10-07 00:00:00)

## 2023-10-01 DIAGNOSIS — E039 Hypothyroidism, unspecified: Secondary | ICD-10-CM | POA: Diagnosis not present

## 2023-10-01 DIAGNOSIS — I4891 Unspecified atrial fibrillation: Secondary | ICD-10-CM | POA: Diagnosis not present

## 2023-10-01 DIAGNOSIS — E785 Hyperlipidemia, unspecified: Secondary | ICD-10-CM | POA: Diagnosis not present

## 2023-10-01 DIAGNOSIS — G4700 Insomnia, unspecified: Secondary | ICD-10-CM | POA: Diagnosis not present

## 2023-10-01 DIAGNOSIS — F419 Anxiety disorder, unspecified: Secondary | ICD-10-CM | POA: Diagnosis not present

## 2023-10-01 DIAGNOSIS — H353231 Exudative age-related macular degeneration, bilateral, with active choroidal neovascularization: Secondary | ICD-10-CM | POA: Diagnosis not present

## 2023-10-01 DIAGNOSIS — F4310 Post-traumatic stress disorder, unspecified: Secondary | ICD-10-CM | POA: Diagnosis not present

## 2023-10-01 DIAGNOSIS — F330 Major depressive disorder, recurrent, mild: Secondary | ICD-10-CM | POA: Diagnosis not present

## 2023-10-01 DIAGNOSIS — G63 Polyneuropathy in diseases classified elsewhere: Secondary | ICD-10-CM | POA: Diagnosis not present

## 2023-10-01 DIAGNOSIS — E663 Overweight: Secondary | ICD-10-CM | POA: Diagnosis not present

## 2023-10-01 DIAGNOSIS — D6869 Other thrombophilia: Secondary | ICD-10-CM | POA: Diagnosis not present

## 2023-10-01 DIAGNOSIS — I739 Peripheral vascular disease, unspecified: Secondary | ICD-10-CM | POA: Diagnosis not present

## 2023-10-17 DIAGNOSIS — H353231 Exudative age-related macular degeneration, bilateral, with active choroidal neovascularization: Secondary | ICD-10-CM | POA: Diagnosis not present

## 2023-10-22 ENCOUNTER — Ambulatory Visit: Payer: PPO | Admitting: Oncology

## 2023-10-22 ENCOUNTER — Other Ambulatory Visit: Payer: PPO

## 2023-11-01 DIAGNOSIS — M48062 Spinal stenosis, lumbar region with neurogenic claudication: Secondary | ICD-10-CM | POA: Diagnosis not present

## 2023-11-01 DIAGNOSIS — M5416 Radiculopathy, lumbar region: Secondary | ICD-10-CM | POA: Diagnosis not present

## 2023-11-01 DIAGNOSIS — Z79899 Other long term (current) drug therapy: Secondary | ICD-10-CM | POA: Diagnosis not present

## 2023-11-05 ENCOUNTER — Ambulatory Visit
Admission: RE | Admit: 2023-11-05 | Discharge: 2023-11-05 | Disposition: A | Discharge status: Home / Self Care | Payer: PPO | Source: Ambulatory Visit | Attending: Oncology | Admitting: Oncology

## 2023-11-05 DIAGNOSIS — M8588 Other specified disorders of bone density and structure, other site: Secondary | ICD-10-CM | POA: Diagnosis not present

## 2023-11-05 DIAGNOSIS — C50919 Malignant neoplasm of unspecified site of unspecified female breast: Secondary | ICD-10-CM | POA: Insufficient documentation

## 2023-11-05 DIAGNOSIS — M8589 Other specified disorders of bone density and structure, multiple sites: Secondary | ICD-10-CM | POA: Diagnosis not present

## 2023-11-05 DIAGNOSIS — Z780 Asymptomatic menopausal state: Secondary | ICD-10-CM | POA: Diagnosis not present

## 2023-11-13 ENCOUNTER — Inpatient Hospital Stay: Attending: Oncology

## 2023-11-13 ENCOUNTER — Inpatient Hospital Stay (HOSPITAL_BASED_OUTPATIENT_CLINIC_OR_DEPARTMENT_OTHER): Admitting: Nurse Practitioner

## 2023-11-13 ENCOUNTER — Encounter: Payer: Self-pay | Admitting: Oncology

## 2023-11-13 VITALS — BP 123/65 | HR 80 | Temp 97.3°F | Resp 16 | Wt 166.7 lb

## 2023-11-13 DIAGNOSIS — M810 Age-related osteoporosis without current pathological fracture: Secondary | ICD-10-CM | POA: Insufficient documentation

## 2023-11-13 DIAGNOSIS — Z853 Personal history of malignant neoplasm of breast: Secondary | ICD-10-CM | POA: Diagnosis not present

## 2023-11-13 DIAGNOSIS — Z1509 Genetic susceptibility to other malignant neoplasm: Secondary | ICD-10-CM | POA: Diagnosis not present

## 2023-11-13 DIAGNOSIS — C782 Secondary malignant neoplasm of pleura: Secondary | ICD-10-CM | POA: Diagnosis not present

## 2023-11-13 DIAGNOSIS — Z5181 Encounter for therapeutic drug level monitoring: Secondary | ICD-10-CM

## 2023-11-13 DIAGNOSIS — Z170 Estrogen receptor positive status [ER+]: Secondary | ICD-10-CM | POA: Diagnosis not present

## 2023-11-13 DIAGNOSIS — Z9011 Acquired absence of right breast and nipple: Secondary | ICD-10-CM | POA: Insufficient documentation

## 2023-11-13 DIAGNOSIS — Z79811 Long term (current) use of aromatase inhibitors: Secondary | ICD-10-CM | POA: Diagnosis not present

## 2023-11-13 DIAGNOSIS — Z9221 Personal history of antineoplastic chemotherapy: Secondary | ICD-10-CM | POA: Diagnosis not present

## 2023-11-13 DIAGNOSIS — C50919 Malignant neoplasm of unspecified site of unspecified female breast: Secondary | ICD-10-CM

## 2023-11-13 DIAGNOSIS — Z148 Genetic carrier of other disease: Secondary | ICD-10-CM | POA: Diagnosis not present

## 2023-11-13 DIAGNOSIS — Z1732 Human epidermal growth factor receptor 2 negative status: Secondary | ICD-10-CM | POA: Diagnosis not present

## 2023-11-13 DIAGNOSIS — C771 Secondary and unspecified malignant neoplasm of intrathoracic lymph nodes: Secondary | ICD-10-CM | POA: Insufficient documentation

## 2023-11-13 LAB — CMP (CANCER CENTER ONLY)
ALT: 14 U/L (ref 0–44)
AST: 25 U/L (ref 15–41)
Albumin: 3.6 g/dL (ref 3.5–5.0)
Alkaline Phosphatase: 54 U/L (ref 38–126)
Anion gap: 8 (ref 5–15)
BUN: 14 mg/dL (ref 8–23)
CO2: 23 mmol/L (ref 22–32)
Calcium: 8.3 mg/dL — ABNORMAL LOW (ref 8.9–10.3)
Chloride: 104 mmol/L (ref 98–111)
Creatinine: 0.87 mg/dL (ref 0.44–1.00)
GFR, Estimated: >60 mL/min (ref >60)
Glucose, Bld: 133 mg/dL — ABNORMAL HIGH (ref 70–99)
Potassium: 3.6 mmol/L (ref 3.5–5.1)
Sodium: 135 mmol/L (ref 135–145)
Total Bilirubin: 0.6 mg/dL (ref 0.0–1.2)
Total Protein: 6.7 g/dL (ref 6.5–8.1)

## 2023-11-13 LAB — CBC WITH DIFFERENTIAL (CANCER CENTER ONLY)
Abs Immature Granulocytes: 0.02 K/uL (ref 0.00–0.07)
Basophils Absolute: 0.1 K/uL (ref 0.0–0.1)
Basophils Relative: 1 %
Eosinophils Absolute: 0.1 K/uL (ref 0.0–0.5)
Eosinophils Relative: 2 %
HCT: 36.9 % (ref 36.0–46.0)
Hemoglobin: 11.9 g/dL — ABNORMAL LOW (ref 12.0–15.0)
Immature Granulocytes: 0 %
Lymphocytes Relative: 29 %
Lymphs Abs: 1.8 K/uL (ref 0.7–4.0)
MCH: 28.5 pg (ref 26.0–34.0)
MCHC: 32.2 g/dL (ref 30.0–36.0)
MCV: 88.5 fL (ref 80.0–100.0)
Monocytes Absolute: 0.6 K/uL (ref 0.1–1.0)
Monocytes Relative: 9 %
Neutro Abs: 3.6 K/uL (ref 1.7–7.7)
Neutrophils Relative %: 59 %
Platelet Count: 211 K/uL (ref 150–400)
RBC: 4.17 MIL/uL (ref 3.87–5.11)
RDW: 13.2 % (ref 11.5–15.5)
WBC Count: 6.1 K/uL (ref 4.0–10.5)
nRBC: 0 % (ref 0.0–0.2)

## 2023-11-13 MED ORDER — LACTULOSE 20 GM/30ML PO SOLN: 15.0000 mL | Freq: Two times a day (BID) | ORAL | 0 refills | Status: AC | PRN

## 2023-11-13 MED ORDER — ONDANSETRON HCL 4 MG PO TABS: 4.0000 mg | ORAL_TABLET | Freq: Four times a day (QID) | ORAL | 0 refills | Status: AC | PRN

## 2023-11-13 NOTE — Progress Notes (Signed)
 Pt in for follow up, reports having issues with nausea and constipation and is requesting refill on zofran  and lactulose .

## 2023-11-13 NOTE — Progress Notes (Signed)
 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  She underwent mastectomy followed by adjuvant chemotherapy and 5 years of tamoxifen.  Patient has been having some low back pain and underwent a CT lumbar spine without contrast on 08/10/2017 which was done by the pain clinic.  CT mainly showed age-related degenerative disease but also showed incidental nodular thickening of the posterior left diaphragm concerning for tumor and a CT chest was recommended.   CT chest on 08/14/2017 showed scattered left pleural nodularity with tiny left pleural effusion and prominent left internal mammary and juxtadiaphragmatic lymph nodes which are nonspecific but metastatic disease could not be ruled out.   This was followed by a PET/CT scan on 08/22/2017 which showed numerous small left-sided pleural nodules which were mildly hypermetabolic along with hypermetabolic mediastinal lymph nodes concerning for metastatic disease.  Small metastatic nodule in the left upper quadrant partly in the retrocrural space.  No findings of pulmonary metastatic or osseous metastatic disease.    Patient underwent FNA of retrocrural LN which was positive for carcinoma but primary could not be ascertained. She then  underwent pleural biopsy which showed metastatic carcinoma consistent with breast primary. ER+ HER-2.  Patient started taking Ibrance  in August 2019.  Ibrance  on hold since January 2021 with stable disease on letrozole    NGS testing showed PIKCA and BRCA mutation      Interval history- Breanna Zhang is a 83 y.o. female with low volume metastatic breast cancer, on letrozole , who returns to clinic for follow up. She continues letrozole . Tolerating well. Denies unintentional weight loss. Appetite is good. She has chronic hip and back pain. Has numbness of the feet which is stable. Requests refill of nausea medication which she takes infrequently. Has rare constipation that is unrelieved by OTCs and requests refill of lactulose .   ECOG PS- 2 Pain scale- 4 Opioid associated constipation- no  Review of systems- Review of Systems  Constitutional:  Positive for malaise/fatigue. Negative for chills, fever and weight loss.  HENT:  Negative for congestion, ear discharge and nosebleeds.   Eyes:  Negative for blurred vision.  Respiratory:  Negative for cough, hemoptysis, sputum production, shortness of breath and wheezing.   Cardiovascular:  Negative for chest pain, palpitations, orthopnea and claudication.  Gastrointestinal:  Negative for abdominal pain, blood in stool, constipation, diarrhea, heartburn, melena, nausea and vomiting.  Genitourinary:  Negative for dysuria, flank pain, frequency, hematuria and urgency.  Musculoskeletal:  Positive for back pain. Negative for joint pain and myalgias.  Skin:  Negative for rash.  Neurological:  Negative for dizziness, tingling, focal weakness, seizures, weakness and headaches.  Endo/Heme/Allergies:  Does not bruise/bleed easily.  Psychiatric/Behavioral:  Negative for depression and suicidal ideas. The patient does not have insomnia.      Allergies  Allergen Reactions   Imipramine Tinitus  Latex Rash   Penicillins Rash   Tape Rash    Past Medical  History:  Diagnosis Date   Anxiety    Atrial fibrillation (HCC)    Breast cancer (HCC)    Breast cancer, right (HCC) 1999   RT MASTECTOMY and chemo tx's.    DDD (degenerative disc disease), cervical    hands and back with arthritis   Depression    Diverticulosis    Diverticulosis    DVT of lower extremity (deep venous thrombosis) (HCC)    Dyspnea    Family history of colon cancer    Family history of lung cancer    Family history of throat cancer    GERD (gastroesophageal reflux disease)    Goiter    Headache    History of chemotherapy 2000   BREAST CA   Hypertension    Incontinence    Neuromuscular disorder (HCC)    peipheral neuropathy   OSA (obstructive sleep apnea)    Osteoporosis    Personal history of chemotherapy 1999   BREAST CA   Status post chemotherapy    2000 right breast cancer   Thyroid  disease    UTI (urinary tract infection)    Vertigo     Past Surgical History:  Procedure Laterality Date   ABDOMINAL HYSTERECTOMY     BREAST BIOPSY Left 2006   negative   BUNIONECTOMY Bilateral    Screws implanted.   CHOLECYSTECTOMY     COCHLEAR IMPLANT Right    COLONOSCOPY WITH PROPOFOL  N/A 07/24/2016   Procedure: COLONOSCOPY WITH PROPOFOL ;  Surgeon: Viktoria Lamar DASEN, MD;  Location: [facility] ENDOSCOPY;  Service: Endoscopy;  Laterality: N/A;   ESOPHAGOGASTRODUODENOSCOPY (EGD) WITH PROPOFOL  N/A 04/12/2016   Procedure: ESOPHAGOGASTRODUODENOSCOPY (EGD) WITH PROPOFOL ;  Surgeon: Lamar DASEN Viktoria, MD;  Location: [facility] ENDOSCOPY;  Service: Endoscopy;  Laterality: N/A;   ESOPHAGOGASTRODUODENOSCOPY (EGD) WITH PROPOFOL  N/A 05/03/2022   Procedure: ESOPHAGOGASTRODUODENOSCOPY (EGD) WITH PROPOFOL ;  Surgeon: Toledo, Ladell POUR, MD;  Location: [facility] ENDOSCOPY;  Service: Gastroenterology;  Laterality: N/A;   EYE SURGERY Bilateral    cataract   FOOT SURGERY Bilateral    HAND SURGERY Right    KYPHOPLASTY N/A 05/20/2020   Procedure: L3 KYPHOPLASTY;  Surgeon: Kathlynn Sharper, MD;  Location: [facility]  ORS;  Service: Orthopedics;  Laterality: N/A;   MASTECTOMY Right 1999   BREAST CA   MASTECTOMY Right 1999   PARTIAL COLECTOMY     THYROIDECTOMY     TOTAL HIP ARTHROPLASTY Left 03/19/2018   Procedure: TOTAL HIP ARTHROPLASTY LEFT;  Surgeon: Kathlynn Sharper, MD;  Location: [facility] ORS;  Service: Orthopedics;  Laterality: Left;   VIDEO ASSISTED THORACOSCOPY Left 09/10/2017   Procedure: VIDEO ASSISTED THORACOSCOPY;  Surgeon: Volney Lye, MD;  Location: [facility] ORS;  Service: Thoracic;  Laterality: Left;  with biopsies   VIDEO BRONCHOSCOPY  09/10/2017   Procedure: VIDEO BRONCHOSCOPY;  Surgeon: Volney Lye, MD;  Location: [facility] ORS;  Service: Thoracic;;    Social History   Socioeconomic History   Marital status: Married    Spouse name: evert   Number of children: 4   Years of education: Not on file   Highest education level: 9th grade  Occupational History   Not on file  Tobacco Use   Smoking status: Never   Smokeless tobacco: Never  Vaping Use   Vaping status: Never Used  Substance and Sexual Activity   Alcohol  use: No    Alcohol /week: 0.0 standard drinks of alcohol    Drug use: No   Sexual activity:  Not Currently  Other Topics Concern   Not on file  Social History Narrative   Not on file   Social Drivers of Health   Financial Resource Strain: Low Risk  (12/04/2022)   Received from [facility]   Overall Financial Resource Strain (CARDIA)    Difficulty of Paying Living Expenses: Not hard at all  Food Insecurity: No Food Insecurity (12/04/2022)   Received from [facility]   Hunger Vital Sign    Within the past 12 months, you worried that your food would run out before you got the money to buy more.: Never true    Within the past 12 months, the food you bought just didn't last and you didn't have money to get more.: Never true  Transportation Needs: No Transportation Needs (12/04/2022)   Received from [facility] -  Transportation    In the past 12 months, has lack of transportation kept you from medical appointments or from getting medications?: No    Lack of Transportation (Non-Medical): No  Physical Activity: Inactive (09/24/2017)   Exercise Vital Sign    Days of Exercise per Week: 0 days    Minutes of Exercise per Session: 0 min  Stress: No Stress Concern Present (09/24/2017)   [employer] of Occupational Health - Occupational Stress Questionnaire    Feeling of Stress : Not at all  Social Connections: Unknown (09/24/2017)   Social Connection and Isolation Panel    Frequency of Communication with Friends and Family: Not on file    Frequency of Social Gatherings with Friends and Family: Not on file    Attends Religious Services: More than 4 times per year    Active Member of [employer] or Organizations: No    Attends Banker Meetings: Never    Marital Status: Married  Catering manager Violence: Not At Risk (09/24/2017)   Humiliation, Afraid, Rape, and Kick questionnaire    Fear of Current or Ex-Partner: No    Emotionally Abused: No    Physically Abused: No    Sexually Abused: No    Family History  Problem Relation Age of Onset   Dementia Mother    Dementia Sister    Diabetes Sister    Heart attack Brother 78   COPD Brother    Lung cancer Brother        hx smoking   Colon cancer Brother    Liver cancer Brother    COPD Brother    Lung cancer Brother        hx smoking   Colon cancer Maternal Grandfather        dx >.50   Breast cancer Neg Hx     Current Outpatient Medications:    acetaminophen  (TYLENOL ) 325 MG tablet, Take 650 mg by mouth every 4 (four) hours as needed. For pain / increased temp.  May be administered orally,  per G-Tube if needed or rectally if unable to swallow (separate order).  Maximum dose for 24 hours is 3,000 mg from all sources of Acetaminophen  / Tylenol , Disp: , Rfl:    atorvastatin  (LIPITOR) 40 MG tablet, Take 1 tablet (40 mg total) by mouth at  bedtime., Disp: 30 tablet, Rfl: 0   Baclofen 5 MG TABS, Take 1 tablet by mouth 2 (two) times daily., Disp: , Rfl:    busPIRone  (BUSPAR ) 10 MG tablet, Take 1 tablet (10 mg total) by mouth 3 (three) times daily., Disp: 270 tablet, Rfl: 0  cholecalciferol  (VITAMIN D ) 1000 units tablet, Take 1,000 Units by mouth daily., Disp: , Rfl:    DULoxetine  (CYMBALTA ) 60 MG capsule, Take 1 capsule (60 mg total) by mouth every morning., Disp: 90 capsule, Rfl: 3   FLUAD QUADRIVALENT 0.5 ML injection, , Disp: , Rfl:    gabapentin  (NEURONTIN ) 300 MG capsule, Take 300 mg by mouth 2 (two) times daily., Disp: , Rfl:    HYDROcodone -acetaminophen  (NORCO/VICODIN) 5-325 MG tablet, Take by mouth. Takes 7.5-325 mg, Disp: , Rfl:    hydrOXYzine  (ATARAX ) 10 MG tablet, Take 1 tablet (10 mg total) by mouth 2 (two) times daily as needed for anxiety., Disp: 60 tablet, Rfl: 1   ibandronate  (BONIVA ) 150 MG tablet, Take 1 tablet (150 mg total) by mouth every 30 (thirty) days. Take in the morning with a full glass of water, on an empty stomach, and do not take anything else by mouth or lie down for the next 30 min., Disp: 3 tablet, Rfl: 3   letrozole  (FEMARA ) 2.5 MG tablet, Take 1 tablet (2.5 mg total) by mouth daily., Disp: 90 tablet, Rfl: 1   levothyroxine  (SYNTHROID ) 100 MCG tablet, Take 1 tablet by mouth daily., Disp: , Rfl:    MODERNA COVID-19 BIVAL BOOSTER 50 MCG/0.5ML injection, , Disp: , Rfl:    MODERNA COVID-19 VACCINE 100 MCG/0.5ML injection, , Disp: , Rfl:    pantoprazole  (PROTONIX ) 40 MG tablet, Take 1 tablet (40 mg total) by mouth 2 (two) times daily., Disp: 60 tablet, Rfl: 0   traZODone  (DESYREL ) 150 MG tablet, Take 1 tablet (150 mg total) by mouth at bedtime. Stop the 100 mg daily, Disp: 90 tablet, Rfl: 0   triamcinolone  cream (KENALOG ) 0.1 %, SMARTSIG:1 Application Topical 2-3 Times Daily, Disp: , Rfl:    vitamin B-12 (CYANOCOBALAMIN ) 500 MCG tablet, Take 500 mcg by mouth daily. , Disp: , Rfl:    Lactulose  20 GM/30ML  SOLN, Take 15 mLs (10 g total) by mouth every 12 (twelve) hours as needed., Disp: 450 mL, Rfl: 0   ondansetron  (ZOFRAN ) 4 MG tablet, Take 1 tablet (4 mg total) by mouth every 6 (six) hours as needed for nausea or vomiting., Disp: 30 tablet, Rfl: 0   Vibegron  (GEMTESA ) 75 MG TABS, Take 1 tablet (75 mg total) by mouth daily. (Patient not taking: Reported on 11/13/2023), Disp: 30 tablet, Rfl: 11  Physical exam:  Vitals:   11/13/23 1036  BP: 123/65  Pulse: 80  Resp: 16  Temp: (!) 97.3 F (36.3 C)  TempSrc: Tympanic  SpO2: 99%  Weight: 166 lb 11.2 oz (75.6 kg)   Physical Exam Constitutional:      Appearance: She is not ill-appearing.  Cardiovascular:     Rate and Rhythm: Normal rate and regular rhythm.  Pulmonary:     Effort: No respiratory distress.     Breath sounds: No wheezing.  Abdominal:     General: There is no distension.     Tenderness: There is no abdominal tenderness.  Musculoskeletal:        General: No signs of injury.  Skin:    Coloration: Skin is not pale.  Neurological:     Mental Status: She is alert and oriented to person, place, and time.  Psychiatric:        Mood and Affect: Mood normal.        Behavior: Behavior normal.     I have personally reviewed labs listed below:    Latest Ref Rng & Units 11/13/2023   10:00  AM  CMP  Glucose 70 - 99 mg/dL 866   BUN 8 - 23 mg/dL 14   Creatinine 9.55 - 1.00 mg/dL 9.12   Sodium 864 - 854 mmol/L 135   Potassium 3.5 - 5.1 mmol/L 3.6   Chloride 98 - 111 mmol/L 104   CO2 22 - 32 mmol/L 23   Calcium  8.9 - 10.3 mg/dL 8.3   Total Protein 6.5 - 8.1 g/dL 6.7   Total Bilirubin 0.0 - 1.2 mg/dL 0.6   Alkaline Phos 38 - 126 U/L 54   AST 15 - 41 U/L 25   ALT 0 - 44 U/L 14       Latest Ref Rng & Units 11/13/2023    9:59 AM  CBC  WBC 4.0 - 10.5 K/uL 6.1   Hemoglobin 12.0 - 15.0 g/dL 88.0   Hematocrit 63.9 - 46.0 % 36.9   Platelets 150 - 400 K/uL 211     Assessment and plan- Patient is a 83 y.o. female who returns to  clinic for follow up of:   Low Volume Metastatic ER Positive breast cancer- metastases to pleura and lymph nodes. No evidence of osseous metastases. Tolerating letrozole  well. Clinically no evidence of progressive disease. CA 27-29 pending. Plan to repeat CT imaging in January 2026. Continue letrozole .  Osteopenia- bone density on 11/05/23.  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 Unable to have MRI d/t cochlear implants. She is followed by pain management and physiatry.   Disposition:  Jan ct c/a/p  6 mo- lab, Dr Melanee - [place]   Visit Diagnosis 1. Primary malignant neoplasm of breast with metastasis (HCC)   2. Encounter for monitoring aromatase inhibitor therapy    Tinnie Dawn, DNP, AGNP-C, [facility] [phone] (clinic) 11/13/2023

## 2023-11-14 LAB — CANCER ANTIGEN 27.29: CA 27.29: 43.5 U/mL — ABNORMAL HIGH (ref 0.0–38.6)

## 2023-11-22 ENCOUNTER — Ambulatory Visit: Admitting: Psychiatry

## 2023-11-26 ENCOUNTER — Other Ambulatory Visit: Payer: Self-pay | Admitting: Oncology

## 2023-11-26 ENCOUNTER — Telehealth: Payer: Self-pay

## 2023-11-26 ENCOUNTER — Telehealth: Payer: Self-pay | Admitting: *Deleted

## 2023-11-26 NOTE — Telephone Encounter (Signed)
 Noted

## 2023-11-26 NOTE — Telephone Encounter (Signed)
 Per Dr. Melanee Yes it is from her arthritis. She has seen ortho before.  Relayed message to spouse Guillermina that pain experienced is indeed arthritis.  No further follow up needed at this time.

## 2023-11-26 NOTE — Telephone Encounter (Signed)
 Okay to refill letrozole .  Also let her know that her bone density scan shows stable osteopenia and has not changed significantly as compared to her bone density that was done 2 years ago

## 2023-11-26 NOTE — Telephone Encounter (Signed)
 Medication management - Telephone call with patient, after she left a message she had come in contact with someone that tested positive for COVID this past Friday and wanted to know if she should reschedule her appt set for this Wednesday 11/28/23. Patient stated she did have a runny nose and I don't feel well.   Patient stated if okay she would prefer to reschedule the appointment for Wednesday as she could not do a virtual meeting.  Agreed to have our patient access staff call her back to reschedule and will let Dr. Coby know as as well.

## 2023-11-26 NOTE — Telephone Encounter (Signed)
 Yes it is from her arthritis. She has seen ortho before

## 2023-11-26 NOTE — Telephone Encounter (Signed)
 Voicemail received from patient 11/26/23 at 9:15am regarding refill request and bone density results;follow up with patient has already been completed.

## 2023-11-26 NOTE — Telephone Encounter (Signed)
 Per Dr. Melanee Maine to refill letrozole .  Also let her know that her bone density scan shows stable osteopenia and has not changed significantly as compared to her bone density that was done 2 years ago.  Outbound call to patient; informed of above.  Patient asked if you saw any changes to her right hip area; says it gives her a lot of trouble and didn't know if it was arthritis or what.  Informed I would follow up with Dr. Melanee and give her a call back; patient verbalized understanding.

## 2023-11-26 NOTE — Telephone Encounter (Signed)
 Patient wants a refill of letrozole  and outs and then twice and she wants the results of the bone density to check to tell the patient the numbers.

## 2023-11-28 ENCOUNTER — Ambulatory Visit: Admitting: Psychiatry

## 2023-11-29 ENCOUNTER — Ambulatory Visit: Admitting: Professional Counselor

## 2023-12-07 DIAGNOSIS — G4733 Obstructive sleep apnea (adult) (pediatric): Secondary | ICD-10-CM | POA: Diagnosis not present

## 2023-12-07 DIAGNOSIS — E785 Hyperlipidemia, unspecified: Secondary | ICD-10-CM | POA: Diagnosis not present

## 2023-12-07 DIAGNOSIS — C50919 Malignant neoplasm of unspecified site of unspecified female breast: Secondary | ICD-10-CM | POA: Diagnosis not present

## 2023-12-07 DIAGNOSIS — R7303 Prediabetes: Secondary | ICD-10-CM | POA: Diagnosis not present

## 2023-12-07 DIAGNOSIS — E034 Atrophy of thyroid (acquired): Secondary | ICD-10-CM | POA: Diagnosis not present

## 2023-12-07 DIAGNOSIS — C7802 Secondary malignant neoplasm of left lung: Secondary | ICD-10-CM | POA: Diagnosis not present

## 2023-12-07 DIAGNOSIS — I10 Essential (primary) hypertension: Secondary | ICD-10-CM | POA: Diagnosis not present

## 2023-12-11 ENCOUNTER — Ambulatory Visit (INDEPENDENT_AMBULATORY_CARE_PROVIDER_SITE_OTHER): Admitting: Professional Counselor

## 2023-12-11 DIAGNOSIS — F331 Major depressive disorder, recurrent, moderate: Secondary | ICD-10-CM

## 2023-12-11 DIAGNOSIS — F4310 Post-traumatic stress disorder, unspecified: Secondary | ICD-10-CM

## 2023-12-11 DIAGNOSIS — F419 Anxiety disorder, unspecified: Secondary | ICD-10-CM
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
## 2023-12-14 DIAGNOSIS — F331 Major depressive disorder, recurrent, moderate: Secondary | ICD-10-CM | POA: Diagnosis not present

## 2023-12-14 DIAGNOSIS — Z0000 Encounter for general adult medical examination without abnormal findings: Secondary | ICD-10-CM | POA: Diagnosis not present

## 2023-12-14 DIAGNOSIS — R7303 Prediabetes: Secondary | ICD-10-CM | POA: Diagnosis not present

## 2023-12-14 DIAGNOSIS — K219 Gastro-esophageal reflux disease without esophagitis: Secondary | ICD-10-CM | POA: Diagnosis not present

## 2023-12-14 DIAGNOSIS — I700 Atherosclerosis of aorta: Secondary | ICD-10-CM | POA: Diagnosis not present

## 2023-12-14 DIAGNOSIS — G4733 Obstructive sleep apnea (adult) (pediatric): Secondary | ICD-10-CM | POA: Diagnosis not present

## 2023-12-14 DIAGNOSIS — E034 Atrophy of thyroid (acquired): Secondary | ICD-10-CM | POA: Diagnosis not present

## 2023-12-14 DIAGNOSIS — Z1331 Encounter for screening for depression: Secondary | ICD-10-CM | POA: Diagnosis not present

## 2023-12-14 DIAGNOSIS — C50919 Malignant neoplasm of unspecified site of unspecified female breast: Secondary | ICD-10-CM | POA: Diagnosis not present

## 2023-12-14 DIAGNOSIS — N390 Urinary tract infection, site not specified: Secondary | ICD-10-CM | POA: Diagnosis not present

## 2023-12-14 DIAGNOSIS — C7802 Secondary malignant neoplasm of left lung: Secondary | ICD-10-CM | POA: Diagnosis not present

## 2023-12-14 DIAGNOSIS — I10 Essential (primary) hypertension: Secondary | ICD-10-CM | POA: Diagnosis not present

## 2023-12-14 DIAGNOSIS — E785 Hyperlipidemia, unspecified: Secondary | ICD-10-CM | POA: Diagnosis not present

## 2023-12-17 ENCOUNTER — Encounter: Payer: Self-pay | Admitting: Psychiatry

## 2023-12-17 ENCOUNTER — Ambulatory Visit: Admitting: Psychiatry

## 2023-12-17 ENCOUNTER — Other Ambulatory Visit: Payer: Self-pay

## 2023-12-17 VITALS — BP 152/79 | HR 79 | Temp 97.7°F | Ht 64.75 in | Wt 169.0 lb

## 2023-12-17 DIAGNOSIS — F3342 Major depressive disorder, recurrent, in full remission: Secondary | ICD-10-CM

## 2023-12-17 DIAGNOSIS — F418 Other specified anxiety disorders: Secondary | ICD-10-CM | POA: Diagnosis not present

## 2023-12-17 DIAGNOSIS — F4310 Post-traumatic stress disorder, unspecified: Secondary | ICD-10-CM

## 2023-12-17 DIAGNOSIS — F5101 Primary insomnia: Secondary | ICD-10-CM

## 2023-12-17 MED ORDER — BUSPIRONE HCL 10 MG PO TABS
10.0000 mg | ORAL_TABLET | Freq: Three times a day (TID) | ORAL | 3 refills | Status: AC
Start: 2023-12-17 — End: ?

## 2023-12-17 MED ORDER — DULOXETINE HCL 60 MG PO CPEP: 60.0000 mg | ORAL_CAPSULE | Freq: Every morning | ORAL | 3 refills | Status: AC

## 2023-12-17 MED ORDER — TRAZODONE HCL 150 MG PO TABS: 150.0000 mg | ORAL_TABLET | Freq: Every day | ORAL | 3 refills | Status: AC

## 2023-12-17 MED ORDER — HYDROXYZINE HCL 10 MG PO TABS: 10.0000 mg | ORAL_TABLET | Freq: Two times a day (BID) | ORAL | 5 refills | Status: AC | PRN

## 2023-12-17 NOTE — Progress Notes (Signed)
 [facility] MD OP Progress Note  12/17/2023 9:50 AM Breanna Zhang  MRN:  [id number]  Chief Complaint:  Chief Complaint  Patient presents with   Follow-up   Anxiety   Depression   Medication Refill   Discussed the use of AI scribe software for clinical note transcription with the patient, who gave verbal consent to proceed.  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  Recently, she reports feeling mentally better and describes improvement since starting to see Almarie for counseling. She states that talking with Almarie helps her process worries and upsets, and she feels less concerned about offending family members when discussing her feelings. Ongoing stress related to family dynamics, particularly feeling misunderstood by family members regarding her emotions about personal matters such as her car, continues to affect her. She describes a tendency to worry and get upset about things but feels hopeful that her nerves will continue to improve with ongoing support.  She denies any suicidality, homicidality or perceptual disturbances.  Compliant on medications as prescribed and denies side effects.  She takes her medications as prescribed and denies any problems with her current regimen. She continues to take trazodone  for sleep and her pain medication as part of her routine. She denies receiving any new medications from other doctors since her last visit. Regarding sleep, she describes doing the best I can, noting that pain in her legs and hip  sometimes interferes with sleep, but otherwise she feels her sleep is adequate.  Occasional episodes of feeling nervous and going all to pieces occur. She also notes some improvement in her memory, stating it has been better recently, though she previously struggled to remember things and sometimes feels frustrated by forgetfulness. She uses a grand pad device to access online content, including Bible reading and games, but expresses little interest in other reading or new hobbies at this time.  She lives with her husband. Three daughters visit on Saturday mornings to help with cleaning and spend time together.   Visit Diagnosis:    ICD-10-CM   1. MDD (major depressive disorder), recurrent, in full remission  F33.42 DULoxetine  (CYMBALTA ) 60 MG capsule    2. Post traumatic stress disorder (PTSD)  F43.10 busPIRone  (BUSPAR ) 10 MG tablet    DULoxetine  (CYMBALTA ) 60 MG capsule    hydrOXYzine  (ATARAX ) 10 MG tablet    3. Primary insomnia  F51.01 traZODone  (DESYREL ) 150 MG tablet    4. Other specified anxiety disorders  F41.8    Generalized anxiety not occurring more days than not      Past Psychiatric History: Reviewed past psychiatric history from progress note on 04/11/2021.  Past Medical History:  Past Medical History:  Diagnosis Date   Anxiety    Atrial fibrillation (HCC)    Breast cancer (HCC)    Breast cancer, right (HCC) 1999   RT MASTECTOMY and chemo tx's.    DDD (degenerative disc disease), cervical    hands and back with arthritis   Depression    Diverticulosis    Diverticulosis    DVT of lower extremity (deep venous thrombosis) (HCC)    Dyspnea    Family history of  colon cancer    Family history of lung cancer    Family history of throat cancer    GERD (gastroesophageal reflux disease)    Goiter    Headache    History of chemotherapy 2000   BREAST CA   Hypertension    Incontinence    Neuromuscular disorder (HCC)    peipheral neuropathy   OSA (obstructive sleep  apnea)    Osteoporosis    Personal history of chemotherapy 1999   BREAST CA   Status post chemotherapy    2000 right breast cancer   Thyroid  disease    UTI (urinary tract infection)    Vertigo     Past Surgical History:  Procedure Laterality Date   ABDOMINAL HYSTERECTOMY     BREAST BIOPSY Left 2006   negative   BUNIONECTOMY Bilateral    Screws implanted.   CHOLECYSTECTOMY     COCHLEAR IMPLANT Right    COLONOSCOPY WITH PROPOFOL  N/A 07/24/2016   Procedure: COLONOSCOPY WITH PROPOFOL ;  Surgeon: Viktoria Lamar DASEN, MD;  Location: [facility] ENDOSCOPY;  Service: Endoscopy;  Laterality: N/A;   ESOPHAGOGASTRODUODENOSCOPY (EGD) WITH PROPOFOL  N/A 04/12/2016   Procedure: ESOPHAGOGASTRODUODENOSCOPY (EGD) WITH PROPOFOL ;  Surgeon: Lamar DASEN Viktoria, MD;  Location: [facility] ENDOSCOPY;  Service: Endoscopy;  Laterality: N/A;   ESOPHAGOGASTRODUODENOSCOPY (EGD) WITH PROPOFOL  N/A 05/03/2022   Procedure: ESOPHAGOGASTRODUODENOSCOPY (EGD) WITH PROPOFOL ;  Surgeon: Toledo, Ladell POUR, MD;  Location: [facility] ENDOSCOPY;  Service: Gastroenterology;  Laterality: N/A;   EYE SURGERY Bilateral    cataract   FOOT SURGERY Bilateral    HAND SURGERY Right    KYPHOPLASTY N/A 05/20/2020   Procedure: L3 KYPHOPLASTY;  Surgeon: Kathlynn Sharper, MD;  Location: [facility] ORS;  Service: Orthopedics;  Laterality: N/A;   MASTECTOMY Right 1999   BREAST CA   MASTECTOMY Right 1999   PARTIAL COLECTOMY     THYROIDECTOMY     TOTAL HIP ARTHROPLASTY Left 03/19/2018   Procedure: TOTAL HIP ARTHROPLASTY LEFT;  Surgeon: Kathlynn Sharper, MD;  Location: [facility] ORS;  Service: Orthopedics;  Laterality: Left;   VIDEO ASSISTED THORACOSCOPY Left 09/10/2017   Procedure: VIDEO ASSISTED THORACOSCOPY;  Surgeon: Volney Lye, MD;  Location: [facility] ORS;  Service: Thoracic;  Laterality: Left;  with biopsies   VIDEO BRONCHOSCOPY  09/10/2017   Procedure: VIDEO BRONCHOSCOPY;  Surgeon: Volney Lye, MD;  Location: [facility] ORS;  Service: Thoracic;;    Family Psychiatric History: I have  reviewed family psychiatric history from progress note on 04/11/2021.  Family History:  Family History  Problem Relation Age of Onset   Dementia Mother    Dementia Sister    Diabetes Sister    Heart attack Brother 36   COPD Brother    Lung cancer Brother        hx smoking   Colon cancer Brother    Liver cancer Brother    COPD Brother    Lung cancer Brother        hx smoking   Colon cancer Maternal Grandfather        dx >.50   Breast cancer Neg Hx     Social History:  Social History   Socioeconomic History   Marital status: Married    Spouse name: evert   Number of children: 4   Years of education: Not on file   Highest education level: 9th grade  Occupational History   Not on file  Tobacco Use   Smoking status: Never   Smokeless tobacco: Never  Vaping Use   Vaping status: Never Used  Substance and Sexual Activity   Alcohol  use: No    Alcohol /week: 0.0 standard drinks of alcohol    Drug use: No   Sexual activity: Not Currently  Other Topics Concern   Not on file  Social History Narrative   Not on file   Social Drivers of Health   Financial Resource Strain: Low Risk  (12/14/2023)   [facility]   Overall Financial Resource Strain (CARDIA)    Difficulty of Paying Living Expenses: Not hard at all  Recent Concern: Financial Resource Strain - High Risk (12/11/2023)   Overall Financial Resource Strain (CARDIA)    Difficulty of Paying Living Expenses: Hard  Food Insecurity: No Food Insecurity (12/14/2023)   Received from [facility]   Hunger Vital Sign    Within the past 12 months, you worried that your food would run out before you got the money to buy more.: Never true    Within the past 12 months, the food you bought just didn't last and you didn't have money to get more.: Never true  Transportation Needs: No Transportation Needs (12/14/2023)   Received from [facility] - Transportation     In the past 12 months, has lack of transportation kept you from medical appointments or from getting medications?: No    Lack of Transportation (Non-Medical): No  Recent Concern: Transportation Needs - Unmet Transportation Needs (12/11/2023)   PRAPARE - Transportation    Lack of Transportation (Medical): Yes    Lack of Transportation (Non-Medical): Yes  Physical Activity: Inactive (12/11/2023)   Exercise Vital Sign    Days of Exercise per Week: 0 days    Minutes of Exercise per Session: 0 min  Stress: Stress Concern Present (12/11/2023)   [employer] of Occupational Health - Occupational Stress Questionnaire    Feeling of Stress: Rather much  Social Connections: Moderately Integrated (12/11/2023)   Social Connection and Isolation Panel    Frequency of Communication with Friends and Family: More than three times a week    Frequency of Social Gatherings with Friends and Family: Once a week    Attends Religious Services: More than 4 times per year    Active Member of [employer] or Organizations: No    Attends Banker Meetings: Never    Marital Status: Married    Allergies:  Allergies  Allergen Reactions   Imipramine Tinitus   Latex Rash   Penicillins Rash   Tape Rash    Metabolic Disorder Labs: No results found for: HGBA1C, MPG No results found for: PROLACTIN No results found for: CHOL, TRIG, HDL, CHOLHDL, VLDL, LDLCALC Lab Results  Component Value Date   TSH 0.275 (L) 11/09/2012   TSH 0.443 (L) 10/12/2012    Therapeutic Level Labs: No results found for: LITHIUM No results found for: VALPROATE No results found for: CBMZ  Current Medications: Current Outpatient Medications  Medication Sig Dispense Refill   ipratropium (ATROVENT) 0.03 % nasal spray Place 2 sprays into the nose.     acetaminophen  (TYLENOL ) 325 MG tablet Take 650 mg by mouth every 4 (four) hours as needed. For pain / increased temp.  May be administered orally,  per  G-Tube if needed or rectally if unable to swallow (separate order).  Maximum dose for 24 hours is 3,000 mg from all sources of Acetaminophen  / Tylenol      atorvastatin  (LIPITOR) 40 MG tablet Take 1 tablet (40 mg total) by mouth at bedtime. 30 tablet  0   Baclofen 5 MG TABS Take 1 tablet by mouth 2 (two) times daily.     busPIRone  (BUSPAR ) 10 MG tablet Take 1 tablet (10 mg total) by mouth 3 (three) times daily. 270 tablet 3   cholecalciferol  (VITAMIN D ) 1000 units tablet Take 1,000 Units by mouth daily.     DULoxetine  (CYMBALTA ) 60 MG capsule Take 1 capsule (60 mg total) by mouth every morning. 90 capsule 3   gabapentin  (NEURONTIN ) 300 MG capsule Take 300 mg by mouth 2 (two) times daily.     HYDROcodone -acetaminophen  (NORCO/VICODIN) 5-325 MG tablet Take by mouth. Takes 7.5-325 mg     hydrOXYzine  (ATARAX ) 10 MG tablet Take 1 tablet (10 mg total) by mouth 2 (two) times daily as needed for anxiety. 60 tablet 5   ibandronate  (BONIVA ) 150 MG tablet Take 1 tablet (150 mg total) by mouth every 30 (thirty) days. Take in the morning with a full glass of water, on an empty stomach, and do not take anything else by mouth or lie down for the next 30 min. 3 tablet 3   Lactulose  20 GM/30ML SOLN Take 15 mLs (10 g total) by mouth every 12 (twelve) hours as needed. 450 mL 0   letrozole  (FEMARA ) 2.5 MG tablet Take 1 tablet (2.5 mg total) by mouth daily. 90 tablet 0   levothyroxine  (SYNTHROID ) 100 MCG tablet Take 1 tablet by mouth daily.     ondansetron  (ZOFRAN ) 4 MG tablet Take 1 tablet (4 mg total) by mouth every 6 (six) hours as needed for nausea or vomiting. 30 tablet 0   pantoprazole  (PROTONIX ) 40 MG tablet Take 1 tablet (40 mg total) by mouth 2 (two) times daily. 60 tablet 0   traZODone  (DESYREL ) 150 MG tablet Take 1 tablet (150 mg total) by mouth at bedtime. Stop the 100 mg daily 90 tablet 3   triamcinolone  cream (KENALOG ) 0.1 % SMARTSIG:1 Application Topical 2-3 Times Daily     Vibegron  (GEMTESA ) 75 MG TABS Take  1 tablet (75 mg total) by mouth daily. (Patient not taking: Reported on 11/13/2023) 30 tablet 11   vitamin B-12 (CYANOCOBALAMIN ) 500 MCG tablet Take 500 mcg by mouth daily.      No current facility-administered medications for this visit.     Musculoskeletal: Strength & Muscle Tone: within normal limits Gait & Station: walks with walker Patient leans: Front  Psychiatric Specialty Exam: Review of Systems  Psychiatric/Behavioral:  Positive for sleep disturbance (Improving). The patient is nervous/anxious.     Blood pressure (!) 152/79, pulse 79, temperature 97.7 F (36.5 C), temperature source Temporal, height 5' 4.75 (1.645 m), weight 169 lb (76.7 kg).Body mass index is 28.34 kg/m.  General Appearance: Casual  Eye Contact:  Fair  Speech:  Clear and Coherent  Volume:  Normal  Mood:  Anxious  Affect:  Full Range  Thought Process:  Goal Directed and Descriptions of Associations: Intact  Orientation:  Full (Time, Place, and Person)  Thought Content: Logical   Suicidal Thoughts:  No  Homicidal Thoughts:  No  Memory:  Immediate;   Fair Recent;   Fair Remote;   Fair  Judgement:  Fair  Insight:  Fair  Psychomotor Activity:  Normal  Concentration:  Concentration: Fair and Attention Span: Fair  Recall:  [employer] of Knowledge: Fair  Language: Fair  Akathisia:  No  Handed:  Right  AIMS (if indicated): not done  Assets:  Communication Skills Desire for Improvement Housing Social Support  ADL's:  Intact  Cognition: WNL  Sleep:  Improving   Screenings: GAD-7    Flowsheet Row Office Visit from 12/17/2023 in [facility] Counselor from 12/11/2023 in [facility] Office Visit from 11/15/2022 in [facility] Office Visit from 06/12/2022 in [facility]  Total GAD-7 Score 9 12 2 6    PHQ2-9    Flowsheet Row Office Visit from  12/17/2023 in [facility] Counselor from 12/11/2023 in [facility] Office Visit from 07/19/2023 in [facility] Office Visit from 11/15/2022 in [facility] Office Visit from 06/12/2022 in [facility]  PHQ-2 Total Score 2 2 3  0 0  PHQ-9 Total Score 4 11 8  -- 1   Flowsheet Row Office Visit from 12/17/2023 in [facility] Counselor from 12/11/2023 in [facility] Office Visit from 09/20/2023 in [facility]  C-SSRS RISK CATEGORY Moderate Risk Moderate Risk No Risk     Assessment and Plan: ELIDA HARBIN is a 83 year old Caucasian female presented for a follow-up appointment.  Discussed assessment and plan as noted below.  1. MDD (major depressive disorder), recurrent, in full remission Denies any significant depression symptoms.  Engaged in psychotherapy which has been beneficial. Continue Cymbalta  60 mg daily Continue BuSpar  10 mg 3 times a day  2. Post traumatic stress disorder (PTSD)-improving Reports mood symptoms as improved and is currently engaged in psychotherapy session with Ms. Veva which has been beneficial. Continue Trazodone  150 mg at bedtime Continue Hydroxyzine  10 mg twice a day as needed. Continue CBT  3. Primary insomnia-improving Does have pain which does affect sleep although sleep has improved on the trazodone  higher dosage. Continue sleep hygiene techniques Continue Trazodone  150 mg at bedtime for  4. Other specified anxiety disorders, generalized anxiety not occurring more days than not-improving Reports anxiety symptoms is better managed. Continue CBT Continue Hydroxyzine  10 mg twice a day as needed Continue BuSpar  and Cymbalta  as  prescribed.  Follow-up Follow-up in clinic in 3 to 4 months or sooner if needed.  Patient to engage in psychotherapy in the meantime.  Collaboration of Care: Collaboration of Care: Referral or follow-up with counselor/therapist AEB patient advised to engage in psychotherapy I have reviewed notes per Ms.Gainey dated 12/11/2023  Patient/Guardian was advised Release of Information must be obtained prior to any record release in order to collaborate their care with an outside provider. Patient/Guardian was advised if they have not already done so to contact the registration department to sign all necessary forms in order for us  to release information regarding their care.   Consent: Patient/Guardian gives verbal consent for treatment and assignment of benefits for services provided during this visit. Patient/Guardian expressed understanding and agreed to proceed.  This note was generated in part or whole with voice recognition software. Voice recognition is usually quite accurate but there are transcription errors that can and very often do occur. I apologize for any typographical errors that were not detected and corrected.     Avrohom Mckelvin, MD 12/17/2023, 9:50 AM

## 2023-12-18 ENCOUNTER — Ambulatory Visit: Admitting: Professional Counselor

## 2023-12-18 DIAGNOSIS — F331 Major depressive disorder, recurrent, moderate: Secondary | ICD-10-CM

## 2023-12-18 DIAGNOSIS — F419 Anxiety disorder, unspecified: Secondary | ICD-10-CM | POA: Diagnosis not present

## 2023-12-18 DIAGNOSIS — F4310 Post-traumatic stress disorder, unspecified: Secondary | ICD-10-CM

## 2023-12-18 NOTE — Progress Notes (Unsigned)
  THERAPIST PROGRESS NOTE  Session Time: 9:02 AM - 9:55 AM   Participation Level: Active  Behavioral Response: Well Groomed, Alert, Anxious and Dysphoric  Type of Therapy: Individual Therapy  Treatment Goals addressed: Active Anxiety  LTG: I would know where I got to handle things more and I would be more open-minded than I would ever have been. These two things, not worry. I just let things bottle up inside. If people hurt my feelings, I just hold it in.    Start:  12/18/23    Expected End:  12/16/24     STG: I want to be able to be honest without hurting people's feelings. To improve communication AEB utilizing interpersonal effectiveness skills effectively 3 out of 7 days a week over the next 12 weeks.    STG: To reduce sxs of anxiety AEB reduction in GAD7 scores by using coping skills over the next 12 weeks.    STG: I don't know if I'll ever be able to get over the car. And I found out my nephew killed himself. To reduce impact of negative life experiences AEB processing situations and coming to a place of acceptance over the next 12 weeks.    ProgressTowards Goals: Initial  Interventions: Motivational Interviewing, Supportive, and Other: Coping skills  Summary: SANAA ZILBERMAN is a 83 y.o. female who presents with a history of anxiety, depression, and trauma. She appeared alert and oriented x5. She stated things are the same. She reported her daughter lost her husband back in June and she was very upset last night. Roselle noted how it impacts her as well and she had trouble sleeping last night. She was receptive to grief counseling resources to offer her daughter. Nava engaged in developing her treatment plan. She actively listed to coping skills and was in agreement to start practicing them.   Therapist Response: Conducted session with Erminio. Began session with check-in/update since previous session. Utilized empathetic and reflective listening. Used open-ended questions to  facilitate discussion and summarized Fionna's thoughts/feelings. Provided resources for grief counseling. Developed treatment plan with input from Nenzel on current strengths and needs. Provided psychoeducation on coping skills and encouraged Mellina to practice consistently. Scheduled additional appointment and concluded session.   Suicidal/Homicidal: No  Plan: Return again in 2 weeks.  Diagnosis: Anxiety disorder, unspecified type  Major depressive disorder, recurrent episode, moderate (HCC)  PTSD (post-traumatic stress disorder)  Collaboration of Care: Medication Management AEB chart review  Patient/Guardian was advised Release of Information must be obtained prior to any record release in order to collaborate their care with an outside provider. Patient/Guardian was advised if they have not already done so to contact the registration department to sign all necessary forms in order for us  to release information regarding their care.   Consent: Patient/Guardian gives verbal consent for treatment and assignment of benefits for services provided during this visit. Patient/Guardian expressed understanding and agreed to proceed.   Almarie JONETTA Ligas, [facility] 12/19/2023

## 2023-12-28 DIAGNOSIS — F331 Major depressive disorder, recurrent, moderate: Secondary | ICD-10-CM | POA: Diagnosis not present

## 2023-12-28 DIAGNOSIS — M533 Sacrococcygeal disorders, not elsewhere classified: Secondary | ICD-10-CM | POA: Diagnosis not present

## 2023-12-28 DIAGNOSIS — K219 Gastro-esophageal reflux disease without esophagitis: Secondary | ICD-10-CM | POA: Diagnosis not present

## 2023-12-28 DIAGNOSIS — E034 Atrophy of thyroid (acquired): Secondary | ICD-10-CM | POA: Diagnosis not present

## 2023-12-28 DIAGNOSIS — Z96642 Presence of left artificial hip joint: Secondary | ICD-10-CM | POA: Diagnosis not present

## 2023-12-28 DIAGNOSIS — I10 Essential (primary) hypertension: Secondary | ICD-10-CM | POA: Diagnosis not present

## 2023-12-28 DIAGNOSIS — E785 Hyperlipidemia, unspecified: Secondary | ICD-10-CM | POA: Diagnosis not present

## 2023-12-28 DIAGNOSIS — N390 Urinary tract infection, site not specified: Secondary | ICD-10-CM | POA: Diagnosis not present

## 2023-12-28 DIAGNOSIS — R7303 Prediabetes: Secondary | ICD-10-CM | POA: Diagnosis not present

## 2023-12-28 DIAGNOSIS — I700 Atherosclerosis of aorta: Secondary | ICD-10-CM | POA: Diagnosis not present

## 2023-12-28 DIAGNOSIS — G4733 Obstructive sleep apnea (adult) (pediatric): Secondary | ICD-10-CM | POA: Diagnosis not present

## 2023-12-28 DIAGNOSIS — M51362 Other intervertebral disc degeneration, lumbar region with discogenic back pain and lower extremity pain: Secondary | ICD-10-CM | POA: Diagnosis not present

## 2024-01-02 ENCOUNTER — Ambulatory Visit: Admitting: Professional Counselor

## 2024-01-02 DIAGNOSIS — M48062 Spinal stenosis, lumbar region with neurogenic claudication: Secondary | ICD-10-CM | POA: Diagnosis not present

## 2024-01-02 DIAGNOSIS — M439 Deforming dorsopathy, unspecified: Secondary | ICD-10-CM | POA: Diagnosis not present

## 2024-01-02 DIAGNOSIS — E034 Atrophy of thyroid (acquired): Secondary | ICD-10-CM | POA: Diagnosis not present

## 2024-01-02 DIAGNOSIS — M1611 Unilateral primary osteoarthritis, right hip: Secondary | ICD-10-CM | POA: Diagnosis not present

## 2024-01-02 DIAGNOSIS — Z79899 Other long term (current) drug therapy: Secondary | ICD-10-CM | POA: Diagnosis not present

## 2024-01-02 DIAGNOSIS — M5416 Radiculopathy, lumbar region: Secondary | ICD-10-CM | POA: Diagnosis not present

## 2024-01-11 ENCOUNTER — Encounter: Payer: Self-pay | Admitting: Oncology

## 2024-01-11 DIAGNOSIS — M1611 Unilateral primary osteoarthritis, right hip: Secondary | ICD-10-CM | POA: Diagnosis not present

## 2024-01-11 NOTE — Telephone Encounter (Signed)
 Encounter opened in error.

## 2024-01-16 ENCOUNTER — Ambulatory Visit: Admitting: Professional Counselor

## 2024-01-25 DIAGNOSIS — H353221 Exudative age-related macular degeneration, left eye, with active choroidal neovascularization: Secondary | ICD-10-CM | POA: Diagnosis not present

## 2024-01-25 DIAGNOSIS — H353211 Exudative age-related macular degeneration, right eye, with active choroidal neovascularization: Secondary | ICD-10-CM | POA: Diagnosis not present

## 2024-01-30 ENCOUNTER — Other Ambulatory Visit: Payer: Self-pay | Admitting: Family Medicine

## 2024-01-30 DIAGNOSIS — M1611 Unilateral primary osteoarthritis, right hip: Secondary | ICD-10-CM | POA: Diagnosis not present

## 2024-01-30 DIAGNOSIS — M25551 Pain in right hip: Secondary | ICD-10-CM | POA: Diagnosis not present

## 2024-01-30 DIAGNOSIS — M48062 Spinal stenosis, lumbar region with neurogenic claudication: Secondary | ICD-10-CM | POA: Diagnosis not present

## 2024-01-31 ENCOUNTER — Inpatient Hospital Stay: Admission: RE | Admit: 2024-01-31 | Discharge: 2024-01-31 | Attending: Family Medicine | Admitting: Family Medicine

## 2024-01-31 DIAGNOSIS — M25551 Pain in right hip: Secondary | ICD-10-CM

## 2024-01-31 DIAGNOSIS — M7601 Gluteal tendinitis, right hip: Secondary | ICD-10-CM | POA: Diagnosis not present

## 2024-02-08 DIAGNOSIS — R300 Dysuria: Secondary | ICD-10-CM | POA: Diagnosis not present

## 2024-02-08 DIAGNOSIS — M7600 Gluteal tendinitis, unspecified hip: Secondary | ICD-10-CM | POA: Diagnosis not present

## 2024-02-08 DIAGNOSIS — R39853 Costovertebral (angle) tenderness, bilateral: Secondary | ICD-10-CM | POA: Diagnosis not present

## 2024-02-12 ENCOUNTER — Ambulatory Visit: Payer: Self-pay | Admitting: Physician Assistant

## 2024-02-12 VITALS — BP 178/93 | HR 81 | Ht 66.0 in | Wt 158.0 lb

## 2024-02-12 DIAGNOSIS — N3289 Other specified disorders of bladder: Secondary | ICD-10-CM | POA: Diagnosis not present

## 2024-02-12 DIAGNOSIS — N3281 Overactive bladder: Secondary | ICD-10-CM

## 2024-02-12 DIAGNOSIS — R300 Dysuria: Secondary | ICD-10-CM | POA: Diagnosis not present

## 2024-02-12 DIAGNOSIS — R399 Unspecified symptoms and signs involving the genitourinary system: Secondary | ICD-10-CM | POA: Diagnosis not present

## 2024-02-12 LAB — URINALYSIS, COMPLETE
Bilirubin, UA: NEGATIVE
Glucose, UA: NEGATIVE
Ketones, UA: NEGATIVE
Leukocytes,UA: NEGATIVE
Nitrite, UA: NEGATIVE
Protein,UA: NEGATIVE
RBC, UA: NEGATIVE
Specific Gravity, UA: <1.005 — ABNORMAL LOW (ref 1.005–1.030)
Urobilinogen, Ur: 0.2 mg/dL (ref 0.2–1.0)
pH, UA: 6 (ref 5.0–7.5)

## 2024-02-12 LAB — BLADDER SCAN AMB NON-IMAGING

## 2024-02-12 LAB — MICROSCOPIC EXAMINATION
Bacteria, UA: NONE SEEN
RBC, Urine: NONE SEEN /HPF (ref 0–2)

## 2024-02-12 NOTE — Progress Notes (Unsigned)
 02/12/2024 10:54 AM   Breanna Zhang 07-07-1940 [id number]  CC: Chief Complaint  Patient presents with   Follow-up   Over Active Bladder   HPI: Breanna Zhang is a 83 y.o. female with PMH recurrent UTI, GSM, OAB wet who failed Myrbetriq  due to lost efficacy now on Gemtesa , and metastatic breast cancer s/p right mastectomy and chemotherapy on letrozole  who presents today for annual follow-up.  Today she reports 1 week of RLQ, right hip, and right low back pain of unclear etiology.  She was having some discomfort with voiding, so went to urgent care and was prescribed 7 days of Omnicef for acute cystitis and prednisone  for gluteal tendinitis.  Her symptoms have improved somewhat with antibiotics.  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  She initially denies a history of urethral injections, though when reminded of her longstanding urologic history, having previously seen Dr. Ike and [facility], she admits that she could have had some sort of injectable in the past and forgotten about it.  She is off Gemtesa  due to cost.  She has constipation at baseline.  In-office UA and microscopy pan negative. PVR 86mL.  PMH: Past Medical History:  Diagnosis Date   Anxiety    Atrial fibrillation (HCC)    Breast cancer (HCC)    Breast cancer, right (HCC) 1999   RT MASTECTOMY and chemo tx's.    DDD (degenerative disc disease), cervical    hands and back with arthritis   Depression    Diverticulosis    Diverticulosis    DVT of lower extremity (deep venous thrombosis) (HCC)    Dyspnea    Family history of colon cancer    Family history of lung cancer    Family history of throat cancer    GERD (gastroesophageal reflux disease)    Goiter    Headache    History of chemotherapy 2000   BREAST CA   Hypertension    Incontinence     Neuromuscular disorder (HCC)    peipheral neuropathy   OSA (obstructive sleep apnea)    Osteoporosis    Personal history of chemotherapy 1999   BREAST CA   Status post chemotherapy    2000 right breast cancer   Thyroid  disease    UTI (urinary tract infection)    Vertigo     Surgical History: Past Surgical History:  Procedure Laterality Date   ABDOMINAL HYSTERECTOMY     BREAST BIOPSY Left 2006   negative   BUNIONECTOMY Bilateral    Screws implanted.   CHOLECYSTECTOMY     COCHLEAR IMPLANT Right    COLONOSCOPY WITH PROPOFOL  N/A 07/24/2016   Procedure: COLONOSCOPY WITH PROPOFOL ;  Surgeon: Viktoria Lamar DASEN, MD;  Location: [facility] ENDOSCOPY;  Service: Endoscopy;  Laterality: N/A;   ESOPHAGOGASTRODUODENOSCOPY (EGD) WITH PROPOFOL  N/A 04/12/2016   Procedure: ESOPHAGOGASTRODUODENOSCOPY (EGD) WITH PROPOFOL ;  Surgeon: Lamar DASEN Viktoria, MD;  Location: [facility] ENDOSCOPY;  Service: Endoscopy;  Laterality: N/A;   ESOPHAGOGASTRODUODENOSCOPY (EGD) WITH PROPOFOL  N/A 05/03/2022   Procedure: ESOPHAGOGASTRODUODENOSCOPY (EGD) WITH PROPOFOL ;  Surgeon: Toledo, Ladell MARLA, MD;  Location: [facility] ENDOSCOPY;  Service: Gastroenterology;  Laterality: N/A;   EYE SURGERY Bilateral    cataract   FOOT SURGERY Bilateral    HAND SURGERY Right    KYPHOPLASTY N/A 05/20/2020   Procedure: L3 KYPHOPLASTY;  Surgeon: Kathlynn Sharper, MD;  Location: [facility] ORS;  Service:  Orthopedics;  Laterality: N/A;   MASTECTOMY Right 1999   BREAST CA   MASTECTOMY Right 1999   PARTIAL COLECTOMY     THYROIDECTOMY     TOTAL HIP ARTHROPLASTY Left 03/19/2018   Procedure: TOTAL HIP ARTHROPLASTY LEFT;  Surgeon: Kathlynn Sharper, MD;  Location: [facility] ORS;  Service: Orthopedics;  Laterality: Left;   VIDEO ASSISTED THORACOSCOPY Left 09/10/2017   Procedure: VIDEO ASSISTED THORACOSCOPY;  Surgeon: Volney Lye, MD;  Location: [facility] ORS;  Service: Thoracic;  Laterality: Left;  with biopsies   VIDEO BRONCHOSCOPY  09/10/2017   Procedure: VIDEO BRONCHOSCOPY;  Surgeon:  Volney Lye, MD;  Location: [facility] ORS;  Service: Thoracic;;    Home Medications:  Allergies as of 02/12/2024       Reactions   Imipramine Tinitus   Latex Rash   Penicillins Rash   Tape Rash        Medication List        Accurate as of February 12, 2024 10:54 AM. If you have any questions, ask your nurse or doctor.          acetaminophen  325 MG tablet Commonly known as: TYLENOL  Take 650 mg by mouth every 4 (four) hours as needed. For pain / increased temp.  May be administered orally,  per G-Tube if needed or rectally if unable to swallow (separate order).  Maximum dose for 24 hours is 3,000 mg from all sources of Acetaminophen  / Tylenol    atorvastatin  40 MG tablet Commonly known as: LIPITOR Take 1 tablet (40 mg total) by mouth at bedtime.   Baclofen 5 MG Tabs Take 1 tablet by mouth 2 (two) times daily.   busPIRone  10 MG tablet Commonly known as: BUSPAR  Take 1 tablet (10 mg total) by mouth 3 (three) times daily.   cholecalciferol  1000 units tablet Commonly known as: VITAMIN D  Take 1,000 Units by mouth daily.   cyanocobalamin  500 MCG tablet Commonly known as: VITAMIN B12 Take 500 mcg by mouth daily.   DULoxetine  60 MG capsule Commonly known as: CYMBALTA  Take 1 capsule (60 mg total) by mouth every morning.   gabapentin  300 MG capsule Commonly known as: NEURONTIN  Take 300 mg by mouth 2 (two) times daily.   Gemtesa  75 MG Tabs Generic drug: Vibegron  Take 1 tablet (75 mg total) by mouth daily.   HYDROcodone -acetaminophen  5-325 MG tablet Commonly known as: NORCO/VICODIN Take by mouth. Takes 7.5-325 mg   hydrOXYzine  10 MG tablet Commonly known as: ATARAX  Take 1 tablet (10 mg total) by mouth 2 (two) times daily as needed for anxiety.   ibandronate  150 MG tablet Commonly known as: Boniva  Take 1 tablet (150 mg total) by mouth every 30 (thirty) days. Take in the morning with a full glass of water, on an empty stomach, and do not take anything else by mouth or  lie down for the next 30 min.   ipratropium 0.03 % nasal spray Commonly known as: ATROVENT Place 2 sprays into the nose.   Lactulose  20 GM/30ML Soln Take 15 mLs (10 g total) by mouth every 12 (twelve) hours as needed.   letrozole  2.5 MG tablet Commonly known as: FEMARA  Take 1 tablet (2.5 mg total) by mouth daily.   levothyroxine  100 MCG tablet Commonly known as: SYNTHROID  Take 1 tablet by mouth daily.   ondansetron  4 MG tablet Commonly known as: ZOFRAN  Take 1 tablet (4 mg total) by mouth every 6 (six) hours as needed for nausea or vomiting.   pantoprazole  40 MG tablet Commonly known as: PROTONIX   Take 1 tablet (40 mg total) by mouth 2 (two) times daily.   traZODone  150 MG tablet Commonly known as: DESYREL  Take 1 tablet (150 mg total) by mouth at bedtime. Stop the 100 mg daily   triamcinolone  cream 0.1 % Commonly known as: KENALOG  SMARTSIG:1 Application Topical 2-3 Times Daily        Allergies:  Allergies  Allergen Reactions   Imipramine Tinitus   Latex Rash   Penicillins Rash   Tape Rash    Family History: Family History  Problem Relation Age of Onset   Dementia Mother    Dementia Sister    Diabetes Sister    Heart attack Brother 64   COPD Brother    Lung cancer Brother        hx smoking   Colon cancer Brother    Liver cancer Brother    COPD Brother    Lung cancer Brother        hx smoking   Colon cancer Maternal Grandfather        dx >.50   Breast cancer Neg Hx     Social History:   reports that she has never smoked. She has never used smokeless tobacco. She reports that she does not drink alcohol  and does not use drugs.  Physical Exam: BP (!) 178/93 (BP Location: Left Arm, Patient Position: Sitting, Cuff Size: Normal)   Pulse 81   Ht 5' 6 (1.676 m)   Wt 158 lb (71.7 kg)   SpO2 98%   BMI 25.50 kg/m   Constitutional:  Alert and oriented, no acute distress, nontoxic appearing HEENT: [place], AT Cardiovascular: No clubbing, cyanosis, or  edema Respiratory: Normal respiratory effort, no increased work of breathing Skin: No rashes, bruises or suspicious lesions Neurologic: Grossly intact, no focal deficits, moving all 4 extremities Psychiatric: Normal mood and affect  Laboratory Data: Results for orders placed or performed in visit on 02/12/24  Bladder Scan (Post Void Residual) in office   Collection Time: 02/12/24 10:26 AM  Result Value Ref Range   Scan Result 86ml   Microscopic Examination   Collection Time: 02/12/24 10:27 AM   Urine  Result Value Ref Range   WBC, UA 0-5 0 - 5 /hpf   RBC, Urine None seen 0 - 2 /hpf   Epithelial Cells (non renal) 0-10 0 - 10 /hpf   Bacteria, UA None seen None seen/Few  Urinalysis, Complete   Collection Time: 02/12/24 10:27 AM  Result Value Ref Range   Specific Gravity, UA <1.005 (L) 1.005 - 1.030   pH, UA 6.0 5.0 - 7.5   Color, UA Yellow Yellow   Appearance Ur Clear Clear   Leukocytes,UA Negative Negative   Protein,UA Negative Negative/Trace   Glucose, UA Negative Negative   Ketones, UA Negative Negative   RBC, UA Negative Negative   Bilirubin, UA Negative Negative   Urobilinogen, Ur 0.2 0.2 - 1.0 mg/dL   Nitrite, UA Negative Negative   Microscopic Examination Comment    Microscopic Examination See below:    Pertinent Imaging: CT chest abdomen and pelvis with contrast, 04/09/2023: CLINICAL DATA:  Breast cancer.  Restaging.  * Tracking Code: BO *   EXAM: CT CHEST, ABDOMEN, AND PELVIS WITH CONTRAST   TECHNIQUE: Multidetector CT imaging of the chest, abdomen and pelvis was performed following the standard protocol during bolus administration of intravenous contrast.   RADIATION DOSE REDUCTION: This exam was performed according to the departmental dose-optimization program which includes automated exposure control, adjustment of the  mA and/or kV according to patient size and/or use of iterative reconstruction technique.   CONTRAST:  OMNIPAQUE  IOHEXOL  300 MG/ML   SOLN   COMPARISON:  CT 04/19/2022.  Bone scan 03/20/2023.   FINDINGS: CT CHEST FINDINGS   Cardiovascular: Heart is nonenlarged. Trace pericardial fluid. Coronary calcifications are seen. Calcifications along the mitral valve annulus. Small pericardial effusion. The thoracic aorta is normal course and caliber with scattered atherosclerotic partially calcified scattered plaque. Also some plaque along the left subclavian artery origin.   Mediastinum/Nodes: Moderate hiatal hernia. Slight wall thickening are esophagus. Atrophic thyroid  gland. No abnormal lymph node enlargement identified in the axillary regions, hilum or mediastinum. Surgical clips in the right axillary region. Previous right mastectomy.   Lungs/Pleura: There is some linear opacity seen along the lung bases likely scar or atelectasis. No consolidation, pneumothorax or effusion. Few tiny nodules are identified. Some which are calcified and related to old granulomatous disease. Noncalcified nodules include along the interlobar fissure on the left measuring 4 mm on series 4, image 69 which is unchanged from previous. Additional small focus medial to this as well on series 4, image 68. No new dominant lung nodules identified at this time. Mild bilateral apical pleural thickening.   Musculoskeletal: Mild degenerative changes of the spine with osteopenia. Bridging osteophytes seen.   CT ABDOMEN PELVIS FINDINGS   Hepatobiliary: No focal liver abnormality is seen. Status post cholecystectomy. No biliary dilatation.   Pancreas: Unremarkable. No pancreatic ductal dilatation or surrounding inflammatory changes.   Spleen: Normal in size without focal abnormality.   Adrenals/Urinary Tract: Adrenal glands are preserved. Moderate right and mild left renal atrophy. 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 The ureters have normal course and caliber extending down to the urinary bladder. Bladder  is underdistended. 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 These are unchanged from previous. Please correlate for additional history of possible urethral filler injection.   Stomach/Bowel: Surgical changes along the right side of the colon with partial colectomy. Also lies the large bowel has a redundant course with moderate colonic stool. No obstruction. Few sigmoid colon diverticula. The stomach and small bowel are nondilated.   Vascular/Lymphatic: Aortic atherosclerosis. No enlarged abdominal or pelvic lymph nodes.   Reproductive: Status post hysterectomy. No adnexal masses.   Other: No free air or free fluid.   Musculoskeletal: Streak artifact related to patient's left hip arthroplasty. Scattered degenerative changes of the spine and pelvis. Osteopenia. There is augmentation cement within the L3 vertebral level. The recent bone scan demonstrated some uptake at the L2 level. Today the L2 vertebral body is with some mild superior endplate compression which was not seen on the prior CT scan. There is displacement of the superior endplate posteriorly toward central canal with some canal encroachment. Mild sclerosis in this area. Based on appearance favor a benign compression but there is a differential in the setting of breast cancer.   IMPRESSION: New compression of the superior endplate of L2 with some canal encroachment compared to the CT scan of February 2024. However there is a report of a lumbar spine x-ray from 10/10/2022 from outside institution describing L2 compression deformity. Please correlate with any known injury or specific history. This would correspond to the abnormal uptake on bone scan.   Otherwise no significant oval change. No developing new mass lesion, fluid collection or lymph node enlargement.   Moderate hiatal hernia.   Cervical surgical  changes from right  mastectomy, axillary lymph node dissection. Separate partial right hemicolectomy.     Electronically Signed   By: Ranell Bring M.D.   On: 04/12/2023 18:55  I personally reviewed the images referenced above and note periurethral calcification.  Assessment & Plan:   1. OAB (overactive bladder) (Primary) Emptying appropriately, though now off Gemtesa .  She has now failed both beta 3 agonist due to either cost or lost efficacy and is a poor candidate for antimuscarinics due to her age and baseline constipation.  Will have her follow-up with Dr. MacDiarmid to discuss alternatives. - Bladder Scan (Post Void Residual) in office  2. Dysuria UA today is bland, okay to complete Omnicef. - Urinalysis, Complete  3. Calcification of bladder Periurethral calcifications on recent CT.  I was honest with her that I do not think this caused her recent back/hip/abdominal pain.  Question calcified urethral injectable.  Will have her follow-up with Dr. MacDiarmid to discuss further for consideration of additional imaging versus cystoscopy.  Return in about 6 weeks (around 03/25/2024) for OAB visit with Dr. Gaston.  Lucie Hones, PA-C  [facility] [place] [address], [address] [place], [place] [address] [phone]

## 2024-02-23 ENCOUNTER — Other Ambulatory Visit: Payer: Self-pay | Admitting: Oncology

## 2024-02-25 ENCOUNTER — Encounter: Payer: Self-pay | Admitting: Oncology

## 2024-03-12 ENCOUNTER — Telehealth: Payer: Self-pay | Admitting: Oncology

## 2024-03-12 NOTE — Telephone Encounter (Signed)
 Pt called and left vm asking for return call - called pt back and left vm - [facility]

## 2024-03-20 ENCOUNTER — Telehealth: Payer: Self-pay | Admitting: Oncology

## 2024-03-20 ENCOUNTER — Ambulatory Visit: Admitting: Psychiatry

## 2024-03-20 NOTE — Telephone Encounter (Signed)
 Called pt to confirm CT appt for 1/13 - pt confirmed date/time/location - [facility]

## 2024-03-25 ENCOUNTER — Ambulatory Visit
Admission: RE | Admit: 2024-03-25 | Discharge: 2024-03-25 | Disposition: A | Discharge status: Home / Self Care | Source: Ambulatory Visit | Attending: Oncology | Admitting: Oncology

## 2024-03-25 DIAGNOSIS — Z9011 Acquired absence of right breast and nipple: Secondary | ICD-10-CM | POA: Insufficient documentation

## 2024-03-25 DIAGNOSIS — I700 Atherosclerosis of aorta: Secondary | ICD-10-CM | POA: Insufficient documentation

## 2024-03-25 DIAGNOSIS — Z9049 Acquired absence of other specified parts of digestive tract: Secondary | ICD-10-CM | POA: Insufficient documentation

## 2024-03-25 DIAGNOSIS — Z5181 Encounter for therapeutic drug level monitoring: Secondary | ICD-10-CM | POA: Insufficient documentation

## 2024-03-25 DIAGNOSIS — K5730 Diverticulosis of large intestine without perforation or abscess without bleeding: Secondary | ICD-10-CM | POA: Insufficient documentation

## 2024-03-25 DIAGNOSIS — C50919 Malignant neoplasm of unspecified site of unspecified female breast: Secondary | ICD-10-CM | POA: Insufficient documentation

## 2024-03-25 DIAGNOSIS — C50911 Malignant neoplasm of unspecified site of right female breast: Secondary | ICD-10-CM | POA: Insufficient documentation

## 2024-03-25 DIAGNOSIS — Z79811 Long term (current) use of aromatase inhibitors: Secondary | ICD-10-CM | POA: Diagnosis present

## 2024-03-25 DIAGNOSIS — R918 Other nonspecific abnormal finding of lung field: Secondary | ICD-10-CM | POA: Diagnosis not present

## 2024-03-25 MED ORDER — IOHEXOL 300 MG/ML  SOLN
100.0000 mL | Freq: Once | INTRAMUSCULAR | Status: AC | PRN
  Administered 2024-03-25: 100 mL via INTRAVENOUS

## 2024-04-02 ENCOUNTER — Telehealth: Payer: Self-pay | Admitting: *Deleted

## 2024-04-02 ENCOUNTER — Other Ambulatory Visit: Payer: Self-pay | Admitting: Oncology

## 2024-04-02 NOTE — Telephone Encounter (Signed)
 Patient called triage. She has reviewed her ct scan results on mychart and saw that she has new bilateral pulmonary nodules that may be suspicious for mets. She would like a sooner apt with Dr. Melanee to discuss the results and plan.  Her apt with Dr. Melanee is currently scheduled for 2/27

## 2024-04-07 ENCOUNTER — Ambulatory Visit: Admitting: Urology

## 2024-04-10 ENCOUNTER — Inpatient Hospital Stay: Attending: Oncology

## 2024-04-16 ENCOUNTER — Inpatient Hospital Stay

## 2024-04-16 ENCOUNTER — Inpatient Hospital Stay: Admitting: Oncology

## 2024-04-17 ENCOUNTER — Ambulatory Visit: Admitting: Psychiatry

## 2024-05-07 ENCOUNTER — Inpatient Hospital Stay: Admitting: Oncology

## 2024-05-07 ENCOUNTER — Inpatient Hospital Stay

## 2024-05-09 ENCOUNTER — Other Ambulatory Visit

## 2024-05-09 ENCOUNTER — Ambulatory Visit: Admitting: Oncology

## 2024-05-16 ENCOUNTER — Ambulatory Visit: Admitting: Oncology

## 2024-05-16 ENCOUNTER — Other Ambulatory Visit

## 2024-06-23 ENCOUNTER — Ambulatory Visit: Admitting: Psychiatry
